# Patient Record
Sex: Female | Born: 1980 | Race: White | Hispanic: No | Smoking: Never smoker
Health system: Southern US, Community
[De-identification: ages and names within clinical notes are randomized; demographics above are authoritative.]

## PROBLEM LIST (undated history)

## (undated) DIAGNOSIS — F419 Anxiety disorder, unspecified: Secondary | ICD-10-CM

## (undated) DIAGNOSIS — G4730 Sleep apnea, unspecified: Secondary | ICD-10-CM

## (undated) DIAGNOSIS — R51 Headache: Secondary | ICD-10-CM

## (undated) DIAGNOSIS — E785 Hyperlipidemia, unspecified: Secondary | ICD-10-CM

## (undated) DIAGNOSIS — I10 Essential (primary) hypertension: Secondary | ICD-10-CM

## (undated) DIAGNOSIS — R519 Headache, unspecified: Secondary | ICD-10-CM

## (undated) DIAGNOSIS — E1159 Type 2 diabetes mellitus with other circulatory complications: Secondary | ICD-10-CM

## (undated) DIAGNOSIS — K219 Gastro-esophageal reflux disease without esophagitis: Secondary | ICD-10-CM

## (undated) DIAGNOSIS — Z794 Long term (current) use of insulin: Secondary | ICD-10-CM

## (undated) DIAGNOSIS — F32A Depression, unspecified: Secondary | ICD-10-CM

## (undated) DIAGNOSIS — N289 Disorder of kidney and ureter, unspecified: Secondary | ICD-10-CM

## (undated) DIAGNOSIS — E119 Type 2 diabetes mellitus without complications: Secondary | ICD-10-CM

## (undated) DIAGNOSIS — I152 Hypertension secondary to endocrine disorders: Secondary | ICD-10-CM

## (undated) DIAGNOSIS — M1990 Unspecified osteoarthritis, unspecified site: Secondary | ICD-10-CM

## (undated) DIAGNOSIS — M419 Scoliosis, unspecified: Secondary | ICD-10-CM

## (undated) HISTORY — PX: OTHER SURGICAL HISTORY: SHX169

## (undated) HISTORY — PX: LUMBAR FUSION: SHX111

## (undated) HISTORY — DX: Depression, unspecified: F32.A

## (undated) HISTORY — PX: HIP SURGERY: SHX245

## (undated) HISTORY — DX: Anxiety disorder, unspecified: F41.9

## (undated) HISTORY — PX: CERVICAL FUSION: SHX112

## (undated) HISTORY — PX: BACK SURGERY: SHX140

---

## 1999-12-12 ENCOUNTER — Encounter: Payer: Self-pay | Admitting: Neurosurgery

## 1999-12-12 ENCOUNTER — Encounter: Admission: RE | Admit: 1999-12-12 | Discharge: 1999-12-12 | Payer: Self-pay | Admitting: Neurosurgery

## 1999-12-19 ENCOUNTER — Encounter: Payer: Self-pay | Admitting: Neurosurgery

## 1999-12-19 ENCOUNTER — Ambulatory Visit (HOSPITAL_COMMUNITY): Admission: RE | Admit: 1999-12-19 | Discharge: 1999-12-19 | Payer: Self-pay | Admitting: Neurosurgery

## 2000-01-29 ENCOUNTER — Encounter: Payer: Self-pay | Admitting: Neurosurgery

## 2000-02-02 ENCOUNTER — Inpatient Hospital Stay (HOSPITAL_COMMUNITY): Admission: RE | Admit: 2000-02-02 | Discharge: 2000-02-06 | Payer: Self-pay | Admitting: Neurosurgery

## 2000-03-14 ENCOUNTER — Encounter: Admission: RE | Admit: 2000-03-14 | Discharge: 2000-03-14 | Payer: Self-pay | Admitting: Infectious Diseases

## 2001-12-13 ENCOUNTER — Emergency Department (HOSPITAL_COMMUNITY): Admission: EM | Admit: 2001-12-13 | Discharge: 2001-12-13 | Payer: Self-pay | Admitting: Emergency Medicine

## 2002-11-17 ENCOUNTER — Emergency Department (HOSPITAL_COMMUNITY): Admission: EM | Admit: 2002-11-17 | Discharge: 2002-11-17 | Payer: Self-pay | Admitting: Emergency Medicine

## 2004-11-28 ENCOUNTER — Emergency Department (HOSPITAL_COMMUNITY): Admission: EM | Admit: 2004-11-28 | Discharge: 2004-11-29 | Payer: Self-pay | Admitting: Emergency Medicine

## 2005-02-24 ENCOUNTER — Emergency Department (HOSPITAL_COMMUNITY): Admission: EM | Admit: 2005-02-24 | Discharge: 2005-02-24 | Payer: Self-pay | Admitting: Family Medicine

## 2005-06-14 ENCOUNTER — Ambulatory Visit: Payer: Self-pay | Admitting: Family Medicine

## 2006-02-25 ENCOUNTER — Emergency Department (HOSPITAL_COMMUNITY): Admission: EM | Admit: 2006-02-25 | Discharge: 2006-02-25 | Payer: Self-pay | Admitting: Emergency Medicine

## 2006-04-28 ENCOUNTER — Ambulatory Visit (HOSPITAL_COMMUNITY): Admission: RE | Admit: 2006-04-28 | Discharge: 2006-04-28 | Payer: Self-pay | Admitting: Obstetrics & Gynecology

## 2006-06-13 ENCOUNTER — Ambulatory Visit (HOSPITAL_COMMUNITY): Admission: RE | Admit: 2006-06-13 | Discharge: 2006-06-13 | Payer: Self-pay | Admitting: Obstetrics & Gynecology

## 2006-07-04 ENCOUNTER — Encounter (INDEPENDENT_AMBULATORY_CARE_PROVIDER_SITE_OTHER): Payer: Self-pay | Admitting: *Deleted

## 2006-07-04 ENCOUNTER — Ambulatory Visit (HOSPITAL_COMMUNITY): Admission: RE | Admit: 2006-07-04 | Discharge: 2006-07-04 | Payer: Self-pay | Admitting: Obstetrics & Gynecology

## 2006-07-12 ENCOUNTER — Inpatient Hospital Stay (HOSPITAL_COMMUNITY): Admission: AD | Admit: 2006-07-12 | Discharge: 2006-07-14 | Payer: Self-pay | Admitting: Obstetrics

## 2006-11-11 ENCOUNTER — Ambulatory Visit: Payer: Self-pay | Admitting: Family Medicine

## 2006-12-23 ENCOUNTER — Ambulatory Visit: Payer: Self-pay | Admitting: Family Medicine

## 2006-12-25 ENCOUNTER — Ambulatory Visit: Payer: Self-pay | Admitting: Family Medicine

## 2007-01-31 ENCOUNTER — Inpatient Hospital Stay (HOSPITAL_COMMUNITY): Admission: AD | Admit: 2007-01-31 | Discharge: 2007-02-01 | Payer: Self-pay | Admitting: Obstetrics & Gynecology

## 2007-04-13 ENCOUNTER — Encounter (INDEPENDENT_AMBULATORY_CARE_PROVIDER_SITE_OTHER): Payer: Self-pay | Admitting: Family Medicine

## 2007-04-17 ENCOUNTER — Encounter: Admission: RE | Admit: 2007-04-17 | Discharge: 2007-04-17 | Payer: Self-pay | Admitting: Neurosurgery

## 2007-06-19 ENCOUNTER — Encounter (INDEPENDENT_AMBULATORY_CARE_PROVIDER_SITE_OTHER): Payer: Self-pay | Admitting: Family Medicine

## 2007-10-19 ENCOUNTER — Emergency Department (HOSPITAL_COMMUNITY): Admission: EM | Admit: 2007-10-19 | Discharge: 2007-10-19 | Payer: Self-pay | Admitting: Emergency Medicine

## 2007-11-05 ENCOUNTER — Emergency Department (HOSPITAL_COMMUNITY): Admission: EM | Admit: 2007-11-05 | Discharge: 2007-11-05 | Payer: Self-pay | Admitting: Family Medicine

## 2007-12-22 ENCOUNTER — Telehealth (INDEPENDENT_AMBULATORY_CARE_PROVIDER_SITE_OTHER): Payer: Self-pay | Admitting: Family Medicine

## 2008-01-25 ENCOUNTER — Encounter
Admission: RE | Admit: 2008-01-25 | Discharge: 2008-01-25 | Payer: Self-pay | Admitting: Physical Medicine and Rehabilitation

## 2008-02-17 ENCOUNTER — Encounter (INDEPENDENT_AMBULATORY_CARE_PROVIDER_SITE_OTHER): Payer: Self-pay | Admitting: Family Medicine

## 2008-03-18 ENCOUNTER — Telehealth (INDEPENDENT_AMBULATORY_CARE_PROVIDER_SITE_OTHER): Payer: Self-pay | Admitting: Family Medicine

## 2008-04-18 ENCOUNTER — Ambulatory Visit: Payer: Self-pay | Admitting: Family Medicine

## 2008-04-18 ENCOUNTER — Telehealth (INDEPENDENT_AMBULATORY_CARE_PROVIDER_SITE_OTHER): Payer: Self-pay | Admitting: *Deleted

## 2008-04-18 DIAGNOSIS — F329 Major depressive disorder, single episode, unspecified: Secondary | ICD-10-CM | POA: Insufficient documentation

## 2008-04-18 DIAGNOSIS — F32A Depression, unspecified: Secondary | ICD-10-CM | POA: Insufficient documentation

## 2008-04-18 DIAGNOSIS — E119 Type 2 diabetes mellitus without complications: Secondary | ICD-10-CM | POA: Insufficient documentation

## 2008-04-18 DIAGNOSIS — E1165 Type 2 diabetes mellitus with hyperglycemia: Secondary | ICD-10-CM

## 2008-04-18 DIAGNOSIS — E1129 Type 2 diabetes mellitus with other diabetic kidney complication: Secondary | ICD-10-CM | POA: Insufficient documentation

## 2008-04-18 DIAGNOSIS — I10 Essential (primary) hypertension: Secondary | ICD-10-CM

## 2008-04-18 DIAGNOSIS — F332 Major depressive disorder, recurrent severe without psychotic features: Secondary | ICD-10-CM | POA: Insufficient documentation

## 2008-04-18 LAB — CONVERTED CEMR LAB
ALT: 53 units/L — ABNORMAL HIGH (ref 0–35)
AST: 33 units/L (ref 0–37)
Albumin: 4.5 g/dL (ref 3.5–5.2)
Alkaline Phosphatase: 62 units/L (ref 39–117)
BUN: 13 mg/dL (ref 6–23)
Basophils Relative: 0 % (ref 0–1)
Blood Glucose, Fingerstick: 271
CO2: 23 meq/L (ref 19–32)
Calcium: 9.3 mg/dL (ref 8.4–10.5)
Eosinophils Absolute: 0.1 10*3/uL (ref 0.0–0.7)
Eosinophils Relative: 2 % (ref 0–5)
HDL: 41 mg/dL (ref >39)
Hemoglobin: 14.1 g/dL (ref 12.0–15.0)
Hgb A1c MFr Bld: 9 %
Lymphs Abs: 2.6 10*3/uL (ref 0.7–4.0)
MCV: 92.2 fL (ref 78.0–100.0)
Monocytes Absolute: 0.4 10*3/uL (ref 0.1–1.0)
Monocytes Relative: 7 % (ref 3–12)
Neutro Abs: 3 10*3/uL (ref 1.7–7.7)
Potassium: 4.5 meq/L (ref 3.5–5.3)

## 2008-05-02 ENCOUNTER — Emergency Department (HOSPITAL_COMMUNITY): Admission: EM | Admit: 2008-05-02 | Discharge: 2008-05-02 | Payer: Self-pay | Admitting: Emergency Medicine

## 2008-05-06 ENCOUNTER — Encounter (INDEPENDENT_AMBULATORY_CARE_PROVIDER_SITE_OTHER): Payer: Self-pay | Admitting: Family Medicine

## 2008-05-10 ENCOUNTER — Encounter (INDEPENDENT_AMBULATORY_CARE_PROVIDER_SITE_OTHER): Payer: Self-pay | Admitting: Family Medicine

## 2008-05-10 ENCOUNTER — Ambulatory Visit (HOSPITAL_COMMUNITY): Admission: RE | Admit: 2008-05-10 | Discharge: 2008-05-10 | Payer: Self-pay | Admitting: Obstetrics & Gynecology

## 2008-05-17 ENCOUNTER — Ambulatory Visit: Payer: Self-pay | Admitting: Oncology

## 2008-05-18 ENCOUNTER — Telehealth (INDEPENDENT_AMBULATORY_CARE_PROVIDER_SITE_OTHER): Payer: Self-pay | Admitting: Family Medicine

## 2008-05-18 ENCOUNTER — Ambulatory Visit: Payer: Self-pay | Admitting: Family Medicine

## 2008-05-18 DIAGNOSIS — M4120 Other idiopathic scoliosis, site unspecified: Secondary | ICD-10-CM | POA: Insufficient documentation

## 2008-05-19 ENCOUNTER — Ambulatory Visit (HOSPITAL_COMMUNITY): Admission: RE | Admit: 2008-05-19 | Discharge: 2008-05-19 | Payer: Self-pay | Admitting: Family Medicine

## 2008-05-20 ENCOUNTER — Telehealth (INDEPENDENT_AMBULATORY_CARE_PROVIDER_SITE_OTHER): Payer: Self-pay | Admitting: Family Medicine

## 2008-05-24 ENCOUNTER — Encounter (INDEPENDENT_AMBULATORY_CARE_PROVIDER_SITE_OTHER): Payer: Self-pay | Admitting: Family Medicine

## 2008-05-31 ENCOUNTER — Encounter (INDEPENDENT_AMBULATORY_CARE_PROVIDER_SITE_OTHER): Payer: Self-pay | Admitting: Family Medicine

## 2008-06-02 ENCOUNTER — Telehealth (INDEPENDENT_AMBULATORY_CARE_PROVIDER_SITE_OTHER): Payer: Self-pay | Admitting: Family Medicine

## 2008-06-17 ENCOUNTER — Telehealth (INDEPENDENT_AMBULATORY_CARE_PROVIDER_SITE_OTHER): Payer: Self-pay | Admitting: *Deleted

## 2008-06-28 ENCOUNTER — Encounter (INDEPENDENT_AMBULATORY_CARE_PROVIDER_SITE_OTHER): Payer: Self-pay | Admitting: Family Medicine

## 2008-07-18 ENCOUNTER — Encounter (INDEPENDENT_AMBULATORY_CARE_PROVIDER_SITE_OTHER): Payer: Self-pay | Admitting: Family Medicine

## 2008-08-04 ENCOUNTER — Encounter (INDEPENDENT_AMBULATORY_CARE_PROVIDER_SITE_OTHER): Payer: Self-pay | Admitting: Family Medicine

## 2008-08-26 ENCOUNTER — Ambulatory Visit: Admission: RE | Admit: 2008-08-26 | Discharge: 2008-08-26 | Payer: Self-pay | Admitting: Neurosurgery

## 2008-09-02 ENCOUNTER — Emergency Department (HOSPITAL_COMMUNITY): Admission: EM | Admit: 2008-09-02 | Discharge: 2008-09-02 | Payer: Self-pay | Admitting: Emergency Medicine

## 2008-09-09 ENCOUNTER — Ambulatory Visit (HOSPITAL_COMMUNITY): Admission: RE | Admit: 2008-09-09 | Discharge: 2008-09-09 | Payer: Self-pay | Admitting: Neurosurgery

## 2008-09-23 ENCOUNTER — Inpatient Hospital Stay (HOSPITAL_COMMUNITY): Admission: RE | Admit: 2008-09-23 | Discharge: 2008-09-30 | Payer: Self-pay | Admitting: Neurosurgery

## 2008-10-06 ENCOUNTER — Encounter (INDEPENDENT_AMBULATORY_CARE_PROVIDER_SITE_OTHER): Payer: Self-pay | Admitting: Family Medicine

## 2008-12-15 ENCOUNTER — Encounter (INDEPENDENT_AMBULATORY_CARE_PROVIDER_SITE_OTHER): Payer: Self-pay | Admitting: Family Medicine

## 2008-12-28 ENCOUNTER — Encounter (INDEPENDENT_AMBULATORY_CARE_PROVIDER_SITE_OTHER): Payer: Self-pay | Admitting: Family Medicine

## 2009-03-10 ENCOUNTER — Telehealth (INDEPENDENT_AMBULATORY_CARE_PROVIDER_SITE_OTHER): Payer: Self-pay | Admitting: Internal Medicine

## 2009-05-01 ENCOUNTER — Ambulatory Visit: Payer: Self-pay | Admitting: Nurse Practitioner

## 2009-05-01 DIAGNOSIS — G4700 Insomnia, unspecified: Secondary | ICD-10-CM

## 2009-05-01 LAB — CONVERTED CEMR LAB
Bilirubin Urine: NEGATIVE
Blood in Urine, dipstick: NEGATIVE
Ketones, urine, test strip: NEGATIVE
Nitrite: NEGATIVE
Urobilinogen, UA: 0.2
WBC Urine, dipstick: NEGATIVE

## 2009-05-02 DIAGNOSIS — R809 Proteinuria, unspecified: Secondary | ICD-10-CM | POA: Insufficient documentation

## 2009-05-02 DIAGNOSIS — E781 Pure hyperglyceridemia: Secondary | ICD-10-CM | POA: Insufficient documentation

## 2009-05-02 LAB — CONVERTED CEMR LAB
Alkaline Phosphatase: 59 units/L (ref 39–117)
BUN: 11 mg/dL (ref 6–23)
CO2: 21 meq/L (ref 19–32)
Calcium: 9.1 mg/dL (ref 8.4–10.5)
Chloride: 104 meq/L (ref 96–112)
Cholesterol: 209 mg/dL — ABNORMAL HIGH (ref 0–200)
Creatinine, Ser: 0.58 mg/dL (ref 0.40–1.20)
Glucose, Bld: 263 mg/dL — ABNORMAL HIGH (ref 70–99)
HCT: 39.1 % (ref 36.0–46.0)
Lymphocytes Relative: 46 % (ref 12–46)
Lymphs Abs: 2.8 10*3/uL (ref 0.7–4.0)
MCHC: 31.5 g/dL (ref 30.0–36.0)
MCV: 93.8 fL (ref 78.0–100.0)
Microalb, Ur: 4.59 mg/dL — ABNORMAL HIGH (ref 0.00–1.89)
Neutrophils Relative %: 43 % (ref 43–77)
RBC: 4.17 M/uL (ref 3.87–5.11)
RDW: 14.8 % (ref 11.5–15.5)
Total Bilirubin: 0.3 mg/dL (ref 0.3–1.2)
Triglycerides: 513 mg/dL — ABNORMAL HIGH (ref <150)

## 2009-05-23 ENCOUNTER — Encounter (INDEPENDENT_AMBULATORY_CARE_PROVIDER_SITE_OTHER): Payer: Self-pay | Admitting: Nurse Practitioner

## 2009-08-21 ENCOUNTER — Encounter (INDEPENDENT_AMBULATORY_CARE_PROVIDER_SITE_OTHER): Payer: Self-pay | Admitting: Nurse Practitioner

## 2009-12-11 ENCOUNTER — Telehealth (INDEPENDENT_AMBULATORY_CARE_PROVIDER_SITE_OTHER): Payer: Self-pay | Admitting: Nurse Practitioner

## 2009-12-11 ENCOUNTER — Encounter (INDEPENDENT_AMBULATORY_CARE_PROVIDER_SITE_OTHER): Payer: Self-pay | Admitting: *Deleted

## 2010-01-25 ENCOUNTER — Encounter (INDEPENDENT_AMBULATORY_CARE_PROVIDER_SITE_OTHER): Payer: Self-pay | Admitting: *Deleted

## 2010-01-25 ENCOUNTER — Telehealth (INDEPENDENT_AMBULATORY_CARE_PROVIDER_SITE_OTHER): Payer: Self-pay | Admitting: Nurse Practitioner

## 2010-03-19 ENCOUNTER — Encounter (INDEPENDENT_AMBULATORY_CARE_PROVIDER_SITE_OTHER): Payer: Self-pay | Admitting: Nurse Practitioner

## 2010-07-04 ENCOUNTER — Encounter (INDEPENDENT_AMBULATORY_CARE_PROVIDER_SITE_OTHER): Payer: Self-pay | Admitting: Nurse Practitioner

## 2010-07-19 ENCOUNTER — Encounter
Admission: RE | Admit: 2010-07-19 | Discharge: 2010-07-19 | Payer: Self-pay | Source: Home / Self Care | Attending: Neurosurgery | Admitting: Neurosurgery

## 2010-08-19 ENCOUNTER — Encounter: Payer: Self-pay | Admitting: Obstetrics & Gynecology

## 2010-08-28 NOTE — Letter (Signed)
Summary: [facility]   [facility]   Imported By: Roland Earl 11/06/2009 14:52:44  _____________________________________________________________________  External Attachment:    Type:   Image     Comment:   External Document

## 2010-08-28 NOTE — Letter (Signed)
Summary: REQUESTING RECORDS FROM DR.LISA JACKSON-MOORE  REQUESTING RECORDS FROM DR.LISA JACKSON-MOORE   Imported By: Adela Lank [place] 06/02/2008 15:07:41  _____________________________________________________________________  External Attachment:    Type:   Image     Comment:   External Document

## 2010-08-28 NOTE — Letter (Signed)
Summary: Generic Letter  [facility]  [address] [place], [place] [address]   Phone: [phone]  Fax: [phone]    12/11/2009  LEMA STIDHAM [place],   [address]  Dear Ms. Vanlue,  We have been unable to reach you by telephone.  Please contact our office, as you need to schedule an appointment for a fasting lab and doctor visit.  As a diabetic, you need to be seen every six months.    Your cholesterol meds have been filled for one month.  Are you currently being treated by an endocrinologist?  This is information that we need for your records.   Sincerely,   Sherian Maroon RN

## 2010-08-28 NOTE — Letter (Signed)
Summary: Generic Letter  [facility]  [address] [place], [place] [address]   Phone: [phone]  Fax: [phone]    01/25/2010  Briana Williams, [place]  [address]  Dear Ms. Trovato,  We have been unable to contact you by telephone.  Please call our office, at your earliest convenience, so that we may speak with you.   Sincerely,   Sherian Maroon RN

## 2010-08-28 NOTE — Progress Notes (Signed)
Summary: Needs lab & MD visit  Phone Note Outgoing Call   Summary of Call: Previous Rankins pt cholesterol meds refilled for an additional month advise pt that she needs an office visit - she is a diabetic (need every 6 months visits) AND she also needs  lipids/cmp fasting labs.  is pt going to the endocrinologist? Initial call taken by: Aurora Mask FNP,  Dec 11, 2009 8:22 AM  Follow-up for Phone Call        Attempted to call pt. -- both numbers not in service.  Will mail letter.   Follow-up by: Sherian Maroon RN,  Dec 11, 2009 2:52 PM

## 2010-08-28 NOTE — Letter (Signed)
Summary: [facility]  [facility]   Imported By: Roland Earl 03/26/2010 14:25:22  _____________________________________________________________________  External Attachment:    Type:   Image     Comment:   External Document

## 2010-08-28 NOTE — Progress Notes (Signed)
Summary: Needs to see provider before further refills  Phone Note Outgoing Call   Call placed by: Sherian Maroon RN,  January 25, 2010 3:19 PM Call placed to: Patient Summary of Call: Refill Rx denied -- pt. needs to see provider. Unable to contact pt. by phone -- phone disconnected.  Letter sent.  Sherian Maroon RN  January 25, 2010 3:20 PM

## 2010-08-30 NOTE — Letter (Signed)
Summary: [facility]  [facility]   Imported By: Roland Earl 07/25/2010 14:51:18  _____________________________________________________________________  External Attachment:    Type:   Image     Comment:   External Document

## 2010-10-04 ENCOUNTER — Encounter: Payer: Self-pay | Admitting: Nurse Practitioner

## 2010-10-04 ENCOUNTER — Encounter (INDEPENDENT_AMBULATORY_CARE_PROVIDER_SITE_OTHER): Payer: Self-pay | Admitting: Nurse Practitioner

## 2010-10-04 DIAGNOSIS — H538 Other visual disturbances: Secondary | ICD-10-CM | POA: Insufficient documentation

## 2010-10-04 LAB — CONVERTED CEMR LAB
Bilirubin Urine: NEGATIVE
Blood Glucose, Fingerstick: 367
Specific Gravity, Urine: 1.015
Urobilinogen, UA: 0.2
pH: 5

## 2010-10-05 ENCOUNTER — Encounter (INDEPENDENT_AMBULATORY_CARE_PROVIDER_SITE_OTHER): Payer: Self-pay | Admitting: Nurse Practitioner

## 2010-10-05 ENCOUNTER — Encounter (INDEPENDENT_AMBULATORY_CARE_PROVIDER_SITE_OTHER): Payer: Self-pay | Admitting: *Deleted

## 2010-10-05 ENCOUNTER — Telehealth (INDEPENDENT_AMBULATORY_CARE_PROVIDER_SITE_OTHER): Payer: Self-pay | Admitting: Nurse Practitioner

## 2010-10-05 LAB — CONVERTED CEMR LAB
ALT: 48 units/L — ABNORMAL HIGH (ref 0–35)
Albumin: 4.7 g/dL (ref 3.5–5.2)
Basophils Absolute: 0 10*3/uL (ref 0.0–0.1)
Basophils Relative: 0 % (ref 0–1)
Chloride: 98 meq/L (ref 96–112)
Creatinine, Ser: 0.61 mg/dL (ref 0.40–1.20)
Eosinophils Relative: 2 % (ref 0–5)
HCT: 42.5 % (ref 36.0–46.0)
HDL: 31 mg/dL — ABNORMAL LOW (ref >39)
MCHC: 32.7 g/dL (ref 30.0–36.0)
Microalb, Ur: 21.17 mg/dL — ABNORMAL HIGH (ref 0.00–1.89)
Monocytes Absolute: 0.5 10*3/uL (ref 0.1–1.0)
Neutrophils Relative %: 56 % (ref 43–77)
RBC: 4.72 M/uL (ref 3.87–5.11)
RDW: 15.1 % (ref 11.5–15.5)
Total Bilirubin: 0.9 mg/dL (ref 0.3–1.2)
Total CHOL/HDL Ratio: 7.2
Triglycerides: 994 mg/dL — ABNORMAL HIGH (ref <150)

## 2010-10-09 NOTE — Progress Notes (Signed)
Summary: Lab results  Phone Note Outgoing Call   Summary of Call: notify pt that as expected her blood sugar is high Hgba1c = 10.3 and should be less than 7.  Advise pt to follow up with her endocrinologist - Dr. Buddy Duty ASAP to get blood sugar under control. The elevated blood sugar is affecting her kidneys. Also her cholesterol/triglycerides are high.  She will need to restart on gemfibrozil (she was on it before but I did not restart during office visit because i wanted to see the labs first).  she should avoid fried, fatty foods. Advise pt to keep appt with this provider in 2 weeks (she should have made appt before she left) Initial call taken by: Aurora Mask FNP,  October 05, 2010 8:48 AM  Follow-up for Phone Call        called [phone] this line is not in service. Briana Williams  October 05, 2010 2:49 PM  Will mail letter.     New/Updated Medications: GEMFIBROZIL 600 MG TABS (GEMFIBROZIL) One tablet by mouth two times a day 30 minutes before meals Prescriptions: GEMFIBROZIL 600 MG TABS (GEMFIBROZIL) One tablet by mouth two times a day 30 minutes before meals  #60 x 5   Entered and Authorized by:   Aurora Mask FNP   Signed by:   Aurora Mask FNP on 10/05/2010   Method used:   Printed then faxed to ...       [facility] [address] [phone]* (retail)       [address]       [place], [place]  [address]       Ph: [phone] or [phone]       Fax: [phone]   [place]:   [phone]  Phone Note Outgoing Call   Summary of Call: notify pt that as expected her blood sugar is high Hgba1c = 10.3 and should be less than 7.  Advise pt to follow up with her endocrinologist - Dr. Buddy Duty ASAP to get blood sugar under control. The elevated blood sugar is affecting her kidneys. Also her cholesterol/triglycerides are high.  She will need to restart on gemfibrozil (she was on it before but I did not restart during office visit because i wanted to see the labs  first).  she should avoid fried, fatty foods. Advise pt to keep appt with this provider in 2 weeks (she should have made appt before she left) Initial call taken by: Aurora Mask FNP,  October 05, 2010 8:48 AM  Follow-up for Phone Call        called [phone] this line is not in service. Briana Williams  October 05, 2010 2:49 PM  Will mail letter.     New/Updated Medications: GEMFIBROZIL 600 MG TABS (GEMFIBROZIL) One tablet by mouth two times a day 30 minutes before meals

## 2010-10-09 NOTE — Letter (Signed)
Summary: REFERRAL//OPHTHALMOLOGY  REFERRAL//OPHTHALMOLOGY   Imported By: Roland Earl 10/05/2010 11:14:32  _____________________________________________________________________  External Attachment:    Type:   Image     Comment:   External Document

## 2010-10-09 NOTE — Letter (Signed)
Summary: *HSN Results Follow up  [facility]  [address] [place], [place] [address]   Phone: [phone]  Fax: [phone]      10/05/2010   AZALEYA STROUSS [facility] [place], [place]  [address]   Dear  Ms. Shenekia Cuthrell,                            ____S.Drinkard,FNP   ____D. Gore,FNP       ____B. McPherson,MD   ____V. Rankins,MD    ____E. Mulberry,MD    ____N. Hassell Done, FNP  ____D. Jobe Igo, MD    ____K. Tomma Lightning, MD    ____Other     This letter is to inform you that your recent test(s):    __X_____Lab Test Results       _______ is within acceptable limits  _______ requires a medication change  _______ requires a follow-up lab visit  _______ requires a follow-up visit with your provider   Comments:  We have been trying to reach you at [phone].  Please contact the office at your earliest convenience.       _________________________________________________________ If you have any questions, please contact our office                     Sincerely,  Isla Pence [facility]

## 2010-10-09 NOTE — Assessment & Plan Note (Signed)
Summary: Diabetes/HTN   Vital Signs:  Patient profile:   30 year old female LMP:     08/2010 Height:      58.50 inches Weight:      262.5 pounds BMI:     54.12 Temp:     98.3 degrees F oral Pulse rate:   98 / minute Pulse rhythm:   regular Resp:     20 per minute BP sitting:   133 / 86  (left arm) Cuff size:   large  Vitals Entered By: Isla Pence (October 04, 2010 9:47 AM)  Nutrition Counseling: Patient's BMI is greater than 25 and therefore counseled on weight management options. CC: follow-up visit, Hypertension Management, Depression Is Patient Diabetic? Yes Pain Assessment Patient in pain? no      CBG Result 367  Does patient need assistance? Functional Status Self care Ambulation Normal LMP (date): 08/2010     Enter LMP: 08/2010   CC:  follow-up visit, Hypertension Management, and Depression.  History of Present Illness:  Pt into the office for f/u - last visit  over 1 year ago. Husband present today also to be seen as a pt  Eye exam - Pt is diabetic and would like an eye exam.  Last eye exam was many years ago.  No current glasses.  c/o of blurry vision "feels like there is a cloud".    Obesity - down 13 pounds since the last visit.  She is trying  to increase her activity in an effort to lose weight  Depression History:      The patient is having a depressed mood most of the day and has a diminished interest in her usual daily activities.  Positive alarm features for depression include insomnia and fatigue (loss of energy).  However, she denies recurrent thoughts of death or suicide.        Psychosocial stress factors include major life changes.  The patient denies that she feels like life is not worth living, denies that she wishes that she were dead, and denies that she has thought about ending her life.        Comments:  Pt has NOT been taking zoloft as previously ordered by this office.  She is taking alproazolam as ordered by Dr. Carloyn Manner.  Depression  Treatment History:  Prior Medication Used:   Start Date: Assessment of Effect:   Comments:  Zoloft (sertraline)     05/01/2009     --       restart meds  Diabetes Management History:      The patient is a 30 years old female who comes in for evaluation of DM Type 2.  She has not been enrolled in the "Diabetic Education Program".  She states lack of understanding of dietary principles and is not following her diet appropriately.  No sensory loss is reported.  Self foot exams are not being performed.  She is not checking home blood sugars.  She says that she is not exercising regularly.        Hypoglycemic symptoms are not occurring.  No hyperglycemic symptoms are reported.        Symptoms which suggest diabetic complications include vision problems.  Other questions/concerns include: ne recent eye exams.  No changes have been made to her treatment plan since last visit.    Hypertension History:      She denies headache, chest pain, and palpitations.  She notes no problems with any antihypertensive medication side effects.  Positive major cardiovascular risk factors include diabetes, hyperlipidemia, and hypertension.  Negative major cardiovascular risk factors include female age less than 3 years old and non-tobacco-user status.        Further assessment for target organ damage reveals no history of ASHD, cardiac end-organ damage (CHF/LVH), stroke/TIA, peripheral vascular disease, renal insufficiency, or hypertensive retinopathy.      Habits & Providers  Alcohol-Tobacco-Diet     Alcohol drinks/day: 0     Tobacco Status: never  Exercise-Depression-Behavior     Does Patient Exercise: no     Have you felt down or hopeless? yes     Have you felt little pleasure in things? yes     Depression Counseling: further diagnostic testing and/or other treatment is indicated     Drug Use: no  Allergies (verified): No Known Drug Allergies  Social History: Does Patient Exercise:  no  Review  of Systems General:  Denies fever. Eyes:  Complains of blurring. CV:  Complains of chest pain or discomfort; left foot swelling that started after her neck surgery chest pain - usually after eating foods such as spicy foods, tachos. GI:  Denies abdominal pain. GU:  Complains of urinary frequency; denies dysuria; odorous urine. MS:  Complains of joint pain and stiffness; neck. Endo:  Complains of excessive urination.  Physical Exam  General:  alert and overweight-appearing.   Head:  normocephalic.   Eyes:  pupils round.   Lungs:  normal breath sounds.   Heart:  normal rate and regular rhythm.   Abdomen:  normal bowel sounds.   Neurologic:  alert & oriented X3.   Skin:  color normal.   Psych:  Oriented X3.    Diabetes Management Exam:       Nails:          Left foot: normal          Right foot: normal   Impression & Recommendations:  Problem # 1:  DIABETES MELLITUS, TYPE II, ON INSULIN (ICD-250.00) Meds adjusted by Dr. Buddy Duty - she has previously been seen by endocrinology and will make her own appt for f/u. she has some meds and is taking as ordered Her updated medication list for this problem includes:    Metformin Hcl 1000 Mg Tabs (Metformin hcl) .Marland Kitchen... 1 by mouth two times a day for diabetes **rx by dr. Buddy Duty**    Novolog 100 Unit/ml Soln (Insulin aspart) .Marland Kitchen... 12 units subcutaneously three times a day before meals **rx by dr. Buddy Duty**    Levemir 100 Unit/ml Soln (Insulin detemir) .Marland Kitchen... 22 units subcutaneously nightly **rx by dr. Buddy Duty**    Lisinopril 10 Mg Tabs (Lisinopril) ..... One tablet by mouth nightly for kidneys/blood pressure  Orders: Capillary Blood Glucose/CBG [id number]) UA Dipstick w/o Micro (manual) (81002) T-Urine Microalbumin w/creat. ratio [phone]) T-Comprehensive Metabolic Panel ([id number]) T- Hemoglobin A1C [id number]) Ophthalmology Referral (Ophthalmology)  Problem # 2:  ESSENTIAL HYPERTENSION, BENIGN (ICD-401.1)  Her updated medication list  for this problem includes:    Lisinopril 10 Mg Tabs (Lisinopril) ..... One tablet by mouth nightly for kidneys/blood pressure  Orders: T-CBC w/Diff ST:[id number])  Problem # 3:  DEPRESSION (ICD-311)  The following medications were removed from the medication list:    Trazodone Hcl 50 Mg Tabs (Trazodone hcl) ..... One tablet by mouth nightly as needed for sleep Her updated medication list for this problem includes:    Sertraline Hcl 50 Mg Tabs (Sertraline hcl) ..... One tablet by mouth nightly for mood    Alprazolam 0.5 Mg Tabs (  Alprazolam) ..... One tablet by mouth every 12 hours as needed **rx by dr. Carloyn Manner**  Problem # 4:  BLURRED VISION (ICD-368.8)  Orders: Ophthalmology Referral (Ophthalmology)  Complete Medication List: 1)  Metformin Hcl 1000 Mg Tabs (Metformin hcl) .Marland Kitchen.. 1 by mouth two times a day for diabetes **rx by dr. Buddy Duty** 2)  Novolog 100 Unit/ml Soln (Insulin aspart) .Marland Kitchen.. 12 units subcutaneously three times a day before meals **rx by dr. Buddy Duty** 3)  Levemir 100 Unit/ml Soln (Insulin detemir) .... 22 units subcutaneously nightly **rx by dr. Buddy Duty** 4)  Sertraline Hcl 50 Mg Tabs (Sertraline hcl) .... One tablet by mouth nightly for mood 5)  Lisinopril 10 Mg Tabs (Lisinopril) .... One tablet by mouth nightly for kidneys/blood pressure 6)  Onetouch Ultra Mini Glucometer Strips  .... Check cbgs three times a day and prn 7)  Gemfibrozil 600 Mg Tabs (Gemfibrozil) .... Hold 8)  Gabapentin 300 Mg Caps (Gabapentin) .... One tablet by mouth three times a day  **rx by dr. Carloyn Manner** 9)  Alprazolam 0.5 Mg Tabs (Alprazolam) .... One tablet by mouth every 12 hours as needed **rx by dr. Carloyn Manner** 10)  Hydrocodone-acetaminophen 10-325 Mg Tabs (Hydrocodone-acetaminophen) .Marland Kitchen.. 1 or 2 tablets every 6 hours as needed  **rx by dr. Carloyn Manner**  Other Orders: T-Lipid Profile [phone])  Diabetes Management Assessment/Plan:      Her blood pressure goal is < 130/80.    Hypertension Assessment/Plan:      The  patient's hypertensive risk group is category C: Target organ damage and/or diabetes.  Today's blood pressure is 133/86.  Her blood pressure goal is < 130/80.  Diabetic Foot Exam Foot Inspection Is there a history of a foot ulcer?              No Is there a foot ulcer now?              No Can the patient see the bottom of their feet?          Yes Are the shoes appropriate in style and fit?          Yes Is there swelling or an abnormal foot shape?          No Are the toenails long?                No Are the toenails thick?                No Are the toenails ingrown?              No Is there heavy callous build-up?              No Is there pain in the calf muscle (Intermittent claudication) when walking?    NoIs there a claw toe deformity?              No Is there elevated skin temperature?            No Is there limited ankle dorsiflexion?            No Is there foot or ankle muscle weakness?            No  Diabetic Foot Care Education Patient educated on appropriate care of diabetic feet.  Pulse Check          Right Foot          Left Foot Dorsalis Pedis:        normal  normal   Patient Instructions: 1)  Diabetes - You need to call and get an appointment with Dr. Buddy Duty AS SOON AS POSSIBLE for your diabetes. 2)  No infection in your urine but it has LOTS of Sugar.  You need to get your diabetes under better control.  Drink plenty of water instead of diet coke. 3)  Blood pressure/Kidneys - start on lisinopril 10mg  by mouth daily to help with kidneys and blood pressure 4)  You will be referred to an eye doctor.  Please be sure to keep the time/date of the appointment 5)  Mood - You have been off your long acting mood medication for some time. You have lots of increased stressors in your life.  Will restart sertralazine 50mg  by mouth nightly.  Will restart at a lower dose and increase 6)  Follow up in 2 weeks for medication review and diabetes and lab review. will need cbg, u/a,  PHQ-9 7)  Will address other issues on your list at next visit - chest pain, palpitations, dental visit. Prescriptions: LISINOPRIL 10 MG  TABS (LISINOPRIL) One tablet by mouth nightly for kidneys/blood pressure  #30 x 1   Entered and Authorized by:   Aurora Mask FNP   Signed by:   Aurora Mask FNP on 10/04/2010   Method used:   Print then Give to Patient   RxID:   [id number] SERTRALINE HCL 50 MG TABS (SERTRALINE HCL) One tablet by mouth nightly for mood  #30 x 1   Entered and Authorized by:   Aurora Mask FNP   Signed by:   Aurora Mask FNP on 10/04/2010   Method used:   Print then Give to Patient   RxID:   [id number]    Orders Added: 1)  Capillary Blood Glucose/CBG [id number] 2)  Est. Patient Level IV [id number] 3)  UA Dipstick w/o Micro (manual) [81002] 4)  T-Urine Microalbumin w/creat. ratio [[id number]] 5)  T-Comprehensive Metabolic Panel [id number] 6)  T-Lipid Profile [80061-22930] 7)  T- Hemoglobin A1C [83036-23375] 8)  T-CBC w/Diff [id number] 9)  Ophthalmology Referral [Ophthalmology]    Laboratory Results   Urine Tests  Date/Time Received: October 04, 2010 10:31 AM   Routine Urinalysis   Color: lt. yellow Glucose: >=1000   (Normal Range: Negative) Bilirubin: negative   (Normal Range: Negative) Ketone: small (15)   (Normal Range: Negative) Spec. Gravity: 1.015   (Normal Range: 1.003-1.035) Blood: negative   (Normal Range: Negative) pH: 5.0   (Normal Range: 5.0-8.0) Protein: negative   (Normal Range: Negative) Urobilinogen: 0.2   (Normal Range: 0-1) Nitrite: negative   (Normal Range: Negative) Leukocyte Esterace: negative   (Normal Range: Negative)     Blood Tests     CBG Random:: 367mg /dL

## 2010-10-09 NOTE — Letter (Signed)
Summary: *HSN Results Follow up  [facility]  [address] [place], [place] [address]   Phone: [phone]  Fax: [phone]      10/05/2010   Briana Williams [facility] [place], [place]  [address]   Dear  Ms. [name],                            ____S.Drinkard,FNP   ____D. Gore,FNP       ____B. McPherson,MD   ____V. Rankins,MD    ____E. Mulberry,MD    ____N. Hassell Done, FNP  ____D. Jobe Igo, MD    ____K. Tomma Lightning, MD    ____Other     This letter is to inform you that your recent test(s):  _______Pap Smear    _______Lab Test     _______X-ray    _______ is within acceptable limits  _______ requires a medication change  _______ requires a follow-up lab visit  _______ requires a follow-up visit with your provider   Comments:       _________________________________________________________ If you have any questions, please contact our office                     Sincerely,  Isla Pence [facility]

## 2010-11-08 LAB — GLUCOSE, CAPILLARY
Glucose-Capillary: 114 mg/dL — ABNORMAL HIGH (ref 70–99)
Glucose-Capillary: 124 mg/dL — ABNORMAL HIGH (ref 70–99)
Glucose-Capillary: 127 mg/dL — ABNORMAL HIGH (ref 70–99)
Glucose-Capillary: 138 mg/dL — ABNORMAL HIGH (ref 70–99)
Glucose-Capillary: 139 mg/dL — ABNORMAL HIGH (ref 70–99)
Glucose-Capillary: 140 mg/dL — ABNORMAL HIGH (ref 70–99)
Glucose-Capillary: 146 mg/dL — ABNORMAL HIGH (ref 70–99)
Glucose-Capillary: 149 mg/dL — ABNORMAL HIGH (ref 70–99)
Glucose-Capillary: 170 mg/dL — ABNORMAL HIGH (ref 70–99)

## 2010-11-12 LAB — PROTIME-INR
INR: 0.9 (ref 0.00–1.49)
Prothrombin Time: 12.7 seconds (ref 11.6–15.2)

## 2010-11-12 LAB — COMPREHENSIVE METABOLIC PANEL
ALT: 36 U/L — ABNORMAL HIGH (ref 0–35)
BUN: 7 mg/dL (ref 6–23)
CO2: 25 mEq/L (ref 19–32)
Calcium: 9.5 mg/dL (ref 8.4–10.5)
Creatinine, Ser: 0.56 mg/dL (ref 0.4–1.2)
GFR calc non Af Amer: >60 mL/min (ref >60)
Glucose, Bld: 89 mg/dL (ref 70–99)
Sodium: 138 mEq/L (ref 135–145)
Total Protein: 7.1 g/dL (ref 6.0–8.3)

## 2010-11-12 LAB — CBC
Hemoglobin: 12.4 g/dL (ref 12.0–15.0)
MCHC: 33.4 g/dL (ref 30.0–36.0)
MCV: 90.3 fL (ref 78.0–100.0)
RBC: 4.13 MIL/uL (ref 3.87–5.11)
RDW: 14.5 % (ref 11.5–15.5)

## 2010-11-12 LAB — DIFFERENTIAL
Eosinophils Absolute: 0.1 10*3/uL (ref 0.0–0.7)
Lymphocytes Relative: 40 % (ref 12–46)
Lymphs Abs: 2.4 10*3/uL (ref 0.7–4.0)
Monocytes Relative: 6 % (ref 3–12)
Neutro Abs: 3 10*3/uL (ref 1.7–7.7)
Neutrophils Relative %: 50 % (ref 43–77)

## 2010-11-12 LAB — URINALYSIS, ROUTINE W REFLEX MICROSCOPIC
Nitrite: NEGATIVE
Protein, ur: NEGATIVE mg/dL
Specific Gravity, Urine: 1.021 (ref 1.005–1.030)
Urobilinogen, UA: 0.2 mg/dL (ref 0.0–1.0)

## 2010-11-12 LAB — URINE MICROSCOPIC-ADD ON

## 2010-11-12 LAB — APTT: aPTT: 27 seconds (ref 24–37)

## 2010-11-13 LAB — GLUCOSE, CAPILLARY
Glucose-Capillary: 137 mg/dL — ABNORMAL HIGH (ref 70–99)
Glucose-Capillary: 154 mg/dL — ABNORMAL HIGH (ref 70–99)
Glucose-Capillary: 166 mg/dL — ABNORMAL HIGH (ref 70–99)
Glucose-Capillary: 211 mg/dL — ABNORMAL HIGH (ref 70–99)

## 2010-11-13 LAB — COMPREHENSIVE METABOLIC PANEL
ALT: 32 U/L (ref 0–35)
AST: 33 U/L (ref 0–37)
CO2: 27 mEq/L (ref 19–32)
Chloride: 103 mEq/L (ref 96–112)
GFR calc Af Amer: >60 mL/min (ref >60)
GFR calc non Af Amer: >60 mL/min (ref >60)
Glucose, Bld: 105 mg/dL — ABNORMAL HIGH (ref 70–99)
Sodium: 138 mEq/L (ref 135–145)
Total Bilirubin: 0.5 mg/dL (ref 0.3–1.2)

## 2010-11-13 LAB — BASIC METABOLIC PANEL
CO2: 25 mEq/L (ref 19–32)
Chloride: 102 mEq/L (ref 96–112)
Creatinine, Ser: 0.52 mg/dL (ref 0.4–1.2)
GFR calc Af Amer: >60 mL/min (ref >60)
Glucose, Bld: 104 mg/dL — ABNORMAL HIGH (ref 70–99)

## 2010-11-13 LAB — CBC
Hemoglobin: 12.5 g/dL (ref 12.0–15.0)
MCHC: 34.7 g/dL (ref 30.0–36.0)
MCV: 89.2 fL (ref 78.0–100.0)
MCV: 89.4 fL (ref 78.0–100.0)
RBC: 4.05 MIL/uL (ref 3.87–5.11)
RBC: 4.13 MIL/uL (ref 3.87–5.11)
WBC: 6.2 10*3/uL (ref 4.0–10.5)

## 2010-11-13 LAB — POCT RAPID STREP A (OFFICE): Streptococcus, Group A Screen (Direct): POSITIVE — AB

## 2010-11-13 LAB — DIFFERENTIAL
Basophils Absolute: 0 10*3/uL (ref 0.0–0.1)
Basophils Relative: 1 % (ref 0–1)
Eosinophils Absolute: 0.1 10*3/uL (ref 0.0–0.7)
Eosinophils Relative: 1 % (ref 0–5)

## 2010-11-13 LAB — URINALYSIS, ROUTINE W REFLEX MICROSCOPIC
Bilirubin Urine: NEGATIVE
Glucose, UA: NEGATIVE mg/dL
Hgb urine dipstick: NEGATIVE
Ketones, ur: NEGATIVE mg/dL
Protein, ur: NEGATIVE mg/dL

## 2010-11-13 LAB — PROTIME-INR: Prothrombin Time: 13.3 seconds (ref 11.6–15.2)

## 2010-12-11 NOTE — Discharge Summary (Signed)
[name], [name]:  [id number]   MEDICAL RECORD NO.:  [id number]          PATIENT TYPE:  INP   LOCATION:  3014                         FACILITY:  [facility]   PHYSICIAN:  Elizabeth Sauer, M.D.      DATE OF BIRTH:  [birth year]   DATE OF ADMISSION:  09/23/2008  DATE OF DISCHARGE:  09/30/2008                               DISCHARGE SUMMARY   ADMISSION DIAGNOSIS:  Cervical spondylosis.   DISCHARGE DIAGNOSIS:  Cervical spondylosis.   PROCEDURE:  C3-4, C4-5, and C5-6 anterior decompression and fusion.   BODY OF TEXT:  A 31 year old right-handed white girl whose history and  physical is recounted in chart.  She has congenital deformity of the  skull base as well as scoliosis that had prior fusion from C6 to S1, has  increasing pain in her neck and has severe spinal stenosis at C3-4, C4-  5, and C5-6 as well as some tightness of the posterior fossa with a  question of a pseudo-basilar invagination.  General exam was as noted in  the chart.  Neurologically, she was intact with Hoffmann bilaterally and  weakness of the grip.  She also has Lhermitte phenomenon.  She was  admitted after ascertaining normal laboratory values and underwent  anterior compression and fusion at C3-4, C4-5, and C5-6.  The attempt to  fuse this from the occiput to C6 was not undergone because body habits  would not allow prepping of the skin.  [name] is morbidly obese and  postoperatively, it has been extensively discussed with her the need to  lose weight, which she is going to work on.  Postoperatively, she has  actually done well.  Her Lhermitte phenomenon has gone away.  Her  strength is full.  Her biggest problem was dysphagia, which persisted  for 4-5 days and is now much better.  She is able eat solid food and  take her meds.  She has been discharged home with Percocet for pain.  Her followup will be in [facility] offices in about a week for suture  removal.     ______________________________  Elizabeth Sauer, M.D.     MWR/MEDQ  D:  09/30/2008  T:  09/30/2008  Job:  [id number]

## 2010-12-11 NOTE — Op Note (Signed)
NAMEKIKUYO, CRUMBY NO.:  [id number]   MEDICAL RECORD NO.:  [id number]          PATIENT TYPE:  INP   LOCATION:  3114                         FACILITY:  [facility]   PHYSICIAN:  Elizabeth Sauer, M.D.      DATE OF BIRTH:  12/24/1980   DATE OF PROCEDURE:  09/23/2008  DATE OF DISCHARGE:                               OPERATIVE REPORT   PREOPERATIVE DIAGNOSIS:  Cervical spondylosis with cord compression.   POSTOPERATIVE DIAGNOSIS:  Cervical spondylosis with cord compression.   OPERATIVE PROCEDURE:  C3-4, C4-5, and C5-6 anterior cervical  decompression and fusion with Reflex hybrid plate and cancelled occiput  to C6 fusion.   ANESTHESIA:  General endotracheal.   PREPARATION:  Prepped and draped with alcohol wipe.   COMPLICATIONS:  None.   NURSE ASSISTANT:  Leda Quail, MD   BODY OF TEXT:  This is a 30 year old girl with severe cervical  spondylosis, scoliosis, and cord compression.  The oringal procedure was  in the anterior decompression and fusion from C3-C6 and a posterior  cervical fusion from the occiput to C6.    She was taken to the operative room, smoothly anesthetized, intubated,  placed supine on the operating table in the halter head traction.  Following shave, prep, and drape in usual sterile fashion, skin incision  was made paralleling sternocleidomastoid muscle, starting at about the  level of carotid tubercle extending up to the mandibular line.  The  platysma was identified, elevated, divided, and undermined.  Sternocleidomastoid was identified.  Medial dissection revealed the  carotid artery retracted laterally.  Left trachea and esophagus  retracted laterally on the right exposing the bones of the anterior  cervical spine.  Marker was placed.  Intraoperative x-ray was obtained  to confirm correctness of level.  Having confirmed correctness of level,  diskectomy was carried out at C3-4, C4-5, and C5-6 after taking down the  longus colli and placed in  the shadow line retractor.  The microscope  was then brought in.  We used microdissection technique to remove the  remaining disks, divided the posterior longitudinal ligament, and  explored the neural foramina bilaterally.  She was given Decadron prior  to any manipulation of the anterior epidural space.  Following complete  decompression of all 3 levels, a 7-mm bone graft was fashioned from  patellar allograft and tapped into place.  Reflex hybrid plate was then  placed with 12-mm screws, 2 in C3, 2 in C4,  2 in C5, and 2 in C6.  Intraoperative x-ray was taken and confirmed good placement of bone  graft, plate, and screws.  Wound was irrigated.  A Jackson-Pratt drain  was placed in the prevertebral space and exited via a separate incision.  Successive layers of 3-0 Vicryl and 3-0 nylon were used to close.  Betadine and Telfa dressing was applied and made occlusive with OpSite.  The patient was placed in Gardner-Wells tongs and on the Stryker bed  turned prone.  The plan was to operate from the occiput to C6.  The  patient's body habitus simply would not allow this and she had a very,  very large  hump on her back that extended and was in contact with her  occiput.  After tapping down the shoulders, there was still limited  access to virtually no access.  Prolene sutures were placed in the skin  over the shoulders and then occiput and pulled on in an attempt to open  this intertriginous fold and was simply not feasible to do it.  Because  of  the increased risk of infection and because we felt we had good  decompression and stabilization anteriorly, the posterior portion of the  operation was therefore not carried out.  The patient was then placed in  Aspen collar and returned to the recovery room.           ______________________________  Elizabeth Sauer, M.D.     MWR/MEDQ  D:  09/23/2008  T:  09/23/2008  Job:  [id number]

## 2010-12-11 NOTE — H&P (Signed)
Briana Williams, MINSTER NO.:  [id number]   MEDICAL RECORD NO.:  [id number]          PATIENT TYPE:  INP   LOCATION:  2899                         FACILITY:  [facility]   PHYSICIAN:  Elizabeth Sauer, M.D.      DATE OF BIRTH:  03-13-1981   DATE OF ADMISSION:  09/23/2008  DATE OF DISCHARGE:                              HISTORY & PHYSICAL   ADMISSION DIAGNOSES:  1. Atlanto-occipital invagination.  2. Cervical spondylosis.   This is a now 30 year old right-handed white girl with an extensive  history of scoliosis.  She had an operation as a youngster and got a  hardware infected about 7 or 8 years ago, had to have it removed, and  has been doing well.  I saw her back in September 2010, with some mid  thoracic and neck pain.  She began experiencing dysesthesias in her  hands as well as pain into her neck.  MRI of the cervical spine and CT  as well was obtained that shows that she has a partial assimilation of  the atlas and basilar invagination.  A herniated disk at C3-C4 with  compression of the cord there and at C4-C5 as well as compression of the  cervical medullary junction, but posteriorly.  She is admitted now for  360 operation, that is C3-C4, C4-C5, C5-C6 anterior decompression and  fusion and then occiput to C6 fusion posteriorly.   Medical history is remarkable for multiple congenital abnormalities and  now diabetic.  She is on insulin and metformin.   Allergies are to CODEINE which causes her to itch.   REVIEW OF SYSTEMS:  Remarkable for back and thoracic pain.   PHYSICAL EXAMINATION:  HEENT:  A very large head with limited range of  motion in her neck.  CHEST:  Clear.  CARDIAC:  Regular rate and rhythm.  SKIN:  Her thoracolumbar incisions are well healed.  NEUROLOGIC:  She is awake, alert, and oriented.  Her cranial nerves are  intact.  MOTOR:  5/5 strength throughout the upper and lower extremities.  On the  right side, knee jerk is 2, ankle jerk is 2.  On  the left, knee jerk is  1 and ankle jerk is 1.  She has a positive Hoffman bilaterally and  weakness of the grip.  She also complains of Lhermitte phenomenon.   MRI was obtained, demonstrated disease at C6-C7, C5-C6, C4-C5, and C3-  C4.  There was lot of degenerative change with disk bulge, small canal,  and significant cord compression.   The plan is for C3-4, C4-5, C5-6 anterior decompression and fusion and  then occiput to C6 fusion posteriorly with the C3-C4 laminectomy or  laminoplasty if it is not feasible to reach C3-C4 from the front.  If  she can be reached from the front, probably distal laminoplasty would be  necessary.  The risks and benefits of this approach have been discussed  with her and she wished to proceed.           ______________________________  Elizabeth Sauer, M.D.     MWR/MEDQ  D:  09/23/2008  T:  09/23/2008  Job:  [id number]

## 2010-12-14 NOTE — Op Note (Signed)
[place]  Patient:    Briana Williams, Briana Williams                 MRN: [id number] Proc. Date: 02/01/00 Adm. 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  PROCEDURE:  Removal of T2 to L4 hardware.  SURGEON:  Elizabeth Sauer, M.D.  ANESTHESIA:  General endotracheal.  PREPARATION:  Prepped with sterile Betadine, and prepped and scrubbed with alcohol wipe.  COMPLICATIONS:  None.  FINDINGS:  Significant findings were pus around the hardware.  INDICATIONS:  This is a 30 year old girl who had scoliosis hardware placed in the remote past.  She has had part of it removed because of infection.  She presents now with progressive paraparesis.  DESCRIPTION OF PROCEDURE:  The patient was taken to the operating room, smoothly anesthetized and intubated, and placed prone on the operating table. Following shave, prep, and drape in the usual sterile fashion, her old skin incision was reopened from T2 to approximately L4.  Working in the subcutaneous space outside the spinal canal, the rods and hook hardware was identified piecemeal.  The rod was cut at the hook sites and the hooks removed from the sublaminar and intercanalicular positions.  The rods were also removed.  I then rehooked below T7.  Pus was found around the hook and along the course of the rod.  It was removed without difficulty.  Several liters of saline and Bacitracin were used to irrigate the entire _______.  Following this, the fascia was reapproximated with 3-0 Vicryl in an interrupted fashion.  The subcutaneous tissue was reapproximated with 0 Vicryl in an interrupted fashion.  The subcuticular tissue was reapproximated with 3-0 Vicryl in an interrupted fashion.  The skin was closed with  3-0 nylon in a running locked fashion.  Betadine Telfa dressing was applied and made occlusive with Op-Site.  The patient was then returned to the recovery room in good condition. DD:  02/01/00 TD:  02/01/00 Job: [id number]

## 2010-12-14 NOTE — H&P (Signed)
Briana Williams, WOOLUMS          ACCOUNT NO.:  [id number]   MEDICAL RECORD NO.:  [id number]          PATIENT TYPE:  AMB   LOCATION:  SDC                           FACILITY:  [facility]   PHYSICIAN:  Agnes Lawrence, M.D.DATE OF BIRTH:  [birth year]   DATE OF ADMISSION:  DATE OF DISCHARGE:                              HISTORY & PHYSICAL   CHIEF COMPLAINT:  The patient is a 30 year old with a cystic right  adnexal mass who presents for laparoscopic excisional procedure.   HISTORY OF PRESENT ILLNESS:  The patient presented in October  complaining of irregular menstrual cycles and secondary infertility.  An  ultrasound at that point demonstrated a 6-cm simple right para-ovarian  cyst.  At that point she was asymptomatic.  However, subsequent to this  she developed pelvic pain.  A repeat ultrasound on November 16  demonstrated persistence and probable slight interval increase in size  of the cystic lesion, now measuring 7.7 cm in its largest diameter.   PAST MEDICAL HISTORY:  Type 1 diabetes.   PAST SURGICAL HISTORY:  She has had multiple spinal surgeries secondary  to scoliosis.   SOCIAL HISTORY:  She is married, living with her spouse.  She is a  Agricultural engineer.  She has no significant smoking history.  She does not give  any significant history of alcohol usage.  She denies illicit drug use.   FAMILY HISTORY:  CVA.   PAST GYN HISTORY:  Menstrual periods are described as irregular with a  duration of more than seven days, the flow is described as heavy.  She  has had moderate dysmenorrhea.   PAST OBSTETRICAL HISTORY:  She has never been pregnant.   REVIEW OF SYSTEMS:  GYN:  Pelvic pain.   PHYSICAL EXAMINATION:  GENERAL:  Caucasian female, overweight body  habitus.  SKIN:  Hirsutism.  Erythema in the intertriginous areas.  LUNGS:  Clear to auscultation bilaterally.  HEART:  Regular rate and rhythm.  ABDOMEN:  Soft, nontender, without masses.  Bowel sounds active.  Female  escutcheon.  Centripetal obesity.  PELVIC:  External female genitalia are normal-appearing.  Uterus is  nontender, of normal size and shape, and consistency, position and  mobility are normal.  Adnexa are not palpable; however, there is minimal  right adnexal tenderness.   ASSESSMENT:  Persistent, now symptomatic right cystic adnexal lesion.   PLAN:  The planned procedure is a laparoscopic excisional procedure.  The risks and benefits of surgery were reviewed with the patient, and  informed consent has been obtained.      Agnes Lawrence, M.D.  Electronically Signed     LAJ/MEDQ  D:  07/03/2006  T:  07/03/2006  Job:  [id number]

## 2010-12-14 NOTE — Discharge Summary (Signed)
[place]. [facility]  Patient:    Briana Williams, Briana Williams                 MRN: [id number] Adm. Date:  [id number] Disc. Date: [id number] Attending:  Melton Krebs CC:         Elizabeth Sauer, M.D.                           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  DISCHARGE DIAGNOSES: 1. 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 2. Infected hardware.  COMPLICATIONS:  None.  HISTORY OF PRESENT ILLNESS:  This is a 30 year old, right-handed, white girl whose history and physical is recounted in the chart.  She had scoliosis operation numerous years ago and then had an infection.  Part of the hardware was removed.  She presents now with increasing thoracic myelopathy.  CT scanning demonstrated penetration of hooks into her canal and she is admitted for removal of hardware.  PAST MEDICAL HISTORY:  As noted above with occipital encephalocele.  MEDICATIONS:  None.  PHYSICAL EXAMINATION:  GENERAL:  Remarkable for scoliosis and other deformities, none of which were debilitating.  NEUROLOGIC:  Thoracic myelopathy.  HOSPITAL COURSE:  She was admitted after obtaining the normal laboratory values and underwent removal of the hardware from T2 to L4.  During the course of the operation, pus was found and Enterococcus was cultured from that pus. Postoperatively, she has done well.  She had some soreness and used PCA for a couple of days but has basically been up and about.  She is afebrile.  Her incision is well-healing.  She has been on oral Percocet for a day or so and is now up and around.  She was seen by infectious disease who recommended oral amoxicillin three times a day and we will follow her as an outpatient.  DISCHARGE MEDICATIONS: 1. Percocet. 2. Amoxicillin.  FOLLOWUP:  Follow up in a week for suture removal. DD:  02/05/00 TD:   02/06/00 Job: [id number]

## 2010-12-14 NOTE — H&P (Signed)
[place]  Patient:    Briana Williams, Briana Williams                 MRN: [id number] Adm. 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  HISTORY OF PRESENT ILLNESS:  This is a 30 year old right handed white female who has scoliosis and had a fusion from her upper thoracic to sacral spine approximately eight years ago.  She had some infected rods part of which were removed several years ago and had been doing well.  She has fallen getting into a bathtub.  She has had numbness and weakness in her leg.  CT scanning revealed hooks of her thoracic spine that had migrated into the canal. She is now admitted for removal of the hardware and decompression of her cord.  PAST MEDICAL HISTORY:  Remarkable for occipital encephalocele and the spine fusion as noted above.  ALLERGIES:  She is allergic to Vicodin.  MEDICATIONS: She is on no medications.  FAMILY HISTORY:  Benign.  SOCIAL HISTORY: She does not smoke or drink and is recently married.  REVIEW OF SYSTEMS:  Noncontributory.  PHYSICAL EXAMINATION:  HEENT:  Healed encephalocele scar.  NECK: Good range of motion.  CHEST:  Clear.  CARDIAC:  Regular rate and rhythm.  NEUROLOGIC:  She is awake, alert and oriented. Pupils equal, round and reactive to light.  Her extraocular movements are intact.  Facial movement and sensation are intact.  Tongue is in the midline.  Speech is normal.  Motor exam demonstrates full strength. She has some difficulty with hip flexion but it is not weakness. It is mechanical probably related to her obesity and her scoliosis.  She is hyperreflexic in the left knee jerk 3 and ankle jerks are 3 bilaterally. There is no clonus and she has down going toes.  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  The fact that she has improved since her fall is very heartening; however, I think the fact that she injured her cord at all strongly suggest that we should go ahead and get this hardware out. The risks and benefits have been discussed with she, her parents and her new husband and wishes to proceed. DD:  02/01/00 TD:  02/01/00 Job: [id number]

## 2010-12-14 NOTE — Op Note (Signed)
Briana Williams, AZUMA          ACCOUNT NO.:  [id number]   MEDICAL RECORD NO.:  [id number]          PATIENT TYPE:  INP   LOCATION:  [id number]                          FACILITY:  [facility]   PHYSICIAN:  Agnes Lawrence, M.D.DATE OF BIRTH:  07-Sep-1980   DATE OF PROCEDURE:  07/04/2006  DATE OF DISCHARGE:  07/04/2006                               OPERATIVE REPORT   PREOPERATIVE DIAGNOSIS:  Cystic right-sided adnexal mass.   POSTOPERATIVE DIAGNOSIS:  Right-sided paraovarian cyst.   PROCEDURE:  Laparoscopic right ovarian cystectomy complicated by  obesity.  The patient's BMI is 59.1.   SURGEON:  Agnes Lawrence, M.D. and Clenton Pare. Jodi Mourning, M.D.   ANESTHESIA:  General endotracheal.   COMPLICATIONS:  None.   ESTIMATED BLOOD LOSS:  Minimal.   FINDINGS:  Upon survey of the anatomy, there was a cystic structure  located in the broad ligament on the right side with fallopian tube  splayed over the top of the structure.  Upon further dissection, the  cyst had an attachment to the most cephalad aspect of the ovary.   DESCRIPTION OF PROCEDURE:  The patient was taken to the operating room  with an IV running.  The patient was given general anesthesia, placed in  the dorsal lithotomy position, and prepped and draped in the usual  sterile fashion.  A bivalve speculum was placed in the patient's vagina.  The anterior lip of the cervix was grasped with a single-tooth  tenaculum.  A Hulka manipulator was then advanced into the uterus and  secured to the anterior lip of the cervix as a means to manipulate the  uterus.  The single-tooth tenaculum was then removed.   A supraumbilical skin incision was then made with the scalpel.  This was  carried down sharply to the fascia.  The fascia was tented up and  entered sharply.  The parietoperitoneum was tented up and entered  sharply as well.  A Hasson trocar and sleeve was then advanced into the  abdomen.  The abdomen was then insufflated with  CO2 gas.  Bilateral  lower quadrant 5-mm trocars were then introduced into the abdomen under  direct visualization as well as a 10-mm suprapubic trocar.  A survey of  the pelvis revealed the above findings.  The exposure was limited by the  patient's body habitus and limitations with the Trendelenburg  positioning.  The broad ligament capsule of the mass was then incised  with the Endoshears, and the mass was then enucleated and dissected free  of the broad ligament.   At this point the attachment to the ovaries was identified and  cauterized with the Kleppinger's, this was divided with the shears.  The  mass was then placed into an EndoCatch; and to facilitate removal from  the abdomen, the cyst was drained in the Endo bag; and there was no  spillage of cyst wall contents.  The EndoCatch and was then removed from  the abdomen.  The fascial incisions for the suprapubic and periumbilical  ports were closed using running sutures of #0 Vicryl.  The skin was  reapproximated with 3-0 Vicryl and Dermabond.  Please  note that prior to  closure, the ovary and broad ligament were felt to be hemostatic.  The  Hulka manipulator was removed from the vagina with minimal bleeding  noted from the cervix.  At the close of the procedure the instrument and  pack counts were said to be correct x2.  The patient was taken to the  PACU awake and in stable condition.      Agnes Lawrence, M.D.  Electronically Signed     LAJ/MEDQ  D:  07/04/2006  T:  07/04/2006  Job:  [id number]

## 2011-01-12 ENCOUNTER — Observation Stay (HOSPITAL_COMMUNITY): Payer: Medicaid Other

## 2011-01-12 ENCOUNTER — Observation Stay (HOSPITAL_COMMUNITY)
Admission: EM | Admit: 2011-01-12 | Discharge: 2011-01-12 | Disposition: A | Discharge status: Home / Self Care | Payer: Medicaid Other | Attending: Emergency Medicine | Admitting: Emergency Medicine

## 2011-01-12 DIAGNOSIS — N12 Tubulo-interstitial nephritis, not specified as acute or chronic: Principal | ICD-10-CM | POA: Insufficient documentation

## 2011-01-12 LAB — POCT I-STAT, CHEM 8
Creatinine, Ser: 0.6 mg/dL (ref 0.50–1.10)
HCT: 43 % (ref 36.0–46.0)
Hemoglobin: 14.6 g/dL (ref 12.0–15.0)
Sodium: 134 mEq/L — ABNORMAL LOW (ref 135–145)
TCO2: 19 mmol/L (ref 0–100)

## 2011-01-12 LAB — URINALYSIS, ROUTINE W REFLEX MICROSCOPIC
Glucose, UA: >1000 mg/dL — AB
Specific Gravity, Urine: 1.039 — ABNORMAL HIGH (ref 1.005–1.030)
Urobilinogen, UA: 0.2 mg/dL (ref 0.0–1.0)
pH: 6 (ref 5.0–8.0)

## 2011-01-12 LAB — URINE MICROSCOPIC-ADD ON

## 2011-01-12 LAB — DIFFERENTIAL
Basophils Absolute: 0 10*3/uL (ref 0.0–0.1)
Lymphocytes Relative: 32 % (ref 12–46)
Lymphs Abs: 3.2 10*3/uL (ref 0.7–4.0)
Neutro Abs: 5.8 10*3/uL (ref 1.7–7.7)
Neutrophils Relative %: 59 % (ref 43–77)

## 2011-01-12 LAB — COMPREHENSIVE METABOLIC PANEL
BUN: 10 mg/dL (ref 6–23)
Calcium: 9.5 mg/dL (ref 8.4–10.5)
GFR calc Af Amer: >60 mL/min (ref >60)
Glucose, Bld: 343 mg/dL — ABNORMAL HIGH (ref 70–99)
Sodium: 132 mEq/L — ABNORMAL LOW (ref 135–145)
Total Protein: 8.1 g/dL (ref 6.0–8.3)

## 2011-01-12 LAB — LIPASE, BLOOD: Lipase: 33 U/L (ref 11–59)

## 2011-01-12 LAB — CBC
HCT: 39.6 % (ref 36.0–46.0)
Hemoglobin: 14.3 g/dL (ref 12.0–15.0)
MCV: 85.3 fL (ref 78.0–100.0)
RBC: 4.64 MIL/uL (ref 3.87–5.11)
RDW: 14.4 % (ref 11.5–15.5)
WBC: 9.8 10*3/uL (ref 4.0–10.5)

## 2011-01-12 LAB — POCT PREGNANCY, URINE: Preg Test, Ur: NEGATIVE

## 2011-01-12 MED ORDER — IOHEXOL 300 MG/ML  SOLN
100.0000 mL | Freq: Once | INTRAMUSCULAR | Status: AC | PRN
Start: 2011-01-12 — End: 2011-01-12
  Administered 2011-01-12: 100 mL via INTRAVENOUS

## 2011-01-15 LAB — URINE CULTURE: Culture  Setup Time: 201206170258

## 2011-04-22 LAB — POCT RAPID STREP A: Streptococcus, Group A Screen (Direct): NEGATIVE

## 2011-04-29 LAB — BASIC METABOLIC PANEL
BUN: 9
CO2: 23
Chloride: 108
Glucose, Bld: 229 — ABNORMAL HIGH
Potassium: 4.1
Sodium: 139

## 2011-04-29 LAB — URINALYSIS, ROUTINE W REFLEX MICROSCOPIC
Hgb urine dipstick: NEGATIVE
Leukocytes, UA: NEGATIVE
Nitrite: NEGATIVE
Specific Gravity, Urine: 1.033 — ABNORMAL HIGH
Urobilinogen, UA: 0.2

## 2011-04-29 LAB — CBC
HCT: 36.4
Hemoglobin: 12.4
MCV: 88.9
Platelets: 136 — ABNORMAL LOW
RDW: 13.6

## 2011-04-29 LAB — URINE MICROSCOPIC-ADD ON

## 2011-04-29 LAB — DIFFERENTIAL
Basophils Absolute: 0
Eosinophils Absolute: 0.1
Eosinophils Relative: 2
Monocytes Absolute: 0.3

## 2011-04-29 LAB — D-DIMER, QUANTITATIVE: D-Dimer, Quant: 0.3

## 2011-05-14 LAB — URINE CULTURE

## 2011-05-14 LAB — CBC
HCT: 38.5
Hemoglobin: 12.9
MCHC: 33.4
MCV: 87.6
RBC: 4.4
WBC: 7.6

## 2011-05-14 LAB — URINE MICROSCOPIC-ADD ON

## 2011-05-14 LAB — URINALYSIS, ROUTINE W REFLEX MICROSCOPIC
Ketones, ur: 15 — AB
Nitrite: NEGATIVE
pH: 6

## 2011-11-06 ENCOUNTER — Other Ambulatory Visit: Payer: Self-pay | Admitting: Neurosurgery

## 2011-11-06 ENCOUNTER — Ambulatory Visit
Admission: RE | Admit: 2011-11-06 | Discharge: 2011-11-06 | Disposition: A | Discharge status: Home / Self Care | Payer: Medicaid Other | Source: Ambulatory Visit | Attending: Neurosurgery | Admitting: Neurosurgery

## 2011-11-06 DIAGNOSIS — M419 Scoliosis, unspecified: Secondary | ICD-10-CM

## 2011-11-13 ENCOUNTER — Other Ambulatory Visit: Payer: Self-pay | Admitting: Neurological Surgery

## 2011-11-13 DIAGNOSIS — R531 Weakness: Secondary | ICD-10-CM

## 2011-11-18 ENCOUNTER — Inpatient Hospital Stay: Admission: RE | Admit: 2011-11-18 | Payer: Medicaid Other | Source: Ambulatory Visit

## 2011-11-18 ENCOUNTER — Other Ambulatory Visit: Payer: Medicaid Other

## 2012-01-03 ENCOUNTER — Emergency Department (HOSPITAL_COMMUNITY): Payer: No Typology Code available for payment source

## 2012-01-03 ENCOUNTER — Emergency Department (HOSPITAL_COMMUNITY)
Admission: EM | Admit: 2012-01-03 | Discharge: 2012-01-03 | Disposition: A | Discharge status: Home / Self Care | Payer: No Typology Code available for payment source | Attending: Emergency Medicine | Admitting: Emergency Medicine

## 2012-01-03 ENCOUNTER — Encounter (HOSPITAL_COMMUNITY): Payer: Self-pay

## 2012-01-03 DIAGNOSIS — E119 Type 2 diabetes mellitus without complications: Secondary | ICD-10-CM | POA: Insufficient documentation

## 2012-01-03 DIAGNOSIS — M4120 Other idiopathic scoliosis, site unspecified: Secondary | ICD-10-CM | POA: Insufficient documentation

## 2012-01-03 DIAGNOSIS — S42209A Unspecified fracture of upper end of unspecified humerus, initial encounter for closed fracture: Secondary | ICD-10-CM

## 2012-01-03 DIAGNOSIS — Y998 Other external cause status: Secondary | ICD-10-CM | POA: Insufficient documentation

## 2012-01-03 DIAGNOSIS — Y92410 Unspecified street and highway as the place of occurrence of the external cause: Secondary | ICD-10-CM | POA: Insufficient documentation

## 2012-01-03 DIAGNOSIS — E785 Hyperlipidemia, unspecified: Secondary | ICD-10-CM | POA: Insufficient documentation

## 2012-01-03 DIAGNOSIS — Z794 Long term (current) use of insulin: Secondary | ICD-10-CM | POA: Insufficient documentation

## 2012-01-03 DIAGNOSIS — Z79899 Other long term (current) drug therapy: Secondary | ICD-10-CM | POA: Insufficient documentation

## 2012-01-03 DIAGNOSIS — N289 Disorder of kidney and ureter, unspecified: Secondary | ICD-10-CM | POA: Insufficient documentation

## 2012-01-03 DIAGNOSIS — S42213A Unspecified displaced fracture of surgical neck of unspecified humerus, initial encounter for closed fracture: Secondary | ICD-10-CM | POA: Insufficient documentation

## 2012-01-03 HISTORY — DX: Hyperlipidemia, unspecified: E78.5

## 2012-01-03 HISTORY — DX: Disorder of kidney and ureter, unspecified: N28.9

## 2012-01-03 HISTORY — DX: Scoliosis, unspecified: M41.9

## 2012-01-03 MED ORDER — HYDROMORPHONE HCL PF 1 MG/ML IJ SOLN: 1.0000 mg | INTRAMUSCULAR | Status: DC | PRN

## 2012-01-03 MED ORDER — HYDROMORPHONE HCL PF 2 MG/ML IJ SOLN
2.0000 mg | Freq: Once | INTRAMUSCULAR | Status: AC
  Administered 2012-01-03: 2 mg via INTRAMUSCULAR
  Filled 2012-01-03: qty 1

## 2012-01-03 MED ORDER — ONDANSETRON HCL 4 MG/2ML IJ SOLN: 4.0000 mg | Freq: Once | INTRAMUSCULAR | Status: DC

## 2012-01-03 MED ORDER — ONDANSETRON 4 MG PO TBDP
ORAL_TABLET | ORAL | Status: AC
  Administered 2012-01-03: 4 mg
  Filled 2012-01-03: qty 2

## 2012-01-03 MED ORDER — ONDANSETRON 8 MG PO TBDP: 8.0000 mg | ORAL_TABLET | Freq: Three times a day (TID) | ORAL | Status: AC | PRN

## 2012-01-03 MED ORDER — OXYCODONE-ACETAMINOPHEN 5-325 MG PO TABS: 1.0000 | ORAL_TABLET | Freq: Four times a day (QID) | ORAL | Status: AC | PRN
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
## 2012-01-03 NOTE — Progress Notes (Signed)
Orthopedic Tech Progress Note Patient Details:  Briana Williams March 21, 1981 [id number]  Ortho Devices Type of Ortho Device: Coapt;Arm foam sling Ortho Device/Splint Location: (R) UE Ortho Device/Splint Interventions: Application   Braulio Bosch 01/03/2012, 7:21 PM
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
## 2012-01-03 NOTE — Discharge Instructions (Signed)
Humerus Fracture, Treated with Immobilization The humerus is the large bone in your upper arm. You have a broken (fractured) humerus. These fractures are easily diagnosed with X-rays. TREATMENT  Simple fractures which will heal without disability are treated with simple immobilization. Immobilization means you will wear a cast, splint, or sling. You have a fracture which will do well with immobilization. The fracture will heal well simply by being held in a good position until it is stable enough to begin range of motion exercises. Do not take part in activities which would further injure your arm.  HOME CARE INSTRUCTIONS   Put ice on the injured area.   Put ice in a plastic bag.   Place a towel between your skin and the bag.   Leave the ice on for 15 to 20 minutes, 3 to 4 times a day.   If you have a cast:   Do not scratch the skin under the cast using sharp or pointed objects.   Check the skin around the cast every day. You may put lotion on any red or sore areas.   Keep your cast dry and clean.   If you have a splint:   Wear the splint as directed.   Keep your splint dry and clean.   You may loosen the elastic around the splint if your fingers become numb, tingle, or turn cold or blue.   If you have a sling:   Wear the sling as directed.   Do not put pressure on any part of your cast or splint until it is fully hardened.   Your cast or splint can be protected during bathing with a plastic bag. Do not lower the cast or splint into water.   Only take over-the-counter or prescription medicines for pain, discomfort, or fever as directed by your caregiver.   Do range of motion exercises as instructed by your caregiver.   Follow up as directed by your caregiver. This is very important in order to avoid permanent injury or disability and chronic pain.  SEEK IMMEDIATE MEDICAL CARE IF:   Your skin or nails in the injured arm turn blue or gray.   Your arm feels cold or numb.     You develop severe pain in the injured arm.   You are having problems with the medicines you were given.  MAKE SURE YOU:   Understand these instructions.   Will watch your condition.   Will get help right away if you are not doing well or get worse.  Document Released: 10/21/2000 Document Revised: 07/04/2011 Document Reviewed: 08/29/2010 [facility] Patient Information 2012 [place].

## 2012-08-21 ENCOUNTER — Emergency Department (HOSPITAL_COMMUNITY)
Admission: EM | Admit: 2012-08-21 | Discharge: 2012-08-21 | Disposition: A | Discharge status: Home / Self Care | Payer: Medicaid Other | Source: Home / Self Care | Attending: Family Medicine | Admitting: Family Medicine

## 2012-08-21 ENCOUNTER — Encounter (HOSPITAL_COMMUNITY): Payer: Self-pay

## 2012-08-21 DIAGNOSIS — M4120 Other idiopathic scoliosis, site unspecified: Secondary | ICD-10-CM

## 2012-08-21 DIAGNOSIS — R809 Proteinuria, unspecified: Secondary | ICD-10-CM

## 2012-08-21 DIAGNOSIS — E781 Pure hyperglyceridemia: Secondary | ICD-10-CM

## 2012-08-21 HISTORY — DX: Hypertension secondary to endocrine disorders: I15.2

## 2012-08-21 HISTORY — DX: Long term (current) use of insulin: Z79.4

## 2012-08-21 HISTORY — DX: Type 2 diabetes mellitus with other circulatory complications: E11.59

## 2012-08-21 HISTORY — DX: Type 2 diabetes mellitus without complications: E11.9

## 2012-08-21 HISTORY — DX: Essential (primary) hypertension: I10

## 2012-08-21 MED ORDER — ALPRAZOLAM 0.5 MG PO TABS: 0.5000 mg | ORAL_TABLET | Freq: Two times a day (BID) | ORAL | Status: DC | PRN

## 2012-08-21 MED ORDER — INSULIN ASPART 100 UNIT/ML ~~LOC~~ SOLN: 14.0000 [IU] | Freq: Three times a day (TID) | SUBCUTANEOUS | Status: DC

## 2012-08-21 MED ORDER — SERTRALINE HCL 100 MG PO TABS: 100.0000 mg | ORAL_TABLET | Freq: Every day | ORAL | Status: DC

## 2012-08-21 MED ORDER — LISINOPRIL 30 MG PO TABS: 30.0000 mg | ORAL_TABLET | Freq: Every day | ORAL | Status: DC

## 2012-08-21 MED ORDER — METFORMIN HCL 500 MG PO TABS: 500.0000 mg | ORAL_TABLET | Freq: Every day | ORAL | Status: DC

## 2012-08-21 MED ORDER — INSULIN GLARGINE 100 UNIT/ML ~~LOC~~ SOLN: 12.0000 [IU] | Freq: Every day | SUBCUTANEOUS | Status: DC

## 2012-08-21 NOTE — ED Notes (Signed)
Has a history of DM- needs medication refill

## 2012-08-21 NOTE — ED Provider Notes (Signed)
History    CSN: [id number]  Arrival date & time 08/21/12  1237   First MD Initiated Contact with Patient 08/21/12 1415    Chief Complaint  Patient presents with  . Diabetes   The history is provided by the patient. No language interpreter was used.   Pt preparing to have scoliosis surgery on Sep 02, 2012.    Pt has been treated at [facility] for DM and has been treated for hyperlipidemia.   Pt has been cleared to proceed with surgery.  Pt says she had bloodwork done recently in preparation for her surgery.  The patient did not bring any of her medical records from [facility].  The patient reports that she is monitoring her blood sugars regularly and she's not having any blood sugar readings over 200.  She denies having hypoglycemia.  She has been seeing an endocrinologist and has a sliding scale insulin formula that she's using.  She takes Lantus daily and she takes NovoLog injections before meals with the amount of 10-14 units based on a sliding scale that she's been using.  She did not bring her sliding scale with her today.  Past Medical History  Diagnosis Date  . Scoliosis   . Diabetes mellitus   . Renal disorder   . Hyperlipidemia     Past Surgical History  Procedure Date  . Back surgery   . Hip surgery     staff infection  . Frontal closed sutures to forehead     No family history on file.  History  Substance Use Topics  . Smoking status: Never Smoker   . Smokeless tobacco: Not on file  . Alcohol Use: No    OB History    Grav Para Term Preterm Abortions TAB SAB Ect Mult Living                 Review of Systems  Constitutional: Positive for fatigue.  HENT: Positive for congestion.   Musculoskeletal: Positive for back pain, joint swelling, arthralgias and gait problem.       Scoliosis   Psychiatric/Behavioral: Positive for dysphoric mood.  All other systems reviewed and are negative.    Allergies  Ultrasound cream  Home Medications   Current Outpatient  Rx  Name  Route  Sig  Dispense  Refill  . ALPRAZOLAM 0.5 MG PO TABS   Oral   Take 0.5 mg by mouth 2 (two) times daily as needed. For anxiety.         Marland Kitchen HYDROCODONE-ACETAMINOPHEN 10-325 MG PO TABS   Oral   Take 1 tablet by mouth every 6 (six) hours as needed. For pain.         . INSULIN ASPART 100 UNIT/ML [place] SOLN   Subcutaneous   Inject into the skin 3 (three) times daily before meals. Per sliding scale         . INSULIN GLARGINE 100 UNIT/ML [place] SOLN   Subcutaneous   Inject 12-26 Units into the skin at bedtime.         Marland Kitchen LISINOPRIL 30 MG PO TABS   Oral   Take 30 mg by mouth daily.         Marland Kitchen METFORMIN HCL 500 MG PO TABS   Oral   Take 500 mg by mouth daily with breakfast.          BP 137/78  Pulse 89  Temp 98.7 F (37.1 C) (Oral)  Resp 16  SpO2 98%  Physical Exam  Nursing note and  vitals reviewed. Constitutional: She is oriented to person, place, and time. She appears well-developed and well-nourished. No distress.  HENT:  Head: Normocephalic and atraumatic.  Eyes: Conjunctivae normal and EOM are normal. Pupils are equal, round, and reactive to light.  Neck: Normal range of motion. Neck supple. No JVD present. No thyromegaly present.  Cardiovascular: Normal rate, regular rhythm and normal heart sounds.   Pulmonary/Chest: Effort normal and breath sounds normal.  Abdominal: Soft. Bowel sounds are normal.  Musculoskeletal: Normal range of motion.       Arms: Neurological: She is alert and oriented to person, place, and time. No cranial nerve deficit.  Skin: Skin is warm and dry. No rash noted. No erythema.  Psychiatric: She has a normal mood and affect. Her behavior is normal. Judgment and thought content normal.    ED Course  Procedures (including critical care time) And Labs Reviewed - No data to display No results found.  No diagnosis found.  MDM  IMPRESSION  Scoliosis - severe  Hypertension  DM, type 2   Depression and  anxiety  RECOMMENDATIONS / PLAN The patient's medications were refilled for her today.  She says that her Vicodin is refilled by her surgeon.  She has an anxiety disorder and she has been taking Xanax for quite some time according to the patient.  I strongly requested for them to provide [name] with her medical records.  She says she would have them sent to our office.  The patient gave me some information to try and request records of her labs that were recently drawn.  I asked the patient to continue monitoring her blood sugar closely.  I like for her to have her labs rechecked in 3 or 4 months.  FOLLOW UP 3 months  The patient was given clear instructions to go to ER or return to medical center if symptoms don't improve, worsen or new problems develop.  The patient verbalized understanding.  The patient was told to call to get lab results if they haven't heard anything in the next week.            Murlean Iba, MD 08/21/12 1501

## 2012-08-31 MED ORDER — LISINOPRIL 30 MG PO TABS: 30.0000 mg | ORAL_TABLET | Freq: Every day | ORAL | Status: DC

## 2012-08-31 MED ORDER — SERTRALINE HCL 100 MG PO TABS: 100.0000 mg | ORAL_TABLET | Freq: Every day | ORAL | Status: DC

## 2012-08-31 MED ORDER — GEMFIBROZIL 600 MG PO TABS: 600.0000 mg | ORAL_TABLET | Freq: Two times a day (BID) | ORAL | Status: DC

## 2013-02-05 ENCOUNTER — Other Ambulatory Visit: Payer: Self-pay | Admitting: Neurological Surgery

## 2013-02-05 DIAGNOSIS — Z4789 Encounter for other orthopedic aftercare: Secondary | ICD-10-CM

## 2013-02-05 DIAGNOSIS — M419 Scoliosis, unspecified: Secondary | ICD-10-CM

## 2013-02-13 ENCOUNTER — Other Ambulatory Visit: Payer: Medicaid Other

## 2013-02-21 ENCOUNTER — Ambulatory Visit
Admission: RE | Admit: 2013-02-21 | Discharge: 2013-02-21 | Disposition: A | Discharge status: Home / Self Care | Payer: Medicaid Other | Source: Ambulatory Visit | Attending: Neurological Surgery | Admitting: Neurological Surgery

## 2013-02-21 DIAGNOSIS — M419 Scoliosis, unspecified: Secondary | ICD-10-CM

## 2013-02-21 DIAGNOSIS — Z4789 Encounter for other orthopedic aftercare: Secondary | ICD-10-CM

## 2014-08-04 ENCOUNTER — Emergency Department (HOSPITAL_COMMUNITY)
Admission: EM | Admit: 2014-08-04 | Discharge: 2014-08-04 | Disposition: A | Discharge status: Home / Self Care | Payer: Medicaid Other | Source: Home / Self Care | Attending: Family Medicine | Admitting: Family Medicine

## 2014-08-04 ENCOUNTER — Encounter (HOSPITAL_COMMUNITY): Payer: Self-pay | Admitting: Emergency Medicine

## 2014-08-04 DIAGNOSIS — H6092 Unspecified otitis externa, left ear: Secondary | ICD-10-CM

## 2014-08-04 MED ORDER — NEOMYCIN-POLYMYXIN-HC 3.5-10000-1 OT SUSP
OTIC | Status: AC
  Filled 2014-08-04: qty 10

## 2014-08-04 MED ORDER — NEOMYCIN-POLYMYXIN-HC 1 % OT SOLN
4.0000 [drp] | Freq: Four times a day (QID) | OTIC | Status: DC
  Administered 2014-08-04: 4 [drp] via OTIC

## 2014-08-04 MED ORDER — HYDROCODONE-ACETAMINOPHEN 5-325 MG PO TABS: 1.0000 | ORAL_TABLET | Freq: Four times a day (QID) | ORAL | Status: DC | PRN

## 2014-08-04 MED ORDER — CEPHALEXIN 500 MG PO CAPS: 500.0000 mg | ORAL_CAPSULE | Freq: Three times a day (TID) | ORAL | Status: DC

## 2014-08-04 NOTE — ED Provider Notes (Signed)
CSN: [id number]     Arrival date & time 08/04/14  1744 History   First MD Initiated Contact with Patient 08/04/14 1804     Chief Complaint  Patient presents with  . Otalgia   (Consider location/radiation/quality/duration/timing/severity/associated sxs/prior Treatment) Patient is a 34 y.o. female presenting with ear pain. The history is provided by the patient.  Otalgia Location:  Left Quality:  Throbbing and sore Severity:  Moderate Onset quality:  Gradual Duration:  3 days Progression:  Worsening Chronicity:  New Context comment:  Onset after cleaning with q tips. Associated symptoms: no ear discharge     Past Medical History  Diagnosis Date  . Scoliosis   . Type 2 diabetes mellitus treated with insulin   . Renal disorder   . Hyperlipidemia   . Hypertension associated with diabetes    Past Surgical History  Procedure Laterality Date  . Back surgery    . Hip surgery      staph infection  . Frontal closed sutures to forehead    . Lumbar fusion    . Harrington rod placement    . Cervical fusion     Family History  Problem Relation Age of Onset  . Cancer Maternal Grandmother     mandibular cancer   . Diabetes Mother   . Diabetes Maternal Aunt   . Heart failure Mother   . CAD Mother     triple CABG  . Stroke Father    History  Substance Use Topics  . Smoking status: Never Smoker   . Smokeless tobacco: Not on file  . Alcohol Use: No   OB History    No data available     Review of Systems  Constitutional: Negative.   HENT: Positive for ear pain. Negative for ear discharge.     Allergies  Ultrasound cream  Home Medications   Prior to Admission medications   Medication Sig Start Date End Date Taking? Authorizing Provider  gemfibrozil (LOPID) 600 MG tablet Take 1 tablet (600 mg total) by mouth 2 (two) times daily. 08/31/12  Yes Clanford Marisa Hua, MD  insulin aspart (NOVOLOG) 100 UNIT/ML injection Inject 14 Units into the skin 3 (three) times daily before  meals. Per sliding scale or as directed by physician 08/21/12  Yes Clanford Marisa Hua, MD  insulin glargine (LANTUS) 100 UNIT/ML injection Inject 12-26 Units into the skin at bedtime. 08/21/12  Yes Clanford Marisa Hua, MD  metFORMIN (GLUCOPHAGE) 500 MG tablet Take 1 tablet (500 mg total) by mouth daily with breakfast. 08/21/12  Yes Clanford Marisa Hua, MD  ALPRAZolam Duanne Moron) 0.5 MG tablet Take 1 tablet (0.5 mg total) by mouth 2 (two) times daily as needed. For anxiety. 08/21/12   Clanford Marisa Hua, MD  cephALEXin (KEFLEX) 500 MG capsule Take 1 capsule (500 mg total) by mouth 3 (three) times daily. Take all of medicine and drink lots of fluids 08/04/14   Billy Fischer, MD  HYDROcodone-acetaminophen (NORCO/VICODIN) 5-325 MG per tablet Take 1 tablet by mouth every 6 (six) hours as needed for moderate pain or severe pain. 08/04/14   Billy Fischer, MD  lisinopril (PRINIVIL,ZESTRIL) 30 MG tablet Take 1 tablet (30 mg total) by mouth daily. 08/31/12   Clanford Marisa Hua, MD  sertraline (ZOLOFT) 100 MG tablet Take 1 tablet (100 mg total) by mouth daily. 08/31/12   Clanford L Johnson, MD   BP 136/92 mmHg  Pulse 94  Temp(Src) 99 F (37.2 C) (Oral)  Resp 18  SpO2 99% Physical  Exam  Constitutional: She appears well-developed and well-nourished.  HENT:  Head: Normocephalic.  Right Ear: Hearing, tympanic membrane, external ear and ear canal normal.  Left Ear: There is swelling and tenderness. No foreign bodies. Tympanic membrane is not injected.  Mouth/Throat: Oropharynx is clear and moist.  Nursing note and vitals reviewed.   ED Course  Procedures (including critical care time) Labs Review Labs Reviewed - No data to display  Imaging Review No results found.   MDM   1. Otitis externa of left ear    Ear wick placed.    Billy Fischer, MD 08/04/14 [phone]

## 2014-08-04 NOTE — Discharge Instructions (Signed)
Return on sat or sun for wick removal. Use drops as discussed.

## 2014-08-04 NOTE — ED Notes (Signed)
C/o severe left ear pain x 3 days.  Denies fever, n/v/d.  No relief with ot ear drops.

## 2015-06-06 ENCOUNTER — Encounter (HOSPITAL_COMMUNITY): Payer: Self-pay | Admitting: Emergency Medicine

## 2015-06-06 ENCOUNTER — Emergency Department (INDEPENDENT_AMBULATORY_CARE_PROVIDER_SITE_OTHER)
Admission: EM | Admit: 2015-06-06 | Discharge: 2015-06-06 | Disposition: A | Discharge status: Home / Self Care | Payer: Medicaid Other | Source: Home / Self Care | Attending: Family Medicine | Admitting: Family Medicine

## 2015-06-06 DIAGNOSIS — M5432 Sciatica, left side: Secondary | ICD-10-CM

## 2015-06-06 MED ORDER — PREDNISONE 10 MG PO TABS
ORAL_TABLET | ORAL | Status: DC
Start: 2015-06-06 — End: 2015-09-21

## 2015-06-06 MED ORDER — KETOROLAC TROMETHAMINE 60 MG/2ML IM SOLN
60.0000 mg | Freq: Once | INTRAMUSCULAR | Status: AC
  Administered 2015-06-06: 60 mg via INTRAMUSCULAR

## 2015-06-06 MED ORDER — KETOROLAC TROMETHAMINE 60 MG/2ML IM SOLN
INTRAMUSCULAR | Status: AC
  Filled 2015-06-06: qty 2

## 2015-06-06 MED ORDER — TIZANIDINE HCL 4 MG PO TABS: 4.0000 mg | ORAL_TABLET | Freq: Four times a day (QID) | ORAL | Status: DC | PRN

## 2015-06-06 NOTE — ED Provider Notes (Signed)
CSN: [id number]     Arrival date & time 06/06/15  1500 History   First MD Initiated Contact with Patient 06/06/15 1603     Chief Complaint  Patient presents with  . Leg Pain   (Consider location/radiation/quality/duration/timing/severity/associated sxs/prior Treatment) HPI Comments: C/o left back pain radiating down her left leg to knee area and has become worse over last week.  Her pain level is moderate and becomes severe with ambulation.  She has hx of scoliosis.  Patient is a 34 y.o. female presenting with leg pain.  Leg Pain Associated symptoms: back pain     Past Medical History  Diagnosis Date  . Scoliosis   . Type 2 diabetes mellitus treated with insulin ([place])   . Renal disorder   . Hyperlipidemia   . Hypertension associated with diabetes [facility])    Past Surgical History  Procedure Laterality Date  . Back surgery    . Hip surgery      staph infection  . Frontal closed sutures to forehead    . Lumbar fusion    . Harrington rod placement    . Cervical fusion     Family History  Problem Relation Age of Onset  . Cancer Maternal Grandmother     mandibular cancer   . Diabetes Mother   . Diabetes Maternal Aunt   . Heart failure Mother   . CAD Mother     triple CABG  . Stroke Father    Social History  Substance Use Topics  . Smoking status: Never Smoker   . Smokeless tobacco: None  . Alcohol Use: No   OB History    No data available     Review of Systems  Constitutional: Negative.   HENT: Negative.   Eyes: Negative.   Respiratory: Negative.   Cardiovascular: Negative.   Genitourinary: Negative.   Musculoskeletal: Positive for back pain.  Skin: Negative.   Allergic/Immunologic: Negative.   Neurological: Negative.   Hematological: Negative.   Psychiatric/Behavioral: Negative.     Allergies  Ultrasound cream  Home Medications   Prior to Admission medications   Medication Sig Start Date End Date Taking? Authorizing Provider  insulin aspart  (NOVOLOG) 100 UNIT/ML injection Inject 14 Units into the skin 3 (three) times daily before meals. Per sliding scale or as directed by physician 08/21/12  Yes Clanford Marisa Hua, MD  insulin glargine (LANTUS) 100 UNIT/ML injection Inject 12-26 Units into the skin at bedtime. 08/21/12  Yes Clanford Marisa Hua, MD  lisinopril (PRINIVIL,ZESTRIL) 30 MG tablet Take 1 tablet (30 mg total) by mouth daily. 08/31/12  Yes Clanford Marisa Hua, MD  metFORMIN (GLUCOPHAGE) 500 MG tablet Take 1 tablet (500 mg total) by mouth daily with breakfast. 08/21/12  Yes Clanford Marisa Hua, MD  ALPRAZolam Duanne Moron) 0.5 MG tablet Take 1 tablet (0.5 mg total) by mouth 2 (two) times daily as needed. For anxiety. 08/21/12   Clanford Marisa Hua, MD  cephALEXin (KEFLEX) 500 MG capsule Take 1 capsule (500 mg total) by mouth 3 (three) times daily. Take all of medicine and drink lots of fluids 08/04/14   Billy Fischer, MD  gemfibrozil (LOPID) 600 MG tablet Take 1 tablet (600 mg total) by mouth 2 (two) times daily. 08/31/12   Clanford Marisa Hua, MD  HYDROcodone-acetaminophen (NORCO/VICODIN) 5-325 MG per tablet Take 1 tablet by mouth every 6 (six) hours as needed for moderate pain or severe pain. 08/04/14   Billy Fischer, MD  sertraline (ZOLOFT) 100 MG tablet Take 1 tablet (  100 mg total) by mouth daily. 08/31/12   Clanford Marisa Hua, MD   Meds Ordered and Administered this Visit  Medications - No data to display  BP 148/75 mmHg  Pulse 78  Temp(Src) 97.8 F (36.6 C) (Oral)  SpO2 100% No data found.   Physical Exam  Constitutional: She is oriented to person, place, and time. She appears well-developed and well-nourished.  HENT:  Head: Normocephalic and atraumatic.  Right Ear: External ear normal.  Left Ear: External ear normal.  Eyes: Conjunctivae and EOM are normal. Pupils are equal, round, and reactive to light.  Neck: Normal range of motion. Neck supple.  Cardiovascular: Normal rate, regular rhythm and normal heart sounds.   Pulmonary/Chest:  Effort normal and breath sounds normal.  Abdominal: Soft. Bowel sounds are normal.  Musculoskeletal: She exhibits edema and tenderness.  TTP left lumbar paraspinous muscles.  Negative Homan's left LE.  Tenderness left sciatic notch. Bilateral LE edema and pretibial edema.  Neurological: She is alert and oriented to person, place, and time.    ED Course  Procedures (including critical care time)  Labs Review Labs Reviewed - No data to display  Imaging Review No results found.   Visual Acuity Review  Right Eye Distance:   Left Eye Distance:   Bilateral Distance:    Right Eye Near:   Left Eye Near:    Bilateral Near:         MDM  Sciatica Left side - Prednisone 10 mg 4 x 2 days then 2po qd x 2d then 1 po qd x 2d then stop Tizanidine 4mg  one po q 6 hours prn #30 Toradol 60mg  IM  Follow up with PCP PRN  [place], FNP 06/06/15 1640

## 2015-06-06 NOTE — ED Notes (Signed)
C/o left leg pain and swelling onset 1 week; also reports left arm swelling Pain increases w/activity Denies inj/trauma A&O x4... No acute distress.

## 2015-06-11 ENCOUNTER — Encounter (HOSPITAL_COMMUNITY): Payer: Self-pay

## 2015-06-11 ENCOUNTER — Emergency Department (HOSPITAL_COMMUNITY)
Admission: EM | Admit: 2015-06-11 | Discharge: 2015-06-11 | Disposition: A | Discharge status: Home / Self Care | Payer: Medicaid Other | Source: Home / Self Care | Attending: Family Medicine | Admitting: Family Medicine

## 2015-06-11 DIAGNOSIS — G8929 Other chronic pain: Secondary | ICD-10-CM | POA: Diagnosis not present

## 2015-06-11 DIAGNOSIS — M5416 Radiculopathy, lumbar region: Secondary | ICD-10-CM

## 2015-06-11 NOTE — Discharge Instructions (Signed)
Chronic Pain Chronic pain can be defined as pain that is off and on and lasts for 3-6 months or longer. Many things cause chronic pain, which can make it difficult to make a diagnosis. There are many treatment options available for chronic pain. However, finding a treatment that works well for you may require trying various approaches until the right one is found. Many people benefit from a combination of two or more types of treatment to control their pain. SYMPTOMS  Chronic pain can occur anywhere in the body and can range from mild to very severe. Some types of chronic pain include:  Headache.  Low back pain.  Cancer pain.  Arthritis pain.  Neurogenic pain. This is pain resulting from damage to nerves. People with chronic pain may also have other symptoms such as:  Depression.  Anger.  Insomnia.  Anxiety. DIAGNOSIS  Your health care provider will help diagnose your condition over time. In many cases, the initial focus will be on excluding possible conditions that could be causing the pain. Depending on your symptoms, your health care provider may order tests to diagnose your condition. Some of these tests may include:   Blood tests.   CT scan.   MRI.   X-rays.   Ultrasounds.   Nerve conduction studies.  You may need to see a specialist.  TREATMENT  Finding treatment that works well may take time. You may be referred to a pain specialist. He or she may prescribe medicine or therapies, such as:   Mindful meditation or yoga.  Shots (injections) of numbing or pain-relieving medicines into the spine or area of pain.  Local electrical stimulation.  Acupuncture.   Massage therapy.   Aroma, color, light, or sound therapy.   Biofeedback.   Working with a physical therapist to keep from getting stiff.   Regular, gentle exercise.   Cognitive or behavioral therapy.   Group support.  Sometimes, surgery may be recommended.  HOME CARE INSTRUCTIONS    Take all medicines as directed by your health care provider.   Lessen stress in your life by relaxing and doing things such as listening to calming music.   Exercise or be active as directed by your health care provider.   Eat a healthy diet and include things such as vegetables, fruits, fish, and lean meats in your diet.   Keep all follow-up appointments with your health care provider.   Attend a support group with others suffering from chronic pain. SEEK MEDICAL CARE IF:   Your pain gets worse.   You develop a new pain that was not there before.   You cannot tolerate medicines given to you by your health care provider.   You have new symptoms since your last visit with your health care provider.  SEEK IMMEDIATE MEDICAL CARE IF:   You feel weak.   You have decreased sensation or numbness.   You lose control of bowel or bladder function.   Your pain suddenly gets much worse.   You develop shaking.  You develop chills.  You develop confusion.  You develop chest pain.  You develop shortness of breath.  MAKE SURE YOU:  Understand these instructions.  Will watch your condition.  Will get help right away if you are not doing well or get worse.   This information is not intended to replace advice given to you by your health care provider. Make sure you discuss any questions you have with your health care provider.   Cannot prescribe additional  narcotics when you are receiving them. Suggest you call your regular physician in the am for addition pain medication.    Document Released: 04/06/2002 Document Revised: 03/17/2013 Document Reviewed: 01/08/2013 Elsevier Interactive Patient Education [employer].

## 2015-06-11 NOTE — ED Provider Notes (Signed)
CSN: [id number]     Arrival date & time 06/11/15  1509 History   First MD Initiated Contact with Patient 06/11/15 1602     Chief Complaint  Patient presents with  . Claudication   (Consider location/radiation/quality/duration/timing/severity/associated sxs/prior Treatment) HPI Comments: Patient presents today with ongoing pain in the left leg. She carries a history of chronic LBP and radiculopathy. She had a MRI on 10/28 and is awaiting the results. Her pain is worse. She was seen here 1 week ago and given a Toradol injection and steroids which were not helpful. She denies having anything for pain in some time. She denies weakness and no loss of bowel or bladder control is noted.   The history is provided by the patient.    Past Medical History  Diagnosis Date  . Scoliosis   . Type 2 diabetes mellitus treated with insulin ([place])   . Renal disorder   . Hyperlipidemia   . Hypertension associated with diabetes [facility])    Past Surgical History  Procedure Laterality Date  . Back surgery    . Hip surgery      staph infection  . Frontal closed sutures to forehead    . Lumbar fusion    . Harrington rod placement    . Cervical fusion     Family History  Problem Relation Age of Onset  . Cancer Maternal Grandmother     mandibular cancer   . Diabetes Mother   . Diabetes Maternal Aunt   . Heart failure Mother   . CAD Mother     triple CABG  . Stroke Father    Social History  Substance Use Topics  . Smoking status: Never Smoker   . Smokeless tobacco: None  . Alcohol Use: No   OB History    No data available     Review of Systems  All other systems reviewed and are negative.   Allergies  Ultrasound cream  Home Medications   Prior to Admission medications   Medication Sig Start Date End Date Taking? Authorizing Provider  ALPRAZolam Duanne Moron) 0.5 MG tablet Take 1 tablet (0.5 mg total) by mouth 2 (two) times daily as needed. For anxiety. 08/21/12   Clanford Marisa Hua, MD   cephALEXin (KEFLEX) 500 MG capsule Take 1 capsule (500 mg total) by mouth 3 (three) times daily. Take all of medicine and drink lots of fluids 08/04/14   Billy Fischer, MD  gemfibrozil (LOPID) 600 MG tablet Take 1 tablet (600 mg total) by mouth 2 (two) times daily. 08/31/12   Clanford Marisa Hua, MD  HYDROcodone-acetaminophen (NORCO/VICODIN) 5-325 MG per tablet Take 1 tablet by mouth every 6 (six) hours as needed for moderate pain or severe pain. 08/04/14   Billy Fischer, MD  insulin aspart (NOVOLOG) 100 UNIT/ML injection Inject 14 Units into the skin 3 (three) times daily before meals. Per sliding scale or as directed by physician 08/21/12   Murlean Iba, MD  insulin glargine (LANTUS) 100 UNIT/ML injection Inject 12-26 Units into the skin at bedtime. 08/21/12   Clanford Marisa Hua, MD  lisinopril (PRINIVIL,ZESTRIL) 30 MG tablet Take 1 tablet (30 mg total) by mouth daily. 08/31/12   Clanford Marisa Hua, MD  metFORMIN (GLUCOPHAGE) 500 MG tablet Take 1 tablet (500 mg total) by mouth daily with breakfast. 08/21/12   Clanford Marisa Hua, MD  predniSONE (DELTASONE) 10 MG tablet Take 4 po qd x 2 days then 2 po qd x 2 days then one po qd x  2 days then stop 06/06/15   Lysbeth Penner, FNP  sertraline (ZOLOFT) 100 MG tablet Take 1 tablet (100 mg total) by mouth daily. 08/31/12   Clanford Marisa Hua, MD  tiZANidine (ZANAFLEX) 4 MG tablet Take 1 tablet (4 mg total) by mouth every 6 (six) hours as needed for muscle spasms. 06/06/15   Lysbeth Penner, FNP   Meds Ordered and Administered this Visit  Medications - No data to display  BP 156/83 mmHg  Pulse 84  Temp(Src) 98.1 F (36.7 C) (Oral)  SpO2 98% No data found.   Physical Exam  Constitutional: She is oriented to person, place, and time. She appears well-developed and well-nourished.  Tearful  Pulmonary/Chest: Effort normal.  Musculoskeletal: She exhibits tenderness. She exhibits no edema.  Decreased and painful ROM of the lumbar spine without good effort.  Non-tender  Neurological: She is alert and oriented to person, place, and time. She displays normal reflexes. She exhibits normal muscle tone. Coordination normal.  Skin: Skin is warm and dry.  Psychiatric: Her behavior is normal.  Nursing note and vitals reviewed.   ED Course  Procedures (including critical care time)  Labs Review Labs Reviewed - No data to display  Imaging Review No results found.   Visual Acuity Review  Right Eye Distance:   Left Eye Distance:   Bilateral Distance:    Right Eye Near:   Left Eye Near:    Bilateral Near:         MDM   1. Chronic pain   2. Lumbar radiculopathy, chronic    1. MRI of the lumbar spine on 10/28 shows advanced DDD without nerve impingement. Exam shows no neuro deficit. [place] [facility] shows Morphine (30 day supply) and Oxycodone (120 tablets) filled on 10/25. At this time no additional pain medication will be prescribed from [name] today. Offered Toradol but patient declined. She failed recent steroid. Suggest she f/u with her usual pain clinic/ spine specialist.     Bjorn Pippin, PA-C 06/11/15 1653

## 2015-06-11 NOTE — ED Notes (Signed)
Patient complains of having left sided pain from her hip all the way down her leg Patient states she was here during the week for the same thing but the pain is  Worse

## 2015-08-13 ENCOUNTER — Encounter (HOSPITAL_COMMUNITY): Payer: Self-pay

## 2015-08-13 ENCOUNTER — Emergency Department (INDEPENDENT_AMBULATORY_CARE_PROVIDER_SITE_OTHER)
Admission: EM | Admit: 2015-08-13 | Discharge: 2015-08-13 | Disposition: A | Discharge status: Home / Self Care | Payer: Medicaid Other | Source: Home / Self Care | Attending: Family Medicine | Admitting: Family Medicine

## 2015-08-13 DIAGNOSIS — L03311 Cellulitis of abdominal wall: Secondary | ICD-10-CM | POA: Diagnosis not present

## 2015-08-13 MED ORDER — CEPHALEXIN 500 MG PO CAPS: 500.0000 mg | ORAL_CAPSULE | Freq: Four times a day (QID) | ORAL | Status: DC

## 2015-08-13 MED ORDER — OXYCODONE-ACETAMINOPHEN 5-325 MG PO TABS: 2.0000 | ORAL_TABLET | ORAL | Status: DC | PRN

## 2015-08-13 NOTE — ED Notes (Signed)
Patient complains of having a bug bite to her lower abd That she noticed about a week ago Area is red hard and hot to the touch with a scab in the center of the area Some discharge at times

## 2015-08-13 NOTE — ED Provider Notes (Signed)
CSN: [id number]     Arrival date & time 08/13/15  1707 History   First MD Initiated Contact with Patient 08/13/15 1820     Chief Complaint  Patient presents with  . Insect Bite   (Consider location/radiation/quality/duration/timing/severity/associated sxs/prior Treatment) The history is provided by the patient. No language interpreter was used.  Ulcer: Here for ulcer on her belly which started off with itching and as a small bump. This started 1 wk ago but worsened in the last two days. Initially it was a tiny bump which grew bigger gradually in the last 2-3 days and now it is hard and painful. Pain is about 9/10 in severity. She denies fever at home. She denies previous episode. Uncertain if she had a bite on the area. She takes dilaudid 8mg  as needed for pain.  Past Medical History  Diagnosis Date  . Scoliosis   . Type 2 diabetes mellitus treated with insulin ([place])   . Renal disorder   . Hyperlipidemia   . Hypertension associated with diabetes [facility])    Past Surgical History  Procedure Laterality Date  . Back surgery    . Hip surgery      staph infection  . Frontal closed sutures to forehead    . Lumbar fusion    . Harrington rod placement    . Cervical fusion     Family History  Problem Relation Age of Onset  . Cancer Maternal Grandmother     mandibular cancer   . Diabetes Mother   . Diabetes Maternal Aunt   . Heart failure Mother   . CAD Mother     triple CABG  . Stroke Father    Social History  Substance Use Topics  . Smoking status: Never Smoker   . Smokeless tobacco: None  . Alcohol Use: No   OB History    No data available     Review of Systems  Respiratory: Negative.   Cardiovascular: Negative.   Gastrointestinal: Negative.   Skin: Positive for wound.  All other systems reviewed and are negative.   Allergies  Ultrasound cream  Home Medications   Prior to Admission medications   Medication Sig Start Date End Date Taking? Authorizing Provider    ALPRAZolam Duanne Moron) 0.5 MG tablet Take 1 tablet (0.5 mg total) by mouth 2 (two) times daily as needed. For anxiety. 08/21/12   Clanford Marisa Hua, MD  cephALEXin (KEFLEX) 500 MG capsule Take 1 capsule (500 mg total) by mouth 3 (three) times daily. Take all of medicine and drink lots of fluids 08/04/14   Billy Fischer, MD  gemfibrozil (LOPID) 600 MG tablet Take 1 tablet (600 mg total) by mouth 2 (two) times daily. 08/31/12   Clanford Marisa Hua, MD  HYDROcodone-acetaminophen (NORCO/VICODIN) 5-325 MG per tablet Take 1 tablet by mouth every 6 (six) hours as needed for moderate pain or severe pain. 08/04/14   Billy Fischer, MD  insulin aspart (NOVOLOG) 100 UNIT/ML injection Inject 14 Units into the skin 3 (three) times daily before meals. Per sliding scale or as directed by physician 08/21/12   Murlean Iba, MD  insulin glargine (LANTUS) 100 UNIT/ML injection Inject 12-26 Units into the skin at bedtime. 08/21/12   Clanford Marisa Hua, MD  lisinopril (PRINIVIL,ZESTRIL) 30 MG tablet Take 1 tablet (30 mg total) by mouth daily. 08/31/12   Clanford Marisa Hua, MD  metFORMIN (GLUCOPHAGE) 500 MG tablet Take 1 tablet (500 mg total) by mouth daily with breakfast. 08/21/12   Clanford  Marisa Hua, MD  predniSONE (DELTASONE) 10 MG tablet Take 4 po qd x 2 days then 2 po qd x 2 days then one po qd x 2 days then stop 06/06/15   Lysbeth Penner, FNP  sertraline (ZOLOFT) 100 MG tablet Take 1 tablet (100 mg total) by mouth daily. 08/31/12   Clanford Marisa Hua, MD  tiZANidine (ZANAFLEX) 4 MG tablet Take 1 tablet (4 mg total) by mouth every 6 (six) hours as needed for muscle spasms. 06/06/15   Lysbeth Penner, FNP   Meds Ordered and Administered this Visit  Medications - No data to display  BP 138/83 mmHg  Pulse 108  Temp(Src) 100 F (37.8 C) (Oral)  SpO2 96% No data found.   Physical Exam  Constitutional: She appears well-developed. No distress.  Cardiovascular: Normal rate, regular rhythm and normal heart sounds.   No  murmur heard. Pulmonary/Chest: Effort normal and breath sounds normal. No respiratory distress. She has no wheezes.  Abdominal: Soft. Bowel sounds are normal. She exhibits no distension and no mass. There is no tenderness.  Skin:     Nursing note and vitals reviewed.   ED Course  Procedures (including critical care time)  Labs Review Labs Reviewed - No data to display  Imaging Review No results found.   Visual Acuity Review  Right Eye Distance:   Left Eye Distance:   Bilateral Distance:    Right Eye Near:   Left Eye Near:    Bilateral Near:         MDM  No diagnosis found. Cellulitis of abdominal wall  Uncertain if she had insect bite or not, however it does look like she has skin infection. I tried to express pus or fluid but there was not drainage. Percocet prescribed prn pain. Keflex prescribed for skin infection. Wound cleaned and dressed with gauze. F/U in 1 wk for reassessment or sooner if worsening.    Kinnie Feil, MD 08/13/15 [phone]

## 2015-08-13 NOTE — Discharge Instructions (Signed)
F/U in 1 wk for wound care and reassessment Cellulitis Cellulitis is an infection of the skin and the tissue under the skin. The infected area is usually red and tender. This happens most often in the arms and lower legs. HOME CARE   Take your antibiotic medicine as told. Finish the medicine even if you start to feel better.  Keep the infected arm or leg raised (elevated).  Put a warm cloth on the area up to 4 times per day.  Only take medicines as told by your doctor.  Keep all doctor visits as told. GET HELP IF:  You see red streaks on the skin coming from the infected area.  Your red area gets bigger or turns a dark color.  Your bone or joint under the infected area is painful after the skin heals.  Your infection comes back in the same area or different area.  You have a puffy (swollen) bump in the infected area.  You have new symptoms.  You have a fever. GET HELP RIGHT AWAY IF:   You feel very sleepy.  You throw up (vomit) or have watery poop (diarrhea).  You feel sick and have muscle aches and pains.   This information is not intended to replace advice given to you by your health care provider. Make sure you discuss any questions you have with your health care provider.   Document Released: 01/01/2008 Document Revised: 04/05/2015 Document Reviewed: 09/30/2011 Elsevier Interactive Patient Education [employer].

## 2015-09-21 ENCOUNTER — Encounter: Payer: Self-pay | Admitting: Family Medicine

## 2015-09-21 ENCOUNTER — Ambulatory Visit: Payer: Medicaid Other | Attending: Family Medicine | Admitting: Family Medicine

## 2015-09-21 VITALS — BP 141/87 | HR 99 | Temp 99.3°F | Resp 16 | Ht <= 58 in | Wt 234.0 lb

## 2015-09-21 DIAGNOSIS — Z8249 Family history of ischemic heart disease and other diseases of the circulatory system: Secondary | ICD-10-CM | POA: Diagnosis not present

## 2015-09-21 DIAGNOSIS — G4700 Insomnia, unspecified: Secondary | ICD-10-CM | POA: Insufficient documentation

## 2015-09-21 DIAGNOSIS — Z794 Long term (current) use of insulin: Secondary | ICD-10-CM | POA: Diagnosis not present

## 2015-09-21 DIAGNOSIS — Z79899 Other long term (current) drug therapy: Secondary | ICD-10-CM | POA: Diagnosis not present

## 2015-09-21 DIAGNOSIS — M4120 Other idiopathic scoliosis, site unspecified: Secondary | ICD-10-CM | POA: Diagnosis not present

## 2015-09-21 DIAGNOSIS — J0110 Acute frontal sinusitis, unspecified: Secondary | ICD-10-CM | POA: Insufficient documentation

## 2015-09-21 DIAGNOSIS — M419 Scoliosis, unspecified: Secondary | ICD-10-CM | POA: Diagnosis not present

## 2015-09-21 DIAGNOSIS — E785 Hyperlipidemia, unspecified: Secondary | ICD-10-CM | POA: Diagnosis not present

## 2015-09-21 DIAGNOSIS — Z9889 Other specified postprocedural states: Secondary | ICD-10-CM | POA: Insufficient documentation

## 2015-09-21 DIAGNOSIS — I10 Essential (primary) hypertension: Secondary | ICD-10-CM | POA: Diagnosis not present

## 2015-09-21 DIAGNOSIS — Z7984 Long term (current) use of oral hypoglycemic drugs: Secondary | ICD-10-CM | POA: Insufficient documentation

## 2015-09-21 DIAGNOSIS — E1165 Type 2 diabetes mellitus with hyperglycemia: Secondary | ICD-10-CM | POA: Diagnosis not present

## 2015-09-21 DIAGNOSIS — G894 Chronic pain syndrome: Secondary | ICD-10-CM | POA: Diagnosis present

## 2015-09-21 DIAGNOSIS — Z114 Encounter for screening for human immunodeficiency virus [HIV]: Secondary | ICD-10-CM | POA: Diagnosis not present

## 2015-09-21 DIAGNOSIS — E1129 Type 2 diabetes mellitus with other diabetic kidney complication: Secondary | ICD-10-CM

## 2015-09-21 DIAGNOSIS — IMO0002 Reserved for concepts with insufficient information to code with codable children: Secondary | ICD-10-CM

## 2015-09-21 LAB — GLUCOSE, POCT (MANUAL RESULT ENTRY): POC GLUCOSE: 271 mg/dL — AB (ref 70–99)

## 2015-09-21 LAB — POCT GLYCOSYLATED HEMOGLOBIN (HGB A1C): Hemoglobin A1C: 10.8

## 2015-09-21 MED ORDER — ACCU-CHEK AVIVA PLUS W/DEVICE KIT
1.0000 | PACK | Freq: Three times a day (TID) | Status: AC
Start: 2015-09-21 — End: ?

## 2015-09-21 MED ORDER — METFORMIN HCL ER 500 MG PO TB24: 500.0000 mg | ORAL_TABLET | Freq: Two times a day (BID) | ORAL | Status: DC

## 2015-09-21 MED ORDER — ACETAMINOPHEN-CODEINE #3 300-30 MG PO TABS
1.0000 | ORAL_TABLET | Freq: Three times a day (TID) | ORAL | Status: DC | PRN
Start: 2015-09-21 — End: 2015-10-18

## 2015-09-21 MED ORDER — GLUCOSE BLOOD VI STRP: 1.0000 | ORAL_STRIP | Freq: Three times a day (TID) | Status: AC

## 2015-09-21 MED ORDER — ACCU-CHEK SOFTCLIX LANCETS MISC: 1.0000 | Freq: Three times a day (TID) | Status: AC

## 2015-09-21 MED ORDER — LISINOPRIL 30 MG PO TABS: 30.0000 mg | ORAL_TABLET | Freq: Every day | ORAL | Status: DC

## 2015-09-21 MED ORDER — ACCU-CHEK SOFTCLIX LANCET DEV MISC: 1.0000 | Freq: Three times a day (TID) | Status: AC

## 2015-09-21 MED ORDER — ALPRAZOLAM 0.5 MG PO TABS: 0.5000 mg | ORAL_TABLET | Freq: Every evening | ORAL | Status: DC | PRN

## 2015-09-21 MED ORDER — ALBUTEROL SULFATE HFA 108 (90 BASE) MCG/ACT IN AERS: 1.0000 | INHALATION_SPRAY | Freq: Four times a day (QID) | RESPIRATORY_TRACT | Status: DC | PRN

## 2015-09-21 MED ORDER — PREGABALIN 100 MG PO CAPS: 100.0000 mg | ORAL_CAPSULE | Freq: Two times a day (BID) | ORAL | Status: DC

## 2015-09-21 MED ORDER — TIZANIDINE HCL 4 MG PO TABS
4.0000 mg | ORAL_TABLET | Freq: Three times a day (TID) | ORAL | Status: DC | PRN
Start: 2015-09-21 — End: 2016-08-22

## 2015-09-21 MED ORDER — INSULIN ASPART 100 UNIT/ML ~~LOC~~ SOLN: SUBCUTANEOUS | Status: DC

## 2015-09-21 MED ORDER — AMOXICILLIN 500 MG PO CAPS: 500.0000 mg | ORAL_CAPSULE | Freq: Three times a day (TID) | ORAL | Status: DC

## 2015-09-21 MED ORDER — INSULIN GLARGINE 100 UNIT/ML ~~LOC~~ SOLN: 20.0000 [IU] | Freq: Every day | SUBCUTANEOUS | Status: DC

## 2015-09-21 NOTE — Assessment & Plan Note (Signed)
uncontrolled diabetes due to med non compliance Refilled meds Changed lantus for sliding scale to set dose

## 2015-09-21 NOTE — Assessment & Plan Note (Signed)
Insomnia in setting of chronic pain Xanax q HS

## 2015-09-21 NOTE — Assessment & Plan Note (Signed)
amox for acute sinusitis

## 2015-09-21 NOTE — Patient Instructions (Addendum)
Briana Williams was seen today for diabetes.  Diagnoses and all orders for this visit:  Diabetes mellitus type 2 in obese (HCC) -     HgB A1c -     Glucose (CBG) -     Microalbumin/Creatinine Ratio, Urine -     insulin aspart (NOVOLOG) 100 UNIT/ML injection; 12-20 per sliding scale -     insulin glargine (LANTUS) 100 UNIT/ML injection; Inject 0.2 mLs (20 Units total) into the skin at bedtime. -     lisinopril (PRINIVIL,ZESTRIL) 30 MG tablet; Take 1 tablet (30 mg total) by mouth daily. -     COMPLETE METABOLIC PANEL WITH GFR -     Lipid Panel -     Blood Glucose Monitoring Suppl (ACCU-CHEK AVIVA PLUS) w/Device KIT; 1 Device by Does not apply route 3 (three) times daily after meals. ICD 10 E11.9 -     Lancet Devices (ACCU-CHEK SOFTCLIX) lancets; 1 each by Other route 3 (three) times daily. ICD 10 E11.9 -     ACCU-CHEK SOFTCLIX LANCETS lancets; 1 each by Other route 3 (three) times daily. ICD 10 E11.9 -     glucose blood (ACCU-CHEK AVIVA PLUS) test strip; 1 each by Other route 3 (three) times daily. ICD 10 E11.9 -     Ambulatory referral to Ophthalmology -     metFORMIN (GLUCOPHAGE-XR) 500 MG 24 hr tablet; Take 1 tablet (500 mg total) by mouth 2 (two) times daily.  INSOMNIA -     ALPRAZolam (XANAX) 0.5 MG tablet; Take 1-2 tablets (0.5-1 mg total) by mouth at bedtime as needed for sleep. For anxiety.  SCOLIOSIS -     albuterol (PROVENTIL HFA;VENTOLIN HFA) 108 (90 Base) MCG/ACT inhaler; Inhale 1 puff into the lungs every 6 (six) hours as needed for wheezing or shortness of breath. -     pregabalin (LYRICA) 100 MG capsule; Take 1 capsule (100 mg total) by mouth 2 (two) times daily.  Chronic pain syndrome -     pregabalin (LYRICA) 100 MG capsule; Take 1 capsule (100 mg total) by mouth 2 (two) times daily. -     tiZANidine (ZANAFLEX) 4 MG tablet; Take 1 tablet (4 mg total) by mouth every 8 (eight) hours as needed for muscle spasms. -     acetaminophen-codeine (TYLENOL #3) 300-30 MG tablet; Take 1  tablet by mouth every 8 (eight) hours as needed for moderate pain.  Screening for HIV (human immunodeficiency virus) -     HIV antibody (with reflex)  Acute frontal sinusitis, recurrence not specified -     amoxicillin (AMOXIL) 500 MG capsule; Take 1 capsule (500 mg total) by mouth 3 (three) times daily.    Diabetes blood sugar goals  Fasting (in AM before breakfast, 8 hrs of no eating or drinking (except water or unsweetened coffee or tea): 90-110 2 hrs after meals: < 160,   No low sugars: nothing < 70   F/u in 4-6 weeks for pap smear   Dr. Adrian Blackwater

## 2015-09-21 NOTE — Progress Notes (Signed)
LOGO@  Subjective:  Patient ID: Briana Williams, female    DOB: 06-26-1981  Age: 35 y.o. MRN: [id number]  CC: Diabetes   HPI NEVENA ROZENBERG presents for   1. CHRONIC DIABETES  Disease Monitoring  Blood Sugar Ranges: 300s lately   Polyuria: no   Visual problems: no   Medication Compliance: no, out of meds for past 3-4 weeks   Medication Side Effects  Hypoglycemia: no   Preventitive Health Care  Eye Exam: due   Foot Exam: done today     2. Chronic pain: neck, L shoulder, lower back. Has scoliosis and has had over 40 surgeries. In between pain clinics. Was discharged from pain management in [place], [place] due to her pill count being off. Has been referred by her orthopaedic to a pain management center in [place]. Previously on dilaudid, zanaflex and lyrica. Pain is chronic and daily. Pain interferes with sleep.   3. Congestion: x one week. No improvement. With dry cough and sinus pressure. Subjective fever 2 days ago. Son is sick at home x one day.   Social History  Substance Use Topics  . Smoking status: Never Smoker   . Smokeless tobacco: Not on file  . Alcohol Use: No    Past Medical History  Diagnosis Date  . Scoliosis   . Type 2 diabetes mellitus treated with insulin ([place])   . Renal disorder   . Hyperlipidemia   . Hypertension associated with diabetes [facility])     Past Surgical History  Procedure Laterality Date  . Back surgery    . Hip surgery      staph infection  . Frontal closed sutures to forehead    . Lumbar fusion    . Harrington rod placement    . Cervical fusion      Family History  Problem Relation Age of Onset  . Cancer Maternal Grandmother     mandibular cancer   . Diabetes Mother   . Diabetes Maternal Aunt   . Heart failure Mother   . CAD Mother     triple CABG  . Stroke Father     Social History  Substance Use Topics  . Smoking status: Never Smoker   . Smokeless tobacco: Not on file  . Alcohol Use: No     ROS Review of Systems  Constitutional: Negative for fever and chills.  HENT: Positive for congestion and sinus pressure.   Eyes: Negative for visual disturbance.  Respiratory: Positive for cough. Negative for shortness of breath.   Cardiovascular: Negative for chest pain.  Gastrointestinal: Negative for abdominal pain and blood in stool.  Musculoskeletal: Positive for back pain, arthralgias and gait problem.  Skin: Negative for rash.  Allergic/Immunologic: Negative for immunocompromised state.  Neurological: Positive for headaches.  Hematological: Negative for adenopathy. Does not bruise/bleed easily.  Psychiatric/Behavioral: Negative for suicidal ideas and dysphoric mood.    Objective:   Today's Vitals: BP 141/87 mmHg  Pulse 99  Temp(Src) 99.3 F (37.4 C) (Oral)  Resp 16  Ht _0  (1.372 m)  Wt 234 lb (106.142 kg)  BMI 56.39 kg/m2  SpO2 97%  Physical Exam  Constitutional: She is oriented to person, place, and time. She appears well-developed and well-nourished. No distress.  HENT:  Head: Normocephalic and atraumatic.  Nose: Mucosal edema present. Right sinus exhibits maxillary sinus tenderness and frontal sinus tenderness. Left sinus exhibits no maxillary sinus tenderness and no frontal sinus tenderness.  Mouth/Throat:    Cardiovascular: Normal rate, regular rhythm, normal  heart sounds and intact distal pulses.   Pulmonary/Chest: Effort normal and breath sounds normal.  Musculoskeletal: She exhibits no edema.  Neurological: She is alert and oriented to person, place, and time.  Skin: Skin is warm and dry. No rash noted.  Psychiatric: She has a normal mood and affect.   Lab Results  Component Value Date   HGBA1C 10.8 09/21/2015   CBG  271   Assessment & Plan:   Problem List Items Addressed This Visit    SCOLIOSIS (Chronic)   Relevant Medications   albuterol (PROVENTIL HFA;VENTOLIN HFA) 108 (90 Base) MCG/ACT inhaler   pregabalin (LYRICA) 100 MG capsule    INSOMNIA (Chronic)   Relevant Medications   ALPRAZolam (XANAX) 0.5 MG tablet   Diabetes mellitus type 2 in obese (HCC) - Primary (Chronic)   Relevant Medications   insulin aspart (NOVOLOG) 100 UNIT/ML injection   insulin glargine (LANTUS) 100 UNIT/ML injection   lisinopril (PRINIVIL,ZESTRIL) 30 MG tablet   Blood Glucose Monitoring Suppl (ACCU-CHEK AVIVA PLUS) w/Device KIT   Lancet Devices (ACCU-CHEK SOFTCLIX) lancets   ACCU-CHEK SOFTCLIX LANCETS lancets   glucose blood (ACCU-CHEK AVIVA PLUS) test strip   metFORMIN (GLUCOPHAGE-XR) 500 MG 24 hr tablet   Other Relevant Orders   HgB A1c (Completed)   Glucose (CBG) (Completed)   Microalbumin/Creatinine Ratio, Urine   COMPLETE METABOLIC PANEL WITH GFR   Lipid Panel   Ambulatory referral to Ophthalmology   Chronic pain syndrome (Chronic)   Relevant Medications   pregabalin (LYRICA) 100 MG capsule   tiZANidine (ZANAFLEX) 4 MG tablet   acetaminophen-codeine (TYLENOL #3) 300-30 MG tablet    Other Visit Diagnoses    Screening for HIV (human immunodeficiency virus)        Relevant Orders    HIV antibody (with reflex)    Acute frontal sinusitis, recurrence not specified        Relevant Medications    amoxicillin (AMOXIL) 500 MG capsule       Outpatient Encounter Prescriptions as of 09/21/2015  Medication Sig  . albuterol (PROVENTIL HFA;VENTOLIN HFA) 108 (90 Base) MCG/ACT inhaler Inhale into the lungs every 6 (six) hours as needed for wheezing or shortness of breath.  Marland Kitchen gemfibrozil (LOPID) 600 MG tablet Take 1 tablet (600 mg total) by mouth 2 (two) times daily.  . insulin aspart (NOVOLOG) 100 UNIT/ML injection Inject 14 Units into the skin 3 (three) times daily before meals. Per sliding scale or as directed by physician  . insulin glargine (LANTUS) 100 UNIT/ML injection Inject 12-26 Units into the skin at bedtime.  . pregabalin (LYRICA) 100 MG capsule Take 100 mg by mouth 2 (two) times daily.  Marland Kitchen tiZANidine (ZANAFLEX) 4 MG tablet Take 1  tablet (4 mg total) by mouth every 6 (six) hours as needed for muscle spasms.  . ALPRAZolam (XANAX) 0.5 MG tablet Take 1 tablet (0.5 mg total) by mouth 2 (two) times daily as needed. For anxiety. (Patient not taking: Reported on 09/21/2015)  . cephALEXin (KEFLEX) 500 MG capsule Take 1 capsule (500 mg total) by mouth 4 (four) times daily. Take all of medicine and drink lots of fluids (Patient not taking: Reported on 09/21/2015)  . HYDROcodone-acetaminophen (NORCO/VICODIN) 5-325 MG per tablet Take 1 tablet by mouth every 6 (six) hours as needed for moderate pain or severe pain. (Patient not taking: Reported on 09/21/2015)  . lisinopril (PRINIVIL,ZESTRIL) 30 MG tablet Take 1 tablet (30 mg total) by mouth daily. (Patient not taking: Reported on 09/21/2015)  . metFORMIN (GLUCOPHAGE) 500  MG tablet Take 1 tablet (500 mg total) by mouth daily with breakfast. (Patient not taking: Reported on 09/21/2015)  . oxyCODONE-acetaminophen (PERCOCET/ROXICET) 5-325 MG tablet Take 2 tablets by mouth every 4 (four) hours as needed for severe pain. (Patient not taking: Reported on 09/21/2015)  . predniSONE (DELTASONE) 10 MG tablet Take 4 po qd x 2 days then 2 po qd x 2 days then one po qd x 2 days then stop (Patient not taking: Reported on 09/21/2015)  . sertraline (ZOLOFT) 100 MG tablet Take 1 tablet (100 mg total) by mouth daily. (Patient not taking: Reported on 09/21/2015)   No facility-administered encounter medications on file as of 09/21/2015.    Follow-up: No Follow-up on file.    [name] MD

## 2015-09-21 NOTE — Progress Notes (Signed)
F/U DM

## 2015-09-21 NOTE — Assessment & Plan Note (Signed)
Chronic pain  Tylenol #3 per clinic policy Continue zanaflex and lyrica

## 2015-09-22 ENCOUNTER — Encounter: Payer: Self-pay | Admitting: Family Medicine

## 2015-09-22 ENCOUNTER — Encounter: Payer: Self-pay | Admitting: Clinical

## 2015-09-22 LAB — LIPID PANEL
CHOL/HDL RATIO: 5.3 ratio — AB (ref <=5.0)
CHOLESTEROL: 242 mg/dL — AB (ref 125–200)
HDL: 46 mg/dL (ref >=46)
LDL CALC: 140 mg/dL — AB (ref <130)
Triglycerides: 282 mg/dL — ABNORMAL HIGH (ref <150)
VLDL: 56 mg/dL — AB (ref <30)

## 2015-09-22 LAB — COMPLETE METABOLIC PANEL WITH GFR
ALBUMIN: 3.8 g/dL (ref 3.6–5.1)
ALK PHOS: 72 U/L (ref 33–115)
ALT: 24 U/L (ref 6–29)
AST: 30 U/L (ref 10–30)
BUN: 8 mg/dL (ref 7–25)
CALCIUM: 8.9 mg/dL (ref 8.6–10.2)
CO2: 18 mmol/L — ABNORMAL LOW (ref 20–31)
Chloride: 103 mmol/L (ref 98–110)
Creat: 0.59 mg/dL (ref 0.50–1.10)
GFR, Est African American: >89 mL/min (ref >=60)
Glucose, Bld: 271 mg/dL — ABNORMAL HIGH (ref 65–99)
POTASSIUM: 4 mmol/L (ref 3.5–5.3)
Sodium: 133 mmol/L — ABNORMAL LOW (ref 135–146)
Total Bilirubin: 0.4 mg/dL (ref 0.2–1.2)
Total Protein: 7.6 g/dL (ref 6.1–8.1)

## 2015-09-22 LAB — MICROALBUMIN / CREATININE URINE RATIO
Creatinine, Urine: 92 mg/dL (ref 20–320)
MICROALB/CREAT RATIO: 320 ug/mg{creat} — AB (ref <30)
Microalb, Ur: 29.4 mg/dL

## 2015-09-22 LAB — HIV ANTIBODY (ROUTINE TESTING W REFLEX): HIV: NONREACTIVE

## 2015-09-22 NOTE — Progress Notes (Signed)
Depression screen [facility] 2/9 09/21/2015  Decreased Interest 2  Down, Depressed, Hopeless 1  PHQ - 2 Score 3  Altered sleeping 3  Tired, decreased energy 1  Change in appetite 1  Feeling bad or failure about yourself  1  Trouble concentrating 1  Moving slowly or fidgety/restless 0  Suicidal thoughts 0  PHQ-9 Score 10    GAD 7 : Generalized Anxiety Score 09/21/2015  Nervous, Anxious, on Edge 0  Control/stop worrying 0  Worry too much - different things 0  Trouble relaxing 3  Restless 3  Easily annoyed or irritable 0  Afraid - awful might happen 0  Total GAD 7 Score 6

## 2015-10-05 ENCOUNTER — Encounter: Payer: Self-pay | Admitting: Family Medicine

## 2015-10-05 DIAGNOSIS — G894 Chronic pain syndrome: Secondary | ICD-10-CM

## 2015-10-11 ENCOUNTER — Other Ambulatory Visit: Payer: Self-pay | Admitting: Family Medicine

## 2015-10-18 ENCOUNTER — Other Ambulatory Visit: Payer: Self-pay | Admitting: Family Medicine

## 2015-10-18 DIAGNOSIS — G894 Chronic pain syndrome: Secondary | ICD-10-CM

## 2015-10-18 DIAGNOSIS — G4700 Insomnia, unspecified: Secondary | ICD-10-CM

## 2015-10-19 ENCOUNTER — Other Ambulatory Visit: Payer: Self-pay | Admitting: Family Medicine

## 2015-10-19 DIAGNOSIS — G894 Chronic pain syndrome: Secondary | ICD-10-CM

## 2015-10-19 DIAGNOSIS — G4700 Insomnia, unspecified: Secondary | ICD-10-CM

## 2015-10-19 MED ORDER — ACETAMINOPHEN-CODEINE #3 300-30 MG PO TABS: 1.0000 | ORAL_TABLET | Freq: Three times a day (TID) | ORAL | Status: DC | PRN

## 2015-10-19 MED ORDER — ZOLPIDEM TARTRATE 10 MG PO TABS: 10.0000 mg | ORAL_TABLET | Freq: Every evening | ORAL | Status: DC | PRN

## 2015-10-19 NOTE — Telephone Encounter (Signed)
Briana Williams, please follow up with patient regarding pain management  Dr. Christella Noa, [facility]. Another email that was not routed to my inbox. The email was routed to [name]. I just happen to notice the e-mail while doing a med refill.

## 2015-10-19 NOTE — Telephone Encounter (Signed)
Tylenol #3 refilled and ready for pick up ambien new, for insomnia. Also ready for pick up please call patient.

## 2015-10-19 NOTE — Telephone Encounter (Signed)
Pt notified rx at front office ready to be pick up

## 2015-10-27 NOTE — Addendum Note (Signed)
Addended by: [name] on: 10/27/2015 10:46 AM   Modules accepted: Orders

## 2015-11-13 ENCOUNTER — Other Ambulatory Visit: Payer: Self-pay | Admitting: Family Medicine

## 2015-11-13 ENCOUNTER — Telehealth: Payer: Self-pay | Admitting: Family Medicine

## 2015-11-13 DIAGNOSIS — G4700 Insomnia, unspecified: Secondary | ICD-10-CM

## 2015-11-13 DIAGNOSIS — G894 Chronic pain syndrome: Secondary | ICD-10-CM

## 2015-11-13 NOTE — Telephone Encounter (Signed)
Patient called requesting medication refill on ALPRAZolam (XANAX) 0.5 MG tablet, please f/up

## 2015-11-15 ENCOUNTER — Other Ambulatory Visit: Payer: Self-pay | Admitting: Family Medicine

## 2015-11-15 DIAGNOSIS — G894 Chronic pain syndrome: Secondary | ICD-10-CM

## 2015-11-15 DIAGNOSIS — G4700 Insomnia, unspecified: Secondary | ICD-10-CM

## 2015-11-15 NOTE — Telephone Encounter (Signed)
Tylenol #3 ready for pick up

## 2015-11-15 NOTE — Telephone Encounter (Signed)
Refilled

## 2015-11-15 NOTE — Telephone Encounter (Signed)
ambien refilled for insomnia

## 2015-11-15 NOTE — Telephone Encounter (Signed)
Xanax refilled.  

## 2015-11-16 NOTE — Telephone Encounter (Signed)
Pt notified Rx at front office ready to be pick up (Xanax, Ambien, Tylenol #3)

## 2015-11-22 ENCOUNTER — Telehealth: Payer: Self-pay | Admitting: Family Medicine

## 2015-11-22 NOTE — Telephone Encounter (Signed)
Patient called stating that she was referred to [facility]. Patient stated that she was frustrated because she has been unable to get an appointment at the office. Patient called and stated that the would like to be referred to [facility]. Patient stated that the office accepts Medicaid. Please follow up.  Fax [phone]

## 2015-11-23 NOTE — Telephone Encounter (Signed)
Sent referral to [facility] for Pain Management Ph. # [phone] Address [address] [place], [place] [address].

## 2015-12-12 ENCOUNTER — Other Ambulatory Visit: Payer: Self-pay | Admitting: Family Medicine

## 2015-12-12 DIAGNOSIS — J0110 Acute frontal sinusitis, unspecified: Secondary | ICD-10-CM

## 2015-12-12 DIAGNOSIS — G894 Chronic pain syndrome: Secondary | ICD-10-CM

## 2015-12-12 DIAGNOSIS — G4700 Insomnia, unspecified: Secondary | ICD-10-CM

## 2015-12-12 NOTE — Telephone Encounter (Signed)
Request for refills for tylenol #3, ambien and xanax  is 3 days early. Please inform patient that refills will be available for pick up on 12/15/15 And she will need to review and sign the controlled substance contract.

## 2015-12-14 MED ORDER — ZOLPIDEM TARTRATE 10 MG PO TABS: 10.0000 mg | ORAL_TABLET | Freq: Every evening | ORAL | Status: DC | PRN

## 2015-12-14 MED ORDER — ACETAMINOPHEN-CODEINE #3 300-30 MG PO TABS: ORAL_TABLET | ORAL | Status: DC

## 2015-12-14 MED ORDER — ALPRAZOLAM 0.5 MG PO TABS: ORAL_TABLET | ORAL | Status: DC

## 2015-12-14 NOTE — Telephone Encounter (Signed)
Please call patient 1. Medications ready for pick up 2. When she comes to pick up she needs to sign controlled substance contract 3. When she comes to pick up she needs to schedule f/u visit with me for her diabetes

## 2015-12-14 NOTE — Addendum Note (Signed)
Addended by: [name] on: 12/14/2015 09:07 AM   Modules accepted: Orders

## 2016-01-05 ENCOUNTER — Other Ambulatory Visit: Payer: Self-pay | Admitting: Family Medicine

## 2016-01-05 DIAGNOSIS — G4700 Insomnia, unspecified: Secondary | ICD-10-CM

## 2016-01-08 NOTE — Telephone Encounter (Signed)
Patient called requesting medication refill on ALPRAZolam (XANAX) 0.5 MG tablet and zolpidem (AMBIEN) 10 MG tablet. Please f/up

## 2016-01-11 NOTE — Telephone Encounter (Signed)
ambien and xanax ready for pick up Patient needs to sign the contract

## 2016-01-12 NOTE — Telephone Encounter (Signed)
Pt notified Rx and Control substance agreement at front office

## 2016-01-31 NOTE — Telephone Encounter (Signed)
Review for medication pickup sign out indicated patient pickup up prescriptions for Tylenol#3, Xanax, ambien placed upfront on 12/14/15. Priscille Heidelberg, RN, BSN

## 2016-04-02 ENCOUNTER — Other Ambulatory Visit: Payer: Self-pay | Admitting: Family Medicine

## 2016-04-02 DIAGNOSIS — G4700 Insomnia, unspecified: Secondary | ICD-10-CM

## 2016-04-02 NOTE — Telephone Encounter (Signed)
Pt was called 9/5

## 2016-04-02 NOTE — Telephone Encounter (Signed)
Xanax refill ready for pick up

## 2016-04-29 ENCOUNTER — Other Ambulatory Visit: Payer: Self-pay | Admitting: Family Medicine

## 2016-04-29 DIAGNOSIS — G4700 Insomnia, unspecified: Secondary | ICD-10-CM

## 2016-05-01 ENCOUNTER — Telehealth: Payer: Self-pay

## 2016-05-01 NOTE — Telephone Encounter (Signed)
Pt was called and informed of script being ready for pick up.

## 2016-05-30 ENCOUNTER — Ambulatory Visit: Payer: Medicaid Other | Attending: Family Medicine | Admitting: Family Medicine

## 2016-05-30 ENCOUNTER — Encounter: Payer: Self-pay | Admitting: Family Medicine

## 2016-05-30 VITALS — BP 142/90 | HR 87 | Temp 98.5°F | Ht <= 58 in | Wt 248.6 lb

## 2016-05-30 DIAGNOSIS — G4700 Insomnia, unspecified: Secondary | ICD-10-CM | POA: Insufficient documentation

## 2016-05-30 DIAGNOSIS — M419 Scoliosis, unspecified: Secondary | ICD-10-CM | POA: Diagnosis not present

## 2016-05-30 DIAGNOSIS — Z794 Long term (current) use of insulin: Secondary | ICD-10-CM | POA: Diagnosis not present

## 2016-05-30 DIAGNOSIS — E1165 Type 2 diabetes mellitus with hyperglycemia: Secondary | ICD-10-CM | POA: Diagnosis not present

## 2016-05-30 DIAGNOSIS — F329 Major depressive disorder, single episode, unspecified: Secondary | ICD-10-CM | POA: Diagnosis not present

## 2016-05-30 DIAGNOSIS — M4120 Other idiopathic scoliosis, site unspecified: Secondary | ICD-10-CM

## 2016-05-30 DIAGNOSIS — G894 Chronic pain syndrome: Secondary | ICD-10-CM | POA: Insufficient documentation

## 2016-05-30 DIAGNOSIS — N289 Disorder of kidney and ureter, unspecified: Secondary | ICD-10-CM | POA: Insufficient documentation

## 2016-05-30 DIAGNOSIS — E785 Hyperlipidemia, unspecified: Secondary | ICD-10-CM | POA: Insufficient documentation

## 2016-05-30 DIAGNOSIS — Z8249 Family history of ischemic heart disease and other diseases of the circulatory system: Secondary | ICD-10-CM | POA: Diagnosis not present

## 2016-05-30 DIAGNOSIS — I10 Essential (primary) hypertension: Secondary | ICD-10-CM | POA: Diagnosis not present

## 2016-05-30 DIAGNOSIS — F5101 Primary insomnia: Secondary | ICD-10-CM

## 2016-05-30 DIAGNOSIS — E119 Type 2 diabetes mellitus without complications: Secondary | ICD-10-CM | POA: Diagnosis present

## 2016-05-30 DIAGNOSIS — IMO0002 Reserved for concepts with insufficient information to code with codable children: Secondary | ICD-10-CM

## 2016-05-30 DIAGNOSIS — E1129 Type 2 diabetes mellitus with other diabetic kidney complication: Secondary | ICD-10-CM

## 2016-05-30 DIAGNOSIS — N926 Irregular menstruation, unspecified: Secondary | ICD-10-CM | POA: Insufficient documentation

## 2016-05-30 LAB — GLUCOSE, POCT (MANUAL RESULT ENTRY): POC Glucose: 321 mg/dl — AB (ref 70–99)

## 2016-05-30 LAB — POCT GLYCOSYLATED HEMOGLOBIN (HGB A1C): Hemoglobin A1C: 10.1

## 2016-05-30 MED ORDER — ALPRAZOLAM 0.5 MG PO TABS: 0.5000 mg | ORAL_TABLET | Freq: Two times a day (BID) | ORAL | 2 refills | Status: DC | PRN

## 2016-05-30 MED ORDER — PREGABALIN 100 MG PO CAPS: 100.0000 mg | ORAL_CAPSULE | Freq: Two times a day (BID) | ORAL | 5 refills | Status: DC

## 2016-05-30 MED ORDER — ZOLPIDEM TARTRATE 10 MG PO TABS: 10.0000 mg | ORAL_TABLET | Freq: Every evening | ORAL | 2 refills | Status: DC | PRN

## 2016-05-30 NOTE — Assessment & Plan Note (Signed)
Slight improvement  Remains uncontrolled Advised diet compliance

## 2016-05-30 NOTE — Assessment & Plan Note (Signed)
Chronic pain  Uncontrolled lyrica re-ordered It sounds like she will need prior authorization

## 2016-05-30 NOTE — Patient Instructions (Addendum)
Briana Williams was seen today for hypertension and diabetes.  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  Chronic pain syndrome -     pregabalin (LYRICA) 100 MG capsule; Take 1 capsule (100 mg total) by mouth 2 (two) times daily.  Insomnia, unspecified type -     ALPRAZolam (XANAX) 0.5 MG tablet; Take 1 tablet (0.5 mg total) by mouth 2 (two) times daily as needed for anxiety.  Primary insomnia -     zolpidem (AMBIEN) 10 MG tablet; Take 1 tablet (10 mg total) by mouth at bedtime as needed. for sleep   Drop off lyrica for prior authorization  F/u in 2 weeks for pap smear   Dr. Adrian Blackwater

## 2016-05-30 NOTE — Progress Notes (Signed)
LOGO@  Subjective:  Patient ID: Briana Williams, female    DOB: 27-Jul-1981  Age: 35 y.o. MRN: [id number]  CC: Hypertension and Diabetes   HPI Briana Williams has scoliosis, HTN, insomnia, DM2 uncontrolled, depression she  presents for   1. CHRONIC DIABETES  Disease Monitoring  Blood Sugar Ranges: 300s lately   Polyuria: no   Visual problems: no   Medication Compliance: yes    Medication Side Effects  Hypoglycemia: no   Preventitive Health Care  Eye Exam: due   Foot Exam: done today   Just ate [employer]   2. Chronic pain: neck, L shoulder, lower back. Has scoliosis and has had over 40 surgeries. In between pain clinics. Was discharged from pain management in [place], [place] due to her pill count being off. Has been referred by her orthopaedic to a pain management center in [place]. Previously on dilaudid, zanaflex and lyrica. Pain is chronic and daily. Pain interferes with sleep. Is current out of lyrica.   3. Menstrual problems: skipped period for two months. Period is light. Period is painful.  Has hx of cyst on ovaries. Hirsutism. Has not had pap or pelvic in many years.   Social History  Substance Use Topics  . Smoking status: Never Smoker  . Smokeless tobacco: Not on file  . Alcohol use No    Past Medical History:  Diagnosis Date  . Hyperlipidemia   . Hypertension associated with diabetes ([place])   . Renal disorder   . Scoliosis   . Type 2 diabetes mellitus treated with insulin [facility])     Past Surgical History:  Procedure Laterality Date  . BACK SURGERY    . CERVICAL FUSION    . frontal closed sutures to forehead    . Harrington Rod placement    . HIP SURGERY     staph infection  . LUMBAR FUSION      Family History  Problem Relation Age of Onset  . Cancer Maternal Grandmother     mandibular cancer   . Diabetes Mother   . Diabetes Maternal Aunt   . Heart failure Mother   . CAD Mother     triple CABG  . Stroke Father     Social History   Substance Use Topics  . Smoking status: Never Smoker  . Smokeless tobacco: Not on file  . Alcohol use No    ROS Review of Systems  Constitutional: Positive for fever. Negative for chills.  HENT: Negative for congestion and sinus pressure.   Eyes: Negative for visual disturbance.  Respiratory: Negative for cough and shortness of breath.   Cardiovascular: Negative for chest pain.  Gastrointestinal: Negative for abdominal pain and blood in stool.  Genitourinary: Positive for menstrual problem.  Musculoskeletal: Positive for arthralgias, back pain and gait problem.  Skin: Negative for rash.  Allergic/Immunologic: Negative for immunocompromised state.  Neurological: Positive for headaches.  Hematological: Negative for adenopathy. Does not bruise/bleed easily.  Psychiatric/Behavioral: Negative for dysphoric mood and suicidal ideas.    Objective:   Today's Vitals: BP (!) 142/90 (BP Location: Right Arm, Patient Position: Sitting, Cuff Size: Large)   Pulse 87   Temp 98.5 F (36.9 C) (Oral)   Ht 4' 6"  (1.372 m)   Wt 248 lb 9.6 oz (112.8 kg)   SpO2 99%   BMI 59.94 kg/m  BP Readings from Last 3 Encounters:  05/30/16 (!) 142/90  09/21/15 (!) 141/87  08/13/15 138/83   Physical Exam  Constitutional: She is oriented to person,  place, and time. She appears well-developed and well-nourished. No distress.  HENT:  Head: Normocephalic and atraumatic.  Nose: Mucosal edema present. Right sinus exhibits maxillary sinus tenderness and frontal sinus tenderness. Left sinus exhibits no maxillary sinus tenderness and no frontal sinus tenderness.  Cardiovascular: Normal rate, regular rhythm, normal heart sounds and intact distal pulses.   Pulmonary/Chest: Effort normal and breath sounds normal.  Musculoskeletal: She exhibits no edema.  Neurological: She is alert and oriented to person, place, and time.  Skin: Skin is warm and dry. No rash noted.  Psychiatric: She has a normal mood and affect.    Lab Results  Component Value Date   HGBA1C 10.8 09/21/2015   Lab Results  Component Value Date   HGBA1C 10.1 05/30/2016    CBG 321  Assessment & Plan:   Problem List Items Addressed This Visit      High   SCOLIOSIS (Chronic)   Relevant Medications   pregabalin (LYRICA) 100 MG capsule   INSOMNIA (Chronic)   Relevant Medications   ALPRAZolam (XANAX) 0.5 MG tablet   zolpidem (AMBIEN) 10 MG tablet   Essential hypertension, benign (Chronic)   Diabetes type 2, uncontrolled (HCC) - Primary (Chronic)   Relevant Orders   POCT glucose (manual entry) (Completed)   POCT glycosylated hemoglobin (Hb A1C) (Completed)   Chronic pain syndrome (Chronic)   Relevant Medications   pregabalin (LYRICA) 100 MG capsule     Unprioritized   Irregular menstrual cycle   Relevant Orders   FSH/LH   US Pelvis Complete   US Transvaginal Non-OB    Other Visit Diagnoses   None.     Outpatient Encounter Prescriptions as of 05/30/2016  Medication Sig  . ACCU-CHEK SOFTCLIX LANCETS lancets 1 each by Other route 3 (three) times daily. ICD 10 E11.9  . albuterol (PROVENTIL HFA;VENTOLIN HFA) 108 (90 Base) MCG/ACT inhaler Inhale 1 puff into the lungs every 6 (six) hours as needed for wheezing or shortness of breath.  . ALPRAZolam (XANAX) 0.5 MG tablet TAKE 1-2 TABLETS BY MOUTH AT BEDTIME AS NEEDED FOR ANXIETY  . Blood Glucose Monitoring Suppl (ACCU-CHEK AVIVA PLUS) w/Device KIT 1 Device by Does not apply route 3 (three) times daily after meals. ICD 10 E11.9  . glucose blood (ACCU-CHEK AVIVA PLUS) test strip 1 each by Other route 3 (three) times daily. ICD 10 E11.9  . insulin aspart (NOVOLOG) 100 UNIT/ML injection 12-20 per sliding scale  . insulin glargine (LANTUS) 100 UNIT/ML injection Inject 0.2 mLs (20 Units total) into the skin at bedtime.  Elmore Guise Devices (ACCU-CHEK SOFTCLIX) lancets 1 each by Other route 3 (three) times daily. ICD 10 E11.9  . lisinopril (PRINIVIL,ZESTRIL) 30 MG tablet TAKE 1  TABLET BY MOUTH DAILY  . metFORMIN (GLUCOPHAGE-XR) 500 MG 24 hr tablet Take 1 tablet (500 mg total) by mouth 2 (two) times daily.  . pregabalin (LYRICA) 100 MG capsule Take 1 capsule (100 mg total) by mouth 2 (two) times daily.  Marland Kitchen tiZANidine (ZANAFLEX) 4 MG tablet Take 1 tablet (4 mg total) by mouth every 8 (eight) hours as needed for muscle spasms.  Marland Kitchen zolpidem (AMBIEN) 10 MG tablet TAKE 1 TABLET BY MOUTH AT BEDTIME AS NEEDED FOR SLEEP  . acetaminophen-codeine (TYLENOL #3) 300-30 MG tablet TAKE 1 TABLET BY MOUTH EVERY 8 HOURS AS NEEDED FOR MODERATE PAIN (Patient not taking: Reported on 05/30/2016)  . amoxicillin (AMOXIL) 500 MG capsule Take 1 capsule (500 mg total) by mouth 3 (three) times daily. (Patient not taking: Reported  on 05/30/2016)   No facility-administered encounter medications on file as of 05/30/2016.     Follow-up: No Follow-up on file.    [name] MD

## 2016-05-30 NOTE — Assessment & Plan Note (Addendum)
Irregular menses With hirsutism, report of cyst on ovaries in past Likely PCOS  Checking hormone levels Pelvic and TVUS ordered Pap at f/u

## 2016-05-30 NOTE — Assessment & Plan Note (Signed)
HTN today If elevated at recheck Increase lisinopril to 40 mg daily

## 2016-05-30 NOTE — Progress Notes (Signed)
Pt is here today for a follow up on diabetes and HTN.  Pt needs refill on all medications.

## 2016-05-31 ENCOUNTER — Other Ambulatory Visit: Payer: Self-pay | Admitting: Family Medicine

## 2016-05-31 DIAGNOSIS — E1165 Type 2 diabetes mellitus with hyperglycemia: Principal | ICD-10-CM

## 2016-05-31 DIAGNOSIS — E1129 Type 2 diabetes mellitus with other diabetic kidney complication: Secondary | ICD-10-CM

## 2016-05-31 DIAGNOSIS — Z794 Long term (current) use of insulin: Principal | ICD-10-CM

## 2016-05-31 DIAGNOSIS — IMO0002 Reserved for concepts with insufficient information to code with codable children: Secondary | ICD-10-CM

## 2016-05-31 LAB — FSH/LH
FSH: 6.4 m[IU]/mL
LH: 4.7 m[IU]/mL

## 2016-06-05 ENCOUNTER — Telehealth: Payer: Self-pay | Admitting: Family Medicine

## 2016-06-05 NOTE — Telephone Encounter (Signed)
[name] from pre service center called and stated that there are two orders for an ultrasound, however. Only one precert has been made.   She said she has the one for the pelvic but not transvaginal.

## 2016-06-05 NOTE — Telephone Encounter (Signed)
[name] called to inform us that their is a pre certification needed for Ms. Briana Williams U/S The pelvis has been pre certified but the transvaginal US has not.

## 2016-06-05 NOTE — Telephone Encounter (Signed)
Call was placed to [facility] a VM was left for her to return phone call.

## 2016-06-06 ENCOUNTER — Ambulatory Visit (HOSPITAL_COMMUNITY): Payer: Medicaid Other

## 2016-06-06 NOTE — Telephone Encounter (Signed)
Please call back  To [name] and ask what needs to be done for pre certification.  Patient has medicaid so perhaps [employer] needs to be called, usually they send a fax if approval is needed.

## 2016-06-10 ENCOUNTER — Ambulatory Visit (HOSPITAL_COMMUNITY): Payer: Medicaid Other

## 2016-06-12 ENCOUNTER — Ambulatory Visit (HOSPITAL_COMMUNITY)
Admission: RE | Admit: 2016-06-12 | Discharge: 2016-06-12 | Disposition: A | Discharge status: Home / Self Care | Payer: Medicaid Other | Source: Ambulatory Visit | Attending: Family Medicine | Admitting: Family Medicine

## 2016-06-12 ENCOUNTER — Other Ambulatory Visit: Payer: Self-pay | Admitting: Family Medicine

## 2016-06-12 DIAGNOSIS — N926 Irregular menstruation, unspecified: Secondary | ICD-10-CM | POA: Insufficient documentation

## 2016-06-24 ENCOUNTER — Other Ambulatory Visit: Payer: Self-pay | Admitting: Internal Medicine

## 2016-06-24 DIAGNOSIS — G4700 Insomnia, unspecified: Secondary | ICD-10-CM

## 2016-06-29 ENCOUNTER — Other Ambulatory Visit: Payer: Self-pay | Admitting: Family Medicine

## 2016-07-02 ENCOUNTER — Other Ambulatory Visit: Payer: Self-pay | Admitting: Internal Medicine

## 2016-07-02 DIAGNOSIS — G4700 Insomnia, unspecified: Secondary | ICD-10-CM

## 2016-07-03 NOTE — Telephone Encounter (Signed)
Xanax refilled with 2 refills on 05/30/16 Next refill due 08/30/2016

## 2016-07-15 ENCOUNTER — Ambulatory Visit: Payer: Medicaid Other | Attending: Family Medicine | Admitting: Family Medicine

## 2016-07-15 ENCOUNTER — Other Ambulatory Visit (HOSPITAL_COMMUNITY)
Admission: RE | Admit: 2016-07-15 | Discharge: 2016-07-15 | Disposition: A | Discharge status: Home / Self Care | Payer: Medicaid Other | Source: Ambulatory Visit | Attending: Family Medicine | Admitting: Family Medicine

## 2016-07-15 VITALS — BP 118/79 | HR 88 | Temp 98.2°F | Ht <= 58 in | Wt 241.4 lb

## 2016-07-15 DIAGNOSIS — N926 Irregular menstruation, unspecified: Secondary | ICD-10-CM | POA: Diagnosis present

## 2016-07-15 DIAGNOSIS — G8929 Other chronic pain: Secondary | ICD-10-CM | POA: Diagnosis not present

## 2016-07-15 DIAGNOSIS — E1165 Type 2 diabetes mellitus with hyperglycemia: Secondary | ICD-10-CM | POA: Insufficient documentation

## 2016-07-15 DIAGNOSIS — Z113 Encounter for screening for infections with a predominantly sexual mode of transmission: Secondary | ICD-10-CM | POA: Insufficient documentation

## 2016-07-15 DIAGNOSIS — Z794 Long term (current) use of insulin: Secondary | ICD-10-CM | POA: Insufficient documentation

## 2016-07-15 DIAGNOSIS — I10 Essential (primary) hypertension: Secondary | ICD-10-CM | POA: Diagnosis not present

## 2016-07-15 DIAGNOSIS — Z124 Encounter for screening for malignant neoplasm of cervix: Secondary | ICD-10-CM | POA: Diagnosis not present

## 2016-07-15 DIAGNOSIS — E1129 Type 2 diabetes mellitus with other diabetic kidney complication: Secondary | ICD-10-CM | POA: Diagnosis not present

## 2016-07-15 DIAGNOSIS — Z01419 Encounter for gynecological examination (general) (routine) without abnormal findings: Secondary | ICD-10-CM | POA: Diagnosis present

## 2016-07-15 DIAGNOSIS — IMO0002 Reserved for concepts with insufficient information to code with codable children: Secondary | ICD-10-CM

## 2016-07-15 DIAGNOSIS — E669 Obesity, unspecified: Secondary | ICD-10-CM | POA: Diagnosis not present

## 2016-07-15 DIAGNOSIS — Z1151 Encounter for screening for human papillomavirus (HPV): Secondary | ICD-10-CM | POA: Insufficient documentation

## 2016-07-15 LAB — GLUCOSE, POCT (MANUAL RESULT ENTRY): POC Glucose: 281 mg/dl — AB (ref 70–99)

## 2016-07-15 NOTE — Progress Notes (Signed)
Subjective:  Patient ID: Briana Williams, female    DOB: 1980/09/01  Age: 35 y.o. MRN: [id number]  CC: Gynecologic Exam   HPI Briana Williams has scoliosis, chronic pain, diabetes, HTN she  presents for    1. Menstrual problems: skipped period for two months. Period is light. Period is painful.  Has hx of cyst on ovaries. Hirsutism. Had recent pelvic and transvaginal ultrasound. Her uterus is normal. Ovaries were not visualized. Having screening pap smear today. She has a CT abdomen and pelvis in 01/12/2011 that revealed:  "Stable appearance of prominent ovaries.  Given the patient'sobese habitus, polycystic ovary syndrome cannot be excluded. Recommend clinical correlation and biochemical testing ifappropriate"   Social History  Substance Use Topics  . Smoking status: Never Smoker  . Smokeless tobacco: Not on file  . Alcohol use No   Outpatient Medications Prior to Visit  Medication Sig Dispense Refill  . ACCU-CHEK SOFTCLIX LANCETS lancets 1 each by Other route 3 (three) times daily. ICD 10 E11.9 100 each 12  . albuterol (PROVENTIL HFA;VENTOLIN HFA) 108 (90 Base) MCG/ACT inhaler Inhale 1 puff into the lungs every 6 (six) hours as needed for wheezing or shortness of breath. 8 g 3  . ALPRAZolam (XANAX) 0.5 MG tablet Take 1 tablet (0.5 mg total) by mouth 2 (two) times daily as needed for anxiety. 60 tablet 2  . Blood Glucose Monitoring Suppl (ACCU-CHEK AVIVA PLUS) w/Device KIT 1 Device by Does not apply route 3 (three) times daily after meals. ICD 10 E11.9 1 kit 0  . glucose blood (ACCU-CHEK AVIVA PLUS) test strip 1 each by Other route 3 (three) times daily. ICD 10 E11.9 100 each 12  . insulin aspart (NOVOLOG) 100 UNIT/ML injection 12-20 per sliding scale 1 vial 5  . insulin glargine (LANTUS) 100 UNIT/ML injection Inject 0.2 mLs (20 Units total) into the skin at bedtime. 10 mL 3  . Lancet Devices (ACCU-CHEK SOFTCLIX) lancets 1 each by Other route 3 (three) times daily. ICD 10  E11.9 1 each 0  . lisinopril (PRINIVIL,ZESTRIL) 30 MG tablet TAKE 1 TABLET BY MOUTH DAILY 30 tablet 0  . metFORMIN (GLUCOPHAGE-XR) 500 MG 24 hr tablet Take 1 tablet (500 mg total) by mouth 2 (two) times daily. 60 tablet 2  . oxyCODONE (OXYCONTIN) 10 mg 12 hr tablet Take 10 mg by mouth 3 (three) times daily.    Marland Kitchen tiZANidine (ZANAFLEX) 4 MG tablet Take 1 tablet (4 mg total) by mouth every 8 (eight) hours as needed for muscle spasms. 60 tablet 3  . zolpidem (AMBIEN) 10 MG tablet Take 1 tablet (10 mg total) by mouth at bedtime as needed. for sleep 30 tablet 2  . pregabalin (LYRICA) 100 MG capsule Take 1 capsule (100 mg total) by mouth 2 (two) times daily. (Patient not taking: Reported on 07/15/2016) 60 capsule 5   No facility-administered medications prior to visit.     ROS Review of Systems  Constitutional: Negative for chills and fever.  Eyes: Negative for visual disturbance.  Respiratory: Negative for shortness of breath.   Cardiovascular: Negative for chest pain.  Gastrointestinal: Negative for abdominal pain and blood in stool.  Genitourinary: Positive for menstrual problem.  Musculoskeletal: Negative for arthralgias and back pain.  Skin: Negative for rash.  Allergic/Immunologic: Negative for immunocompromised state.  Hematological: Negative for adenopathy. Does not bruise/bleed easily.  Psychiatric/Behavioral: Negative for dysphoric mood and suicidal ideas.    Objective:  BP 118/79 (BP Location: Left Arm, Patient Position: Sitting, Cuff  Size: Large)   Pulse 88   Temp 98.2 F (36.8 C) (Oral)   Ht _0  (1.372 m)   Wt 241 lb 6.4 oz (109.5 kg)   SpO2 97%   BMI 58.20 kg/m   BP/Weight 07/15/2016 05/30/2016 4/72/0721  Systolic BP 828 833 744  Diastolic BP 79 90 87  Wt. (Lbs) 241.4 248.6 234  BMI 58.2 59.94 56.39    Physical Exam  Constitutional: She is oriented to person, place, and time. She appears well-developed and well-nourished. No distress.  HENT:  Head: Normocephalic  and atraumatic.  Cardiovascular: Normal rate, regular rhythm, normal heart sounds and intact distal pulses.   Pulmonary/Chest: Effort normal and breath sounds normal.  Genitourinary: Vagina normal.    Pelvic exam was performed with patient prone. There is no rash, tenderness or lesion on the right labia. There is no rash, tenderness or lesion on the left labia. Uterus is tender. Cervix exhibits no motion tenderness, no discharge and no friability. Right adnexum displays tenderness. Right adnexum displays no mass and no fullness. Left adnexum displays tenderness. Left adnexum displays no mass and no fullness.  Musculoskeletal: She exhibits no edema.  Lymphadenopathy:       Right: No inguinal adenopathy present.       Left: No inguinal adenopathy present.  Neurological: She is alert and oriented to person, place, and time.  Skin: Skin is warm and dry. No rash noted.  Psychiatric: She has a normal mood and affect.   Lab Results  Component Value Date   HGBA1C 10.1 05/30/2016   CBG 281 Assessment & Plan:   Briana Williams was seen today for gynecologic exam.  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   Follow-up: Return in about 6 weeks (around 08/26/2016) for diabetes .   [name] MD

## 2016-07-15 NOTE — Patient Instructions (Addendum)
Briana Williams was seen today for gynecologic exam.  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 Adrian Blackwater

## 2016-07-16 ENCOUNTER — Encounter: Payer: Self-pay | Admitting: Family Medicine

## 2016-07-16 NOTE — Addendum Note (Signed)
Addended by: [name] on: 07/16/2016 05:28 PM   Modules accepted: Orders

## 2016-07-16 NOTE — Assessment & Plan Note (Addendum)
Irregular menses, diabetes, hirsutism and obesity consistent with PCOS  Pap done today Pelvic pain on exam No discharge or lesions   Plan: Gyn referral  Continue to work on diabetes with goal of improving glycemic control

## 2016-07-18 LAB — CYTOLOGY - PAP
Diagnosis: NEGATIVE
HPV (WINDOPATH): NOT DETECTED

## 2016-07-19 LAB — CERVICOVAGINAL ANCILLARY ONLY: WET PREP (BD AFFIRM): POSITIVE — AB

## 2016-07-22 ENCOUNTER — Other Ambulatory Visit: Payer: Self-pay | Admitting: Family Medicine

## 2016-07-22 DIAGNOSIS — F5101 Primary insomnia: Secondary | ICD-10-CM

## 2016-07-25 ENCOUNTER — Encounter: Payer: Self-pay | Admitting: Family Medicine

## 2016-07-25 DIAGNOSIS — B3731 Acute candidiasis of vulva and vagina: Secondary | ICD-10-CM

## 2016-07-25 DIAGNOSIS — B373 Candidiasis of vulva and vagina: Secondary | ICD-10-CM

## 2016-07-25 MED ORDER — FLUCONAZOLE 150 MG PO TABS: 150.0000 mg | ORAL_TABLET | ORAL | 0 refills | Status: AC

## 2016-07-25 NOTE — Telephone Encounter (Signed)
Patient concern

## 2016-08-07 ENCOUNTER — Encounter (HOSPITAL_COMMUNITY): Payer: Self-pay | Admitting: Emergency Medicine

## 2016-08-07 ENCOUNTER — Emergency Department (HOSPITAL_COMMUNITY)
Admission: EM | Admit: 2016-08-07 | Discharge: 2016-08-07 | Disposition: A | Discharge status: Home / Self Care | Payer: Medicaid Other | Attending: Dermatology | Admitting: Dermatology

## 2016-08-07 DIAGNOSIS — R51 Headache: Secondary | ICD-10-CM | POA: Diagnosis present

## 2016-08-07 DIAGNOSIS — Z5321 Procedure and treatment not carried out due to patient leaving prior to being seen by health care provider: Secondary | ICD-10-CM | POA: Insufficient documentation

## 2016-08-07 NOTE — ED Notes (Signed)
Pt states "I can't wait this long. I'm going home."

## 2016-08-07 NOTE — ED Triage Notes (Signed)
Pt. Stated, I fell trying to feed the neighbors' dog . I've not sure if I was knocked unconcussion or not. I remember tripping over something, and the next thing my son saying, "are you alright".  I've had multiple neck surgeries.  Pt. Had a laceration to bridge of nose and abrasion to her forehead.

## 2016-08-16 ENCOUNTER — Encounter: Payer: Self-pay | Admitting: Obstetrics and Gynecology

## 2016-08-22 ENCOUNTER — Ambulatory Visit: Payer: Medicaid Other | Attending: Family Medicine | Admitting: Family Medicine

## 2016-08-22 ENCOUNTER — Encounter: Payer: Self-pay | Admitting: Family Medicine

## 2016-08-22 VITALS — BP 137/84 | HR 79 | Temp 98.4°F | Ht <= 58 in | Wt 240.8 lb

## 2016-08-22 DIAGNOSIS — L299 Pruritus, unspecified: Secondary | ICD-10-CM | POA: Insufficient documentation

## 2016-08-22 DIAGNOSIS — E1165 Type 2 diabetes mellitus with hyperglycemia: Secondary | ICD-10-CM

## 2016-08-22 DIAGNOSIS — E785 Hyperlipidemia, unspecified: Secondary | ICD-10-CM | POA: Insufficient documentation

## 2016-08-22 DIAGNOSIS — Z981 Arthrodesis status: Secondary | ICD-10-CM | POA: Insufficient documentation

## 2016-08-22 DIAGNOSIS — F5101 Primary insomnia: Secondary | ICD-10-CM

## 2016-08-22 DIAGNOSIS — Z8249 Family history of ischemic heart disease and other diseases of the circulatory system: Secondary | ICD-10-CM | POA: Insufficient documentation

## 2016-08-22 DIAGNOSIS — I10 Essential (primary) hypertension: Secondary | ICD-10-CM | POA: Insufficient documentation

## 2016-08-22 DIAGNOSIS — Z833 Family history of diabetes mellitus: Secondary | ICD-10-CM | POA: Diagnosis not present

## 2016-08-22 DIAGNOSIS — M419 Scoliosis, unspecified: Secondary | ICD-10-CM | POA: Diagnosis not present

## 2016-08-22 DIAGNOSIS — G894 Chronic pain syndrome: Secondary | ICD-10-CM

## 2016-08-22 DIAGNOSIS — E119 Type 2 diabetes mellitus without complications: Secondary | ICD-10-CM | POA: Diagnosis present

## 2016-08-22 DIAGNOSIS — G4700 Insomnia, unspecified: Secondary | ICD-10-CM | POA: Insufficient documentation

## 2016-08-22 DIAGNOSIS — Z794 Long term (current) use of insulin: Secondary | ICD-10-CM | POA: Diagnosis not present

## 2016-08-22 DIAGNOSIS — E1129 Type 2 diabetes mellitus with other diabetic kidney complication: Secondary | ICD-10-CM | POA: Diagnosis not present

## 2016-08-22 DIAGNOSIS — IMO0002 Reserved for concepts with insufficient information to code with codable children: Secondary | ICD-10-CM

## 2016-08-22 LAB — GLUCOSE, POCT (MANUAL RESULT ENTRY): POC Glucose: 177 mg/dl — AB (ref 70–99)

## 2016-08-22 LAB — POCT GLYCOSYLATED HEMOGLOBIN (HGB A1C): Hemoglobin A1C: 9.8

## 2016-08-22 MED ORDER — INSULIN GLARGINE 100 UNIT/ML ~~LOC~~ SOLN: 30.0000 [IU] | Freq: Every day | SUBCUTANEOUS | 3 refills | Status: DC

## 2016-08-22 MED ORDER — TIZANIDINE HCL 4 MG PO TABS: 4.0000 mg | ORAL_TABLET | Freq: Three times a day (TID) | ORAL | 3 refills | Status: DC | PRN

## 2016-08-22 MED ORDER — ALPRAZOLAM 0.5 MG PO TABS: 0.5000 mg | ORAL_TABLET | Freq: Two times a day (BID) | ORAL | 2 refills | Status: DC | PRN

## 2016-08-22 MED ORDER — AMITRIPTYLINE HCL 50 MG PO TABS: 50.0000 mg | ORAL_TABLET | Freq: Every day | ORAL | 2 refills | Status: DC

## 2016-08-22 NOTE — Progress Notes (Signed)
LOGO@  Subjective:  Patient ID: Briana Williams, female    DOB: [birth year]  Age: 36 y.o. MRN: [id number]  CC: Diabetes   HPI Briana Williams has scoliosis, HTN, insomnia, DM2 uncontrolled, depression she presents for   1. CHRONIC DIABETES  Disease Monitoring  Blood Sugar Ranges: 230 highest   Polyuria: no   Visual problems: no   Medication Compliance: yes, taking 20 U of lantus, 27 U of novolog with meals based on sliding scale (total 81 U in 24 hrs)  Medication Side Effects  Hypoglycemia: no   Preventitive Health Care  Eye Exam: due   Foot Exam: done today     2. Insomnia: taking ambien. Not taking lyrica as it was not approved by mediciad. Has pain when she tries to sleep.   3. Frequent stools: going on for many years. She cannot tolerate milk, yogurt, cheese. No blood in stool. No fever, chills or weight loss. No stomach pains. She takes metformin.   4. Rash: pruritic papules on body. itching on back and legs mostly. No close contacts with rash.  Going on for a few months. No fever, chills or joint pains.   Social History  Substance Use Topics  . Smoking status: Never Smoker  . Smokeless tobacco: Never Used  . Alcohol use No    Past Medical History:  Diagnosis Date  . Hyperlipidemia   . Hypertension associated with diabetes ([place])   . Renal disorder   . Scoliosis   . Type 2 diabetes mellitus treated with insulin [facility])     Past Surgical History:  Procedure Laterality Date  . BACK SURGERY    . CERVICAL FUSION    . frontal closed sutures to forehead    . Harrington Rod placement    . HIP SURGERY     staph infection  . LUMBAR FUSION      Family History  Problem Relation Age of Onset  . Cancer Maternal Grandmother     mandibular cancer   . Diabetes Mother   . Heart failure Mother   . CAD Mother     triple CABG  . Diabetes Maternal Aunt   . Stroke Father     Social History  Substance Use Topics  . Smoking status: Never Smoker  . Smokeless  tobacco: Never Used  . Alcohol use No    ROS Review of Systems  Constitutional: Negative for chills and fever.  HENT: Negative for congestion and sinus pressure.   Eyes: Negative for visual disturbance.  Respiratory: Negative for cough and shortness of breath.   Cardiovascular: Negative for chest pain.  Gastrointestinal: Negative for abdominal pain and blood in stool.  Genitourinary: Positive for menstrual problem.  Musculoskeletal: Positive for arthralgias, back pain and gait problem.  Skin: Positive for rash.  Allergic/Immunologic: Negative for immunocompromised state.  Neurological: Positive for headaches.  Hematological: Negative for adenopathy. Does not bruise/bleed easily.  Psychiatric/Behavioral: Positive for sleep disturbance. Negative for dysphoric mood and suicidal ideas.    Objective:   Today's Vitals: BP 137/84 (BP Location: Left Arm, Patient Position: Sitting, Cuff Size: Large)   Pulse 79   Temp 98.4 F (36.9 C) (Oral)   Ht '4\' 6"'$  (1.372 m)   Wt 240 lb 12.8 oz (109.2 kg)   LMP 07/27/2016   SpO2 99%   BMI 58.06 kg/m  BP Readings from Last 3 Encounters:  08/22/16 137/84  08/07/16 160/92  07/15/16 118/79   Physical Exam  Constitutional: She is oriented to person, place,  and time. She appears well-developed and well-nourished. No distress.  Obese   HENT:  Head: Normocephalic and atraumatic.  Cardiovascular: Normal rate, regular rhythm, normal heart sounds and intact distal pulses.   Pulmonary/Chest: Effort normal and breath sounds normal.  Musculoskeletal: She exhibits no edema.  Neurological: She is alert and oriented to person, place, and time.  Skin: Skin is warm and dry. No rash noted.     Psychiatric: She has a normal mood and affect.   Lab Results  Component Value Date   HGBA1C 10.1 05/30/2016   Lab Results  Component Value Date   HGBA1C 9.8 08/22/2016    CBG 177  Depression screen [facility] 2/9 08/22/2016 07/15/2016 05/30/2016  Decreased Interest  _0 Down, Depressed, Hopeless _1 PHQ - 2 Score _2 Altered sleeping _3 Tired, decreased energy _4 Change in appetite _5 Feeling bad or failure about yourself  _6 Trouble concentrating _7 Moving slowly or fidgety/restless 1 0 0  Suicidal thoughts 1 0 0  PHQ-9 Score _8 GAD 7 : Generalized Anxiety Score 08/22/2016 07/15/2016 05/30/2016 09/21/2015  Nervous, Anxious, on Edge _9 0  Control/stop worrying _10 0  Worry too much - different things _11 0  Trouble relaxing _12 Restless _13 Easily annoyed or irritable _14 0  Afraid - awful might happen 0 0 1 0  Total GAD 7 Score _15 Assessment & Plan:   Problem List Items Addressed This Visit      High   INSOMNIA (Chronic)   Relevant Medications   ALPRAZolam (XANAX) 0.5 MG tablet   Diabetes type 2, uncontrolled (HCC) - Primary (Chronic)   Relevant Medications   insulin glargine (LANTUS) 100 UNIT/ML injection   Other Relevant Orders   POCT glucose (manual entry) (Completed)   POCT glycosylated hemoglobin (Hb A1C) (Completed)   Chronic pain syndrome (Chronic)   Relevant Medications   amitriptyline (ELAVIL) 50 MG tablet   tiZANidine (ZANAFLEX) 4 MG tablet    Other Visit Diagnoses    Pruritus       Relevant Orders   Ambulatory referral to Dermatology      Outpatient Encounter Prescriptions as of 08/22/2016  Medication Sig  . ACCU-CHEK SOFTCLIX LANCETS lancets 1 each by Other route 3 (three) times daily. ICD 10 E11.9  . albuterol (PROVENTIL HFA;VENTOLIN HFA) 108 (90 Base) MCG/ACT inhaler Inhale 1 puff into the lungs every 6 (six) hours as needed for wheezing or shortness of breath.  . ALPRAZolam (XANAX) 0.5 MG tablet Take 1 tablet (0.5 mg total) by mouth 2 (two) times daily as needed for anxiety.  . Blood Glucose Monitoring Suppl (ACCU-CHEK AVIVA PLUS) w/Device KIT 1 Device by Does not apply route 3 (three) times daily after meals. ICD 10 E11.9  . glucose blood  (ACCU-CHEK AVIVA PLUS) test strip 1 each by Other route 3 (three) times daily. ICD 10 E11.9  . insulin aspart (NOVOLOG) 100 UNIT/ML injection 12-20 per sliding scale  . insulin glargine (LANTUS) 100 UNIT/ML injection Inject 0.2 mLs (20 Units total) into the skin at bedtime.  Elmore Guise Devices (ACCU-CHEK SOFTCLIX) lancets 1 each by Other route 3 (three) times daily. ICD 10 E11.9  . lisinopril (PRINIVIL,ZESTRIL) 30 MG tablet TAKE 1 TABLET BY MOUTH  DAILY  . metFORMIN (GLUCOPHAGE-XR) 500 MG 24 hr tablet Take 1 tablet (500 mg total) by mouth 2 (two) times daily.  Marland Kitchen oxyCODONE (OXYCONTIN) 10 mg 12 hr tablet Take 10 mg by mouth 3 (three) times daily.  Marland Kitchen tiZANidine (ZANAFLEX) 4 MG tablet Take 1 tablet (4 mg total) by mouth every 8 (eight) hours as needed for muscle spasms.  Marland Kitchen zolpidem (AMBIEN) 10 MG tablet Take 1 tablet (10 mg total) by mouth at bedtime as needed. for sleep  . pregabalin (LYRICA) 100 MG capsule Take 1 capsule (100 mg total) by mouth 2 (two) times daily. (Patient not taking: Reported on 07/15/2016)   No facility-administered encounter medications on file as of 08/22/2016.     Follow-up: Return in about 2 months (around 10/20/2016) for diabetes .    [name] MD

## 2016-08-22 NOTE — Patient Instructions (Addendum)
Estelene was seen today for diabetes.  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  Chronic pain syndrome -     amitriptyline (ELAVIL) 50 MG tablet; Take 1 tablet (50 mg total) by mouth at bedtime. -     tiZANidine (ZANAFLEX) 4 MG tablet; Take 1 tablet (4 mg total) by mouth every 8 (eight) hours as needed for muscle spasms.  Pruritus -     Ambulatory referral to Dermatology  Insomnia, unspecified type -     ALPRAZolam (XANAX) 0.5 MG tablet; Take 1 tablet (0.5 mg total) by mouth 2 (two) times daily as needed for anxiety.    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 Exclude foods that increase flatulence (eg, beans, onions, celery, carrots, raisins, bananas, apricots, prunes, Brussels sprouts, wheat germ, pretzels, and bagels), alcohol, and caffeine.  Have symptoms improved?   If yes, continue to excluded above  If no,  2. Exclude lactose   Have symptoms improved?  If yes, continue to exclude 1 and 2.  If no,  3. Exclude FODMAP Characteristics and sources of common FODMAPs  F Fermentable    O Oligosaccharides Fructans, galacto-oligosaccharides Wheat, barley, rye, onion, leek, white part of spring onion, garlic, shallots, artichokes, beetroot, fennel, peas, chicory, pistachio, cashews, legumes, lentils, and chickpeas  D Disaccharides Lactose Milk, custard, ice cream, and yogurt  M Monosaccharides "Free fructose" (fructose in excess of glucose) Apples, pears, mangoes, cherries, watermelon, asparagus, sugar snap peas, honey, high-fructose corn syrup  A And  P Polyols Sorbitol,  mannitol, maltitol, and xylitol Apples, pears, apricots, cherries, nectarines, peaches, plums, watermelon, mushrooms, cauliflower, artificially sweetened chewing gum and confectionery   Be sure to drink plenty of fluids to keep up with water loses from diarrhea  F/u in 2 months for HTN and diabetes   Dr. Adrian Blackwater

## 2016-08-23 DIAGNOSIS — L299 Pruritus, unspecified: Secondary | ICD-10-CM | POA: Insufficient documentation

## 2016-08-23 NOTE — Assessment & Plan Note (Addendum)
Improving Increase lantus to 30 U  Having watery stools after meals, trial off metformin as metformin induced GI upset is most likely

## 2016-08-23 NOTE — Assessment & Plan Note (Signed)
Papules and xerotic skin on exam  Advised moisturizer Derm referral

## 2016-08-23 NOTE — Assessment & Plan Note (Signed)
Persistent on [place] Add elavil

## 2016-08-29 ENCOUNTER — Encounter: Payer: Self-pay | Admitting: Family Medicine

## 2016-08-29 ENCOUNTER — Other Ambulatory Visit: Payer: Self-pay | Admitting: Family Medicine

## 2016-08-29 DIAGNOSIS — I10 Essential (primary) hypertension: Secondary | ICD-10-CM

## 2016-08-29 DIAGNOSIS — J029 Acute pharyngitis, unspecified: Secondary | ICD-10-CM

## 2016-08-29 MED ORDER — AMOXICILLIN 875 MG PO TABS: 875.0000 mg | ORAL_TABLET | Freq: Two times a day (BID) | ORAL | 0 refills | Status: DC

## 2016-08-29 NOTE — Telephone Encounter (Signed)
Medication request.

## 2016-08-29 NOTE — Telephone Encounter (Signed)
Patient concern

## 2016-09-02 MED ORDER — LISINOPRIL 30 MG PO TABS: 30.0000 mg | ORAL_TABLET | Freq: Every day | ORAL | 5 refills | Status: DC

## 2016-09-03 ENCOUNTER — Other Ambulatory Visit: Payer: Self-pay | Admitting: Family Medicine

## 2016-09-03 DIAGNOSIS — G4700 Insomnia, unspecified: Secondary | ICD-10-CM

## 2016-09-10 ENCOUNTER — Other Ambulatory Visit: Payer: Self-pay | Admitting: Family Medicine

## 2016-09-10 DIAGNOSIS — G894 Chronic pain syndrome: Secondary | ICD-10-CM

## 2016-09-16 ENCOUNTER — Other Ambulatory Visit: Payer: Self-pay | Admitting: Family Medicine

## 2016-09-16 DIAGNOSIS — G4700 Insomnia, unspecified: Secondary | ICD-10-CM

## 2016-09-17 ENCOUNTER — Other Ambulatory Visit: Payer: Self-pay | Admitting: Family Medicine

## 2016-09-17 DIAGNOSIS — G894 Chronic pain syndrome: Secondary | ICD-10-CM

## 2016-09-22 ENCOUNTER — Encounter: Payer: Self-pay | Admitting: Family Medicine

## 2016-09-23 NOTE — Telephone Encounter (Signed)
Patient concern

## 2016-09-24 ENCOUNTER — Telehealth: Payer: Self-pay | Admitting: Family Medicine

## 2016-09-24 ENCOUNTER — Encounter: Payer: Self-pay | Admitting: Obstetrics and Gynecology

## 2016-09-24 ENCOUNTER — Encounter: Payer: Medicaid Other | Admitting: Obstetrics and Gynecology

## 2016-09-24 DIAGNOSIS — F5101 Primary insomnia: Secondary | ICD-10-CM

## 2016-09-24 DIAGNOSIS — G894 Chronic pain syndrome: Secondary | ICD-10-CM

## 2016-09-24 MED ORDER — GABAPENTIN 100 MG PO CAPS: 300.0000 mg | ORAL_CAPSULE | Freq: Three times a day (TID) | ORAL | 3 refills | Status: DC

## 2016-09-24 MED ORDER — ZOLPIDEM TARTRATE 10 MG PO TABS: 10.0000 mg | ORAL_TABLET | Freq: Every day | ORAL | 2 refills | Status: DC

## 2016-09-24 NOTE — Progress Notes (Signed)
Patient did not keep GYN appointment for 09/24/2016.  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

## 2016-09-24 NOTE — Telephone Encounter (Signed)
Called patient  verified name and DOB Called in response to [employer]  She reports trouble sleeping and increased anxiety x 3 weeks. She she has also chronic L sided back related to scoliosis.   This coincides with her mother who has dementia requiring hospitalization  She is taking elavil 5 mg nightly No Ambien, did not refill No, lyrica needs prior authorization    Plan: Gabapentin 100 mg TID Continue elavil 50 mg nightly Add back Ambien 10 mg nightly  Patient agrees with plan and voices understanding  Called in Ambien refill to patient's [facility], 30 with 2 refills

## 2016-09-24 NOTE — Addendum Note (Signed)
Addended by: [name] on: 09/24/2016 05:15 PM   Modules accepted: Orders

## 2016-09-24 NOTE — Telephone Encounter (Signed)
Patient called.

## 2016-09-24 NOTE — Telephone Encounter (Signed)
Called patient  Verified name and DOB She reports over the past 3 weeks she has had increased anxiety feeling nervous. She is not sleeping at all.

## 2016-10-08 ENCOUNTER — Other Ambulatory Visit (HOSPITAL_BASED_OUTPATIENT_CLINIC_OR_DEPARTMENT_OTHER): Payer: Self-pay

## 2016-10-08 DIAGNOSIS — G4730 Sleep apnea, unspecified: Secondary | ICD-10-CM

## 2016-10-08 DIAGNOSIS — R0683 Snoring: Secondary | ICD-10-CM

## 2016-10-08 DIAGNOSIS — R454 Irritability and anger: Secondary | ICD-10-CM

## 2016-10-08 DIAGNOSIS — G4710 Hypersomnia, unspecified: Secondary | ICD-10-CM

## 2016-10-11 ENCOUNTER — Other Ambulatory Visit: Payer: Self-pay | Admitting: Family Medicine

## 2016-10-11 DIAGNOSIS — G894 Chronic pain syndrome: Secondary | ICD-10-CM

## 2016-10-29 ENCOUNTER — Other Ambulatory Visit: Payer: Self-pay | Admitting: Family Medicine

## 2016-10-29 DIAGNOSIS — G894 Chronic pain syndrome: Secondary | ICD-10-CM

## 2016-11-04 ENCOUNTER — Encounter: Payer: Self-pay | Admitting: Family Medicine

## 2016-11-05 NOTE — Telephone Encounter (Signed)
Patient concern

## 2016-11-21 ENCOUNTER — Telehealth: Payer: Self-pay | Admitting: Family Medicine

## 2016-11-21 DIAGNOSIS — M4120 Other idiopathic scoliosis, site unspecified: Secondary | ICD-10-CM

## 2016-11-21 NOTE — Telephone Encounter (Signed)
Please call patient, no letter yet received from [facility]. She is asked to contact them to fax another request   I have responded to her e-mail, I apologize for the delay I have also placed a referral to ortho for a second opinion.

## 2016-11-21 NOTE — Telephone Encounter (Signed)
Pt came is asking about a letter that should have been filled out and sent back to [facility] for her electric wheelchair. Also inquiring as to why she did not get a response to the email that she sent to Dr Adrian Blackwater on [place]. Please f/u.

## 2016-11-22 ENCOUNTER — Encounter (HOSPITAL_BASED_OUTPATIENT_CLINIC_OR_DEPARTMENT_OTHER): Payer: Medicaid Other

## 2016-11-25 NOTE — Telephone Encounter (Signed)
Pt was called and informed to call company and send over another request.

## 2016-12-01 ENCOUNTER — Other Ambulatory Visit: Payer: Self-pay | Admitting: Family Medicine

## 2016-12-01 DIAGNOSIS — G894 Chronic pain syndrome: Secondary | ICD-10-CM

## 2016-12-02 NOTE — Telephone Encounter (Signed)
[place] round form completed To be faxed and original to be scanned into chart

## 2016-12-08 ENCOUNTER — Other Ambulatory Visit: Payer: Self-pay | Admitting: Family Medicine

## 2016-12-08 DIAGNOSIS — G4700 Insomnia, unspecified: Secondary | ICD-10-CM

## 2016-12-10 ENCOUNTER — Encounter (INDEPENDENT_AMBULATORY_CARE_PROVIDER_SITE_OTHER): Payer: Self-pay | Admitting: Specialist

## 2016-12-10 ENCOUNTER — Ambulatory Visit (INDEPENDENT_AMBULATORY_CARE_PROVIDER_SITE_OTHER): Payer: Medicaid Other | Admitting: Specialist

## 2016-12-10 ENCOUNTER — Ambulatory Visit (INDEPENDENT_AMBULATORY_CARE_PROVIDER_SITE_OTHER): Payer: Self-pay

## 2016-12-10 VITALS — BP 162/89 | HR 96 | Ht <= 58 in | Wt 230.0 lb

## 2016-12-10 DIAGNOSIS — M4105 Infantile idiopathic scoliosis, thoracolumbar region: Secondary | ICD-10-CM

## 2016-12-10 DIAGNOSIS — M549 Dorsalgia, unspecified: Secondary | ICD-10-CM | POA: Diagnosis not present

## 2016-12-10 DIAGNOSIS — M4103 Infantile idiopathic scoliosis, cervicothoracic region: Secondary | ICD-10-CM | POA: Diagnosis not present

## 2016-12-10 DIAGNOSIS — M5416 Radiculopathy, lumbar region: Secondary | ICD-10-CM | POA: Diagnosis not present

## 2016-12-10 DIAGNOSIS — M160 Bilateral primary osteoarthritis of hip: Secondary | ICD-10-CM | POA: Diagnosis not present

## 2016-12-10 DIAGNOSIS — R202 Paresthesia of skin: Secondary | ICD-10-CM

## 2016-12-10 DIAGNOSIS — M25512 Pain in left shoulder: Secondary | ICD-10-CM | POA: Diagnosis not present

## 2016-12-10 DIAGNOSIS — R200 Anesthesia of skin: Secondary | ICD-10-CM | POA: Diagnosis not present

## 2016-12-10 DIAGNOSIS — M545 Low back pain: Secondary | ICD-10-CM | POA: Diagnosis not present

## 2016-12-10 MED ORDER — DICLOFENAC SODIUM 75 MG PO TBEC
75.0000 mg | DELAYED_RELEASE_TABLET | Freq: Two times a day (BID) | ORAL | 3 refills | Status: DC
Start: 2016-12-10 — End: 2017-01-30

## 2016-12-10 NOTE — Patient Instructions (Addendum)
Avoid bending, stooping and avoid lifting weights greater than 10 lbs. Avoid prolong standing and walking. Avoid frequent bending and stooping  No lifting greater than 10 lbs. May use ice or moist heat for pain. Weight loss is of benefit. Handicap license is approved.  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 Well padded shoes help. Ice the hips 2-3 times a day 15-20 mins at a time. We will make an appointment for you to see Dr. Ninfa Linden about your hip arthritis. MRI under general anesthesia of your thoracic spine and left shoulder.  Gabapentin should be increased to as much as Dr. Adrian Blackwater feels comfortable with. EMG/NVC of the left arm is to assess for a left C8 T1 rad vs ulnar neuropathy at the left elbow.

## 2016-12-10 NOTE — Addendum Note (Signed)
Addended by: Basil Dess on: 12/10/2016 04:44 PM   Modules accepted: Orders

## 2016-12-10 NOTE — Progress Notes (Addendum)
Office Visit Note   Patient: Briana Williams           Date of Birth: [birth year]           MRN: [id number] Visit Date: 12/10/2016              Requested by: [name], MD [address] [place], [place] [address] PCP: [name], MD   Assessment & Plan: Visit Diagnoses:  1. Low back pain, unspecified back pain laterality, unspecified chronicity, with sciatica presence unspecified   2. Mid back pain   3. Infantile idiopathic scoliosis of thoracolumbar region   4. Infantile idiopathic scoliosis, cervicothoracic region   5. Left shoulder pain, unspecified chronicity   6. Numbness and tingling of left arm and leg   7. Bilateral primary osteoarthritis of hip   8. Chronic left lumbar radiculopathy     Plan:Avoid bending, stooping and avoid lifting weights greater than 10 lbs. Avoid prolong standing and walking. Avoid frequent bending and stooping  No lifting greater than 10 lbs. May use ice or moist heat for pain. Weight loss is of benefit. Handicap license is approved.  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 Well padded shoes help. Ice the hips 2-3 times a day 15-20 mins at a time. We will make an appointment for you to see Dr. Ninfa Linden about your hip arthritis. MRI under general anesthesia of your thoracic spine and left shoulder.  Gabapentin should be increased to as much as Dr. Adrian Blackwater feels comfortable with. EMG/NVC of the left arm is to assess for a left C8 T1 rad vs ulnar neuropathy at the left elbow.     Follow-Up Instructions: Return in about 3 weeks (around 12/31/2016) for Please make an appointment for hip replacement evaluation with Dr. Ninfa Linden, bilateral hip osteoartt.   Orders:  Orders Placed This Encounter  Procedures  . XR Lumbar Spine 2-3 Views  . XR Thoracic Spine 2 View  . MR THORACIC SPINE LIMITED WO CONTRAST  . MR Shoulder Left w/o contrast  . CT  LUMBAR SPINE WO CONTRAST  . Ambulatory referral to Physical Medicine Rehab   Meds ordered this encounter  Medications  . diclofenac (VOLTAREN) 75 MG EC tablet    Sig: Take 1 tablet (75 mg total) by mouth 2 (two) times daily.    Dispense:  60 tablet    Refill:  3      Procedures: No procedures performed   Clinical Data: No additional findings.   Subjective: Chief Complaint  Patient presents with  . Middle Back - Pain  . Lower Back - Pain    36 year old female with a long history of cervicothoracolumbar scoliosis first notices as a 12 1/2 year old infant, it was followed [facility] in [place], [place] until a  Thoracolumbar fusion in [facility] in [place] to the sacrum from the base of the neck at 9 years, no brace. This went on to heal and she was followed until she was 36 years old. Dr. Carloyn Manner removed the rods part by part by Dr. Carloyn Manner from age 48 for infection of the rods that occurred after a spinal tap that was done to rule out spinal meningitis With temperature and head aches and fevers and requiring packing in ice before she had spinal tap. Dr. Lars Mage was the ID medicine doctor.  Now she also has had  Surgery of the neck for cervical stenosis by  Dr. Atilano Ina in 2014, decompression with fusion with plate and screws. Dr. Carloyn Manner did ACDF with a plate anteriorly in 2409. She had a decompression of the cranial sutures as a child at 50 months of age with Dr. Durward Fortes and Dr. Coletta Memos.  The decompression and opening of the cranial synedesmosis healed well. Prior to the neck surgery she was experiencing severe numbness in the left arm and neck pain. This surgery was at the base of the neck.  She has normal bowel and bladder. She does experience episodes of decrease sensation from the waist downwards, the last episode was above one year, with numbness, she is able to walk holding onto items she is good. Bowel and bladder incontinence with these episode, she had 2  episodes in a 6 month span. She was told it is her scoliosis that caused it. She had left neck and left posterior shoulder pain about the upper left scapula. She also experiencing numbness and burning sensation along the lateral aspect of the left forearm and into the ulnar 3 fingers. The numbness is worsening.. She feel weakness every now and then into the left arm. She feels clumbsy in the left arm with dropping of item. The numbness is constant more recently, previously it came and went. Turning the head to the left side increases the pain with a popping sensation an audible popping, feels grating or grinding. The grinding is painful. The right arm is weak but not like the left, she can deal with the right compared with the left sided arm pain. She has pain in the lower lumbar spine not the lower dorsal spine, she does have to touch the left parascapula area medially. Complains of midline lumbar pain at the inward curve of the lumbar spine, L2-4. Complains of pain with bending and stooping, feels off balance with the left leg.Complains of left lower hip in the buttock area and the back of the left thigh but also the inner left thigh to the left foot plantar mainly bottom inside of the foot and calf. The left leg wants to give away, she has difficulty with stairs going up one at a time leading on the right side. The right leg has given away and she has "face planted", feeding the dogs and also one time falling between the toilet and the tub, breaking the toilet. Pain in the left upper scapula is constant sharp, stabbing came on I' not sure it's been so long. Neck popping,not shoulders. Carpal tunnel worse in the left greater than right. She had EMG/[facility]CV at [facility] one year ago, no return told not much he could do.  Has seen the orthopedic MD in [facility] and  He injected the left shoulder, the shoulder was swollen inside, had injection without help, Dr. Fransisco Beau. She has undergone MRI of her lumbar spine at  [facility]. She is still in a pain management program with [facility], [address]  [place], [place].She sleeps 2.5-3 hours, she is unable to lie on her back And has to turn constantly. She passes her water rarely at nighttime. She is not able to get comfortable to save her soul. Sleeps more on the left side and then has to switch Over. Unable to walk a distance from office room to the car parking lot, has to take a break, left leg drags, she has to lean of or sit. Can not stand for more than 5 minutes with out leaning or stooping or sitting. Grocery shops  with an electric cart or a wheel chair. She has a hoverround she uses to get from car to shop and it is carried on the back of the [place]. She has had to use the hoverround for about 3 years, within time the waist down has been giving her trouble. Dr. Selinda Orion, pain management in [place] Provide the prescription. Can walk with assistance, made it from the front of the office to the room walking with some assistance.Left buttock pain is sharp like hot poker into  The left posterior and medial thigh and leg. The right leg does not hurt, it has its days but not like the left.       Review of Systems  HENT: Negative.   Eyes: Negative.   Respiratory: Negative.   Cardiovascular: Negative.   Gastrointestinal: Negative.   Endocrine: Negative.   Genitourinary: Negative.   Musculoskeletal: Positive for back pain, joint swelling and neck pain. Negative for neck stiffness.  Skin: Negative.   Allergic/Immunologic: Negative.   Neurological: Positive for weakness and numbness.  Hematological: Negative.   Psychiatric/Behavioral: Negative.      Objective: Vital Signs: BP (!) 162/89 (BP Location: Left Arm, Patient Position: Sitting)   Pulse 96   Ht 4' 8.5" (1.435 m)   Wt 230 lb (104.3 kg)   BMI 50.66 kg/m   Physical Exam  Constitutional: She is oriented to person, place, and time. She appears well-developed and  well-nourished.  HENT:  Head: Normocephalic and atraumatic.  Eyes: EOM are normal. Pupils are equal, round, and reactive to light.  Neck: Normal range of motion. Neck supple.  Pulmonary/Chest: Effort normal and breath sounds normal.  Abdominal: Soft. Bowel sounds are normal.  Neurological: She is alert and oriented to person, place, and time.  Skin: Skin is warm and dry.  Psychiatric: She has a normal mood and affect. Her behavior is normal. Judgment and thought content normal.    Right Hip Exam   Range of Motion  Flexion:  110 abnormal  Internal Rotation: 10  External Rotation:  30 abnormal  Abduction:  20 abnormal  Adduction:  10 abnormal   Muscle Strength  Abduction: 5/5  Adduction: 5/5  Flexion: 5/5   Tests  FABER: positive  Other  Erythema: absent Scars: absent Sensation: normal Pulse: present   Left Hip Exam   Range of Motion  Flexion:  100 abnormal  Internal Rotation: 0  External Rotation:  20 abnormal  Abduction: 15  Adduction:  10 abnormal   Muscle Strength  Abduction: 5/5  Adduction: 5/5  Flexion: 5/5   Tests  FABER: positive Ober: positive  Other  Erythema: absent Scars: absent Sensation: normal Pulse: present  Comments:  Left posterior iliac crest graft site harvest.   Back Exam   Tenderness  The patient is experiencing tenderness in the lumbar.  Range of Motion  Extension:  0 abnormal  Flexion: 70  Lateral Bend Right: abnormal  Lateral Bend Left: abnormal  Rotation Right: abnormal  Rotation Left: abnormal   Muscle Strength  Right Quadriceps:  5/5  Left Quadriceps:  5/5  Right Hamstrings:  5/5  Left Hamstrings:  5/5   Reflexes  Patellar: Hyporeflexic Achilles: Hyporeflexic Babinski's sign: normal   Other  Toe Walk: normal Heel Walk: normal Sensation: normal Gait: short leg  Erythema: no back redness Scars: present  Comments:  Standing with stooped forward position, hips are flexed and the knees a reflexed.  Unable to stand upright with neutral extension she is kyphotic the entire thoracolumbar  spine.  Left knee 4/5 extension, left knee reflex 1+ the right 2+, ankles 0 and symmetric. Left upper thoracic hump and right lumbar hump. She is decompensated to the left side.      Specialty Comments:  No specialty comments available.  Imaging: No results found.   PMFS History: Patient Active Problem List   Diagnosis Date Noted  . Pruritus 08/23/2016  . Irregular menstrual cycle 05/30/2016  . Chronic pain syndrome 09/21/2015  . HYPERTRIGLYCERIDEMIA 05/02/2009  . 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 Insomnia 05/01/2009  . SCOLIOSIS 05/18/2008  . Diabetes type 2, uncontrolled ([place]) 04/18/2008  . DEPRESSION 04/18/2008  . Essential hypertension, benign 04/18/2008   Past Medical History:  Diagnosis Date  . Hyperlipidemia   . Hypertension associated with diabetes ([place])   . Renal disorder   . Scoliosis   . Type 2 diabetes mellitus treated with insulin ([facility])     Family History  Problem Relation Age of Onset  . Cancer Maternal Grandmother        mandibular cancer   . Diabetes Mother   . Heart failure Mother   . CAD Mother        triple CABG  . Diabetes Maternal Aunt   . Stroke Father     Past Surgical History:  Procedure Laterality Date  . BACK SURGERY    . CERVICAL FUSION    . frontal closed sutures to forehead    . Harrington Rod placement    . HIP SURGERY     staph infection  . LUMBAR FUSION     Social History   Occupational History  .  Unemployed   Social History Main Topics  . Smoking status: Never Smoker  . Smokeless tobacco: Never Used  . Alcohol use No  . Drug use: No  . Sexual activity: Yes    Birth control/ protection: Other-see comments     Comment: unable to have children d/t scoliosis

## 2016-12-10 NOTE — Telephone Encounter (Signed)
  Reviewed [facility] Controlled Substance Reporting System. No unauthorized fills of controlled medications She is prescribed oxycodone by pain management. Ambien and xanax by me.  She has a controlled substance contract on file.   Please inform patient, Xanax Rx ready for pick up

## 2016-12-11 ENCOUNTER — Encounter: Payer: Self-pay | Admitting: Family Medicine

## 2016-12-11 LAB — C-REACTIVE PROTEIN: CRP: 5.3 mg/L (ref <8.0)

## 2016-12-11 LAB — ANA: Anti Nuclear Antibody(ANA): NEGATIVE

## 2016-12-11 LAB — SEDIMENTATION RATE: Sed Rate: 21 mm/hr — ABNORMAL HIGH (ref 0–20)

## 2016-12-11 LAB — RHEUMATOID FACTOR

## 2016-12-13 ENCOUNTER — Telehealth (INDEPENDENT_AMBULATORY_CARE_PROVIDER_SITE_OTHER): Payer: Self-pay | Admitting: Specialist

## 2016-12-13 NOTE — Telephone Encounter (Signed)
Approval done and has been sent to her pharmacy

## 2016-12-13 NOTE — Telephone Encounter (Signed)
PT STATED HER MEDS SHE GOT FROM DR NITKA NEEDS PRE AUTH SENT TO PHARMACY PLEASE.  [phone]

## 2016-12-20 ENCOUNTER — Ambulatory Visit (INDEPENDENT_AMBULATORY_CARE_PROVIDER_SITE_OTHER): Payer: Medicaid Other | Admitting: Physical Medicine and Rehabilitation

## 2016-12-20 ENCOUNTER — Encounter (INDEPENDENT_AMBULATORY_CARE_PROVIDER_SITE_OTHER): Payer: Self-pay | Admitting: Physical Medicine and Rehabilitation

## 2016-12-20 DIAGNOSIS — R202 Paresthesia of skin: Secondary | ICD-10-CM | POA: Diagnosis not present

## 2016-12-20 NOTE — Progress Notes (Deleted)
Left arm pain and numbness into fingers. Constant. Numbness getting worse recently. Loss of strength in arm and hand. States she drops things alot. Sharp stabbing pains in left shoulder.Right hand dominant. No lotion.

## 2016-12-20 NOTE — Procedures (Signed)
EMG & NCV Findings: Evaluation of the left median motor nerve showed prolonged distal onset latency (5.9 ms), reduced amplitude (4.6 mV), and decreased conduction velocity (Elbow-Wrist, 42 m/s).  The left median (across palm) sensory nerve showed prolonged distal peak latency (5.5 ms) and reduced amplitude (8.3 V).  All remaining nerves (as indicated in the following tables) were within normal limits.    Needle evaluation of the left first dorsal interosseous muscle  and left abductor pollicis brevis muscle showed increased spontaneous activity and diminished recruitment.  The left extensor digitorum communis muscle showed diminished recruitment.  All remaining muscles (as indicated in the following table) showed no evidence of electrical instability.    Impression: The above electrodiagnostic study is ABNORMAL and reveals evidence of 1.  Moderate chronic C8 radiculopathy on the left.  Patient has prior ACDF. MRI of the cervical spine and I believe is on order.  2.  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 She has had 3 electrodiagnostic studies all showing moderate plus median neuropathy at the wrist. This goes back as far as 2009. Clinically her current complaints of the paresthesias into the third fourth and fifth digit did not fit with a median neuropathy.  There is no significant electrodiagnostic evidence of any other brachial plexopathy or significant findings suggestive of peripheral polyneuropathy.  Recommendations: 1.  Follow-up with referring physician. 2.  Continue current management of symptoms. 3.  MRI of the cervical spine may shed some light into her pain complaints. Clinically her symptoms do not fit with carpal tunnel syndrome at the moment.      Nerve Conduction Studies Anti Sensory Summary Table   Stim Site NR Peak (ms) Norm Peak (ms) P-T Amp (V) Norm P-T Amp Site1 Site2 Delta-P (ms)  Dist (cm) Vel (m/s) Norm Vel (m/s)  Left Median Acr Palm Anti Sensory (2nd Digit)  31.5C  Wrist    *5.5 <3.6 *8.3 >10         Motor Summary Table   Stim Site NR Onset (ms) Norm Onset (ms) O-P Amp (mV) Norm O-P Amp Site1 Site2 Delta-0 (ms) Dist (cm) Vel (m/s) Norm Vel (m/s)  Left Median Motor (Abd Poll Brev)  31.5C  Wrist    *5.9 <4.2 *4.6 >5 Elbow Wrist 5.1 21.5 *42 >50  Elbow    11.0  6.2         Left Ulnar Motor (Abd Dig Min)  31.6C  Wrist    3.6 <4.2 5.8 >3 B Elbow Wrist 4.1 22.5 55 >53  B Elbow    7.7  4.0  A Elbow B Elbow 1.8 10.2 57 >53  A Elbow    9.5  3.8          EMG   Side Muscle Nerve Root Ins Act Fibs Psw Amp Dur Poly Recrt Int Fraser Din Comment  Left 1stDorInt Ulnar C8-T1 Nml *3+ *3+ Nml Nml 0 *Reduced Nml   Left Abd Poll Brev Median C8-T1 Inc *2+ *2+ Nml Nml 0 Nml Nml   Left ExtDigCom   Nml Nml Nml Nml Nml 0 *Reduced Nml   Left Triceps Radial C6-7-8 Nml Nml Nml Nml Nml 0 Nml Nml   Left Deltoid Axillary C5-6 Nml Nml Nml Nml Nml 0 Nml Nml     Nerve Conduction Studies Anti Sensory Left/Right Comparison   Stim Site L Lat (ms) R Lat (ms) L-R Lat (ms) L Amp (V) R Amp (V) L-R Amp (%)  Site1 Site2 L Vel (m/s) R Vel (m/s) L-R Vel (m/s)  Median Acr Palm Anti Sensory (2nd Digit)  31.5C  Wrist *5.5   *8.3          Motor Left/Right Comparison   Stim Site L Lat (ms) R Lat (ms) L-R Lat (ms) L Amp (mV) R Amp (mV) L-R Amp (%) Site1 Site2 L Vel (m/s) R Vel (m/s) L-R Vel (m/s)  Median Motor (Abd Poll Brev)  31.5C  Wrist *5.9   *4.6   Elbow Wrist *42    Elbow 11.0   6.2         Ulnar Motor (Abd Dig Min)  31.6C  Wrist 3.6   5.8   B Elbow Wrist 55    B Elbow 7.7   4.0   A Elbow B Elbow 57    A Elbow 9.5   3.8

## 2016-12-20 NOTE — Progress Notes (Signed)
Briana Williams - 36 y.o. female MRN [id number]  Date of birth: 03/25/1981  Office Visit Note: Visit Date: 12/20/2016 PCP: [name], MD Referred by: [name], MD  Subjective: Chief Complaint  Patient presents with  . Left Shoulder - Pain  . Left Arm - Pain, Numbness, Weakness   HPI: Briana Williams is a 36 year old right-hand dominant female with significant history of scoliosis and lumbar surgery and many year history of nerve and spine pain. I asked he saw her in 2009 as a self-referral we completed electrodiagnostic studies of the upper extremities. 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 At that time she did see Dr. Almira Bar in our office but nothing was completed in terms of any surgery of the wrists.  I think she also saw Dr. Lorin Mercy at that time for evaluation of her cervical spine. After that she was lost to follow-up in our office. She evidently had an ACDF performed by Dr. Carloyn Manner in 2010. She now has seen Dr. Louanne Skye for evaluation in our office. He requests repeat electrodiagnostic studies of the left upper extremity. She is also scheduled for MRI of the cervical spine. She evidently also is going to need potential bilateral hip replacements. She is seeing Dr. Ninfa Linden for follow-up for that. In terms of electrodiagnostic study she did have a repeat study last year at [facility]. 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 There are only able to complete 3 needle evaluations. Interestingly the left FDI muscle did show denervation potentials on the needle EMG. They did not have findings on the APB. She said the needle EMG test was excruciating and she had to stop. She is very anxious today about really not having that done if possible. We did talk over this and long length and she did want to go ahead  with the procedure today. I do think this and she had a recent study that we can probably get by with just a sensory and motor study of the median nerve as well as a motor study of the ulnar nerve and then hopefully full needle EMG. She also does not remember the electrodiagnostic study we performed in 2009 or actually seeing me in 2009.  Her current clinical symptoms are left arm pain with numbness and tingling constantly down into the digits and she fairly emphatically demonstrates digits 3,4 and 5. She reports numbness is getting worse recently. She also reports loss of strength in the arm and hand. She thinks that she is dropping more objects lately. She gets some sharp stabbing pains in the left shoulder.    ROS Otherwise per HPI.  Assessment & Plan: Visit Diagnoses:  1. Paresthesia of skin     Plan: No additional findings.  Impression: The above electrodiagnostic study is ABNORMAL and reveals evidence of 1.  Moderate chronic C8 radiculopathy on the left.  Patient has prior ACDF. MRI of the cervical spine and I believe is on order.  2.  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 She has had 3 electrodiagnostic studies all showing moderate plus median neuropathy at the wrist. This goes back as far as 2009. Clinically her current complaints of the paresthesias into the third fourth and fifth digit did not fit with a median neuropathy.  There  is no significant electrodiagnostic evidence of any other brachial plexopathy or significant findings suggestive of peripheral polyneuropathy.  Recommendations: 1.  Follow-up with referring physician. 2.  Continue current management of symptoms. 3.  MRI of the cervical spine may shed some light into her pain complaints. Clinically her symptoms do not fit with carpal tunnel syndrome at the moment.   Meds & Orders: No orders of the defined types were placed in this  encounter.   Orders Placed This Encounter  Procedures  . NCV with EMG (electromyography)    Follow-up: No Follow-up on file.   Procedures: No procedures performed  EMG & NCV Findings: Evaluation of the left median motor nerve showed prolonged distal onset latency (5.9 ms), reduced amplitude (4.6 mV), and decreased conduction velocity (Elbow-Wrist, 42 m/s).  The left median (across palm) sensory nerve showed prolonged distal peak latency (5.5 ms) and reduced amplitude (8.3 V).  All remaining nerves (as indicated in the following tables) were within normal limits.    Needle evaluation of the left first dorsal interosseous muscle  and left abductor pollicis brevis muscle showed increased spontaneous activity and diminished recruitment.  The left extensor digitorum communis muscle showed diminished recruitment.  All remaining muscles (as indicated in the following table) showed no evidence of electrical instability.    Impression: The above electrodiagnostic study is ABNORMAL and reveals evidence of 1.  Moderate chronic C8 radiculopathy on the left.  Patient has prior ACDF. MRI of the cervical spine and I believe is on order.  2.  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 She has had 3 electrodiagnostic studies all showing moderate plus median neuropathy at the wrist. This goes back as far as 2009. Clinically her current complaints of the paresthesias into the third fourth and fifth digit did not fit with a median neuropathy.  There is no significant electrodiagnostic evidence of any other brachial plexopathy or significant findings suggestive of peripheral polyneuropathy.  Recommendations: 1.  Follow-up with referring physician. 2.  Continue current management of symptoms. 3.  MRI of the cervical spine may shed some light into her pain complaints. Clinically her symptoms do not fit with carpal tunnel  syndrome at the moment.      Nerve Conduction Studies Anti Sensory Summary Table   Stim Site NR Peak (ms) Norm Peak (ms) P-T Amp (V) Norm P-T Amp Site1 Site2 Delta-P (ms) Dist (cm) Vel (m/s) Norm Vel (m/s)  Left Median Acr Palm Anti Sensory (2nd Digit)  31.5C  Wrist    *5.5 <3.6 *8.3 >10         Motor Summary Table   Stim Site NR Onset (ms) Norm Onset (ms) O-P Amp (mV) Norm O-P Amp Site1 Site2 Delta-0 (ms) Dist (cm) Vel (m/s) Norm Vel (m/s)  Left Median Motor (Abd Poll Brev)  31.5C  Wrist    *5.9 <4.2 *4.6 >5 Elbow Wrist 5.1 21.5 *42 >50  Elbow    11.0  6.2         Left Ulnar Motor (Abd Dig Min)  31.6C  Wrist    3.6 <4.2 5.8 >3 B Elbow Wrist 4.1 22.5 55 >53  B Elbow    7.7  4.0  A Elbow B Elbow 1.8 10.2 57 >53  A Elbow    9.5  3.8          EMG   Side Muscle Nerve Root Ins Act Fibs Psw Amp Dur Poly Recrt  Int Fraser Din Comment  Left 1stDorInt Ulnar C8-T1 Nml *3+ *3+ Nml Nml 0 *Reduced Nml   Left Abd Poll Brev Median C8-T1 Inc *2+ *2+ Nml Nml 0 Nml Nml   Left ExtDigCom   Nml Nml Nml Nml Nml 0 *Reduced Nml   Left Triceps Radial C6-7-8 Nml Nml Nml Nml Nml 0 Nml Nml   Left Deltoid Axillary C5-6 Nml Nml Nml Nml Nml 0 Nml Nml     Nerve Conduction Studies Anti Sensory Left/Right Comparison   Stim Site L Lat (ms) R Lat (ms) L-R Lat (ms) L Amp (V) R Amp (V) L-R Amp (%) Site1 Site2 L Vel (m/s) R Vel (m/s) L-R Vel (m/s)  Median Acr Palm Anti Sensory (2nd Digit)  31.5C  Wrist *5.5   *8.3          Motor Left/Right Comparison   Stim Site L Lat (ms) R Lat (ms) L-R Lat (ms) L Amp (mV) R Amp (mV) L-R Amp (%) Site1 Site2 L Vel (m/s) R Vel (m/s) L-R Vel (m/s)  Median Motor (Abd Poll Brev)  31.5C  Wrist *5.9   *4.6   Elbow Wrist *42    Elbow 11.0   6.2         Ulnar Motor (Abd Dig Min)  31.6C  Wrist 3.6   5.8   B Elbow Wrist 55    B Elbow 7.7   4.0   A Elbow B Elbow 57    A Elbow 9.5   3.8               Clinical History: Electrodiagnostic study 09/07/2015 at [facility] Abnormal but incomplete study.    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     There is electrodiagnostic evidence of patchy sensory peripheral neuropathy  as can be seen in diabetes.   Clinical correlation is advised.  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  She reports that she has never smoked. She has never used smokeless tobacco.   Recent Labs  05/30/16 1602 08/22/16 1515  HGBA1C 10.1 9.8    Objective:  VS:  HT:    WT:   BMI:     BP:   HR: bpm  TEMP: ( )  RESP:  Physical Exam  Musculoskeletal:  Inspection reveals multiple tattoos on the left forearm but no atrophy of the bilateral APB or FDI or hand intrinsics. There is no swelling, color changes, allodynia or dystrophic changes. There is 5 out of 5 strength in the bilateral wrist extension, finger abduction and long finger flexion. There is a negative Hoffmann's test bilaterally.    Ortho Exam Imaging: No results found.  Past Medical/Family/Surgical/Social History: Medications & Allergies reviewed per EMR Patient Active Problem List   Diagnosis Date Noted  . Pruritus 08/23/2016  . Irregular menstrual cycle 05/30/2016  . Chronic pain syndrome 09/21/2015  . HYPERTRIGLYCERIDEMIA 05/02/2009  . 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 Insomnia 05/01/2009  . SCOLIOSIS 05/18/2008  . Diabetes type 2, uncontrolled ([place]) 04/18/2008  . DEPRESSION 04/18/2008  . Essential hypertension, benign 04/18/2008   Past Medical History:  Diagnosis Date  . Hyperlipidemia   .  Hypertension associated  with diabetes ([place])   . Renal disorder   . Scoliosis   . Type 2 diabetes mellitus treated with insulin (HCC)    Family History  Problem Relation Age of Onset  . Cancer Maternal Grandmother        mandibular cancer   . Diabetes Mother   . Heart failure Mother   . CAD Mother        triple CABG  . Diabetes Maternal Aunt   . Stroke Father    Past Surgical History:  Procedure Laterality Date  . BACK SURGERY    . CERVICAL FUSION    . frontal closed sutures to forehead    . Harrington Rod placement    . HIP SURGERY     staph infection  . LUMBAR FUSION     Social History   Occupational History  .  Unemployed   Social History Main Topics  . Smoking status: Never Smoker  . Smokeless tobacco: Never Used  . Alcohol use No  . Drug use: No  . Sexual activity: Yes    Birth control/ protection: Other-see comments     Comment: unable to have children d/t scoliosis

## 2016-12-24 ENCOUNTER — Ambulatory Visit (INDEPENDENT_AMBULATORY_CARE_PROVIDER_SITE_OTHER): Payer: Medicaid Other | Admitting: Orthopaedic Surgery

## 2016-12-25 ENCOUNTER — Other Ambulatory Visit (INDEPENDENT_AMBULATORY_CARE_PROVIDER_SITE_OTHER): Payer: Self-pay

## 2016-12-25 ENCOUNTER — Ambulatory Visit (INDEPENDENT_AMBULATORY_CARE_PROVIDER_SITE_OTHER): Payer: Medicaid Other

## 2016-12-25 ENCOUNTER — Ambulatory Visit (INDEPENDENT_AMBULATORY_CARE_PROVIDER_SITE_OTHER): Payer: Medicaid Other | Admitting: Orthopaedic Surgery

## 2016-12-25 DIAGNOSIS — M25552 Pain in left hip: Secondary | ICD-10-CM

## 2016-12-25 DIAGNOSIS — M25551 Pain in right hip: Secondary | ICD-10-CM | POA: Diagnosis not present

## 2016-12-25 NOTE — Progress Notes (Signed)
The patient is new to me but she is a patient established of our group. My partner Dr. Louanne Skye needed me to see her to evaluate her for hip replacement surgery. She is only 36 years old but has significant scoliosis and has had multiple surgeries over the years. Her left hip hurts worse the right hip hurts globally around her entire hip. She actually had intra-articular hip injection by Dr. Ernestina Patches and she said the injection didn't help a single but not even for a day or a few minutes. She walks significantly leaned over and again there is an obvious tilt her pelvis as well. She says her left leg is definitely shorter than her right side. This is definitely related to her scoliosis.  On examination any attempts of internal right rotation of her left hip causes severe and debilitating pain. This is true on the right side as well but not as bad a left side. She said her pain is deathly detrimentally affected her activity is daily living her quality of life and her mobility.  I feel at this point is essentially obtain an MRI of her left hip to really evaluate the cartilage given the minimal findings I'm seeing on plain film as well as assessing the severity of her pain. An MRI would help me better assess the anatomy of the pelvis to determine if hip replacement surgery is the right thing for and to determine how best position the implants given her scoliosis, her obesity and the tilt of her pelvis. All questions were encouraged and answered. I'll see her back once the MRI is obtained.

## 2016-12-27 ENCOUNTER — Other Ambulatory Visit: Payer: Self-pay | Admitting: Family Medicine

## 2016-12-27 DIAGNOSIS — F5101 Primary insomnia: Secondary | ICD-10-CM

## 2016-12-30 ENCOUNTER — Other Ambulatory Visit: Payer: Self-pay | Admitting: Family Medicine

## 2016-12-30 DIAGNOSIS — G894 Chronic pain syndrome: Secondary | ICD-10-CM

## 2016-12-31 ENCOUNTER — Ambulatory Visit (HOSPITAL_COMMUNITY): Admission: RE | Admit: 2016-12-31 | Payer: Medicaid Other | Source: Ambulatory Visit

## 2016-12-31 ENCOUNTER — Telehealth: Payer: Self-pay | Admitting: Family Medicine

## 2016-12-31 ENCOUNTER — Telehealth (INDEPENDENT_AMBULATORY_CARE_PROVIDER_SITE_OTHER): Payer: Self-pay | Admitting: *Deleted

## 2016-12-31 NOTE — Telephone Encounter (Signed)
Pt. Called requesting to speak with her PCP regarding her [employer] paperwork. Pt. States that medicaid needs a copy of all the paperwork. Please f/u with pt.

## 2016-12-31 NOTE — Telephone Encounter (Signed)
Pt called stating she had to cancel her appts today for MRI/CT d/t she has to be sedated to have these done and when she received call reminding her of her appts someone from the imaging place told her there was nothing noted in the orders for sedation pt says she told Dr. Louanne Skye about this. Dr. Louanne Skye is going to need to put in new orders for sedation.  Please advise!

## 2016-12-31 NOTE — Telephone Encounter (Signed)
[employer] paperwork was scanned into chart on  12/10/16 Scanned copy printed Please call patient and inquire about where it needs to go

## 2016-12-31 NOTE — Telephone Encounter (Signed)
Will route to PCP 

## 2017-01-01 NOTE — Telephone Encounter (Signed)
Pt was called and states paperwork needs to be faxed to medicare. Pt states she will bring the fax number on her OV 01/02/17.

## 2017-01-02 ENCOUNTER — Encounter: Payer: Self-pay | Admitting: Family Medicine

## 2017-01-02 ENCOUNTER — Ambulatory Visit: Payer: Medicaid Other | Attending: Family Medicine | Admitting: Family Medicine

## 2017-01-02 ENCOUNTER — Ambulatory Visit (INDEPENDENT_AMBULATORY_CARE_PROVIDER_SITE_OTHER): Payer: Medicaid Other | Admitting: Specialist

## 2017-01-02 ENCOUNTER — Encounter (INDEPENDENT_AMBULATORY_CARE_PROVIDER_SITE_OTHER): Payer: Self-pay

## 2017-01-02 VITALS — BP 159/96 | HR 88 | Temp 98.4°F | Wt 247.0 lb

## 2017-01-02 DIAGNOSIS — F5101 Primary insomnia: Secondary | ICD-10-CM | POA: Diagnosis not present

## 2017-01-02 DIAGNOSIS — I10 Essential (primary) hypertension: Secondary | ICD-10-CM | POA: Diagnosis not present

## 2017-01-02 DIAGNOSIS — Z794 Long term (current) use of insulin: Secondary | ICD-10-CM | POA: Diagnosis not present

## 2017-01-02 DIAGNOSIS — Z79899 Other long term (current) drug therapy: Secondary | ICD-10-CM | POA: Diagnosis not present

## 2017-01-02 DIAGNOSIS — E1129 Type 2 diabetes mellitus with other diabetic kidney complication: Secondary | ICD-10-CM

## 2017-01-02 DIAGNOSIS — G4700 Insomnia, unspecified: Secondary | ICD-10-CM | POA: Insufficient documentation

## 2017-01-02 DIAGNOSIS — IMO0002 Reserved for concepts with insufficient information to code with codable children: Secondary | ICD-10-CM

## 2017-01-02 DIAGNOSIS — Z23 Encounter for immunization: Secondary | ICD-10-CM | POA: Diagnosis not present

## 2017-01-02 DIAGNOSIS — R51 Headache: Secondary | ICD-10-CM | POA: Insufficient documentation

## 2017-01-02 DIAGNOSIS — F329 Major depressive disorder, single episode, unspecified: Secondary | ICD-10-CM | POA: Insufficient documentation

## 2017-01-02 DIAGNOSIS — M419 Scoliosis, unspecified: Secondary | ICD-10-CM | POA: Diagnosis not present

## 2017-01-02 DIAGNOSIS — E1165 Type 2 diabetes mellitus with hyperglycemia: Secondary | ICD-10-CM | POA: Insufficient documentation

## 2017-01-02 DIAGNOSIS — G894 Chronic pain syndrome: Secondary | ICD-10-CM | POA: Diagnosis not present

## 2017-01-02 DIAGNOSIS — R519 Headache, unspecified: Secondary | ICD-10-CM

## 2017-01-02 LAB — GLUCOSE, POCT (MANUAL RESULT ENTRY): POC GLUCOSE: 221 mg/dL — AB (ref 70–99)

## 2017-01-02 LAB — POCT GLYCOSYLATED HEMOGLOBIN (HGB A1C): HEMOGLOBIN A1C: 9.7

## 2017-01-02 MED ORDER — AMITRIPTYLINE HCL 100 MG PO TABS: 100.0000 mg | ORAL_TABLET | Freq: Every day | ORAL | 5 refills | Status: DC

## 2017-01-02 MED ORDER — SUMATRIPTAN SUCCINATE 25 MG PO TABS: 25.0000 mg | ORAL_TABLET | ORAL | 0 refills | Status: DC | PRN

## 2017-01-02 MED ORDER — LISINOPRIL 40 MG PO TABS: 40.0000 mg | ORAL_TABLET | Freq: Every day | ORAL | 2 refills | Status: DC

## 2017-01-02 MED ORDER — INSULIN PEN NEEDLE 31G X 8 MM MISC: 1.0000 "application " | Freq: Every day | 5 refills | Status: AC

## 2017-01-02 MED ORDER — EXENATIDE 5 MCG/0.02ML ~~LOC~~ SOPN: 5.0000 ug | PEN_INJECTOR | Freq: Two times a day (BID) | SUBCUTANEOUS | 2 refills | Status: DC

## 2017-01-02 MED ORDER — TEMAZEPAM 15 MG PO CAPS: 15.0000 mg | ORAL_CAPSULE | Freq: Every day | ORAL | 1 refills | Status: DC

## 2017-01-02 MED ORDER — ONDANSETRON 4 MG PO TBDP
4.0000 mg | ORAL_TABLET | Freq: Once | ORAL | Status: AC
  Administered 2017-01-02: 4 mg via ORAL

## 2017-01-02 NOTE — Progress Notes (Signed)
LOGO@  Subjective:  Patient ID: Briana Williams, female    DOB: 1981-01-12  Age: 36 y.o. MRN: [id number]  CC: Hypertension and Diabetes   HPI [name] has scoliosis, HTN, insomnia, DM2 uncontrolled, depression she presents for   1. CHRONIC DIABETES  Disease Monitoring  Blood Sugar Ranges:   Fasting: 130  Postprandial: 230 still highest  Polyuria: no   Visual problems: no   Medication Compliance: yes, taking 20 U of lantus, 27 U of novolog with meals based on sliding scale (total 81 U in 24 hrs). Metformin 500 mg XR BID Medication Side Effects  Hypoglycemia: no   Preventitive Health Care  Eye Exam: due   Foot Exam: done today    She sleeps 2-3 hrs per night. She takes [place]  Physical activity is limited due to scoliosis.  Tight and throbbing. spots   Cut down on bread carbs. No sugar sweetened drinks.   2.  Recurrent headache: started about 2 months. Throbbing in temples. With bright spots in vision. No vision loss. No head trauma. No associated nausea or emesis. Pains are severe when the some. Relieved with resting in a dark room. She does have short history of headaches at a teen/early 20s.    Social History  Substance Use Topics  . Smoking status: Never Smoker  . Smokeless tobacco: Never Used  . Alcohol use No   Current Outpatient Prescriptions on File Prior to Visit  Medication Sig Dispense Refill  . ACCU-CHEK SOFTCLIX LANCETS lancets 1 each by Other route 3 (three) times daily. ICD 10 E11.9 100 each 12  . albuterol (PROVENTIL HFA;VENTOLIN HFA) 108 (90 Base) MCG/ACT inhaler Inhale 1 puff into the lungs every 6 (six) hours as needed for wheezing or shortness of breath. 8 g 3  . Blood Glucose Monitoring Suppl (ACCU-CHEK AVIVA PLUS) w/Device KIT 1 Device by Does not apply route 3 (three) times daily after meals. ICD 10 E11.9 1 kit 0  . diclofenac (VOLTAREN) 75 MG EC tablet Take 1 tablet (75 mg total) by mouth 2 (two) times daily. 60 tablet 3  .  gabapentin (NEURONTIN) 100 MG capsule Take 3 capsules (300 mg total) by mouth 3 (three) times daily. 90 capsule 3  . glucose blood (ACCU-CHEK AVIVA PLUS) test strip 1 each by Other route 3 (three) times daily. ICD 10 E11.9 100 each 12  . insulin aspart (NOVOLOG) 100 UNIT/ML injection 12-20 per sliding scale 1 vial 5  . insulin glargine (LANTUS) 100 UNIT/ML injection Inject 0.3 mLs (30 Units total) into the skin at bedtime. 10 mL 3  . Lancet Devices (ACCU-CHEK SOFTCLIX) lancets 1 each by Other route 3 (three) times daily. ICD 10 E11.9 1 each 0  . metFORMIN (GLUCOPHAGE-XR) 500 MG 24 hr tablet Take 1 tablet (500 mg total) by mouth 2 (two) times daily. 60 tablet 2  . oxyCODONE (OXYCONTIN) 10 mg 12 hr tablet Take 10 mg by mouth 3 (three) times daily.    Marland Kitchen tiZANidine (ZANAFLEX) 4 MG tablet TAKE 1 TABLET(4 MG) BY MOUTH EVERY 8 HOURS AS NEEDED FOR MUSCLE SPASMS 90 tablet 2   No current facility-administered medications on file prior to visit.     ROS Review of Systems  Constitutional: Negative for chills and fever.  HENT: Negative for congestion and sinus pressure.   Eyes: Negative for visual disturbance.  Respiratory: Negative for cough and shortness of breath.   Cardiovascular: Negative for chest pain.  Gastrointestinal: Negative for abdominal pain and blood in stool.  Genitourinary: Positive for menstrual problem.  Musculoskeletal: Positive for arthralgias, back pain and gait problem.  Skin: Positive for rash.  Allergic/Immunologic: Negative for immunocompromised state.  Neurological: Positive for headaches.  Hematological: Negative for adenopathy. Does not bruise/bleed easily.  Psychiatric/Behavioral: Positive for sleep disturbance. Negative for dysphoric mood and suicidal ideas.    Objective:   Today's Vitals: BP (!) 159/96   Pulse 88   Temp 98.4 F (36.9 C) (Oral)   Wt 247 lb (112 kg)   SpO2 98%   BMI 54.40 kg/m   Wt Readings from Last 3 Encounters:  01/02/17 247 lb (112 kg)    12/10/16 230 lb (104.3 kg)  08/22/16 240 lb 12.8 oz (109.2 kg)    BP Readings from Last 3 Encounters:  01/02/17 (!) 159/96  12/10/16 (!) 162/89  08/22/16 137/84   Physical Exam  Constitutional: She is oriented to person, place, and time. She appears well-developed and well-nourished. No distress.  Obese   HENT:  Head: Normocephalic and atraumatic.  Cardiovascular: Normal rate, regular rhythm, normal heart sounds and intact distal pulses.   Pulmonary/Chest: Effort normal and breath sounds normal.  Musculoskeletal: She exhibits no edema.  Neurological: She is alert and oriented to person, place, and time.  Skin: Skin is warm and dry. No rash noted.     Psychiatric: She has a normal mood and affect.   Lab Results  Component Value Date   HGBA1C 9.8 08/22/2016   Lab Results  Component Value Date   HGBA1C 9.7 01/02/2017    CBG 221  Depression screen [facility] 2/9 01/02/2017 08/22/2016 07/15/2016  Decreased Interest _0 Down, Depressed, Hopeless _1 PHQ - 2 Score _2 Altered sleeping _3 Tired, decreased energy _4 Change in appetite _5 Feeling bad or failure about yourself  _6 Trouble concentrating _7 Moving slowly or fidgety/restless 1 1 0  Suicidal thoughts 1 1 0  PHQ-9 Score _8 GAD 7 : Generalized Anxiety Score 01/02/2017 08/22/2016 07/15/2016 05/30/2016  Nervous, Anxious, on Edge _9 Control/stop worrying _10 Worry too much - different things _11 Trouble relaxing _12 Restless _13 Easily annoyed or irritable _14 Afraid - awful might happen 2 0 0 1  Total GAD 7 Score _15 Assessment & Plan:   Problem List Items Addressed This Visit      High   Insomnia (Chronic)   Relevant Medications   temazepam (RESTORIL) 15 MG capsule   amitriptyline (ELAVIL) 100 MG tablet   Essential hypertension, benign (Chronic)   Relevant Medications   lisinopril (PRINIVIL,ZESTRIL) 40 MG tablet   Diabetes type 2,  uncontrolled (HCC) - Primary (Chronic)   Relevant Medications   lisinopril (PRINIVIL,ZESTRIL) 40 MG tablet   exenatide (BYETTA 5 MCG PEN) 5 MCG/0.02ML SOPN injection   Insulin Pen Needle (B-D ULTRAFINE III SHORT PEN) 31G X 8 MM MISC   Other Relevant Orders   POCT glycosylated hemoglobin (Hb A1C) (Completed)   Glucose (CBG) (Completed)   Chronic pain syndrome (Chronic)   Relevant Medications   amitriptyline (ELAVIL) 100 MG tablet    Other Visit Diagnoses    Recurrent headache       Relevant Medications   ondansetron (ZOFRAN-ODT) disintegrating tablet  4 mg (Completed)   amitriptyline (ELAVIL) 100 MG tablet   SUMAtriptan (IMITREX) 25 MG tablet      Outpatient Encounter Prescriptions as of 01/02/2017  Medication Sig  . ACCU-CHEK SOFTCLIX LANCETS lancets 1 each by Other route 3 (three) times daily. ICD 10 E11.9  . albuterol (PROVENTIL HFA;VENTOLIN HFA) 108 (90 Base) MCG/ACT inhaler Inhale 1 puff into the lungs every 6 (six) hours as needed for wheezing or shortness of breath.  . ALPRAZolam (XANAX) 0.5 MG tablet TAKE 1 TABLET BY MOUTH TWICE DAILY AS NEEDED FOR ANXIETY  . amitriptyline (ELAVIL) 50 MG tablet TAKE 1 TABLET(50 MG) BY MOUTH AT BEDTIME  . amoxicillin (AMOXIL) 875 MG tablet Take 1 tablet (875 mg total) by mouth 2 (two) times daily.  . Blood Glucose Monitoring Suppl (ACCU-CHEK AVIVA PLUS) w/Device KIT 1 Device by Does not apply route 3 (three) times daily after meals. ICD 10 E11.9  . diclofenac (VOLTAREN) 75 MG EC tablet Take 1 tablet (75 mg total) by mouth 2 (two) times daily.  Marland Kitchen gabapentin (NEURONTIN) 100 MG capsule Take 3 capsules (300 mg total) by mouth 3 (three) times daily.  Marland Kitchen glucose blood (ACCU-CHEK AVIVA PLUS) test strip 1 each by Other route 3 (three) times daily. ICD 10 E11.9  . insulin aspart (NOVOLOG) 100 UNIT/ML injection 12-20 per sliding scale  . insulin glargine (LANTUS) 100 UNIT/ML injection Inject 0.3 mLs (30 Units total) into the skin at bedtime.  Elmore Guise  Devices (ACCU-CHEK SOFTCLIX) lancets 1 each by Other route 3 (three) times daily. ICD 10 E11.9  . lisinopril (PRINIVIL,ZESTRIL) 30 MG tablet Take 1 tablet (30 mg total) by mouth daily.  . metFORMIN (GLUCOPHAGE-XR) 500 MG 24 hr tablet Take 1 tablet (500 mg total) by mouth 2 (two) times daily.  Marland Kitchen oxyCODONE (OXYCONTIN) 10 mg 12 hr tablet Take 10 mg by mouth 3 (three) times daily.  Marland Kitchen tiZANidine (ZANAFLEX) 4 MG tablet TAKE 1 TABLET(4 MG) BY MOUTH EVERY 8 HOURS AS NEEDED FOR MUSCLE SPASMS  . zolpidem (AMBIEN) 10 MG tablet Take 1 tablet (10 mg total) by mouth at bedtime as needed. for sleep  . [DISCONTINUED] zolpidem (AMBIEN) 10 MG tablet Take 1 tablet (10 mg total) by mouth at bedtime. for sleep   No facility-administered encounter medications on file as of 01/02/2017.     Follow-up: Return in about 6 weeks (around 02/13/2017) for headache, insomnia and diabetes.    [name] MD

## 2017-01-02 NOTE — Patient Instructions (Addendum)
Briana Williams was seen today for hypertension and diabetes.  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 -     Insulin Pen Needle (B-D ULTRAFINE III SHORT PEN) 31G X 8 MM MISC; 1 application by Does not apply route daily.  Essential hypertension, benign -     lisinopril (PRINIVIL,ZESTRIL) 40 MG tablet; Take 1 tablet (40 mg total) by mouth daily.  Recurrent headache -     ondansetron (ZOFRAN-ODT) disintegrating tablet 4 mg; Take 1 tablet (4 mg total) by mouth once. -     SUMAtriptan (IMITREX) 25 MG tablet; Take 1 tablet (25 mg total) by mouth every 2 (two) hours as needed for migraine. May repeat in 2 hours if headache persists or recurs.  Chronic pain syndrome -     amitriptyline (ELAVIL) 100 MG tablet; Take 1 tablet (100 mg total) by mouth at bedtime.  Primary insomnia -     temazepam (RESTORIL) 15 MG capsule; Take 1 capsule (15 mg total) by mouth at bedtime. -     amitriptyline (ELAVIL) 100 MG tablet; Take 1 tablet (100 mg total) by mouth at bedtime.    Stop ambien and xanax  Replace with restoril this is a long acting benzo in same class as xanax  Increase elavil to 100 mg nightly  Increase lisinopril to 40 mg   imitrex up to 2 doses per week for headaches  F/u in 6 weeks for diabetes, headache and insomnia   Dr. Adrian Blackwater

## 2017-01-03 ENCOUNTER — Encounter: Payer: Self-pay | Admitting: Pharmacist

## 2017-01-03 ENCOUNTER — Telehealth: Payer: Self-pay

## 2017-01-03 NOTE — Progress Notes (Signed)
Prior authorization completed for [place]  Pending approval.  [phone] W

## 2017-01-03 NOTE — Telephone Encounter (Signed)
Call placed to [facility] # [phone] to inquire about the status of the medicaid prior auth. Spoke to Luther who stated that they have received all necessary documentation from this office and it has been submitted to [facility] and they are just waiting for a determination.  He also noted that the patient called their office on 12/31/16 and inquired about the status of her order and they informed her that they are just waiting for a determination from medicaid.  Ryan noted that nothing else is needed from [facility] and [facility] does not need to submit any documentation to medicaid.

## 2017-01-06 ENCOUNTER — Encounter: Payer: Self-pay | Admitting: Family Medicine

## 2017-01-06 ENCOUNTER — Telehealth: Payer: Self-pay | Admitting: Family Medicine

## 2017-01-06 DIAGNOSIS — R519 Headache, unspecified: Secondary | ICD-10-CM | POA: Insufficient documentation

## 2017-01-06 DIAGNOSIS — R51 Headache: Secondary | ICD-10-CM

## 2017-01-06 NOTE — Telephone Encounter (Signed)
[facility] Discontinued ambien refills as she has been transitioned to Restoril.  Called patient's [facility]  Canceled refills of xanax, she has been transited to restoril

## 2017-01-06 NOTE — Assessment & Plan Note (Signed)
Uncontrolled diabetes in obese female with scoliosis affecting hips and back Plan: Continue current lantus, novolog and metformin Add victoza to help with blood sugar  control and weight management

## 2017-01-06 NOTE — Assessment & Plan Note (Signed)
Recurrent temple headaches b/l Consistent with tension She also has sleep disturbance and depression  Plan: Add elavil  zofran and imitrex prn headache episodes

## 2017-01-06 NOTE — Assessment & Plan Note (Signed)
A; uncontrolled with Ambien P: D/c ambien and xanax Replace with restoril

## 2017-01-07 NOTE — Telephone Encounter (Signed)
Patient concern

## 2017-01-08 ENCOUNTER — Encounter: Payer: Self-pay | Admitting: Family Medicine

## 2017-01-09 ENCOUNTER — Ambulatory Visit (INDEPENDENT_AMBULATORY_CARE_PROVIDER_SITE_OTHER): Payer: Self-pay | Admitting: Specialist

## 2017-01-13 ENCOUNTER — Ambulatory Visit (INDEPENDENT_AMBULATORY_CARE_PROVIDER_SITE_OTHER): Payer: Self-pay | Admitting: Specialist

## 2017-01-13 ENCOUNTER — Encounter: Payer: Self-pay | Admitting: Family Medicine

## 2017-01-14 ENCOUNTER — Ambulatory Visit
Admission: RE | Admit: 2017-01-14 | Discharge: 2017-01-14 | Disposition: A | Discharge status: Home / Self Care | Payer: Medicaid Other | Source: Ambulatory Visit | Attending: Orthopaedic Surgery | Admitting: Orthopaedic Surgery

## 2017-01-14 DIAGNOSIS — M25552 Pain in left hip: Secondary | ICD-10-CM

## 2017-01-15 ENCOUNTER — Ambulatory Visit (INDEPENDENT_AMBULATORY_CARE_PROVIDER_SITE_OTHER): Payer: Medicaid Other | Admitting: Orthopaedic Surgery

## 2017-01-15 ENCOUNTER — Other Ambulatory Visit (INDEPENDENT_AMBULATORY_CARE_PROVIDER_SITE_OTHER): Payer: Self-pay | Admitting: Specialist

## 2017-01-15 ENCOUNTER — Encounter (INDEPENDENT_AMBULATORY_CARE_PROVIDER_SITE_OTHER): Payer: Self-pay | Admitting: Orthopaedic Surgery

## 2017-01-15 ENCOUNTER — Telehealth (INDEPENDENT_AMBULATORY_CARE_PROVIDER_SITE_OTHER): Payer: Self-pay | Admitting: *Deleted

## 2017-01-15 VITALS — Ht <= 58 in | Wt 247.0 lb

## 2017-01-15 DIAGNOSIS — G8929 Other chronic pain: Secondary | ICD-10-CM

## 2017-01-15 DIAGNOSIS — M160 Bilateral primary osteoarthritis of hip: Secondary | ICD-10-CM | POA: Insufficient documentation

## 2017-01-15 DIAGNOSIS — M25552 Pain in left hip: Secondary | ICD-10-CM | POA: Diagnosis not present

## 2017-01-15 DIAGNOSIS — R29898 Other symptoms and signs involving the musculoskeletal system: Secondary | ICD-10-CM

## 2017-01-15 DIAGNOSIS — M1612 Unilateral primary osteoarthritis, left hip: Secondary | ICD-10-CM

## 2017-01-15 DIAGNOSIS — M25512 Pain in left shoulder: Secondary | ICD-10-CM

## 2017-01-15 DIAGNOSIS — M4712 Other spondylosis with myelopathy, cervical region: Secondary | ICD-10-CM

## 2017-01-15 DIAGNOSIS — M4015 Other secondary kyphosis, thoracolumbar region: Secondary | ICD-10-CM

## 2017-01-15 NOTE — Telephone Encounter (Signed)
Pt stated she called on 6/5 about sedation for MRI. She has not heard anything back.

## 2017-01-15 NOTE — Telephone Encounter (Signed)
Pt. Came to facility stating that her PCP had prescribed her insulin las week. Pt. Went to [facility] and was told that they needed prior authorization. Please f/u

## 2017-01-15 NOTE — Telephone Encounter (Signed)
MRIs of the thoracic and left shoulder to be done at [facility] under GA ordered. jen

## 2017-01-15 NOTE — Telephone Encounter (Signed)
Please put in new MRI orders for MRI with sedation ---needs to be done at [facility]

## 2017-01-15 NOTE — Telephone Encounter (Signed)
Orders for MRI of her thoracic spine and left shoulder to be done at [facility] under a general anesthetic ordered. jen

## 2017-01-15 NOTE — Progress Notes (Signed)
Office Visit Note   Patient: Briana Williams           Date of Birth: [birth year]           MRN: [id number] Visit Date: 01/15/2017              Requested by: [name], MD [address] [place], [place] [address] PCP: [name], MD   Assessment & Plan: Visit Diagnoses:  1. Pain of left hip joint   2. Unilateral primary osteoarthritis, left hip     Plan: At this point given the severity of her pain and the MRI findings showing the extent of her left hip arthritis she would like to proceed with a total hip arthroplasty. I had a long and thorough discussion about the risk and benefits of the surgery with her and I showed her hip model and spineboard her intraoperative and postoperative course would be. Her hemoglobin A1c is 8.1 I was able to get a good assessment of what she would be like to do with from a surgical standpoint in terms of her spine anatomy and the way she walks and how we may need to be sent significantly meticulous on how the implants go into doesn't affect the lerch of her gait. All questions were encouraged and answered and she does wish to proceed with the surgery in the near future.  Follow-Up Instructions: Return for 2 weeks post-op.   Orders:  No orders of the defined types were placed in this encounter.  No orders of the defined types were placed in this encounter.     Procedures: No procedures performed   Clinical Data: No additional findings.   Subjective: Chief Complaint  Patient presents with  . Left Hip - Follow-up    MRI review  The patient is here today follow-up of MRI of her left hip. She has severe debilitating left hip pain has had multiple back surgeries as well. She walks with significant limp and has had intra-articular injections and long conservative treatment lasting greater than a year. Her pain is daily and is detrimentally affected activities daily living, her quality of life, and her mobility. Her x-ray showed mild  to moderate arthritic changes in her hip but elevated really get an MRI to confirm the disease in her hip and she's here for review this today.  HPI  Review of Systems She is alert and 3 with no acute distress. She denies any headache, chest pain, short of breath, fever, chills, nausea, vomiting.  Objective: Vital Signs: Ht 4\' 8"  (1.422 m)   Wt 247 lb (112 kg)   BMI 55.38 kg/m   Physical Exam She is a moderately morbidly obese individual Ortho Exam Examination of her left hip shows severe pain with internal/external rotation. I had her lay supine on the examining table in her leg lengths are equal. I was able to mobilize her abdomen to get a good idea of where soft tissue plane would be for surgical intervention. Specialty Comments:  No specialty comments available.  Imaging: Mr Hip Left W/o Contrast  Result Date: 01/15/2017 CLINICAL DATA:  Left hip pain for 36 years. Multiple back surgeries. No hip surgery. Increased pain for 6-12 months. EXAM: MR OF THE LEFT HIP WITHOUT CONTRAST TECHNIQUE: Multiplanar, multisequence MR imaging was performed. No intravenous contrast was administered. COMPARISON:  None. FINDINGS: Bones: No hip fracture, dislocation or avascular necrosis. No periosteal reaction or bone destruction. No aggressive osseous lesion. Sacrum and sacroiliac joints are not fully  included in the field of view. Partially visualize is degenerative disc disease with disc height loss and facet arthropathy of the lower lumbar spine. Levoscoliosis of the lumbar spine. Articular cartilage and labrum Articular cartilage: High-grade partial-thickness cartilage loss of the left femoral head and acetabulum with marginal osteophytes. Mild partial-thickness cartilage loss of the left hip. Labrum: Grossly intact, but evaluation is limited by lack of intraarticular fluid. Joint or bursal effusion Joint effusion:  No hip joint effusion.  No SI joint effusion. Bursae:  No bursa formation. Muscles and  tendons Flexors: Normal. Extensors: Normal. Abductors: Normal. Adductors: Normal. Gluteals: Generalized mild gluteal muscle atrophy. Hamstrings: Normal. Other findings Miscellaneous: No pelvic free fluid. No fluid collection or hematoma. No inguinal lymphadenopathy. No inguinal hernia. 3.2 x 2.5 cm T2 hyperintense left adnexal mass with mild T1 hyperintensity with chain on both T1 and T2 weighted images likely reflecting a left ovarian hemorrhagic cyst or endometrioma. IMPRESSION: 1. Moderate osteoarthritis of the left hip. 2. Mild osteoarthritis of the right hip. 3. Left ovarian mass which may reflect a hemorrhagic cyst versus endometrioma. Follow-up pelvic ultrasound in 6-8 weeks is recommended. 4. Partially visualize is lower lumbar spine spondylosis. Electronically Signed   By: Kathreen Devoid   On: 01/15/2017 08:55     PMFS History: Patient Active Problem List   Diagnosis Date Noted  . Pain of left hip joint 01/15/2017  . Unilateral primary osteoarthritis, left hip 01/15/2017  . Recurrent headache 01/06/2017  . Bilateral hip pain 12/25/2016  . Pruritus 08/23/2016  . Irregular menstrual cycle 05/30/2016  . Chronic pain syndrome 09/21/2015  . HYPERTRIGLYCERIDEMIA 05/02/2009  . 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 Insomnia 05/01/2009  . SCOLIOSIS 05/18/2008  . Diabetes type 2, uncontrolled ([place]) 04/18/2008  . DEPRESSION 04/18/2008  . Essential hypertension, benign 04/18/2008   Past Medical History:  Diagnosis Date  . Hyperlipidemia   . Hypertension associated with diabetes ([place])   . Renal disorder   . Scoliosis   . Type 2 diabetes mellitus treated with insulin (HCC)     Family History  Problem Relation Age of Onset  . Cancer Maternal Grandmother        mandibular cancer   . Diabetes Mother   . Heart failure Mother   . CAD Mother        triple CABG  . Diabetes Maternal Aunt   . Stroke Father     Past Surgical History:  Procedure Laterality Date  . BACK SURGERY    . CERVICAL FUSION     . frontal closed sutures to forehead    . Harrington Rod placement    . HIP SURGERY     staph infection  . LUMBAR FUSION     Social History   Occupational History  .  Unemployed   Social History Main Topics  . Smoking status: Never Smoker  . Smokeless tobacco: Never Used  . Alcohol use No  . Drug use: No  . Sexual activity: Yes    Birth control/ protection: Other-see comments     Comment: unable to have children d/t scoliosis

## 2017-01-15 NOTE — Telephone Encounter (Signed)
Patient referring to [place]. Prior auth submitted to [employer] for [facility] Medicaid  Approval pending - confirmation [phone]

## 2017-01-20 NOTE — Telephone Encounter (Signed)
Patient response for Dr. Adrian Blackwater

## 2017-01-21 DIAGNOSIS — M4120 Other idiopathic scoliosis, site unspecified: Secondary | ICD-10-CM | POA: Diagnosis not present

## 2017-01-24 ENCOUNTER — Other Ambulatory Visit: Payer: Self-pay | Admitting: Family Medicine

## 2017-01-24 DIAGNOSIS — G894 Chronic pain syndrome: Secondary | ICD-10-CM

## 2017-01-30 ENCOUNTER — Other Ambulatory Visit (INDEPENDENT_AMBULATORY_CARE_PROVIDER_SITE_OTHER): Payer: Self-pay | Admitting: Specialist

## 2017-01-30 NOTE — Telephone Encounter (Signed)
Diclofenac refill request 

## 2017-02-03 ENCOUNTER — Encounter: Payer: Self-pay | Admitting: Family Medicine

## 2017-02-03 NOTE — Telephone Encounter (Signed)
Patient concern

## 2017-02-04 MED ORDER — PANTOPRAZOLE SODIUM 40 MG PO TBEC: 40.0000 mg | DELAYED_RELEASE_TABLET | Freq: Every day | ORAL | 0 refills | Status: DC

## 2017-02-06 ENCOUNTER — Other Ambulatory Visit (INDEPENDENT_AMBULATORY_CARE_PROVIDER_SITE_OTHER): Payer: Self-pay | Admitting: Orthopaedic Surgery

## 2017-02-09 ENCOUNTER — Other Ambulatory Visit: Payer: Self-pay | Admitting: Family Medicine

## 2017-02-09 DIAGNOSIS — G894 Chronic pain syndrome: Secondary | ICD-10-CM

## 2017-02-10 ENCOUNTER — Encounter (HOSPITAL_COMMUNITY): Payer: Self-pay | Admitting: *Deleted

## 2017-02-10 ENCOUNTER — Other Ambulatory Visit: Payer: Self-pay

## 2017-02-10 ENCOUNTER — Encounter (HOSPITAL_COMMUNITY)
Admission: RE | Admit: 2017-02-10 | Discharge: 2017-02-10 | Disposition: A | Discharge status: Home / Self Care | Payer: Medicaid Other | Source: Ambulatory Visit | Attending: Orthopaedic Surgery | Admitting: Orthopaedic Surgery

## 2017-02-10 DIAGNOSIS — E119 Type 2 diabetes mellitus without complications: Secondary | ICD-10-CM | POA: Diagnosis not present

## 2017-02-10 DIAGNOSIS — M1990 Unspecified osteoarthritis, unspecified site: Secondary | ICD-10-CM | POA: Insufficient documentation

## 2017-02-10 DIAGNOSIS — Z01812 Encounter for preprocedural laboratory examination: Secondary | ICD-10-CM | POA: Insufficient documentation

## 2017-02-10 DIAGNOSIS — E785 Hyperlipidemia, unspecified: Secondary | ICD-10-CM | POA: Diagnosis not present

## 2017-02-10 DIAGNOSIS — R9431 Abnormal electrocardiogram [ECG] [EKG]: Secondary | ICD-10-CM | POA: Insufficient documentation

## 2017-02-10 DIAGNOSIS — Z981 Arthrodesis status: Secondary | ICD-10-CM | POA: Diagnosis not present

## 2017-02-10 DIAGNOSIS — I10 Essential (primary) hypertension: Secondary | ICD-10-CM | POA: Diagnosis not present

## 2017-02-10 DIAGNOSIS — K219 Gastro-esophageal reflux disease without esophagitis: Secondary | ICD-10-CM | POA: Insufficient documentation

## 2017-02-10 DIAGNOSIS — Z01818 Encounter for other preprocedural examination: Secondary | ICD-10-CM | POA: Insufficient documentation

## 2017-02-10 DIAGNOSIS — Z794 Long term (current) use of insulin: Secondary | ICD-10-CM | POA: Diagnosis not present

## 2017-02-10 DIAGNOSIS — Z79899 Other long term (current) drug therapy: Secondary | ICD-10-CM | POA: Diagnosis not present

## 2017-02-10 HISTORY — DX: Gastro-esophageal reflux disease without esophagitis: K21.9

## 2017-02-10 HISTORY — DX: Headache, unspecified: R51.9

## 2017-02-10 HISTORY — DX: Sleep apnea, unspecified: G47.30

## 2017-02-10 HISTORY — DX: Headache: R51

## 2017-02-10 HISTORY — DX: Unspecified osteoarthritis, unspecified site: M19.90

## 2017-02-10 LAB — GLUCOSE, CAPILLARY: GLUCOSE-CAPILLARY: 262 mg/dL — AB (ref 65–99)

## 2017-02-10 LAB — BASIC METABOLIC PANEL
Anion gap: 9 (ref 5–15)
BUN: 14 mg/dL (ref 6–20)
CHLORIDE: 103 mmol/L (ref 101–111)
CO2: 25 mmol/L (ref 22–32)
CREATININE: 0.69 mg/dL (ref 0.44–1.00)
Calcium: 9.7 mg/dL (ref 8.9–10.3)
GFR calc Af Amer: >60 mL/min (ref >60)
GFR calc non Af Amer: >60 mL/min (ref >60)
Glucose, Bld: 235 mg/dL — ABNORMAL HIGH (ref 65–99)
Potassium: 4.2 mmol/L (ref 3.5–5.1)
Sodium: 137 mmol/L (ref 135–145)

## 2017-02-10 LAB — CBC
HEMATOCRIT: 37.1 % (ref 36.0–46.0)
HEMOGLOBIN: 12.2 g/dL (ref 12.0–15.0)
MCH: 28.5 pg (ref 26.0–34.0)
MCHC: 32.9 g/dL (ref 30.0–36.0)
MCV: 86.7 fL (ref 78.0–100.0)
Platelets: 215 10*3/uL (ref 150–400)
RBC: 4.28 MIL/uL (ref 3.87–5.11)
RDW: 13.7 % (ref 11.5–15.5)
WBC: 11.2 10*3/uL — ABNORMAL HIGH (ref 4.0–10.5)

## 2017-02-10 LAB — SURGICAL PCR SCREEN
MRSA, PCR: NEGATIVE
Staphylococcus aureus: NEGATIVE

## 2017-02-10 NOTE — Pre-Procedure Instructions (Addendum)
Briana Williams  02/10/2017      [id number] [place], [place] [address] [place] [address] Phone: [phone] Fax: [phone]  [facility] [place], [place] [address] Phone: [phone] Fax: [phone]  [place], [place] - [address] [facility]. [address] [place] [address] Phone: [phone] Fax: [phone]    Your procedure is scheduled on Tuesday, July 24th   Report to [facility] Admitting at 10:00 am   Call this number if you have problems the morning of surgery:  [phone], for other questions call [phone] Mon-Fri from 8a-4p   Remember:   Do not eat food or drink liquids after midnight Monday.              4-5 days prior to surgery, STOP TAKING any Vitamins, Herbal Supplements, Anti-inflammatories.   Take these medicines the morning of surgery with A SIP OF WATER : Gabapentin, Oxycodone, Pantoprazole.  Please use your inhaler.   Do not wear jewelry, make-up or nail polish.  Do not wear lotions, powders,  perfumes, or deoderant.  Do not shave 48 hours prior to surgery.     Do not bring valuables to the hospital.  [facility] is not responsible for any belongings or valuables.   Contacts, dentures or bridgework may not be worn into surgery.  Leave your suitcase in the car.  After surgery it may be brought to your room. For patients admitted to the hospital, discharge time will be determined by your treatment team.   Please read over the following fact sheets that you were given. Pain Booklet, MRSA Information and Surgical Site Infection Prevention       How to Manage Your Diabetes Before and After Surgery  Why is it important to control my blood sugar before and after  surgery? . Improving blood sugar levels before and after surgery helps healing and can limit problems. . A way of improving blood sugar control is eating a healthy diet by: o  Eating less sugar and carbohydrates o  Increasing activity/exercise o  Talking with your doctor about reaching your blood sugar goals . High blood sugars (greater than 180 mg/dL) can raise your risk of infections and slow your recovery, so you will need to focus on controlling your diabetes during the weeks before surgery. . Make sure that the doctor who takes care of your diabetes knows about your planned surgery including the date and location.  How do I manage my blood sugar before surgery? . Check your blood sugar at least 4 times a day, starting 2 days before surgery, to make sure that the level is not too high or low. o Check your blood sugar the morning of your surgery when you wake up and every 2 hours until you get to the Short Stay unit. o  . If your blood sugar is less than 70 mg/dL, you will need to treat for low blood sugar: o Do not take insulin. o Treat a low blood sugar (less than 70 mg/dL) with  cup of clear juice (cranberry or apple), 4 glucose tablets, OR glucose gel. o  o Recheck blood sugar in 15 minutes after treatment (to make sure it is greater than 70 mg/dL). If your blood sugar is not  greater than 70 mg/dL on recheck, call [phone] for further instructions. . Report your blood sugar to the short stay nurse when you get to Short Stay.  . If you are admitted to the hospital after surgery: o Your blood sugar will be checked by the staff and you will probably be given insulin after surgery (instead of oral diabetes medicines) to make sure you have good blood sugar levels. o The goal for blood sugar control after surgery is 80-180 mg/dL.  WHAT DO I DO ABOUT MY DIABETES MEDICATION?   Marland Kitchen Do not take oral diabetes medicines (pills) the morning of surgery.    . The day of surgery, do not take  other diabetes injectables, including Byetta (exenatide), Bydureon (exenatide ER), Victoza (liraglutide), or Trulicity (dulaglutide).  . If your CBG is greater than 220 mg/dL, you may take  of your sliding scale (correction) dose of insulin.  Other Instructions:          Patient Signature:  Date:   Nurse Signature:  Date:   Reviewed and Endorsed by [facility] Patient Education Committee, August 2015

## 2017-02-10 NOTE — Progress Notes (Addendum)
Denies any cardiac issues.  Denies murmur or stroke. PCP is Dr. Adrian Blackwater at [place] 01/2017 She checks her blood sugar BID-  Usually runs in the 160 range.  Uses Byetta & metformin.  A1C was 9.7 12/2016.  Accu check done at PAT was 262 No PAT orders.

## 2017-02-11 ENCOUNTER — Encounter (HOSPITAL_COMMUNITY): Payer: Self-pay | Admitting: Vascular Surgery

## 2017-02-11 NOTE — Progress Notes (Signed)
Anesthesia Chart Review: Patient is a 36 year old female scheduled for left anterior THA on 02/18/17 by Dr. Jean Rosenthal. (She also has upcoming MRIs of the T-spine and left shoulder under anesthesia scheduled for 02/25/17 at [facility].) Anesthesia type is posted for [facility].  History includes never smoker, DM2, HLD, HTN, GERD, migraines, scoliosis s/p Harrington Rod placement (removal T2-L4 hardware 02/01/00), arthritis, right ovarian cystectomy '07, C3-6 ACDF 09/23/08, C3-7 bilateral laminectomy and C2-3 fusion 09/02/12 [facility]), "frontal closed sutures to forehead" (craniosynostosis ?; s/p surgery at 6 months). Renal disorder is listed, but her kidneys were normal in appearance on 01/12/11 CT scan and labs from 02/20/17 showed no evidence of renal insuffiency. BMI is consistent with morbid obesity. She has a pending sleep study to evaluate for OSA (ordered 10/08/16, but postponed due to "Strep throat"). She reports 43 surgeries so far.  PCP is Dr. [name] with [facility]. Last visit 01/02/17. Victoza was added for uncontrolled DM with A1c 9.7, but it appears that it was actually Byetta that was started. Lanuts, Novolog, and metformin continued, patient said to her understanding Byetta took the place of her Novolog SSI and Lantus, so she has not been taking. Since on Byetta she has lost 21 lbs and says fasting CBGs ~ 120 and postprandial CBGs ~ 120-160's. She is scheduled to see Dr. Adrian Blackwater on 02/17/17.  Meds include albuterol, amitriptyline, Byetta 5 mcg BID, gabapentin, NovoLog SSI (not taking), Lantus 20 Units Q Q AM (not taking), lisinopril, metformin, Oxycontin, Protonix, Imitrex, temazepam, Zanaflex.  BP (!) 177/94   Pulse 87   Temp 37 C   Resp 20   Ht 4' 8.5" (1.435 m)   Wt 226 lb 9.6 oz (102.8 kg)   LMP 02/10/2017 (Exact Date) Comment: has scoliosis  SpO2 100%   BMI 49.91 kg/m  It does not appear that staff recheck BP at PAT. BP was 159/96 at her 01/02/17  visit with Dr. Adrian Blackwater.  EKG 02/10/17: NSR, inferior infarct (age undetermined). EKG appears stable when compared to 08/26/08 and 7/-6/01 tracings in [facility]. She denied chest pain, SOB.   C-spine AP lateral/flex/ext 08/11/15 [facility]; Care Everywhere): IMPRESSION: - Levoconvex scoliosis of the cervicothoracic spine status post anterior fixation from C3-C6 and posterior fixation from C2-C3, with osseous fusion from C3-C6.  CTA neck 09/02/12 [facility]; Care Everywhere): Impression:  Extensive postsurgical changes in the cervical spine, as above. Screw position of the posterior fixation hardware, as above.  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  PFTs 09/28/14 [facility]; Care Everywhere): FVC 2.7 (93.2 %), FEV1 2.37 (96.8%), DLCOunc 21.90 (102.3%).   Preoperative labs noted. Cr 0.69, WBC 11.2, H/H 12.2/37.1, PLT 215. Non-fasting glucose 235. A1c on 01/02/17 was 9.7 (previously 9.8 on 08/22/16).   I have sent a staff message to Dr. Adrian Blackwater to please clarify with patient if she should resume her Novolog SSI and/or Lantus. I also left a message with Sherrie at Dr. Trevor Mace regarding elevated A1c and patient's reported home CBG readings. Patient notified that if she arrives with fasting CBG > 200 then her surgery could be delayed or cancelled, but would defer to Dr. Ninfa Linden if he wanted surgery postponed to allow more time to get A1c lower.    George Hugh [facility] Short Stay Center/Anesthesiology Phone [phone] 02/11/2017 4:16 PM

## 2017-02-12 ENCOUNTER — Other Ambulatory Visit (INDEPENDENT_AMBULATORY_CARE_PROVIDER_SITE_OTHER): Payer: Self-pay | Admitting: Physician Assistant

## 2017-02-14 ENCOUNTER — Ambulatory Visit (INDEPENDENT_AMBULATORY_CARE_PROVIDER_SITE_OTHER): Payer: Medicaid Other | Admitting: Specialist

## 2017-02-17 ENCOUNTER — Ambulatory Visit: Payer: Medicaid Other | Attending: Family Medicine | Admitting: Family Medicine

## 2017-02-17 ENCOUNTER — Other Ambulatory Visit: Payer: Self-pay | Admitting: Pharmacist

## 2017-02-17 ENCOUNTER — Encounter: Payer: Self-pay | Admitting: Family Medicine

## 2017-02-17 VITALS — BP 136/90 | HR 88 | Temp 97.7°F | Ht <= 58 in | Wt 230.4 lb

## 2017-02-17 DIAGNOSIS — E1129 Type 2 diabetes mellitus with other diabetic kidney complication: Secondary | ICD-10-CM

## 2017-02-17 DIAGNOSIS — F329 Major depressive disorder, single episode, unspecified: Secondary | ICD-10-CM | POA: Insufficient documentation

## 2017-02-17 DIAGNOSIS — G894 Chronic pain syndrome: Secondary | ICD-10-CM | POA: Insufficient documentation

## 2017-02-17 DIAGNOSIS — M419 Scoliosis, unspecified: Secondary | ICD-10-CM | POA: Insufficient documentation

## 2017-02-17 DIAGNOSIS — I10 Essential (primary) hypertension: Secondary | ICD-10-CM | POA: Insufficient documentation

## 2017-02-17 DIAGNOSIS — Z79899 Other long term (current) drug therapy: Secondary | ICD-10-CM | POA: Diagnosis not present

## 2017-02-17 DIAGNOSIS — IMO0002 Reserved for concepts with insufficient information to code with codable children: Secondary | ICD-10-CM

## 2017-02-17 DIAGNOSIS — R51 Headache: Secondary | ICD-10-CM | POA: Insufficient documentation

## 2017-02-17 DIAGNOSIS — Z794 Long term (current) use of insulin: Secondary | ICD-10-CM | POA: Diagnosis not present

## 2017-02-17 DIAGNOSIS — G4700 Insomnia, unspecified: Secondary | ICD-10-CM | POA: Diagnosis not present

## 2017-02-17 DIAGNOSIS — F5101 Primary insomnia: Secondary | ICD-10-CM | POA: Diagnosis not present

## 2017-02-17 DIAGNOSIS — R519 Headache, unspecified: Secondary | ICD-10-CM

## 2017-02-17 DIAGNOSIS — E1165 Type 2 diabetes mellitus with hyperglycemia: Secondary | ICD-10-CM | POA: Insufficient documentation

## 2017-02-17 LAB — GLUCOSE, POCT (MANUAL RESULT ENTRY): POC GLUCOSE: 110 mg/dL — AB (ref 70–99)

## 2017-02-17 LAB — POCT GLYCOSYLATED HEMOGLOBIN (HGB A1C): HEMOGLOBIN A1C: 9.2

## 2017-02-17 MED ORDER — TIZANIDINE HCL 4 MG PO TABS: 4.0000 mg | ORAL_TABLET | Freq: Three times a day (TID) | ORAL | 2 refills | Status: DC | PRN

## 2017-02-17 MED ORDER — INSULIN ASPART 100 UNIT/ML ~~LOC~~ SOLN: SUBCUTANEOUS | 5 refills | Status: DC

## 2017-02-17 MED ORDER — TEMAZEPAM 30 MG PO CAPS: 30.0000 mg | ORAL_CAPSULE | Freq: Every day | ORAL | 2 refills | Status: DC

## 2017-02-17 MED ORDER — SUMATRIPTAN SUCCINATE 25 MG PO TABS: 25.0000 mg | ORAL_TABLET | ORAL | 0 refills | Status: DC | PRN

## 2017-02-17 MED ORDER — INSULIN GLARGINE 100 UNIT/ML ~~LOC~~ SOLN: 30.0000 [IU] | Freq: Every day | SUBCUTANEOUS | 5 refills | Status: DC

## 2017-02-17 NOTE — Patient Instructions (Addendum)
[place] was seen today for headache.  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 -     insulin aspart (NOVOLOG) 100 UNIT/ML injection; 12-20 per sliding scale  Recurrent headache -     Ambulatory referral to Ophthalmology -     SUMAtriptan (IMITREX) 25 MG tablet; Take 1 tablet (25 mg total) by mouth every 2 (two) hours as needed for migraine. May repeat in 2 hours if headache persists or recurs.  Primary insomnia -     temazepam (RESTORIL) 30 MG capsule; Take 1 capsule (30 mg total) by mouth at bedtime.  Chronic pain syndrome -     tiZANidine (ZANAFLEX) 4 MG tablet; Take 1 tablet (4 mg total) by mouth every 8 (eight) hours as needed for muscle spasms. TAKE 1 TABLET(4 MG) BY MOUTH TWO TIMES DAILY   Diabetes blood sugar goals  Fasting (in AM before breakfast, 8 hrs of no eating or drinking (except water or unsweetened coffee or tea): 90-110 2 hrs after meals: < 160,   No low sugars: nothing < 70   F/u in 8 weeks for insomnia and diabetes  Dr. Adrian Blackwater

## 2017-02-17 NOTE — Progress Notes (Signed)
LOGO@  Subjective:  Patient ID: Briana Williams, female    DOB: 01/20/1981  Age: 36 y.o. MRN: [id number]  CC: Headache   HPI DOLOREZ JEFFREY has scoliosis, HTN, insomnia, DM2 uncontrolled, depression she presents for   1. CHRONIC DIABETES  Disease Monitoring  Blood Sugar Ranges:   Fasting: 110  Postprandial: 85- 180  Polyuria: no   Visual problems: no   Medication Compliance: yes, taking 30 U of lantus, 12  U of novolog with meals. Also taking byetta mad metformin,  Medication Side Effects  Hypoglycemia: no   Preventitive Health Care  Eye Exam: due   Foot Exam: done today    2.  Recurrent headache: started about 2 months. Throbbing in temples. With bright spots in vision. No vision loss. No head trauma. No associated nausea or emesis. Pains are severe when they come. Relieved with resting in a dark room. She does have short history of headaches at a teen/early 20s. Headaches have improved since last visit.   3. Insomnia: she take Restoril nightly. Her sleep has improved. She initially slept well for the first two weeks. She reports her sleep has declined over the last 3 days.   Social History  Substance Use Topics  . Smoking status: Never Smoker  . Smokeless tobacco: Never Used  . Alcohol use No   Current Outpatient Prescriptions on File Prior to Visit  Medication Sig Dispense Refill  . ACCU-CHEK SOFTCLIX LANCETS lancets 1 each by Other route 3 (three) times daily. ICD 10 E11.9 100 each 12  . albuterol (PROVENTIL HFA;VENTOLIN HFA) 108 (90 Base) MCG/ACT inhaler Inhale 1 puff into the lungs every 6 (six) hours as needed for wheezing or shortness of breath. 8 g 3  . amitriptyline (ELAVIL) 100 MG tablet Take 1 tablet (100 mg total) by mouth at bedtime. 30 tablet 5  . Blood Glucose Monitoring Suppl (ACCU-CHEK AVIVA PLUS) w/Device KIT 1 Device by Does not apply route 3 (three) times daily after meals. ICD 10 E11.9 1 kit 0  . diclofenac (VOLTAREN) 75 MG EC tablet TAKE 1  TABLET(75 MG) BY MOUTH TWICE DAILY (Patient not taking: Reported on 02/07/2017) 180 tablet 3  . exenatide (BYETTA 5 MCG PEN) 5 MCG/0.02ML SOPN injection Inject 0.02 mLs (5 mcg total) into the skin 2 (two) times daily with a meal. 1.2 mL 2  . gabapentin (NEURONTIN) 100 MG capsule TAKE 3 CAPSULES(300 MG) BY MOUTH THREE TIMES DAILY 270 capsule 0  . glucose blood (ACCU-CHEK AVIVA PLUS) test strip 1 each by Other route 3 (three) times daily. ICD 10 E11.9 100 each 12  . ibuprofen (ADVIL,MOTRIN) 200 MG tablet Take 600 mg by mouth every 8 (eight) hours as needed for mild pain.    Marland Kitchen insulin aspart (NOVOLOG) 100 UNIT/ML injection 12-20 per sliding scale (Patient not taking: Reported on 02/07/2017) 1 vial 5  . insulin glargine (LANTUS) 100 UNIT/ML injection Inject 0.3 mLs (30 Units total) into the skin at bedtime. (Patient not taking: Reported on 02/07/2017) 10 mL 3  . Insulin Pen Needle (B-D ULTRAFINE III SHORT PEN) 31G X 8 MM MISC 1 application by Does not apply route daily. 60 each 5  . Lancet Devices (ACCU-CHEK SOFTCLIX) lancets 1 each by Other route 3 (three) times daily. ICD 10 E11.9 1 each 0  . lisinopril (PRINIVIL,ZESTRIL) 40 MG tablet Take 1 tablet (40 mg total) by mouth daily. 30 tablet 2  . metFORMIN (GLUCOPHAGE-XR) 500 MG 24 hr tablet Take 1 tablet (500 mg total)  by mouth 2 (two) times daily. 60 tablet 2  . oxyCODONE (OXYCONTIN) 10 mg 12 hr tablet Take 10 mg by mouth 4 (four) times daily.     . pantoprazole (PROTONIX) 40 MG tablet Take 1 tablet (40 mg total) by mouth daily. 30 tablet 0  . SUMAtriptan (IMITREX) 25 MG tablet Take 1 tablet (25 mg total) by mouth every 2 (two) hours as needed for migraine. May repeat in 2 hours if headache persists or recurs. 10 tablet 0  . temazepam (RESTORIL) 15 MG capsule Take 1 capsule (15 mg total) by mouth at bedtime. 30 capsule 1  . tiZANidine (ZANAFLEX) 4 MG tablet TAKE 1 TABLET(4 MG) BY MOUTH EVERY 8 HOURS AS NEEDED FOR MUSCLE SPASMS (Patient taking differently:  TAKE 1 TABLET(4 MG) BY MOUTH TWO TIMES DAILY) 90 tablet 2   No current facility-administered medications on file prior to visit.     ROS Review of Systems  Constitutional: Negative for chills and fever.  HENT: Negative for congestion and sinus pressure.   Eyes: Negative for visual disturbance.  Respiratory: Negative for cough and shortness of breath.   Cardiovascular: Negative for chest pain.  Gastrointestinal: Negative for abdominal pain and blood in stool.  Genitourinary: Positive for menstrual problem.  Musculoskeletal: Positive for arthralgias, back pain and gait problem.  Skin: Positive for rash.  Allergic/Immunologic: Negative for immunocompromised state.  Neurological: Positive for headaches.  Hematological: Negative for adenopathy. Does not bruise/bleed easily.  Psychiatric/Behavioral: Positive for sleep disturbance. Negative for dysphoric mood and suicidal ideas.    Objective:   Today's Vitals: BP 136/90   Pulse 88   Temp 97.7 F (36.5 C) (Oral)   Ht 4' 8"  (1.422 m)   Wt 230 lb 6.4 oz (104.5 kg)   LMP 02/10/2017 (Exact Date) Comment: has scoliosis  SpO2 100%   BMI 51.65 kg/m   Wt Readings from Last 3 Encounters:  02/17/17 230 lb 6.4 oz (104.5 kg)  02/10/17 226 lb 9.6 oz (102.8 kg)  01/15/17 247 lb (112 kg)    BP Readings from Last 3 Encounters:  02/17/17 136/90  02/10/17 (!) 177/94  01/02/17 (!) 159/96   Physical Exam  Constitutional: She is oriented to person, place, and time. She appears well-developed and well-nourished. No distress.  Obese   HENT:  Head: Normocephalic and atraumatic.  Cardiovascular: Normal rate, regular rhythm, normal heart sounds and intact distal pulses.   Pulmonary/Chest: Effort normal and breath sounds normal.  Musculoskeletal: She exhibits no edema.  Neurological: She is alert and oriented to person, place, and time.  Skin: Skin is warm and dry. No rash noted.  Psychiatric: She has a normal mood and affect.   Lab Results   Component Value Date   HGBA1C 9.7 01/02/2017    CBG  110 Depression screen [facility] 2/9 01/02/2017 08/22/2016 07/15/2016  Decreased Interest 1 1 1   Down, Depressed, Hopeless 2 2 2   PHQ - 2 Score 3 3 3   Altered sleeping 3 3 3   Tired, decreased energy 2 1 2   Change in appetite 1 1 1   Feeling bad or failure about yourself  2 1 1   Trouble concentrating 2 1 1   Moving slowly or fidgety/restless 1 1 0  Suicidal thoughts 1 1 0  PHQ-9 Score 15 12 11    GAD 7 : Generalized Anxiety Score 01/02/2017 08/22/2016 07/15/2016 05/30/2016  Nervous, Anxious, on Edge 2 1 1 1   Control/stop worrying 3 1 1 2   Worry too much - different things 3 2  2 2  Trouble relaxing 3 3 2 3   Restless 2 2 1 2   Easily annoyed or irritable 1 1 1 1   Afraid - awful might happen 2 0 0 1  Total GAD 7 Score 16 10 8 12      Assessment & Plan:   Problem List Items Addressed This Visit      High   Insomnia (Chronic)   Relevant Medications   temazepam (RESTORIL) 30 MG capsule   Diabetes type 2, uncontrolled (HCC) - Primary (Chronic)   Relevant Medications   insulin glargine (LANTUS) 100 UNIT/ML injection   insulin aspart (NOVOLOG) 100 UNIT/ML injection   Other Relevant Orders   POCT glucose (manual entry) (Completed)   POCT glycosylated hemoglobin (Hb A1C) (Completed)   Ambulatory referral to Ophthalmology   Chronic pain syndrome (Chronic)     Unprioritized   Recurrent headache   Relevant Medications   SUMAtriptan (IMITREX) 25 MG tablet   Other Relevant Orders   Ambulatory referral to Ophthalmology      Outpatient Encounter Prescriptions as of 02/17/2017  Medication Sig  . ACCU-CHEK SOFTCLIX LANCETS lancets 1 each by Other route 3 (three) times daily. ICD 10 E11.9  . albuterol (PROVENTIL HFA;VENTOLIN HFA) 108 (90 Base) MCG/ACT inhaler Inhale 1 puff into the lungs every 6 (six) hours as needed for wheezing or shortness of breath.  Marland Kitchen amitriptyline (ELAVIL) 100 MG tablet Take 1 tablet (100 mg total) by mouth at bedtime.   . Blood Glucose Monitoring Suppl (ACCU-CHEK AVIVA PLUS) w/Device KIT 1 Device by Does not apply route 3 (three) times daily after meals. ICD 10 E11.9  . diclofenac (VOLTAREN) 75 MG EC tablet TAKE 1 TABLET(75 MG) BY MOUTH TWICE DAILY (Patient not taking: Reported on 02/07/2017)  . exenatide (BYETTA 5 MCG PEN) 5 MCG/0.02ML SOPN injection Inject 0.02 mLs (5 mcg total) into the skin 2 (two) times daily with a meal.  . gabapentin (NEURONTIN) 100 MG capsule TAKE 3 CAPSULES(300 MG) BY MOUTH THREE TIMES DAILY  . glucose blood (ACCU-CHEK AVIVA PLUS) test strip 1 each by Other route 3 (three) times daily. ICD 10 E11.9  . ibuprofen (ADVIL,MOTRIN) 200 MG tablet Take 600 mg by mouth every 8 (eight) hours as needed for mild pain.  Marland Kitchen insulin aspart (NOVOLOG) 100 UNIT/ML injection 12-20 per sliding scale (Patient not taking: Reported on 02/07/2017)  . insulin glargine (LANTUS) 100 UNIT/ML injection Inject 0.3 mLs (30 Units total) into the skin at bedtime. (Patient not taking: Reported on 02/07/2017)  . Insulin Pen Needle (B-D ULTRAFINE III SHORT PEN) 31G X 8 MM MISC 1 application by Does not apply route daily.  Elmore Guise Devices (ACCU-CHEK SOFTCLIX) lancets 1 each by Other route 3 (three) times daily. ICD 10 E11.9  . lisinopril (PRINIVIL,ZESTRIL) 40 MG tablet Take 1 tablet (40 mg total) by mouth daily.  . metFORMIN (GLUCOPHAGE-XR) 500 MG 24 hr tablet Take 1 tablet (500 mg total) by mouth 2 (two) times daily.  Marland Kitchen oxyCODONE (OXYCONTIN) 10 mg 12 hr tablet Take 10 mg by mouth 4 (four) times daily.   . pantoprazole (PROTONIX) 40 MG tablet Take 1 tablet (40 mg total) by mouth daily.  . SUMAtriptan (IMITREX) 25 MG tablet Take 1 tablet (25 mg total) by mouth every 2 (two) hours as needed for migraine. May repeat in 2 hours if headache persists or recurs.  . temazepam (RESTORIL) 15 MG capsule Take 1 capsule (15 mg total) by mouth at bedtime.  Marland Kitchen tiZANidine (ZANAFLEX) 4 MG tablet TAKE  1 TABLET(4 MG) BY MOUTH EVERY 8 HOURS AS  NEEDED FOR MUSCLE SPASMS (Patient taking differently: TAKE 1 TABLET(4 MG) BY MOUTH TWO TIMES DAILY)  . [DISCONTINUED] SUMAtriptan (IMITREX) 25 MG tablet Take 1 tablet (25 mg total) by mouth every 2 (two) hours as needed for migraine. May repeat in 2 hours if headache persists or recurs.   No facility-administered encounter medications on file as of 02/17/2017.     Follow-up: Return in about 8 weeks (around 04/14/2017) for insomnia and diabetes.    [name] MD

## 2017-02-18 ENCOUNTER — Encounter (HOSPITAL_COMMUNITY): Admission: RE | Payer: Self-pay | Source: Ambulatory Visit

## 2017-02-18 ENCOUNTER — Inpatient Hospital Stay (HOSPITAL_COMMUNITY): Admission: RE | Admit: 2017-02-18 | Payer: Medicaid Other | Source: Ambulatory Visit | Admitting: Orthopaedic Surgery

## 2017-02-18 SURGERY — ARTHROPLASTY, HIP, TOTAL, ANTERIOR APPROACH
Anesthesia: Choice | Laterality: Left

## 2017-02-23 NOTE — Assessment & Plan Note (Signed)
A: improved diabetes  in obese female with scoliosis affecting hips and back Plan: Better glycemic control with byetta 5 mcg BID  Continue current lantus, novolog and metformin

## 2017-02-23 NOTE — Assessment & Plan Note (Signed)
A; improved with Restoril. Ambien and xanax tried but failed.  P: Increase Restoril to 30 mg nightly.

## 2017-02-24 ENCOUNTER — Encounter (HOSPITAL_COMMUNITY): Payer: Self-pay | Admitting: *Deleted

## 2017-02-24 NOTE — Progress Notes (Signed)
Patient verbalized understanding of instructions  

## 2017-02-24 NOTE — Progress Notes (Signed)
Anesthesia follow-up: See my note from 02/11/17. Patient's left THA is on hold until DM better controlled. She now is scheduled for MRI of T-L-spine and left shoulder under anesthesia on 02/25/17. (H&P by Dr. Louanne Skye with updated evaluation/exam by Dr. Adrian Blackwater on 02/17/17.) According to 02/17/17 PCP note, it appears patient is back on Lantus and Novolog, along with Byetta and metformin. Patient will get a fasting CBG on arrival.   [facility]/Anesthesiology Phone [phone] 02/24/2017 4:24 PM

## 2017-02-25 ENCOUNTER — Ambulatory Visit (HOSPITAL_COMMUNITY): Payer: Medicaid Other | Admitting: Certified Registered Nurse Anesthetist

## 2017-02-25 ENCOUNTER — Ambulatory Visit (HOSPITAL_COMMUNITY)
Admission: RE | Admit: 2017-02-25 | Discharge: 2017-02-25 | Disposition: A | Discharge status: Home / Self Care | Payer: Medicaid Other | Source: Ambulatory Visit | Attending: Specialist | Admitting: Specialist

## 2017-02-25 ENCOUNTER — Encounter (HOSPITAL_COMMUNITY): Payer: Self-pay | Admitting: Urology

## 2017-02-25 ENCOUNTER — Encounter (HOSPITAL_COMMUNITY): Admission: RE | Disposition: A | Discharge status: Home / Self Care | Payer: Self-pay | Source: Ambulatory Visit

## 2017-02-25 DIAGNOSIS — K219 Gastro-esophageal reflux disease without esophagitis: Secondary | ICD-10-CM | POA: Diagnosis not present

## 2017-02-25 DIAGNOSIS — I10 Essential (primary) hypertension: Secondary | ICD-10-CM | POA: Insufficient documentation

## 2017-02-25 DIAGNOSIS — Z794 Long term (current) use of insulin: Secondary | ICD-10-CM | POA: Insufficient documentation

## 2017-02-25 DIAGNOSIS — M4015 Other secondary kyphosis, thoracolumbar region: Secondary | ICD-10-CM

## 2017-02-25 DIAGNOSIS — G4733 Obstructive sleep apnea (adult) (pediatric): Secondary | ICD-10-CM | POA: Diagnosis not present

## 2017-02-25 DIAGNOSIS — M75112 Incomplete rotator cuff tear or rupture of left shoulder, not specified as traumatic: Secondary | ICD-10-CM | POA: Insufficient documentation

## 2017-02-25 DIAGNOSIS — M25512 Pain in left shoulder: Secondary | ICD-10-CM

## 2017-02-25 DIAGNOSIS — Z79899 Other long term (current) drug therapy: Secondary | ICD-10-CM | POA: Diagnosis not present

## 2017-02-25 DIAGNOSIS — Z6841 Body Mass Index (BMI) 40.0 and over, adult: Secondary | ICD-10-CM | POA: Insufficient documentation

## 2017-02-25 DIAGNOSIS — E119 Type 2 diabetes mellitus without complications: Secondary | ICD-10-CM | POA: Insufficient documentation

## 2017-02-25 DIAGNOSIS — M4712 Other spondylosis with myelopathy, cervical region: Secondary | ICD-10-CM

## 2017-02-25 HISTORY — PX: RADIOLOGY WITH ANESTHESIA: SHX6223

## 2017-02-25 LAB — GLUCOSE, CAPILLARY
Glucose-Capillary: 158 mg/dL — ABNORMAL HIGH (ref 65–99)
Glucose-Capillary: 175 mg/dL — ABNORMAL HIGH (ref 65–99)

## 2017-02-25 LAB — I-STAT BETA HCG BLOOD, ED (NOT ORDERABLE)

## 2017-02-25 SURGERY — RADIOLOGY WITH ANESTHESIA
Anesthesia: General

## 2017-02-25 MED ORDER — MIDAZOLAM HCL 2 MG/2ML IJ SOLN
2.0000 mg | Freq: Once | INTRAMUSCULAR | Status: AC
  Administered 2017-02-25: 2 mg via INTRAVENOUS
  Filled 2017-02-25: qty 2

## 2017-02-25 MED ORDER — LACTATED RINGERS IV SOLN
INTRAVENOUS | Status: DC
  Administered 2017-02-25: 08:00:00 via INTRAVENOUS

## 2017-02-25 NOTE — Anesthesia Procedure Notes (Signed)
Procedure Name: LMA Insertion Date/Time: 02/25/2017 11:17 AM Performed by: Merdis Delay Pre-anesthesia Checklist: Patient identified, Emergency Drugs available, Suction available, Patient being monitored and Timeout performed Patient Re-evaluated:Patient Re-evaluated prior to induction Oxygen Delivery Method: Circle system utilized Preoxygenation: Pre-oxygenation with 100% oxygen Induction Type: IV induction Ventilation: Mask ventilation without difficulty LMA: LMA inserted LMA Size: 4.0 Number of attempts: 1 Placement Confirmation: positive ETCO2 and breath sounds checked- equal and bilateral Tube secured with: Tape Dental Injury: Teeth and Oropharynx as per pre-operative assessment

## 2017-02-25 NOTE — Anesthesia Preprocedure Evaluation (Signed)
Anesthesia Evaluation  Patient identified by MRN, date of birth, ID band Patient awake    Reviewed: Allergy & Precautions, H&P , NPO status , Patient's Chart, lab work & pertinent test results  Airway Mallampati: II   Neck ROM: full    Dental   Pulmonary sleep apnea ,    breath sounds clear to auscultation       Cardiovascular hypertension,  Rhythm:regular Rate:Normal     Neuro/Psych  Headaches, PSYCHIATRIC DISORDERS Depression    GI/Hepatic GERD  ,  Endo/Other  diabetes, Type 2Morbid obesity  Renal/GU      Musculoskeletal  (+) Arthritis ,   Abdominal   Peds  Hematology   Anesthesia Other Findings   Reproductive/Obstetrics                             Anesthesia Physical Anesthesia Plan  ASA: II  Anesthesia Plan: General   Post-op Pain Management:    Induction: Intravenous  PONV Risk Score and Plan:   Airway Management Planned: Oral ETT  Additional Equipment:   Intra-op Plan:   Post-operative Plan: Extubation in OR  Informed Consent: I have reviewed the patients History and Physical, chart, labs and discussed the procedure including the risks, benefits and alternatives for the proposed anesthesia with the patient or authorized representative who has indicated his/her understanding and acceptance.     Plan Discussed with: CRNA, Anesthesiologist and Surgeon  Anesthesia Plan Comments:         Anesthesia Quick Evaluation

## 2017-02-25 NOTE — Transfer of Care (Signed)
Immediate Anesthesia Transfer of Care Note  Patient: Briana Williams  Procedure(s) Performed: Procedure(s): RADIOLOGY WITH ANESTHESIA/MRI THORACIC SPINE AND LEFT SHOULDER WITHOUT CONTRAST. (N/A)  Patient Location: PACU  Anesthesia Type:General  Level of Consciousness: drowsy  Airway & Oxygen Therapy: Patient Spontanous Breathing  Post-op Assessment: Report given to RN and Post -op Vital signs reviewed and stable  Post vital signs: Reviewed and stable  Last Vitals:  Vitals:   02/25/17 0750 02/25/17 1110  BP: (!) 170/78 (!) 147/85  Pulse: 89 94  Resp: 14 18  Temp:  36.5 C    Last Pain:  Vitals:   02/25/17 1110  TempSrc:   PainSc: (P) 4          Complications: No apparent anesthesia complications

## 2017-02-26 ENCOUNTER — Other Ambulatory Visit (INDEPENDENT_AMBULATORY_CARE_PROVIDER_SITE_OTHER): Payer: Self-pay | Admitting: Specialist

## 2017-02-26 ENCOUNTER — Encounter (HOSPITAL_COMMUNITY): Payer: Self-pay | Admitting: Radiology

## 2017-02-26 NOTE — Anesthesia Postprocedure Evaluation (Signed)
Anesthesia Post Note  Patient: Briana Williams  Procedure(s) Performed: Procedure(s) (LRB): RADIOLOGY WITH ANESTHESIA/MRI THORACIC SPINE AND LEFT SHOULDER WITHOUT CONTRAST. (N/A)     Patient location during evaluation: PACU Anesthesia Type: General Level of consciousness: awake and alert and patient cooperative Pain management: pain level controlled Vital Signs Assessment: post-procedure vital signs reviewed and stable Respiratory status: spontaneous breathing and respiratory function stable Cardiovascular status: stable Anesthetic complications: no    Last Vitals:  Vitals:   02/25/17 1110 02/25/17 1123  BP: (!) 147/85 (!) 143/84  Pulse: 94 93  Resp: 18 16  Temp: 36.5 C 36.4 C    Last Pain:  Vitals:   02/25/17 1110  TempSrc:   PainSc: [place]

## 2017-02-27 MED FILL — Propofol IV Emul 500 MG/50ML (10 MG/ML): INTRAVENOUS | Qty: 50 | Status: AC

## 2017-02-27 MED FILL — Succinylcholine Chloride Inj 20 MG/ML: INTRAMUSCULAR | Qty: 5 | Status: AC

## 2017-02-27 MED FILL — Phenylephrine-NaCl Pref Syr 0.4 MG/10ML-0.9% (40 MCG/ML): INTRAVENOUS | Qty: 30 | Status: AC

## 2017-02-27 MED FILL — Lactated Ringer's Solution: INTRAVENOUS | Qty: 1000 | Status: AC

## 2017-02-27 MED FILL — Sodium Chloride IV Soln 0.9%: INTRAVENOUS | Qty: 100 | Status: AC

## 2017-02-27 MED FILL — Ondansetron HCl Inj 4 MG/2ML (2 MG/ML): INTRAMUSCULAR | Qty: 2 | Status: AC

## 2017-02-27 MED FILL — Lidocaine HCl Local Soln Prefilled Syringe 100 MG/5ML (2%): INTRAMUSCULAR | Qty: 5 | Status: AC

## 2017-03-04 ENCOUNTER — Inpatient Hospital Stay (INDEPENDENT_AMBULATORY_CARE_PROVIDER_SITE_OTHER): Payer: Medicaid Other | Admitting: Physician Assistant

## 2017-03-05 ENCOUNTER — Other Ambulatory Visit: Payer: Self-pay | Admitting: Family Medicine

## 2017-03-05 DIAGNOSIS — G894 Chronic pain syndrome: Secondary | ICD-10-CM

## 2017-03-17 ENCOUNTER — Other Ambulatory Visit: Payer: Self-pay | Admitting: Family Medicine

## 2017-03-17 DIAGNOSIS — R519 Headache, unspecified: Secondary | ICD-10-CM

## 2017-03-17 DIAGNOSIS — R51 Headache: Secondary | ICD-10-CM

## 2017-03-17 DIAGNOSIS — G894 Chronic pain syndrome: Secondary | ICD-10-CM

## 2017-03-18 ENCOUNTER — Other Ambulatory Visit: Payer: Self-pay | Admitting: Family Medicine

## 2017-03-18 DIAGNOSIS — G894 Chronic pain syndrome: Secondary | ICD-10-CM

## 2017-03-19 ENCOUNTER — Other Ambulatory Visit: Payer: Self-pay | Admitting: Internal Medicine

## 2017-03-19 DIAGNOSIS — R51 Headache: Principal | ICD-10-CM

## 2017-03-19 DIAGNOSIS — R519 Headache, unspecified: Secondary | ICD-10-CM

## 2017-03-19 MED ORDER — SUMATRIPTAN SUCCINATE 25 MG PO TABS: ORAL_TABLET | ORAL | 1 refills | Status: DC

## 2017-03-19 NOTE — Telephone Encounter (Signed)
Refill request for a patient who saw [facility] prior and is reassigned as your patient. Appointment scheduled for 04/24/17

## 2017-03-28 ENCOUNTER — Other Ambulatory Visit: Payer: Self-pay | Admitting: Family Medicine

## 2017-03-28 DIAGNOSIS — I10 Essential (primary) hypertension: Secondary | ICD-10-CM

## 2017-04-15 ENCOUNTER — Other Ambulatory Visit (INDEPENDENT_AMBULATORY_CARE_PROVIDER_SITE_OTHER): Payer: Self-pay | Admitting: Specialist

## 2017-04-16 NOTE — Telephone Encounter (Signed)
[place] Refill request

## 2017-04-24 ENCOUNTER — Encounter: Payer: Self-pay | Admitting: Internal Medicine

## 2017-04-24 ENCOUNTER — Other Ambulatory Visit: Payer: Self-pay | Admitting: Family Medicine

## 2017-04-24 ENCOUNTER — Ambulatory Visit: Payer: Medicaid Other | Attending: Internal Medicine | Admitting: Internal Medicine

## 2017-04-24 VITALS — BP 173/116 | HR 112 | Temp 99.2°F | Resp 16 | Wt 230.8 lb

## 2017-04-24 DIAGNOSIS — Z9889 Other specified postprocedural states: Secondary | ICD-10-CM | POA: Diagnosis not present

## 2017-04-24 DIAGNOSIS — Z808 Family history of malignant neoplasm of other organs or systems: Secondary | ICD-10-CM | POA: Insufficient documentation

## 2017-04-24 DIAGNOSIS — M160 Bilateral primary osteoarthritis of hip: Secondary | ICD-10-CM | POA: Diagnosis not present

## 2017-04-24 DIAGNOSIS — Z833 Family history of diabetes mellitus: Secondary | ICD-10-CM | POA: Diagnosis not present

## 2017-04-24 DIAGNOSIS — Z2821 Immunization not carried out because of patient refusal: Secondary | ICD-10-CM | POA: Diagnosis not present

## 2017-04-24 DIAGNOSIS — E1165 Type 2 diabetes mellitus with hyperglycemia: Secondary | ICD-10-CM | POA: Insufficient documentation

## 2017-04-24 DIAGNOSIS — G894 Chronic pain syndrome: Secondary | ICD-10-CM | POA: Insufficient documentation

## 2017-04-24 DIAGNOSIS — E1129 Type 2 diabetes mellitus with other diabetic kidney complication: Secondary | ICD-10-CM | POA: Diagnosis not present

## 2017-04-24 DIAGNOSIS — N83202 Unspecified ovarian cyst, left side: Secondary | ICD-10-CM | POA: Diagnosis not present

## 2017-04-24 DIAGNOSIS — Z79899 Other long term (current) drug therapy: Secondary | ICD-10-CM | POA: Diagnosis not present

## 2017-04-24 DIAGNOSIS — N838 Other noninflammatory disorders of ovary, fallopian tube and broad ligament: Secondary | ICD-10-CM

## 2017-04-24 DIAGNOSIS — Z794 Long term (current) use of insulin: Principal | ICD-10-CM

## 2017-04-24 DIAGNOSIS — M62830 Muscle spasm of back: Secondary | ICD-10-CM | POA: Diagnosis not present

## 2017-04-24 DIAGNOSIS — I10 Essential (primary) hypertension: Secondary | ICD-10-CM | POA: Diagnosis not present

## 2017-04-24 DIAGNOSIS — Z823 Family history of stroke: Secondary | ICD-10-CM | POA: Diagnosis not present

## 2017-04-24 DIAGNOSIS — F5101 Primary insomnia: Secondary | ICD-10-CM | POA: Insufficient documentation

## 2017-04-24 DIAGNOSIS — IMO0001 Reserved for inherently not codable concepts without codable children: Secondary | ICD-10-CM

## 2017-04-24 DIAGNOSIS — F419 Anxiety disorder, unspecified: Secondary | ICD-10-CM | POA: Insufficient documentation

## 2017-04-24 DIAGNOSIS — N839 Noninflammatory disorder of ovary, fallopian tube and broad ligament, unspecified: Secondary | ICD-10-CM

## 2017-04-24 DIAGNOSIS — IMO0002 Reserved for concepts with insufficient information to code with codable children: Secondary | ICD-10-CM

## 2017-04-24 LAB — POCT GLYCOSYLATED HEMOGLOBIN (HGB A1C): Hemoglobin A1C: 8.6

## 2017-04-24 LAB — GLUCOSE, POCT (MANUAL RESULT ENTRY): POC Glucose: 128 mg/dl — AB (ref 70–99)

## 2017-04-24 MED ORDER — TIZANIDINE HCL 4 MG PO TABS: 4.0000 mg | ORAL_TABLET | Freq: Two times a day (BID) | ORAL | 2 refills | Status: DC | PRN

## 2017-04-24 MED ORDER — SITAGLIPTIN PHOSPHATE 50 MG PO TABS: 50.0000 mg | ORAL_TABLET | Freq: Every day | ORAL | 3 refills | Status: DC

## 2017-04-24 MED ORDER — ESCITALOPRAM OXALATE 10 MG PO TABS: 10.0000 mg | ORAL_TABLET | Freq: Every day | ORAL | 2 refills | Status: DC

## 2017-04-24 MED ORDER — ZOLPIDEM TARTRATE ER 6.25 MG PO TBCR: 6.2500 mg | EXTENDED_RELEASE_TABLET | Freq: Every evening | ORAL | 2 refills | Status: DC | PRN

## 2017-04-24 MED ORDER — AMLODIPINE BESYLATE 5 MG PO TABS: 5.0000 mg | ORAL_TABLET | Freq: Every day | ORAL | 3 refills | Status: DC

## 2017-04-24 NOTE — Progress Notes (Signed)
Patient ID: Briana Williams, female    DOB: January 17, 1981  MRN: [id number]  CC: re-establish; Diabetes; Hypertension; and Insomnia   Subjective: Briana Williams is a 36 y.o. female who presents for chronic ds management. Last saw Dr. Adrian Blackwater 01/2017 Her concerns today include:  Hx of scoliosis (multiple back surgeries), HTN, insomnia, DM2, depression, migraines. OA hips seen on MRI  1. DM: -BS better since being on Byetta. Had a few lows since Byetta. She can tell when BS low. Did have some vomiting for 1st 2 wks -BS before BF:low to mid 100s "which is good for me." Use to be in 300s. After dinner 140-160 On Byetta, Lantus 30 units, Novolog usually 12-15 units BID with breakfast and  dinner. Taken off Metformin due to diarrhea and "blistering my backside." -eating twice a day. "I'm not as hungry" since Byetta  2. HTN: Compliant with Lisinopril. Thinks elev today because she is in pain.  -no device to check BP  3. Chronic back pain: followed by [facility]. Taken off Oxycodone because "it  no longer worked." On Chief Strategy Officer but she feels it is no better so she stopped using it. Does not recall dose and not wearing one today. Has f/u appt in 2 wks - Taking Zanaflex 3 x a day for spasms in lower back, legs and shoulder areas.  -back surgeon is Dr. Kathrynn Humble with [facility], States she will need more surgeries on her neck and back  4. OA hips. Needs THR RT first. Ortho, Dr. Ninfa Linden at [facility], wants A1C down to 7 first  5. Ovarian mass seen on MRI of hip this summer. She forgot to mention to Dr. Adrian Blackwater. Had pelvic US last yr and they were unable to see the ovaries  6. Insomnia: On Restoril 30 mg which has not helped as much as she would like. Did better on Ambien Gets in bed around 9:30 p.m every night Turns off all lights and sounds -toss and turns. Usually fall asleep by 1:30-2 then have to be up by 3 a.m.  -"Nerves bad."  Husband's sister, her two young children  and boyfriend moved in with them 3 wks ago. Pt has 2 teenagers of her own. Situation has been stressful. "I cry at the drop of a hat." Wants something to hel calm her nerves   Patient Active Problem List   Diagnosis Date Noted  . Pain of left hip joint 01/15/2017  . Unilateral primary osteoarthritis, left hip 01/15/2017  . Recurrent headache 01/06/2017  . Bilateral hip pain 12/25/2016  . Pruritus 08/23/2016  . Irregular menstrual cycle 05/30/2016  . Chronic pain syndrome 09/21/2015  . HYPERTRIGLYCERIDEMIA 05/02/2009  . 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 Insomnia 05/01/2009  . SCOLIOSIS 05/18/2008  . Diabetes type 2, uncontrolled ([place]) 04/18/2008  . DEPRESSION 04/18/2008  . Essential hypertension, benign 04/18/2008     Current Outpatient Prescriptions on File Prior to Visit  Medication Sig Dispense Refill  . ACCU-CHEK SOFTCLIX LANCETS lancets 1 each by Other route 3 (three) times daily. ICD 10 E11.9 100 each 12  . albuterol (PROVENTIL HFA;VENTOLIN HFA) 108 (90 Base) MCG/ACT inhaler Inhale 1 puff into the lungs every 6 (six) hours as needed for wheezing or shortness of breath. 8 g 3  . Blood Glucose Monitoring Suppl (ACCU-CHEK AVIVA PLUS) w/Device KIT 1 Device by Does not apply route 3 (three) times daily after meals. ICD 10 E11.9 1 kit 0  . diclofenac (VOLTAREN) 75 MG EC tablet TAKE 1 TABLET(75  MG) BY MOUTH TWICE DAILY (Patient not taking: Reported on 02/07/2017) 180 tablet 3  . diclofenac (VOLTAREN) 75 MG EC tablet TAKE 1 TABLET(75 MG) BY MOUTH TWICE DAILY 60 tablet 0  . gabapentin (NEURONTIN) 100 MG capsule TAKE 3 CAPSULES(300 MG) BY MOUTH THREE TIMES DAILY 270 capsule 2  . glucose blood (ACCU-CHEK AVIVA PLUS) test strip 1 each by Other route 3 (three) times daily. ICD 10 E11.9 100 each 12  . ibuprofen (ADVIL,MOTRIN) 200 MG tablet Take 600 mg by mouth every 8 (eight) hours as needed for mild pain.    Marland Kitchen insulin aspart (NOVOLOG) 100 UNIT/ML injection 12-20 per sliding scale 1 vial 5  .  insulin glargine (LANTUS) 100 UNIT/ML injection Inject 0.3 mLs (30 Units total) into the skin at bedtime. 10 mL 5  . Insulin Pen Needle (B-D ULTRAFINE III SHORT PEN) 31G X 8 MM MISC 1 application by Does not apply route daily. 60 each 5  . Lancet Devices (ACCU-CHEK SOFTCLIX) lancets 1 each by Other route 3 (three) times daily. ICD 10 E11.9 1 each 0  . lisinopril (PRINIVIL,ZESTRIL) 40 MG tablet TAKE 1 TABLET(40 MG) BY MOUTH DAILY 90 tablet 0  . pantoprazole (PROTONIX) 40 MG tablet Take 1 tablet (40 mg total) by mouth daily. 30 tablet 0  . SUMAtriptan (IMITREX) 25 MG tablet Take 1-2 tabs at start of headache. May repeat in 2 hrs if no relief. Max 4 tabs/24 hr. 10 tablet 1   No current facility-administered medications on file prior to visit.     Allergies  Allergen Reactions  . Metformin And Related Diarrhea  . Ultrasound Cream Rash    unknown  . Ultrasound Gel Rash    Patient gets a red rash with use of ultrasound gel    Social History   Social History  . Marital status: Married    Spouse name: N/A  . Number of children: 3  . Years of education: N/A   Occupational History  .  Unemployed   Social History Main Topics  . Smoking status: Never Smoker  . Smokeless tobacco: Never Used  . Alcohol use No  . Drug use: No  . Sexual activity: Yes    Birth control/ protection: Other-see comments     Comment: unable to have children d/t scoliosis   Other Topics Concern  . Not on file   Social History Narrative  . No narrative on file    Family History  Problem Relation Age of Onset  . Cancer Maternal Grandmother        mandibular cancer   . Diabetes Mother   . Heart failure Mother   . CAD Mother        triple CABG  . Diabetes Maternal Aunt   . Stroke Father     Past Surgical History:  Procedure Laterality Date  . BACK SURGERY    . CERVICAL FUSION    . frontal closed sutures to forehead    . Harrington Rod placement    . HIP SURGERY     staph infection  . LUMBAR  FUSION    . RADIOLOGY WITH ANESTHESIA N/A 02/25/2017   Procedure: RADIOLOGY WITH ANESTHESIA/MRI THORACIC SPINE AND LEFT SHOULDER WITHOUT CONTRAST.;  Surgeon: Radiologist, Medication, MD;  Location: [place];  Service: Radiology;  Laterality: N/A;    ROS: Review of Systems Negative except as stated above PHYSICAL EXAM: BP (!) 173/116   Pulse (!) 112   Temp 99.2 F (37.3 C) (Oral)   Resp 16  Wt 230 lb 12.8 oz (104.7 kg)   SpO2 98%   BMI 51.74 kg/m   Wt Readings from Last 3 Encounters:  04/24/17 230 lb 12.8 oz (104.7 kg)  02/25/17 230 lb (104.3 kg)  02/17/17 230 lb 6.4 oz (104.5 kg)    Physical Exam General appearance - alert, well appearing, middle age caucasian female and in no distress Mental status - alert, oriented to person, place, and time, normal mood, behavior, speech, dress, motor activity, and thought processes Neck - no cervical LN Chest - clear to auscultation, no wheezes, rales or rhonchi, symmetric air entry Heart - normal rate, regular rhythm, normal S1, S2, no murmurs, rubs, clicks or gallops Musculoskeletal - deformity of back Extremities - no Le edema  Results for orders placed or performed in visit on 04/24/17  POCT glucose (manual entry)  Result Value Ref Range   POC Glucose 128 (A) 70 - 99 mg/dl  POCT glycosylated hemoglobin (Hb A1C)  Result Value Ref Range   Hemoglobin A1C 8.6     ASSESSMENT AND PLAN: 1. Uncontrolled type 2 diabetes mellitus without complication, with long-term current use of insulin (HCC) -improved. Encourage pt to purchase and keep glucose tabs on her to use for hypoglycemic episodes -Add Januvia - POCT glucose (manual entry) - POCT glycosylated hemoglobin (Hb A1C) - sitaGLIPtin (JANUVIA) 50 MG tablet; Take 1 tablet (50 mg total) by mouth daily.  Dispense: 30 tablet; Refill: 3  2. Essential hypertension, benign Not at goal. Add Norvasc - amLODipine (NORVASC) 5 MG tablet; Take 1 tablet (5 mg total) by mouth daily.  Dispense: 90  tablet; Refill: 3  3. Muscle spasm of back -will cut back on # Zanaflex prescribed a mth form 90 to 60. Use PRn - tiZANidine (ZANAFLEX) 4 MG tablet; Take 1 tablet (4 mg total) by mouth 2 (two) times daily as needed for muscle spasms.  Dispense: 60 tablet; Refill: 2  4. Osteoarthritis of both hips, unspecified osteoarthritis type -followed by ortho, wgh loss encouraged  5. Primary insomnia -good sleep hygiene discussed which sunds as though she is already doing. We agreed to stop the bezo Restoril (pt told to cut pill in 1/2 and take 1/2 for 2 wks then quit).  -stop Amitriptyline -Start Ambien. Discussed risk of dependence, tolerance and accidental over dose especially when used in combo with other meds like narcotics -CMA to go over Controlled Substance prescribing agreement with her today - zolpidem (AMBIEN CR) 6.25 MG CR tablet; Take 1 tablet (6.25 mg total) by mouth at bedtime as needed for sleep. Stop Restoril  Dispense: 30 tablet; Refill: 2  6. Ovarian mass, left - US Pelvis Complete; Future - US Transvaginal Non-OB; Future - Ambulatory referral to Gynecology  7. Anxiety -pt will to try Lexapro - escitalopram (LEXAPRO) 10 MG tablet; Take 1 tablet (10 mg total) by mouth daily. 1/2 tab daily x 2 wks then 1 tab daily  Dispense: 30 tablet; Refill: 2  9. Influenza vaccination declined    Patient was given the opportunity to ask questions.  Patient verbalized understanding of the plan and was able to repeat key elements of the plan.   Orders Placed This Encounter  Procedures  . US Pelvis Complete  . US Transvaginal Non-OB  . Ambulatory referral to Gynecology  . POCT glucose (manual entry)  . POCT glycosylated hemoglobin (Hb A1C)     Requested Prescriptions   Signed Prescriptions Disp Refills  . tiZANidine (ZANAFLEX) 4 MG tablet 60 tablet 2  Sig: Take 1 tablet (4 mg total) by mouth 2 (two) times daily as needed for muscle spasms.  . sitaGLIPtin (JANUVIA) 50 MG tablet 30  tablet 3    Sig: Take 1 tablet (50 mg total) by mouth daily.  Marland Kitchen zolpidem (AMBIEN CR) 6.25 MG CR tablet 30 tablet 2    Sig: Take 1 tablet (6.25 mg total) by mouth at bedtime as needed for sleep. Stop Restoril  . escitalopram (LEXAPRO) 10 MG tablet 30 tablet 2    Sig: Take 1 tablet (10 mg total) by mouth daily. 1/2 tab daily x 2 wks then 1 tab daily  . amLODipine (NORVASC) 5 MG tablet 90 tablet 3    Sig: Take 1 tablet (5 mg total) by mouth daily.    Return in about 3 months (around 07/24/2017).  Karle Plumber, MD, FACP

## 2017-04-24 NOTE — Patient Instructions (Signed)
Stop Amitriptyline and Restoril. Start Ambien as needed for insomnia.  This medication can be habit forming and can suppress breathing when used in combination with narcotic pain medications and other medications that cause drowsiness.  Use only when needed.   Start Januvia. Continue to monitor blood sugars.  Zanaflex has been decreased to twice a day as needed.  Insomnia Insomnia is a sleep disorder that makes it difficult to fall asleep or to stay asleep. Insomnia can cause tiredness (fatigue), low energy, difficulty concentrating, mood swings, and poor performance at work or school. There are three different ways to classify insomnia:  Difficulty falling asleep.  Difficulty staying asleep.  Waking up too early in the morning.  Any type of insomnia can be long-term (chronic) or short-term (acute). Both are common. Short-term insomnia usually lasts for three months or less. Chronic insomnia occurs at least three times a week for longer than three months. What are the causes? Insomnia may be caused by another condition, situation, or substance, such as:  Anxiety.  Certain medicines.  Gastroesophageal reflux disease (GERD) or other gastrointestinal conditions.  Asthma or other breathing conditions.  Restless legs syndrome, sleep apnea, or other sleep disorders.  Chronic pain.  Menopause. This may include hot flashes.  Stroke.  Abuse of alcohol, tobacco, or illegal drugs.  Depression.  Caffeine.  Neurological disorders, such as Alzheimer disease.  An overactive thyroid (hyperthyroidism).  The cause of insomnia may not be known. What increases the risk? Risk factors for insomnia include:  Gender. Women are more commonly affected than men.  Age. Insomnia is more common as you get older.  Stress. This may involve your professional or personal life.  Income. Insomnia is more common in people with lower income.  Lack of exercise.  Irregular work schedule or night  shifts.  Traveling between different time zones.  What are the signs or symptoms? If you have insomnia, trouble falling asleep or trouble staying asleep is the main symptom. This may lead to other symptoms, such as:  Feeling fatigued.  Feeling nervous about going to sleep.  Not feeling rested in the morning.  Having trouble concentrating.  Feeling irritable, anxious, or depressed.  How is this treated? Treatment for insomnia depends on the cause. If your insomnia is caused by an underlying condition, treatment will focus on addressing the condition. Treatment may also include:  Medicines to help you sleep.  Counseling or therapy.  Lifestyle adjustments.  Follow these instructions at home:  Take medicines only as directed by your health care provider.  Keep regular sleeping and waking hours. Avoid naps.  Keep a sleep diary to help you and your health care provider figure out what could be causing your insomnia. Include: ? When you sleep. ? When you wake up during the night. ? How well you sleep. ? How rested you feel the next day. ? Any side effects of medicines you are taking. ? What you eat and drink.  Make your bedroom a comfortable place where it is easy to fall asleep: ? Put up shades or special blackout curtains to block light from outside. ? Use a white noise machine to block noise. ? Keep the temperature cool.  Exercise regularly as directed by your health care provider. Avoid exercising right before bedtime.  Use relaxation techniques to manage stress. Ask your health care provider to suggest some techniques that may work well for you. These may include: ? Breathing exercises. ? Routines to release muscle tension. ? Visualizing peaceful scenes.  Cut back on alcohol, caffeinated beverages, and cigarettes, especially close to bedtime. These can disrupt your sleep.  Do not overeat or eat spicy foods right before bedtime. This can lead to digestive discomfort  that can make it hard for you to sleep.  Limit screen use before bedtime. This includes: ? Watching TV. ? Using your smartphone, tablet, and computer.  Stick to a routine. This can help you fall asleep faster. Try to do a quiet activity, brush your teeth, and go to bed at the same time each night.  Get out of bed if you are still awake after 15 minutes of trying to sleep. Keep the lights down, but try reading or doing a quiet activity. When you feel sleepy, go back to bed.  Make sure that you drive carefully. Avoid driving if you feel very sleepy.  Keep all follow-up appointments as directed by your health care provider. This is important. Contact a health care provider if:  You are tired throughout the day or have trouble in your daily routine due to sleepiness.  You continue to have sleep problems or your sleep problems get worse. Get help right away if:  You have serious thoughts about hurting yourself or someone else. This information is not intended to replace advice given to you by your health care provider. Make sure you discuss any questions you have with your health care provider. Document Released: 07/12/2000 Document Revised: 12/15/2015 Document Reviewed: 04/15/2014 Elsevier Interactive Patient Education  [employer].

## 2017-04-28 ENCOUNTER — Encounter: Payer: Self-pay | Admitting: Pharmacist

## 2017-04-28 NOTE — Progress Notes (Signed)
Prior authorization for Januvia submitted to [facility] Medicaid. Pending approval. Confirmation # [id number]

## 2017-04-29 ENCOUNTER — Ambulatory Visit (HOSPITAL_COMMUNITY): Payer: Medicaid Other

## 2017-05-02 ENCOUNTER — Encounter: Payer: Self-pay | Admitting: Internal Medicine

## 2017-05-07 ENCOUNTER — Ambulatory Visit (HOSPITAL_COMMUNITY)
Admission: RE | Admit: 2017-05-07 | Discharge: 2017-05-07 | Disposition: A | Discharge status: Home / Self Care | Payer: Medicaid Other | Source: Ambulatory Visit | Attending: Internal Medicine | Admitting: Internal Medicine

## 2017-05-07 DIAGNOSIS — N839 Noninflammatory disorder of ovary, fallopian tube and broad ligament, unspecified: Secondary | ICD-10-CM | POA: Diagnosis not present

## 2017-05-07 DIAGNOSIS — N838 Other noninflammatory disorders of ovary, fallopian tube and broad ligament: Secondary | ICD-10-CM

## 2017-05-12 ENCOUNTER — Encounter: Payer: Self-pay | Admitting: Internal Medicine

## 2017-05-13 ENCOUNTER — Other Ambulatory Visit: Payer: Self-pay | Admitting: Family Medicine

## 2017-05-13 ENCOUNTER — Other Ambulatory Visit: Payer: Self-pay | Admitting: Internal Medicine

## 2017-05-13 ENCOUNTER — Other Ambulatory Visit (INDEPENDENT_AMBULATORY_CARE_PROVIDER_SITE_OTHER): Payer: Self-pay | Admitting: Specialist

## 2017-05-13 MED ORDER — FLUCONAZOLE 150 MG PO TABS: 150.0000 mg | ORAL_TABLET | Freq: Once | ORAL | 0 refills | Status: AC

## 2017-05-13 NOTE — Telephone Encounter (Signed)
Covering provider concern

## 2017-05-13 NOTE — Telephone Encounter (Signed)
Diclofenac Refill request

## 2017-05-22 ENCOUNTER — Other Ambulatory Visit: Payer: Self-pay | Admitting: Internal Medicine

## 2017-05-22 DIAGNOSIS — F419 Anxiety disorder, unspecified: Secondary | ICD-10-CM

## 2017-06-10 ENCOUNTER — Encounter: Payer: Self-pay | Admitting: Internal Medicine

## 2017-06-11 NOTE — Telephone Encounter (Signed)
Patient mychart concern.

## 2017-06-13 ENCOUNTER — Telehealth (INDEPENDENT_AMBULATORY_CARE_PROVIDER_SITE_OTHER): Payer: Self-pay | Admitting: Specialist

## 2017-06-13 NOTE — Telephone Encounter (Signed)
Diclofenac refill request 

## 2017-06-16 ENCOUNTER — Ambulatory Visit (INDEPENDENT_AMBULATORY_CARE_PROVIDER_SITE_OTHER): Payer: Medicaid Other | Admitting: Obstetrics and Gynecology

## 2017-06-16 ENCOUNTER — Other Ambulatory Visit (HOSPITAL_COMMUNITY)
Admission: RE | Admit: 2017-06-16 | Discharge: 2017-06-16 | Disposition: A | Discharge status: Home / Self Care | Payer: Medicaid Other | Source: Ambulatory Visit | Attending: Obstetrics and Gynecology | Admitting: Obstetrics and Gynecology

## 2017-06-16 ENCOUNTER — Encounter: Payer: Self-pay | Admitting: Obstetrics and Gynecology

## 2017-06-16 VITALS — BP 110/54 | HR 70 | Ht <= 58 in | Wt 221.0 lb

## 2017-06-16 DIAGNOSIS — N839 Noninflammatory disorder of ovary, fallopian tube and broad ligament, unspecified: Secondary | ICD-10-CM

## 2017-06-16 DIAGNOSIS — R102 Pelvic and perineal pain: Secondary | ICD-10-CM

## 2017-06-16 DIAGNOSIS — Z3202 Encounter for pregnancy test, result negative: Secondary | ICD-10-CM | POA: Diagnosis not present

## 2017-06-16 DIAGNOSIS — N939 Abnormal uterine and vaginal bleeding, unspecified: Secondary | ICD-10-CM | POA: Diagnosis not present

## 2017-06-16 DIAGNOSIS — N838 Other noninflammatory disorders of ovary, fallopian tube and broad ligament: Secondary | ICD-10-CM

## 2017-06-16 LAB — POCT PREGNANCY, URINE: Preg Test, Ur: NEGATIVE

## 2017-06-16 NOTE — Progress Notes (Signed)
GYNECOLOGY OFFICE VISIT NOTE  History:  36 y.o. G0P0000 here today for f/u for ovarian mass and abnormal uterine bleeding. She denies any abnormal vaginal discharge or concern for STI.   H/o right sided ovarian "mass" removal. Saw Dr. Armida Sans ~ 10 years ago, who felt clavicular lumps, patient felt she was rude and didn't return. She reports clavicular lumps were checked and we normal. Has had pap smear within the year - Dr. Loma Messing [facility]). Reports all paps have been normal.  Reports monthly periods for the last year that are sometimes lasting 2 weeks, reports she goes through a 30 pack of pads per period. More painful than in the past. She is strongly desirous of surgical management and wants [name] to "take everything out." Has never been pregnant (reports she cannot due to scoliosis) and does not have any desire to be.  Past Medical History:  Diagnosis Date  . Arthritis   . GERD (gastroesophageal reflux disease)   . Headache    migraine 2 weeks ago  . Hyperlipidemia   . Hypertension associated with diabetes ([place])   . Renal disorder    has both kidneys, compacted and 'squashed together"  . Scoliosis   . Sleep apnea    in the midst of being tested for it.   waiting on scheduling   . Type 2 diabetes mellitus treated with insulin ([place])    dx 2000    Past Surgical History:  Procedure Laterality Date  . BACK SURGERY    . CERVICAL FUSION    . frontal closed sutures to forehead    . Harrington Rod placement    . HIP SURGERY     staph infection  . LUMBAR FUSION    . RADIOLOGY WITH ANESTHESIA/MRI THORACIC SPINE AND LEFT SHOULDER WITHOUT CONTRAST. N/A 02/25/2017   Performed by Radiologist, Medication, MD at [facility] OR     Current Outpatient Medications:  .  ACCU-CHEK SOFTCLIX LANCETS lancets, 1 each by Other route 3 (three) times daily. ICD 10 E11.9, Disp: 100 each, Rfl: 12 .  albuterol (PROVENTIL HFA;VENTOLIN HFA) 108 (90 Base) MCG/ACT inhaler, Inhale 1 puff  into the lungs every 6 (six) hours as needed for wheezing or shortness of breath., Disp: 8 g, Rfl: 3 .  amLODipine (NORVASC) 5 MG tablet, Take 1 tablet (5 mg total) by mouth daily., Disp: 90 tablet, Rfl: 3 .  Blood Glucose Monitoring Suppl (ACCU-CHEK AVIVA PLUS) w/Device KIT, 1 Device by Does not apply route 3 (three) times daily after meals. ICD 10 E11.9, Disp: 1 kit, Rfl: 0 .  BYETTA 5 MCG PEN 5 MCG/0.02ML SOPN injection, INJECT 5 MCG UNDER THE SKIN TWICE DAILY WITH A MEAL, Disp: 1.2 mL, Rfl: 2 .  diclofenac (VOLTAREN) 75 MG EC tablet, TAKE 1 TABLET(75 MG) BY MOUTH TWICE DAILY, Disp: 180 tablet, Rfl: 3 .  escitalopram (LEXAPRO) 10 MG tablet, Take 1 tablet (10 mg total) by mouth daily., Disp: 90 tablet, Rfl: 0 .  gabapentin (NEURONTIN) 100 MG capsule, TAKE 3 CAPSULES(300 MG) BY MOUTH THREE TIMES DAILY, Disp: 270 capsule, Rfl: 2 .  glucose blood (ACCU-CHEK AVIVA PLUS) test strip, 1 each by Other route 3 (three) times daily. ICD 10 E11.9, Disp: 100 each, Rfl: 12 .  ibuprofen (ADVIL,MOTRIN) 200 MG tablet, Take 600 mg by mouth every 8 (eight) hours as needed for mild pain., Disp: , Rfl:  .  insulin aspart (NOVOLOG) 100 UNIT/ML injection, 12-20 per sliding scale, Disp: 1 vial, Rfl: 5 .  insulin glargine (LANTUS) 100 UNIT/ML injection, Inject 0.3 mLs (30 Units total) into the skin at bedtime., Disp: 10 mL, Rfl: 5 .  Insulin Pen Needle (B-D ULTRAFINE III SHORT PEN) 31G X 8 MM MISC, 1 application by Does not apply route daily., Disp: 60 each, Rfl: 5 .  Lancet Devices (ACCU-CHEK SOFTCLIX) lancets, 1 each by Other route 3 (three) times daily. ICD 10 E11.9, Disp: 1 each, Rfl: 0 .  lisinopril (PRINIVIL,ZESTRIL) 40 MG tablet, TAKE 1 TABLET(40 MG) BY MOUTH DAILY, Disp: 90 tablet, Rfl: 0 .  pantoprazole (PROTONIX) 40 MG tablet, Take 1 tablet (40 mg total) by mouth daily., Disp: 30 tablet, Rfl: 0 .  sitaGLIPtin (JANUVIA) 50 MG tablet, Take 1 tablet (50 mg total) by mouth daily., Disp: 30 tablet, Rfl: 3 .   SUMAtriptan (IMITREX) 25 MG tablet, Take 1-2 tabs at start of headache. May repeat in 2 hrs if no relief. Max 4 tabs/24 hr., Disp: 10 tablet, Rfl: 1 .  tiZANidine (ZANAFLEX) 4 MG tablet, Take 1 tablet (4 mg total) by mouth 2 (two) times daily as needed for muscle spasms., Disp: 60 tablet, Rfl: 2 .  zolpidem (AMBIEN CR) 6.25 MG CR tablet, Take 1 tablet (6.25 mg total) by mouth at bedtime as needed for sleep. Stop Restoril, Disp: 30 tablet, Rfl: 2 .  diclofenac (VOLTAREN) 75 MG EC tablet, TAKE 1 TABLET(75 MG) BY MOUTH TWICE DAILY, Disp: 60 tablet, Rfl: 0  The following portions of the patient's history were reviewed and updated as appropriate: allergies, current medications, past family history, past medical history, past social history, past surgical history and problem list.   Health Maintenance:  Last pap: 06/2016 negative Last mammogram: n/a  Review of Systems:  Pertinent items noted in HPI and remainder of comprehensive ROS otherwise negative.   Objective:  Physical Exam BP (!) 110/54   Pulse 70   Ht 4' 8.5" (1.435 m)   Wt 221 lb (100.2 kg)   LMP 05/28/2017   BMI 48.67 kg/m  CONSTITUTIONAL: Well-developed, well-nourished female in no acute distress.  HENT:  Normocephalic, atraumatic. External right and left ear normal. Oropharynx is clear and moist EYES: Conjunctivae and EOM are normal. Pupils are equal, round, and reactive to light. No scleral icterus.  SKIN: Skin is warm and dry. No rash noted. Not diaphoretic. No erythema. No pallor. NEUROLOGIC: Alert and oriented to person, place, and time. In wheelchair as she cannot walk for long periods of time PSYCHIATRIC: Normal mood and affect. Normal behavior. Normal judgment and thought content. RESPIRATORY:  no problems with respiration noted ABDOMEN: Soft, no distention noted.   PELVIC: normal appearing external genitalia with irritated appearing skin at groin skin folds, skin dry with no cracks but with appearance of chronic  irritation, normal appearing vagina with nulliparous cervix MUSCULOSKELETAL: limited range of motion No edema noted.  Labs and Imaging No results found.  Assessment & Plan:  1. Pelvic pain Possibly 2/2 ovarian mass  2. Abnormal uterine bleeding - recommended EMB, patient agreeable, please see separate procedure note - Surgical pathology Reviewed etiology of abnormal uterine bleeding with patient. Reviewed options for management including expectant management, medical management and surgical management. Reviewed that medical management is preferred. Patient with h/o HTN and not a candidate for CHCs. Reviewed management in form of progesterone IUD and recommendation for medical management. Patient strongly desirous of surgical management without trial of medical management.  Will obtain EMB results and call patient to discuss results/further options  3. Ovarian mass -  MR Pelvis Ltd; Future MRI pelvis to further characterize mass, reviewed common etiology for ovarian masses and that this one appears benign however with pain, should further characterize it. Reviewed that for benign appearing masses which are smaller in size and not otherwise causing issues, do not recommend removal  Patient declined STI testing.  Routine preventative health maintenance measures emphasized. Please refer to After Visit Summary for other counseling recommendations.    Feliz Beam, M.D. Attending [place], [facility] for [employer], [place]

## 2017-06-16 NOTE — Progress Notes (Signed)
ENDOMETRIAL BIOPSY      Briana Williams is a 36 y.o. G0P0000 here for endometrial biopsy.  The indications for endometrial biopsy were reviewed.  Risks of the biopsy including cramping, bleeding, infection, uterine perforation, inadequate specimen and need for additional procedures were discussed. The patient states she understands and agrees to undergo procedure today. Consent was signed. Time out was performed.   Indications: abnormal uterine bleeding Urine HCG: negative  A bivalve speculum was placed into the vagina and the cervix was easily visualized and was prepped with Betadine x2. A single-toothed tenaculum was placed on the anterior lip of the cervix to stabilize it. The 3 mm pipelle was introduced into the endometrial cavity without difficulty to a depth of 7 cm, and a moderate amount of tissue was obtained and sent to pathology. This was repeated for a total of 3 passes. The instruments were removed from the patient's vagina. Minimal bleeding from the cervix at the tenaculum was noted.   The patient tolerated the procedure well. Routine post-procedure instructions were given to the patient.     Feliz Beam, M.D. Attending [place], [facility] for [employer], [place]

## 2017-06-20 ENCOUNTER — Other Ambulatory Visit: Payer: Self-pay | Admitting: Internal Medicine

## 2017-06-20 DIAGNOSIS — I10 Essential (primary) hypertension: Secondary | ICD-10-CM

## 2017-06-24 ENCOUNTER — Telehealth: Payer: Self-pay | Admitting: Obstetrics and Gynecology

## 2017-06-24 NOTE — Telephone Encounter (Signed)
Called to inform patient of negative EMB results for AUB. She reports several days of significant pain and some spotting that has since improved. She started period recently. Otherwise, no complaints. Recommended Levonorgestrel IUD for AUB but patient declines. Will follow up on scheduling of pelvic MRI and follow up thereafter.   Feliz Beam, M.D. Attending [place], [facility] for [employer], [place]

## 2017-06-25 ENCOUNTER — Other Ambulatory Visit: Payer: Self-pay | Admitting: Internal Medicine

## 2017-06-25 DIAGNOSIS — G894 Chronic pain syndrome: Secondary | ICD-10-CM

## 2017-06-30 NOTE — Telephone Encounter (Signed)
Patient calling to check status on RX refill. She said [facility] faxed over information but she has not heard anything. Has this been called in? CB # [phone]

## 2017-06-30 NOTE — Telephone Encounter (Signed)
Please advise-message already sent

## 2017-07-01 ENCOUNTER — Telehealth: Payer: Self-pay | Admitting: General Practice

## 2017-07-01 ENCOUNTER — Other Ambulatory Visit (INDEPENDENT_AMBULATORY_CARE_PROVIDER_SITE_OTHER): Payer: Self-pay | Admitting: Specialist

## 2017-07-01 DIAGNOSIS — N949 Unspecified condition associated with female genital organs and menstrual cycle: Secondary | ICD-10-CM

## 2017-07-01 MED ORDER — DICLOFENAC SODIUM 75 MG PO TBEC: DELAYED_RELEASE_TABLET | ORAL | 0 refills | Status: DC

## 2017-07-01 NOTE — Telephone Encounter (Signed)
Rx sent to [facility], appears to default to [facility]. jen

## 2017-07-01 NOTE — Telephone Encounter (Signed)
-----   Message from Sloan Leiter, MD sent at 06/25/2017  4:10 PM EST ----- Please call and set up pelvic MRI for this lady.

## 2017-07-01 NOTE — Telephone Encounter (Signed)
Scheduled MRI at [facility] for 12/10 @ 4pm. Called patient, no answer- left message stating we are trying to reach you regarding an appt we have set up for you, please check your mychart account for more information

## 2017-07-07 ENCOUNTER — Ambulatory Visit (HOSPITAL_COMMUNITY): Admission: RE | Admit: 2017-07-07 | Payer: Medicaid Other | Source: Ambulatory Visit

## 2017-07-08 ENCOUNTER — Other Ambulatory Visit: Payer: Self-pay | Admitting: Internal Medicine

## 2017-07-08 DIAGNOSIS — M62830 Muscle spasm of back: Secondary | ICD-10-CM

## 2017-07-11 ENCOUNTER — Other Ambulatory Visit: Payer: Self-pay | Admitting: Internal Medicine

## 2017-07-11 DIAGNOSIS — M62830 Muscle spasm of back: Secondary | ICD-10-CM

## 2017-07-11 NOTE — Telephone Encounter (Signed)
Patient called saying pharmacy needs pre authorization for her medication so she has been unable to get it. [facility] is the one that is refilling so they need to be called. Patient did not have their number. Thanks!

## 2017-07-12 ENCOUNTER — Ambulatory Visit (HOSPITAL_COMMUNITY)
Admission: RE | Admit: 2017-07-12 | Discharge: 2017-07-12 | Disposition: A | Discharge status: Home / Self Care | Payer: Medicaid Other | Source: Ambulatory Visit | Attending: Obstetrics and Gynecology | Admitting: Obstetrics and Gynecology

## 2017-07-12 DIAGNOSIS — N949 Unspecified condition associated with female genital organs and menstrual cycle: Secondary | ICD-10-CM

## 2017-07-14 ENCOUNTER — Telehealth: Payer: Self-pay | Admitting: Internal Medicine

## 2017-07-14 ENCOUNTER — Other Ambulatory Visit: Payer: Self-pay | Admitting: Internal Medicine

## 2017-07-14 DIAGNOSIS — F419 Anxiety disorder, unspecified: Secondary | ICD-10-CM

## 2017-07-14 DIAGNOSIS — M62830 Muscle spasm of back: Secondary | ICD-10-CM

## 2017-07-14 DIAGNOSIS — I10 Essential (primary) hypertension: Secondary | ICD-10-CM

## 2017-07-14 MED ORDER — TIZANIDINE HCL 4 MG PO TABS: 4.0000 mg | ORAL_TABLET | Freq: Two times a day (BID) | ORAL | 2 refills | Status: DC | PRN

## 2017-07-14 MED ORDER — ESCITALOPRAM OXALATE 10 MG PO TABS: 10.0000 mg | ORAL_TABLET | Freq: Every day | ORAL | 1 refills | Status: DC

## 2017-07-14 MED ORDER — LISINOPRIL 40 MG PO TABS: 40.0000 mg | ORAL_TABLET | Freq: Every day | ORAL | 1 refills | Status: DC

## 2017-07-14 NOTE — Telephone Encounter (Signed)
I completed PA on [place] [employer]. PA# [id number], Valid 07/14/2017-07/14/2018

## 2017-07-14 NOTE — Telephone Encounter (Signed)
RF on Zanaflex sent to [facility].

## 2017-07-14 NOTE — Telephone Encounter (Signed)
Pt came in to request a refill on  -tiZANidine (ZANAFLEX) 4 MG tablet  Please follow up

## 2017-07-14 NOTE — Addendum Note (Signed)
Addended by: Karle Plumber B on: 07/14/2017 08:29 PM   Modules accepted: Orders

## 2017-07-15 ENCOUNTER — Other Ambulatory Visit: Payer: Self-pay | Admitting: Internal Medicine

## 2017-07-15 DIAGNOSIS — F5101 Primary insomnia: Secondary | ICD-10-CM

## 2017-07-16 ENCOUNTER — Encounter: Payer: Self-pay | Admitting: Internal Medicine

## 2017-07-23 ENCOUNTER — Encounter: Payer: Self-pay | Admitting: Obstetrics and Gynecology

## 2017-08-01 ENCOUNTER — Ambulatory Visit: Payer: Medicaid Other | Attending: Internal Medicine | Admitting: Internal Medicine

## 2017-08-01 ENCOUNTER — Encounter: Payer: Self-pay | Admitting: Internal Medicine

## 2017-08-01 VITALS — BP 137/82 | HR 86 | Temp 98.0°F | Resp 16 | Wt 243.4 lb

## 2017-08-01 DIAGNOSIS — E119 Type 2 diabetes mellitus without complications: Secondary | ICD-10-CM | POA: Diagnosis present

## 2017-08-01 DIAGNOSIS — F329 Major depressive disorder, single episode, unspecified: Secondary | ICD-10-CM | POA: Insufficient documentation

## 2017-08-01 DIAGNOSIS — Z794 Long term (current) use of insulin: Secondary | ICD-10-CM | POA: Diagnosis not present

## 2017-08-01 DIAGNOSIS — E781 Pure hyperglyceridemia: Secondary | ICD-10-CM | POA: Diagnosis not present

## 2017-08-01 DIAGNOSIS — M419 Scoliosis, unspecified: Secondary | ICD-10-CM | POA: Diagnosis not present

## 2017-08-01 DIAGNOSIS — Z79899 Other long term (current) drug therapy: Secondary | ICD-10-CM | POA: Insufficient documentation

## 2017-08-01 DIAGNOSIS — M160 Bilateral primary osteoarthritis of hip: Secondary | ICD-10-CM | POA: Insufficient documentation

## 2017-08-01 DIAGNOSIS — L680 Hirsutism: Secondary | ICD-10-CM | POA: Diagnosis not present

## 2017-08-01 DIAGNOSIS — N839 Noninflammatory disorder of ovary, fallopian tube and broad ligament, unspecified: Secondary | ICD-10-CM | POA: Insufficient documentation

## 2017-08-01 DIAGNOSIS — E1165 Type 2 diabetes mellitus with hyperglycemia: Secondary | ICD-10-CM

## 2017-08-01 DIAGNOSIS — G43909 Migraine, unspecified, not intractable, without status migrainosus: Secondary | ICD-10-CM | POA: Insufficient documentation

## 2017-08-01 DIAGNOSIS — G4700 Insomnia, unspecified: Secondary | ICD-10-CM | POA: Diagnosis not present

## 2017-08-01 DIAGNOSIS — M62838 Other muscle spasm: Secondary | ICD-10-CM | POA: Insufficient documentation

## 2017-08-01 DIAGNOSIS — Z888 Allergy status to other drugs, medicaments and biological substances status: Secondary | ICD-10-CM | POA: Diagnosis not present

## 2017-08-01 DIAGNOSIS — I10 Essential (primary) hypertension: Secondary | ICD-10-CM | POA: Insufficient documentation

## 2017-08-01 DIAGNOSIS — Z833 Family history of diabetes mellitus: Secondary | ICD-10-CM | POA: Diagnosis not present

## 2017-08-01 DIAGNOSIS — G894 Chronic pain syndrome: Secondary | ICD-10-CM | POA: Diagnosis not present

## 2017-08-01 DIAGNOSIS — Z823 Family history of stroke: Secondary | ICD-10-CM | POA: Diagnosis not present

## 2017-08-01 DIAGNOSIS — F419 Anxiety disorder, unspecified: Secondary | ICD-10-CM | POA: Diagnosis not present

## 2017-08-01 DIAGNOSIS — L679 Hair color and hair shaft abnormality, unspecified: Secondary | ICD-10-CM | POA: Diagnosis not present

## 2017-08-01 DIAGNOSIS — IMO0001 Reserved for inherently not codable concepts without codable children: Secondary | ICD-10-CM

## 2017-08-01 DIAGNOSIS — Z981 Arthrodesis status: Secondary | ICD-10-CM | POA: Diagnosis not present

## 2017-08-01 DIAGNOSIS — Z8249 Family history of ischemic heart disease and other diseases of the circulatory system: Secondary | ICD-10-CM | POA: Diagnosis not present

## 2017-08-01 DIAGNOSIS — N838 Other noninflammatory disorders of ovary, fallopian tube and broad ligament: Secondary | ICD-10-CM

## 2017-08-01 LAB — GLUCOSE, POCT (MANUAL RESULT ENTRY)
POC GLUCOSE: 292 mg/dL — AB (ref 70–99)
POC Glucose: 315 mg/dl — AB (ref 70–99)

## 2017-08-01 LAB — POCT GLYCOSYLATED HEMOGLOBIN (HGB A1C): Hemoglobin A1C: 10.6

## 2017-08-01 MED ORDER — AMITRIPTYLINE HCL 25 MG PO TABS: 25.0000 mg | ORAL_TABLET | Freq: Every day | ORAL | 2 refills | Status: DC

## 2017-08-01 MED ORDER — TIZANIDINE HCL 4 MG PO TABS: 4.0000 mg | ORAL_TABLET | Freq: Three times a day (TID) | ORAL | 2 refills | Status: DC | PRN

## 2017-08-01 MED ORDER — INSULIN GLARGINE 100 UNIT/ML ~~LOC~~ SOLN: 34.0000 [IU] | Freq: Every day | SUBCUTANEOUS | 11 refills | Status: DC

## 2017-08-01 MED ORDER — HYDROCHLOROTHIAZIDE 12.5 MG PO TABS: 12.5000 mg | ORAL_TABLET | Freq: Every day | ORAL | 6 refills | Status: DC

## 2017-08-01 NOTE — Progress Notes (Signed)
Patient ID: Briana Williams, female    DOB: 05-28-1981  MRN: [id number]  CC: Diabetes   Subjective: Briana Williams is a 37 y.o. female who presents for chronic disease management. Her concerns today include:  Hx of scoliosis (multiple back surgeries), HTN, insomnia, DM2, depression, migraines. OA hips seen on MRI  1.  DM:  Over did it with snacking over the holidays Not checking BS as much; but has been in low 200s Compliant with Lantus, Byetta, Januvia and NovoLog  2. HTN:  Norvasc added last visit. Reports some swelling in ankles and feet with this Reports compliance with Norvasc, HCTZ and lisinopril 3. Anxiety: ok with Lexapro  4. GYN: Referred to GYN on last visit for further evaluation of ovarian mass.  She was also seen for abnormal uterine bleeding.  Had neg EMB.  IUD was recommended for the irregular heavy bleeding but patient declined.  Would like to have hysterectomy but GYN does not think that is indicated.  She wants to know what other options might there be She was referred for pelvic MRI.  5.  Pain: taken off pain patch 2-3 mths ago.  She has a follow-up appointment with headache pain clinic later this month  6. Insomnia: On last visit we had discontinued amitriptyline and Restoril.  She was placed on Ambien instead.  However patient continued to fill both Ambien and Restoril.  States that she was confused and thought that we had only discontinued the amitriptyline.  I had declined to refill Ambien because of this.  She reports that she has not slept well since being out of Ambien and Restoril  Patient Active Problem List   Diagnosis Date Noted  . Muscle spasm of back 04/24/2017  . Osteoarthritis of both hips 01/15/2017  . Recurrent headache 01/06/2017  . Irregular menstrual cycle 05/30/2016  . Chronic pain syndrome 09/21/2015  . HYPERTRIGLYCERIDEMIA 05/02/2009  . 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 Insomnia 05/01/2009  . SCOLIOSIS 05/18/2008  . Diabetes  type 2, uncontrolled ([place]) 04/18/2008  . DEPRESSION 04/18/2008  . Essential hypertension, benign 04/18/2008     Current Outpatient Medications on File Prior to Visit  Medication Sig Dispense Refill  . ACCU-CHEK SOFTCLIX LANCETS lancets 1 each by Other route 3 (three) times daily. ICD 10 E11.9 100 each 12  . albuterol (PROVENTIL HFA;VENTOLIN HFA) 108 (90 Base) MCG/ACT inhaler Inhale 1 puff into the lungs every 6 (six) hours as needed for wheezing or shortness of breath. 8 g 3  . Blood Glucose Monitoring Suppl (ACCU-CHEK AVIVA PLUS) w/Device KIT 1 Device by Does not apply route 3 (three) times daily after meals. ICD 10 E11.9 1 kit 0  . BYETTA 5 MCG PEN 5 MCG/0.02ML SOPN injection INJECT 5 MCG UNDER THE SKIN TWICE DAILY WITH A MEAL 1.2 mL 2  . diclofenac (VOLTAREN) 75 MG EC tablet TAKE 1 TABLET(75 MG) BY MOUTH TWICE DAILY 60 tablet 0  . escitalopram (LEXAPRO) 10 MG tablet Take 1 tablet (10 mg total) by mouth daily. 90 tablet 1  . gabapentin (NEURONTIN) 100 MG capsule TAKE 3 CAPSULES BY MOUTH THREE TIMES DAILY 270 capsule 6  . glucose blood (ACCU-CHEK AVIVA PLUS) test strip 1 each by Other route 3 (three) times daily. ICD 10 E11.9 100 each 12  . ibuprofen (ADVIL,MOTRIN) 200 MG tablet Take 600 mg by mouth every 8 (eight) hours as needed for mild pain.    Marland Kitchen insulin aspart (NOVOLOG) 100 UNIT/ML injection 12-20 per sliding scale 1 vial 5  .  Insulin Pen Needle (B-D ULTRAFINE III SHORT PEN) 31G X 8 MM MISC 1 application by Does not apply route daily. 60 each 5  . Lancet Devices (ACCU-CHEK SOFTCLIX) lancets 1 each by Other route 3 (three) times daily. ICD 10 E11.9 1 each 0  . lisinopril (PRINIVIL,ZESTRIL) 40 MG tablet Take 1 tablet (40 mg total) by mouth daily. 90 tablet 1  . pantoprazole (PROTONIX) 40 MG tablet Take 1 tablet (40 mg total) by mouth daily. 30 tablet 0  . sitaGLIPtin (JANUVIA) 50 MG tablet Take 1 tablet (50 mg total) by mouth daily. 30 tablet 3  . SUMAtriptan (IMITREX) 25 MG tablet Take 1-2  tabs at start of headache. May repeat in 2 hrs if no relief. Max 4 tabs/24 hr. 10 tablet 1   No current facility-administered medications on file prior to visit.     Allergies  Allergen Reactions  . Metformin And Related Diarrhea  . Ultrasound Cream Rash    unknown  . Ultrasound Gel Rash    Patient gets a red rash with use of ultrasound gel    Social History   Socioeconomic History  . Marital status: Married    Spouse name: Not on file  . Number of children: 3  . Years of education: Not on file  . Highest education level: Not on file  Social Needs  . Financial resource strain: Not on file  . Food insecurity - worry: Not on file  . Food insecurity - inability: Not on file  . Transportation needs - medical: Not on file  . Transportation needs - non-medical: Not on file  Occupational History    Employer: UNEMPLOYED  Tobacco Use  . Smoking status: Never Smoker  . Smokeless tobacco: Never Used  Substance and Sexual Activity  . Alcohol use: No  . Drug use: No  . Sexual activity: Yes    Birth control/protection: Other-see comments    Comment: unable to have children d/t scoliosis  Other Topics Concern  . Not on file  Social History Narrative  . Not on file    Family History  Problem Relation Age of Onset  . Cancer Maternal Grandmother        mandibular cancer   . Diabetes Mother   . Heart failure Mother   . CAD Mother        triple CABG  . Diabetes Maternal Aunt   . Stroke Father     Past Surgical History:  Procedure Laterality Date  . BACK SURGERY    . CERVICAL FUSION    . frontal closed sutures to forehead    . Harrington Rod placement    . HIP SURGERY     staph infection  . LUMBAR FUSION    . RADIOLOGY WITH ANESTHESIA N/A 02/25/2017   Procedure: RADIOLOGY WITH ANESTHESIA/MRI THORACIC SPINE AND LEFT SHOULDER WITHOUT CONTRAST.;  Surgeon: Radiologist, Medication, MD;  Location: [place];  Service: Radiology;  Laterality: N/A;    ROS: Review of Systems    Complains of hirsutism on the chin which she has had for several years.  She shaves to keep the hair down.  Wanting to know if there is anything that she can do to stop the hair growth.  Patient reports being diagnosed with polycystic ovarian syndrome in the past.  Her mother also has hirsutism. MSK: Requests increased dosing on Zanaflex to 3 times daily for increased spasms in the legs and back PHYSICAL EXAM: BP 137/82   Pulse 86   Temp 98  F (36.7 C) (Oral)   Resp 16   Wt 243 lb 6.4 oz (110.4 kg)   SpO2 97%   BMI 53.61 kg/m   Wt Readings from Last 3 Encounters:  08/01/17 243 lb 6.4 oz (110.4 kg)  06/16/17 221 lb (100.2 kg)  04/24/17 230 lb 12.8 oz (104.7 kg)    Physical Exam  General appearance - alert, well appearing, and in no distress Mental status - alert, oriented to person, place, and time, normal mood, behavior, speech, dress, motor activity, and thought processes Neck - supple, no significant adenopathy Chest - clear to auscultation, no wheezes, rales or rhonchi, symmetric air entry Heart - normal rate, regular rhythm, normal S1, S2, no murmurs, rubs, clicks or gallops Extremities - peripheral pulses normal, no pedal edema, no clubbing or cyanosis Skin: Mild hirsutism under the chin and sideburns A1C 10.6 BS 315  ASSESSMENT AND PLAN: 1. Uncontrolled type 2 diabetes mellitus without complication, with long-term current use of insulin ([place]) Discussed the importance of healthy eating habits and helping to control diabetes. Increase Lantus to 34 units daily.  If after 3 days morning blood sugars are still greater than 150 she will increase Lantus to 37 units. - POCT glucose (manual entry) - POCT glycosylated hemoglobin (Hb A1C) - insulin glargine (LANTUS) 100 UNIT/ML injection; Inject 0.34 mLs (34 Units total) into the skin at bedtime.  Dispense: 10 mL; Refill: 11 - POCT glucose (manual entry)  2. Essential hypertension Stop amlodipine.  Start HCTZ -  hydrochlorothiazide (HYDRODIURIL) 12.5 MG tablet; Take 1 tablet (12.5 mg total) by mouth daily.  Dispense: 30 tablet; Refill: 6 - Basic Metabolic Panel; Future  3. Muscle spasms of lower extremity, unspecified laterality - tiZANidine (ZANAFLEX) 4 MG tablet; Take 1 tablet (4 mg total) by mouth every 8 (eight) hours as needed for muscle spasms.  Dispense: 90 tablet; Refill: 2  4. Hair changes -Hirsutism likely due to PCOS.  Discussed options including laser, shaving, BCP (which is not a good option for her), Spironolactone. She will continue shaving at this time   5. Ovarian mass Being evaluated by GYN. In terms of the AUB since she is not a candidate for hysterectomy I advised her to asked the gynecologist about Implanon as a means to help decrease heavy periods  6. Insomnia, unspecified type We will not put her back on Ambien at this time.  Will restart amitriptyline instead - amitriptyline (ELAVIL) 25 MG tablet; Take 1 tablet (25 mg total) by mouth at bedtime.  Dispense: 30 tablet; Refill: 2  Patient was given the opportunity to ask questions.  Patient verbalized understanding of the plan and was able to repeat key elements of the plan.   Orders Placed This Encounter  Procedures  . Basic Metabolic Panel  . POCT glucose (manual entry)  . POCT glycosylated hemoglobin (Hb A1C)  . POCT glucose (manual entry)     Requested Prescriptions   Signed Prescriptions Disp Refills  . amitriptyline (ELAVIL) 25 MG tablet 30 tablet 2    Sig: Take 1 tablet (25 mg total) by mouth at bedtime.  . hydrochlorothiazide (HYDRODIURIL) 12.5 MG tablet 30 tablet 6    Sig: Take 1 tablet (12.5 mg total) by mouth daily.  Marland Kitchen tiZANidine (ZANAFLEX) 4 MG tablet 90 tablet 2    Sig: Take 1 tablet (4 mg total) by mouth every 8 (eight) hours as needed for muscle spasms.  . insulin glargine (LANTUS) 100 UNIT/ML injection 10 mL 11    Sig: Inject 0.34  mLs (34 Units total) into the skin at bedtime.    Return in about  3 months (around 10/30/2017).  Karle Plumber, MD, FACP

## 2017-08-01 NOTE — Patient Instructions (Signed)
Increase Lantus to 34 units daily. Increase to 37 units if morning blood sugars remain higher than 150 after 3 days.  Work on Improving eating habits.  Restart Amitriptyline 25 mg at bedtime.  Stop Amlodipine.  Start Hydrochlorothiazide instead. Please return to the lab in 1 week for potassium recheck.

## 2017-08-17 ENCOUNTER — Encounter: Payer: Self-pay | Admitting: Internal Medicine

## 2017-08-19 ENCOUNTER — Encounter: Payer: Self-pay | Admitting: Internal Medicine

## 2017-08-19 ENCOUNTER — Ambulatory Visit: Payer: Medicaid Other | Attending: Internal Medicine | Admitting: Internal Medicine

## 2017-08-19 VITALS — BP 158/89 | HR 86 | Temp 98.9°F | Resp 16 | Wt 243.2 lb

## 2017-08-19 DIAGNOSIS — Z79899 Other long term (current) drug therapy: Secondary | ICD-10-CM | POA: Diagnosis not present

## 2017-08-19 DIAGNOSIS — J069 Acute upper respiratory infection, unspecified: Secondary | ICD-10-CM

## 2017-08-19 DIAGNOSIS — K219 Gastro-esophageal reflux disease without esophagitis: Secondary | ICD-10-CM | POA: Insufficient documentation

## 2017-08-19 DIAGNOSIS — I10 Essential (primary) hypertension: Secondary | ICD-10-CM | POA: Diagnosis not present

## 2017-08-19 DIAGNOSIS — Z888 Allergy status to other drugs, medicaments and biological substances status: Secondary | ICD-10-CM | POA: Diagnosis not present

## 2017-08-19 DIAGNOSIS — E119 Type 2 diabetes mellitus without complications: Secondary | ICD-10-CM | POA: Insufficient documentation

## 2017-08-19 DIAGNOSIS — Z794 Long term (current) use of insulin: Secondary | ICD-10-CM | POA: Insufficient documentation

## 2017-08-19 DIAGNOSIS — E785 Hyperlipidemia, unspecified: Secondary | ICD-10-CM | POA: Diagnosis not present

## 2017-08-19 DIAGNOSIS — J029 Acute pharyngitis, unspecified: Secondary | ICD-10-CM | POA: Diagnosis not present

## 2017-08-19 DIAGNOSIS — M1990 Unspecified osteoarthritis, unspecified site: Secondary | ICD-10-CM | POA: Insufficient documentation

## 2017-08-19 DIAGNOSIS — Z981 Arthrodesis status: Secondary | ICD-10-CM | POA: Insufficient documentation

## 2017-08-19 DIAGNOSIS — G4730 Sleep apnea, unspecified: Secondary | ICD-10-CM | POA: Diagnosis not present

## 2017-08-19 LAB — POCT RAPID STREP A (OFFICE): Rapid Strep A Screen: NEGATIVE

## 2017-08-19 MED ORDER — FLUTICASONE PROPIONATE 50 MCG/ACT NA SUSP: 2.0000 | Freq: Every day | NASAL | 6 refills | Status: DC

## 2017-08-19 MED ORDER — BENZONATATE 100 MG PO CAPS: 100.0000 mg | ORAL_CAPSULE | Freq: Two times a day (BID) | ORAL | 0 refills | Status: DC | PRN

## 2017-08-19 NOTE — Patient Instructions (Signed)
Upper Respiratory Infection, Adult Most upper respiratory infections (URIs) are caused by a virus. A URI affects the nose, throat, and upper air passages. The most common type of URI is often called "the common cold." Follow these instructions at home:  Take medicines only as told by your doctor.  Gargle warm saltwater or take cough drops to comfort your throat as told by your doctor.  Use a warm mist humidifier or inhale steam from a shower to increase air moisture. This may make it easier to breathe.  Drink enough fluid to keep your pee (urine) clear or pale yellow.  Eat soups and other clear broths.  Have a healthy diet.  Rest as needed.  Go back to work when your fever is gone or your doctor says it is okay. ? You may need to stay home longer to avoid giving your URI to others. ? You can also wear a face mask and wash your hands often to prevent spread of the virus.  Use your inhaler more if you have asthma.  Do not use any tobacco products, including cigarettes, chewing tobacco, or electronic cigarettes. If you need help quitting, ask your doctor. Contact a doctor if:  You are getting worse, not better.  Your symptoms are not helped by medicine.  You have chills.  You are getting more short of breath.  You have brown or red mucus.  You have yellow or brown discharge from your nose.  You have pain in your face, especially when you bend forward.  You have a fever.  You have puffy (swollen) neck glands.  You have pain while swallowing.  You have white areas in the back of your throat. Get help right away if:  You have very bad or constant: ? Headache. ? Ear pain. ? Pain in your forehead, behind your eyes, and over your cheekbones (sinus pain). ? Chest pain.  You have long-lasting (chronic) lung disease and any of the following: ? Wheezing. ? Long-lasting cough. ? Coughing up blood. ? A change in your usual mucus.  You have a stiff neck.  You have  changes in your: ? Vision. ? Hearing. ? Thinking. ? Mood. This information is not intended to replace advice given to you by your health care provider. Make sure you discuss any questions you have with your health care provider. Document Released: 01/01/2008 Document Revised: 03/17/2016 Document Reviewed: 10/20/2013 Elsevier Interactive Patient Education  2018 [employer]  

## 2017-08-19 NOTE — Progress Notes (Signed)
Subjective:  Patient ID: Briana Williams, female    DOB: [birth year]  Age: 37 y.o. MRN: [id number]  CC: Sore Throat   HPI Briana Williams presents for is a 37 y/o Caucasian female with medical history of HTN and DM type 2. She presents today with complaints of sore throat and sinus congestion.   URI: Patient c/o 2-3 day onset of cough, rhinorrhea, sinus congestion, headache, and sore throat. Per patient, she has had green colored sputum production. Denies fever; however, reports intermittent chills. Denies night sweats, post-nasal drip, otalgia, reduced appetite, or inability to sleep. Patient has been taking over the counter Tylenol sinus congestion for the last 2 days with minimal relief. Reports increased swelling in bilaterally lower extremities over the last 2 days. Denies chest pain, dizziness, palpitations, sob, wheezing, orthopnea, or paroxsymal dyspnea. Denies recent sick contact.   Past Medical History:  Diagnosis Date  . Arthritis   . GERD (gastroesophageal reflux disease)   . Headache    migraine 2 weeks ago  . Hyperlipidemia   . Hypertension associated with diabetes ([place])   . Renal disorder    has both kidneys, compacted and 'squashed together"  . Scoliosis   . Sleep apnea    in the midst of being tested for it.   waiting on scheduling   . Type 2 diabetes mellitus treated with insulin ([place])    dx 2000   Patient Active Problem List   Diagnosis Date Noted  . Muscle spasm of back 04/24/2017  . Osteoarthritis of both hips 01/15/2017  . Recurrent headache 01/06/2017  . Irregular menstrual cycle 05/30/2016  . Chronic pain syndrome 09/21/2015  . HYPERTRIGLYCERIDEMIA 05/02/2009  . 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 Insomnia 05/01/2009  . SCOLIOSIS 05/18/2008  . Diabetes type 2, uncontrolled ([place]) 04/18/2008  . DEPRESSION 04/18/2008  . Essential hypertension, benign 04/18/2008   Past Surgical History:  Procedure Laterality Date  . BACK SURGERY    . CERVICAL  FUSION    . frontal closed sutures to forehead    . Harrington Rod placement    . HIP SURGERY     staph infection  . LUMBAR FUSION    . RADIOLOGY WITH ANESTHESIA N/A 02/25/2017   Procedure: RADIOLOGY WITH ANESTHESIA/MRI THORACIC SPINE AND LEFT SHOULDER WITHOUT CONTRAST.;  Surgeon: Radiologist, Medication, MD;  Location: [place];  Service: Radiology;  Laterality: N/A;   Social History   Socioeconomic History  . Marital status: Married    Spouse name: Not on file  . Number of children: 3  . Years of education: Not on file  . Highest education level: Not on file  Social Needs  . Financial resource strain: Not on file  . Food insecurity - worry: Not on file  . Food insecurity - inability: Not on file  . Transportation needs - medical: Not on file  . Transportation needs - non-medical: Not on file  Occupational History    Employer: UNEMPLOYED  Tobacco Use  . Smoking status: Never Smoker  . Smokeless tobacco: Never Used  Substance and Sexual Activity  . Alcohol use: No  . Drug use: No  . Sexual activity: Yes    Birth control/protection: Other-see comments    Comment: unable to have children d/t scoliosis  Other Topics Concern  . Not on file  Social History Narrative  . Not on file   Outpatient Medications Prior to Visit  Medication Sig Dispense Refill  . ACCU-CHEK SOFTCLIX LANCETS lancets 1 each by Other route 3 (three)  times daily. ICD 10 E11.9 100 each 12  . albuterol (PROVENTIL HFA;VENTOLIN HFA) 108 (90 Base) MCG/ACT inhaler Inhale 1 puff into the lungs every 6 (six) hours as needed for wheezing or shortness of breath. 8 g 3  . amitriptyline (ELAVIL) 25 MG tablet Take 1 tablet (25 mg total) by mouth at bedtime. 30 tablet 2  . Blood Glucose Monitoring Suppl (ACCU-CHEK AVIVA PLUS) w/Device KIT 1 Device by Does not apply route 3 (three) times daily after meals. ICD 10 E11.9 1 kit 0  . BYETTA 5 MCG PEN 5 MCG/0.02ML SOPN injection INJECT 5 MCG UNDER THE SKIN TWICE DAILY WITH A MEAL  1.2 mL 2  . diclofenac (VOLTAREN) 75 MG EC tablet TAKE 1 TABLET(75 MG) BY MOUTH TWICE DAILY 60 tablet 0  . escitalopram (LEXAPRO) 10 MG tablet Take 1 tablet (10 mg total) by mouth daily. 90 tablet 1  . gabapentin (NEURONTIN) 100 MG capsule TAKE 3 CAPSULES BY MOUTH THREE TIMES DAILY 270 capsule 6  . glucose blood (ACCU-CHEK AVIVA PLUS) test strip 1 each by Other route 3 (three) times daily. ICD 10 E11.9 100 each 12  . hydrochlorothiazide (HYDRODIURIL) 12.5 MG tablet Take 1 tablet (12.5 mg total) by mouth daily. 30 tablet 6  . ibuprofen (ADVIL,MOTRIN) 200 MG tablet Take 600 mg by mouth every 8 (eight) hours as needed for mild pain.    Marland Kitchen insulin aspart (NOVOLOG) 100 UNIT/ML injection 12-20 per sliding scale 1 vial 5  . insulin glargine (LANTUS) 100 UNIT/ML injection Inject 0.34 mLs (34 Units total) into the skin at bedtime. 10 mL 11  . Insulin Pen Needle (B-D ULTRAFINE III SHORT PEN) 31G X 8 MM MISC 1 application by Does not apply route daily. 60 each 5  . Lancet Devices (ACCU-CHEK SOFTCLIX) lancets 1 each by Other route 3 (three) times daily. ICD 10 E11.9 1 each 0  . lisinopril (PRINIVIL,ZESTRIL) 40 MG tablet Take 1 tablet (40 mg total) by mouth daily. 90 tablet 1  . pantoprazole (PROTONIX) 40 MG tablet Take 1 tablet (40 mg total) by mouth daily. 30 tablet 0  . sitaGLIPtin (JANUVIA) 50 MG tablet Take 1 tablet (50 mg total) by mouth daily. 30 tablet 3  . SUMAtriptan (IMITREX) 25 MG tablet Take 1-2 tabs at start of headache. May repeat in 2 hrs if no relief. Max 4 tabs/24 hr. 10 tablet 1  . tiZANidine (ZANAFLEX) 4 MG tablet Take 1 tablet (4 mg total) by mouth every 8 (eight) hours as needed for muscle spasms. 90 tablet 2   No facility-administered medications prior to visit.    Allergies  Allergen Reactions  . Metformin And Related Diarrhea  . Ultrasound Cream Rash    unknown  . Ultrasound Gel Rash    Patient gets a red rash with use of ultrasound gel   Rapid strep test is  Negative.  ROS Review of Systems  Constitutional: Positive for chills. Negative for activity change, appetite change, diaphoresis, fatigue and fever.  HENT: Positive for congestion, rhinorrhea, sinus pressure, sinus pain and sore throat. Negative for ear pain, facial swelling, postnasal drip, trouble swallowing and voice change.   Respiratory: Positive for cough. Negative for choking, chest tightness, shortness of breath, wheezing and stridor.   Cardiovascular: Positive for chest pain (mild chest discomfort with cough) and leg swelling. Negative for palpitations.  Gastrointestinal: Negative for abdominal distention, abdominal pain, nausea and vomiting.  Musculoskeletal: Negative for arthralgias, back pain, neck pain and neck stiffness.  Skin: Negative for color  change and rash.  Neurological: Positive for headaches. Negative for dizziness, weakness and light-headedness.  Psychiatric/Behavioral: Negative for sleep disturbance.  All other systems reviewed and are negative.   Objective:  BP (!) 158/89   Pulse 86   Temp 98.9 F (37.2 C) (Oral)   Resp 16   Wt 243 lb 3.2 oz (110.3 kg)   SpO2 97%   BMI 53.56 kg/m   BP/Weight 08/19/2017 08/01/2017 05/18/1172  Systolic BP 567 014 103  Diastolic BP 89 82 54  Wt. (Lbs) 243.2 243.4 221  BMI 53.56 53.61 48.67   Physical Exam  Constitutional: She is oriented to person, place, and time. She appears well-developed and well-nourished. No distress.  HENT:  Head: Normocephalic and atraumatic.  Right Ear: Tympanic membrane, external ear and ear canal normal. No tenderness.  Left Ear: Tympanic membrane, external ear and ear canal normal. No tenderness.  Nose: Rhinorrhea present. No mucosal edema. Right sinus exhibits maxillary sinus tenderness. Left sinus exhibits maxillary sinus tenderness.  Mouth/Throat: Uvula is midline, oropharynx is clear and moist and mucous membranes are normal. No oropharyngeal exudate.  Post nasal drip noted. Negative for  exudates.   Eyes: Conjunctivae and EOM are normal.  Neck: Normal range of motion. Neck supple.  Cardiovascular: Normal heart sounds. Exam reveals no gallop and no friction rub.  No murmur heard. Pulses:      Radial pulses are 2+ on the right side, and 2+ on the left side.  Non-pitting edema present.   Pulmonary/Chest: Effort normal and breath sounds normal. No respiratory distress. She has no wheezes. She has no rales.  Negative for bronchophony and egophony. Negative for tactile fremitus.  Abdominal: Soft. Bowel sounds are normal. She exhibits no distension. There is no tenderness.  Musculoskeletal: Normal range of motion. She exhibits no tenderness.  Lymphadenopathy:    She has no cervical adenopathy.  Negative head adenopathy  Neurological: She is alert and oriented to person, place, and time.  Skin: Skin is warm and dry.  Psychiatric: She has a normal mood and affect. Her behavior is normal. Judgment and thought content normal.  Nursing note and vitals reviewed.  Assessment & Plan:   1. Upper respiratory infection, viral - Instructed to note use over the counter decongestant as a risk for increased blood pressure and swelling. Instructed to use Coricidin over the counter in the future for cold-like symptoms.  - Provided education about a neti pot for nasal clearing.  - Ordered Flonase for nasal/sinus congestion. - Ordered Tessalon pearls for cough suppressant.   2. Sore throat - Rapid strep was negative.  - Rapid Strep A   Meds ordered this encounter  Medications  . benzonatate (TESSALON) 100 MG capsule    Sig: Take 1 capsule (100 mg total) by mouth 2 (two) times daily as needed for cough.    Dispense:  20 capsule    Refill:  0  . fluticasone (FLONASE) 50 MCG/ACT nasal spray    Sig: Place 2 sprays into both nostrils daily.    Dispense:  16 g    Refill:  6    Follow-up: Return if symptoms worsen or fail to improve.   Hesston Hitchens H. Hulan Fray, DNP-Student

## 2017-08-19 NOTE — Progress Notes (Signed)
Pt states she has a sore throat  Pt states she has been coughing a lot to where is chest starts to hurt  Pt states she is having head congestion  Pt states she has leg edema

## 2017-08-20 ENCOUNTER — Ambulatory Visit: Payer: Medicaid Other | Admitting: Internal Medicine

## 2017-08-23 ENCOUNTER — Other Ambulatory Visit: Payer: Self-pay | Admitting: Internal Medicine

## 2017-08-31 ENCOUNTER — Other Ambulatory Visit (INDEPENDENT_AMBULATORY_CARE_PROVIDER_SITE_OTHER): Payer: Self-pay | Admitting: Specialist

## 2017-08-31 ENCOUNTER — Other Ambulatory Visit: Payer: Self-pay | Admitting: Internal Medicine

## 2017-08-31 DIAGNOSIS — F419 Anxiety disorder, unspecified: Secondary | ICD-10-CM

## 2017-09-01 ENCOUNTER — Other Ambulatory Visit (INDEPENDENT_AMBULATORY_CARE_PROVIDER_SITE_OTHER): Payer: Self-pay | Admitting: Specialist

## 2017-09-01 NOTE — Telephone Encounter (Signed)
Diclofenac Refill request

## 2017-09-01 NOTE — Telephone Encounter (Signed)
Diclofenac Refill Request

## 2017-09-23 ENCOUNTER — Other Ambulatory Visit (INDEPENDENT_AMBULATORY_CARE_PROVIDER_SITE_OTHER): Payer: Self-pay | Admitting: Specialist

## 2017-09-24 NOTE — Telephone Encounter (Signed)
Diclofenac refill request 

## 2017-10-06 ENCOUNTER — Other Ambulatory Visit: Payer: Self-pay | Admitting: Internal Medicine

## 2017-10-20 ENCOUNTER — Other Ambulatory Visit: Payer: Self-pay | Admitting: Internal Medicine

## 2017-10-20 ENCOUNTER — Other Ambulatory Visit: Payer: Self-pay | Admitting: Pharmacist

## 2017-10-20 DIAGNOSIS — G4700 Insomnia, unspecified: Secondary | ICD-10-CM

## 2017-10-20 MED ORDER — AMITRIPTYLINE HCL 25 MG PO TABS: 25.0000 mg | ORAL_TABLET | Freq: Every day | ORAL | 0 refills | Status: DC

## 2017-10-30 ENCOUNTER — Encounter: Payer: Self-pay | Admitting: Internal Medicine

## 2017-10-30 ENCOUNTER — Ambulatory Visit: Payer: Medicaid Other | Attending: Internal Medicine | Admitting: Internal Medicine

## 2017-10-30 VITALS — BP 169/96 | HR 96 | Temp 98.7°F | Resp 16 | Wt 235.8 lb

## 2017-10-30 DIAGNOSIS — G894 Chronic pain syndrome: Secondary | ICD-10-CM | POA: Insufficient documentation

## 2017-10-30 DIAGNOSIS — N838 Other noninflammatory disorders of ovary, fallopian tube and broad ligament: Secondary | ICD-10-CM

## 2017-10-30 DIAGNOSIS — M419 Scoliosis, unspecified: Secondary | ICD-10-CM | POA: Insufficient documentation

## 2017-10-30 DIAGNOSIS — IMO0001 Reserved for inherently not codable concepts without codable children: Secondary | ICD-10-CM

## 2017-10-30 DIAGNOSIS — M160 Bilateral primary osteoarthritis of hip: Secondary | ICD-10-CM | POA: Diagnosis not present

## 2017-10-30 DIAGNOSIS — N926 Irregular menstruation, unspecified: Secondary | ICD-10-CM | POA: Diagnosis not present

## 2017-10-30 DIAGNOSIS — Z79899 Other long term (current) drug therapy: Secondary | ICD-10-CM | POA: Insufficient documentation

## 2017-10-30 DIAGNOSIS — G43909 Migraine, unspecified, not intractable, without status migrainosus: Secondary | ICD-10-CM | POA: Insufficient documentation

## 2017-10-30 DIAGNOSIS — E1165 Type 2 diabetes mellitus with hyperglycemia: Secondary | ICD-10-CM | POA: Diagnosis not present

## 2017-10-30 DIAGNOSIS — F32A Depression, unspecified: Secondary | ICD-10-CM

## 2017-10-30 DIAGNOSIS — I10 Essential (primary) hypertension: Secondary | ICD-10-CM | POA: Diagnosis not present

## 2017-10-30 DIAGNOSIS — F419 Anxiety disorder, unspecified: Secondary | ICD-10-CM

## 2017-10-30 DIAGNOSIS — G4700 Insomnia, unspecified: Secondary | ICD-10-CM | POA: Insufficient documentation

## 2017-10-30 DIAGNOSIS — N839 Noninflammatory disorder of ovary, fallopian tube and broad ligament, unspecified: Secondary | ICD-10-CM

## 2017-10-30 DIAGNOSIS — E119 Type 2 diabetes mellitus without complications: Secondary | ICD-10-CM | POA: Diagnosis present

## 2017-10-30 DIAGNOSIS — F329 Major depressive disorder, single episode, unspecified: Secondary | ICD-10-CM | POA: Diagnosis not present

## 2017-10-30 DIAGNOSIS — Z8249 Family history of ischemic heart disease and other diseases of the circulatory system: Secondary | ICD-10-CM | POA: Insufficient documentation

## 2017-10-30 DIAGNOSIS — N949 Unspecified condition associated with female genital organs and menstrual cycle: Secondary | ICD-10-CM | POA: Diagnosis not present

## 2017-10-30 DIAGNOSIS — Z794 Long term (current) use of insulin: Secondary | ICD-10-CM | POA: Diagnosis not present

## 2017-10-30 LAB — GLUCOSE, POCT (MANUAL RESULT ENTRY): POC Glucose: 197 mg/dl — AB (ref 70–99)

## 2017-10-30 LAB — POCT GLYCOSYLATED HEMOGLOBIN (HGB A1C): Hemoglobin A1C: 10.3

## 2017-10-30 MED ORDER — INSULIN ASPART 100 UNIT/ML FLEXPEN: PEN_INJECTOR | SUBCUTANEOUS | 11 refills | Status: DC

## 2017-10-30 MED ORDER — INSULIN GLARGINE 100 UNIT/ML SOLOSTAR PEN: 38.0000 [IU] | PEN_INJECTOR | Freq: Every day | SUBCUTANEOUS | 11 refills | Status: DC

## 2017-10-30 MED ORDER — ESCITALOPRAM OXALATE 20 MG PO TABS: 20.0000 mg | ORAL_TABLET | Freq: Every day | ORAL | 1 refills | Status: DC

## 2017-10-30 MED FILL — LANTUS SOLOSTAR 100 UNITS/M: 100 | 30 days supply | Qty: 12 | Fill #0

## 2017-10-30 MED FILL — NOVOLOG FLEXPEN SYRINGE: 100 | 30 days supply | Qty: 9 | Fill #0

## 2017-10-30 NOTE — Patient Instructions (Signed)
Increase Lantus to 38 units daily. Increase NovoLog to 15 units twice a day with the 2 largest meals of the day. Cut back on white carbohydrates. Increase Lexapro to 20 mg daily. Check blood pressure 2-3 times a week and record readings. Please stop at the lab for blood test today.

## 2017-10-30 NOTE — Progress Notes (Signed)
Patient ID: Briana Williams, female    DOB: 1980/08/08  MRN: [id number]  CC: Diabetes and Hypertension   Subjective: Briana Williams is a 37 y.o. female who presents for chronic ds management. Her concerns today include:  Hx of scoliosis(multiple back surgeries), HTN, insomnia, DM2, depression, migraines. OA hips seen on MRI  1.  DM: Med - admits that she was not taking her medications consistently up until about a month ago.  Prior to this she was taking her medicines every other day.  Part of the reason she states is that she does not like sticking herself.  She requests change of Lantus and NovoLog to the pens.  Has sliding scale of NovoLog but reports that on average she takes 12 units twice a day with breakfast and dinner  BS: not checking consistently.  Under 220 Diet: loves pasta Not as much exercise due to hips   2. [facility] Specialist: Seeing Dr. Feliz Beam.  Will have an infusion of 3 meds on Monday.  Effects should last a mth.    3.  HTN: taking lisinopril and HCTZ consistently.   Limits salt  4 GYN:  Never had the pelvic MRI done as was ordered by gynecology.  She states she was told by the schedulers that [facility] does not take Medicaid any more.  Went to [place] to have it done but she was claustaphobic  5.  Dep:  Having "more blah days" recently.   Mom has been in and out of hosp x 2 mths.  This has created stress and anxiety for her Sees a counselor through her pain specialist.  She is pleased with her counselor.  The counselor had recommended speaking to me about possibly increasing the Lexapro or adding Xanax  Patient Active Problem List   Diagnosis Date Noted  . Muscle spasm of back 04/24/2017  . Osteoarthritis of both hips 01/15/2017  . Recurrent headache 01/06/2017  . Irregular menstrual cycle 05/30/2016  . Chronic pain syndrome 09/21/2015  . HYPERTRIGLYCERIDEMIA 05/02/2009  . 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 Insomnia 05/01/2009  . SCOLIOSIS  05/18/2008  . Diabetes type 2, uncontrolled ([place]) 04/18/2008  . DEPRESSION 04/18/2008  . Essential hypertension, benign 04/18/2008     Current Outpatient Medications on File Prior to Visit  Medication Sig Dispense Refill  . ACCU-CHEK SOFTCLIX LANCETS lancets 1 each by Other route 3 (three) times daily. ICD 10 E11.9 100 each 12  . albuterol (PROVENTIL HFA;VENTOLIN HFA) 108 (90 Base) MCG/ACT inhaler Inhale 1 puff into the lungs every 6 (six) hours as needed for wheezing or shortness of breath. 8 g 3  . amitriptyline (ELAVIL) 25 MG tablet Take 1 tablet (25 mg total) by mouth at bedtime. 30 tablet 0  . Blood Glucose Monitoring Suppl (ACCU-CHEK AVIVA PLUS) w/Device KIT 1 Device by Does not apply route 3 (three) times daily after meals. ICD 10 E11.9 1 kit 0  . BYETTA 5 MCG PEN 5 MCG/0.02ML SOPN injection ADMINISTER 5 MCG UNDER THE SKIN TWICE DAILY WITH A MEAL 1.2 mL 0  . diclofenac (VOLTAREN) 75 MG EC tablet TAKE 1 TABLET(75 MG) BY MOUTH TWICE DAILY 60 tablet 0  . diclofenac (VOLTAREN) 75 MG EC tablet TAKE 1 TABLET(75 MG) BY MOUTH TWICE DAILY 60 tablet 0  . fluticasone (FLONASE) 50 MCG/ACT nasal spray Place 2 sprays into both nostrils daily. 16 g 6  . gabapentin (NEURONTIN) 100 MG capsule TAKE 3 CAPSULES BY MOUTH THREE TIMES DAILY 270 capsule 6  .  glucose blood (ACCU-CHEK AVIVA PLUS) test strip 1 each by Other route 3 (three) times daily. ICD 10 E11.9 100 each 12  . hydrochlorothiazide (HYDRODIURIL) 12.5 MG tablet Take 1 tablet (12.5 mg total) by mouth daily. 30 tablet 6  . ibuprofen (ADVIL,MOTRIN) 200 MG tablet Take 600 mg by mouth every 8 (eight) hours as needed for mild pain.    . Insulin Pen Needle (B-D ULTRAFINE III SHORT PEN) 31G X 8 MM MISC 1 application by Does not apply route daily. 60 each 5  . Lancet Devices (ACCU-CHEK SOFTCLIX) lancets 1 each by Other route 3 (three) times daily. ICD 10 E11.9 1 each 0  . lisinopril (PRINIVIL,ZESTRIL) 40 MG tablet Take 1 tablet (40 mg total) by mouth  daily. 90 tablet 1  . pantoprazole (PROTONIX) 40 MG tablet Take 1 tablet (40 mg total) by mouth daily. 30 tablet 0  . sitaGLIPtin (JANUVIA) 50 MG tablet Take 1 tablet (50 mg total) by mouth daily. 30 tablet 3  . SUMAtriptan (IMITREX) 25 MG tablet Take 1-2 tabs at start of headache. May repeat in 2 hrs if no relief. Max 4 tabs/24 hr. 10 tablet 1  . tiZANidine (ZANAFLEX) 4 MG tablet Take 1 tablet (4 mg total) by mouth every 8 (eight) hours as needed for muscle spasms. 90 tablet 2   No current facility-administered medications on file prior to visit.     Allergies  Allergen Reactions  . Metformin And Related Diarrhea  . Norvasc [Amlodipine Besylate]     Ankle swelling  . Ultrasound Cream Rash    unknown  . Ultrasound Gel Rash    Patient gets a red rash with use of ultrasound gel    Social History   Socioeconomic History  . Marital status: Married    Spouse name: Not on file  . Number of children: 3  . Years of education: Not on file  . Highest education level: Not on file  Occupational History    Employer: UNEMPLOYED  Social Needs  . Financial resource strain: Not on file  . Food insecurity:    Worry: Not on file    Inability: Not on file  . Transportation needs:    Medical: Not on file    Non-medical: Not on file  Tobacco Use  . Smoking status: Never Smoker  . Smokeless tobacco: Never Used  Substance and Sexual Activity  . Alcohol use: No  . Drug use: No  . Sexual activity: Yes    Birth control/protection: Other-see comments    Comment: unable to have children d/t scoliosis  Lifestyle  . Physical activity:    Days per week: Not on file    Minutes per session: Not on file  . Stress: Not on file  Relationships  . Social connections:    Talks on phone: Not on file    Gets together: Not on file    Attends religious service: Not on file    Active member of club or organization: Not on file    Attends meetings of clubs or organizations: Not on file    Relationship  status: Not on file  . Intimate partner violence:    Fear of current or ex partner: Not on file    Emotionally abused: Not on file    Physically abused: Not on file    Forced sexual activity: Not on file  Other Topics Concern  . Not on file  Social History Narrative  . Not on file    Family History  Problem Relation Age of Onset  . Cancer Maternal Grandmother        mandibular cancer   . Diabetes Mother   . Heart failure Mother   . CAD Mother        triple CABG  . Diabetes Maternal Aunt   . Stroke Father     Past Surgical History:  Procedure Laterality Date  . BACK SURGERY    . CERVICAL FUSION    . frontal closed sutures to forehead    . Harrington Rod placement    . HIP SURGERY     staph infection  . LUMBAR FUSION    . RADIOLOGY WITH ANESTHESIA N/A 02/25/2017   Procedure: RADIOLOGY WITH ANESTHESIA/MRI THORACIC SPINE AND LEFT SHOULDER WITHOUT CONTRAST.;  Surgeon: Radiologist, Medication, MD;  Location: [place];  Service: Radiology;  Laterality: N/A;    ROS: Review of Systems Negative except as stated above PHYSICAL EXAM: BP (!) 169/96   Pulse 96   Temp 98.7 F (37.1 C) (Oral)   Resp 16   Wt 235 lb 12.8 oz (107 kg)   SpO2 98%   BMI 51.93 kg/m   Wt Readings from Last 3 Encounters:  10/30/17 235 lb 12.8 oz (107 kg)  08/19/17 243 lb 3.2 oz (110.3 kg)  08/01/17 243 lb 6.4 oz (110.4 kg)   Repeat BP 142/88 Physical Exam  General appearance - alert, well appearing, and in no distress Mental status - alert, oriented to person, place, and time, normal mood, behavior, speech, dress, motor activity, and thought processes Mouth - mucous membranes moist, pharynx normal without lesions Neck - supple, no significant adenopathy Chest - clear to auscultation, no wheezes, rales or rhonchi, symmetric air entry Heart - normal rate, regular rhythm, normal S1, S2, no murmurs, rubs, clicks or gallops Extremities - peripheral pulses normal, no pedal edema, no clubbing or  cyanosis Diabetic Foot Exam - Simple   Simple Foot Form Visual Inspection No deformities, no ulcerations, no other skin breakdown bilaterally:  Yes Sensation Testing Intact to touch and monofilament testing bilaterally:  Yes Pulse Check Posterior Tibialis and Dorsalis pulse intact bilaterally:  Yes Comments     BS 197/A1C 10.3  ASSESSMENT AND PLAN: 1. Uncontrolled type 2 diabetes mellitus without complication, with long-term current use of insulin (HCC) Encourage compliance with medications.  Dietary counseling given. Change to Lantus and NovoLog pens.  Increase Lantus from 34 units to 38 units.  Change NovoLog to 15 units with the 2 largest meals of the day. - POCT glucose (manual entry) - POCT glycosylated hemoglobin (Hb A1C) - Insulin Glargine (LANTUS SOLOSTAR) 100 UNIT/ML Solostar Pen; Inject 38 Units into the skin daily at 10 pm.  Dispense: 15 mL; Refill: 11 - insulin aspart (NOVOLOG FLEXPEN) 100 UNIT/ML FlexPen; 15 units subcut with the two largest meals of the day  Dispense: 15 mL; Refill: 11  2. Ovarian mass -We will try to reschedule the MRI.  If [facility] imaging does not take Medicaid that she is agreeable to having it done at [facility] but we will give a dose of Valium to take prior to the procedure - MR Pelvis W Wo Contrast; Future  3. Essential hypertension Repeat blood pressure better but not at goal.  Encourage patient to check blood pressure at home at least about 2-3 times a week and bring in readings on next visit.  Goal is 130/80 or lower - Basic Metabolic Panel  4. Anxiety and depression Inc Lexapro to 20 mg daily -  escitalopram (LEXAPRO) 20 MG tablet; Take 1 tablet (20 mg total) by mouth daily.  Dispense: 90 tablet; Refill: 1  5. Unspecified condition associated with female genital organs and menstrual cycle - MR Pelvis W Wo Contrast; Future   Patient was given the opportunity to ask questions.  Patient verbalized understanding of the plan and  was able to repeat key elements of the plan.   Orders Placed This Encounter  Procedures  . MR Pelvis W Wo Contrast  . POCT glucose (manual entry)  . POCT glycosylated hemoglobin (Hb A1C)     Requested Prescriptions   Signed Prescriptions Disp Refills  . Insulin Glargine (LANTUS SOLOSTAR) 100 UNIT/ML Solostar Pen 15 mL 11    Sig: Inject 38 Units into the skin daily at 10 pm.  . insulin aspart (NOVOLOG FLEXPEN) 100 UNIT/ML FlexPen 15 mL 11    Sig: 15 units subcut with the two largest meals of the day  . escitalopram (LEXAPRO) 20 MG tablet 90 tablet 1    Sig: Take 1 tablet (20 mg total) by mouth daily.    Return in about 3 months (around 01/29/2018).  Karle Plumber, MD, FACP

## 2017-10-31 ENCOUNTER — Encounter: Payer: Self-pay | Admitting: Internal Medicine

## 2017-10-31 LAB — BASIC METABOLIC PANEL
BUN/Creatinine Ratio: 23 (ref 9–23)
BUN: 17 mg/dL (ref 6–20)
CO2: 21 mmol/L (ref 20–29)
CREATININE: 0.74 mg/dL (ref 0.57–1.00)
Calcium: 9.6 mg/dL (ref 8.7–10.2)
Chloride: 100 mmol/L (ref 96–106)
GFR, EST AFRICAN AMERICAN: 120 mL/min/{1.73_m2} (ref >59)
GFR, EST NON AFRICAN AMERICAN: 104 mL/min/{1.73_m2} (ref >59)
Glucose: 169 mg/dL — ABNORMAL HIGH (ref 65–99)
Potassium: 4.1 mmol/L (ref 3.5–5.2)
SODIUM: 137 mmol/L (ref 134–144)

## 2017-11-03 ENCOUNTER — Telehealth: Payer: Self-pay

## 2017-11-03 NOTE — Telephone Encounter (Signed)
Contacted [employer] and started prior auth for MRI pelvis w/wo contrast  Spoke with [facility] intake representative  CPT Code: [id number]  DX Codes: N94.9 and N83.9  Case [id number]  Needed more clinical information was transferred to [facility]. Clinical Reviewer provided clinical information and now is in review will receive final decision by the end of tomorrow

## 2017-11-04 ENCOUNTER — Other Ambulatory Visit: Payer: Self-pay | Admitting: Family Medicine

## 2017-11-04 MED ORDER — LORAZEPAM 0.5 MG PO TABS: ORAL_TABLET | ORAL | 0 refills | Status: DC

## 2017-11-06 ENCOUNTER — Ambulatory Visit (HOSPITAL_COMMUNITY)
Admission: RE | Admit: 2017-11-06 | Discharge: 2017-11-06 | Disposition: A | Discharge status: Home / Self Care | Payer: Medicaid Other | Source: Ambulatory Visit | Attending: Internal Medicine | Admitting: Internal Medicine

## 2017-11-06 DIAGNOSIS — N838 Other noninflammatory disorders of ovary, fallopian tube and broad ligament: Secondary | ICD-10-CM

## 2017-11-06 DIAGNOSIS — N949 Unspecified condition associated with female genital organs and menstrual cycle: Secondary | ICD-10-CM | POA: Diagnosis present

## 2017-11-06 DIAGNOSIS — N839 Noninflammatory disorder of ovary, fallopian tube and broad ligament, unspecified: Secondary | ICD-10-CM | POA: Diagnosis present

## 2017-11-06 DIAGNOSIS — N8301 Follicular cyst of right ovary: Secondary | ICD-10-CM | POA: Diagnosis not present

## 2017-11-06 MED ORDER — GADOBENATE DIMEGLUMINE 529 MG/ML IV SOLN
20.0000 mL | Freq: Once | INTRAVENOUS | Status: AC | PRN
  Administered 2017-11-06: 20 mL via INTRAVENOUS

## 2017-11-10 ENCOUNTER — Encounter: Payer: Self-pay | Admitting: Internal Medicine

## 2017-11-10 ENCOUNTER — Other Ambulatory Visit (INDEPENDENT_AMBULATORY_CARE_PROVIDER_SITE_OTHER): Payer: Self-pay | Admitting: Specialist

## 2017-11-10 ENCOUNTER — Other Ambulatory Visit: Payer: Self-pay | Admitting: Internal Medicine

## 2017-11-10 DIAGNOSIS — G4700 Insomnia, unspecified: Secondary | ICD-10-CM

## 2017-11-10 MED ORDER — AMITRIPTYLINE HCL 25 MG PO TABS: 25.0000 mg | ORAL_TABLET | Freq: Every day | ORAL | 6 refills | Status: DC

## 2017-11-10 NOTE — Telephone Encounter (Signed)
Diclofenac refill request 

## 2017-11-17 ENCOUNTER — Other Ambulatory Visit: Payer: Self-pay | Admitting: Pharmacist

## 2017-11-17 DIAGNOSIS — M62838 Other muscle spasm: Secondary | ICD-10-CM

## 2017-11-17 MED ORDER — TIZANIDINE HCL 4 MG PO TABS: 4.0000 mg | ORAL_TABLET | Freq: Three times a day (TID) | ORAL | 2 refills | Status: DC | PRN

## 2017-11-24 ENCOUNTER — Encounter (INDEPENDENT_AMBULATORY_CARE_PROVIDER_SITE_OTHER): Payer: Self-pay | Admitting: Orthopaedic Surgery

## 2017-11-25 ENCOUNTER — Other Ambulatory Visit (INDEPENDENT_AMBULATORY_CARE_PROVIDER_SITE_OTHER): Payer: Self-pay

## 2017-11-25 DIAGNOSIS — M25552 Pain in left hip: Secondary | ICD-10-CM

## 2017-12-02 ENCOUNTER — Encounter: Payer: Self-pay | Admitting: Internal Medicine

## 2017-12-02 ENCOUNTER — Other Ambulatory Visit (INDEPENDENT_AMBULATORY_CARE_PROVIDER_SITE_OTHER): Payer: Self-pay | Admitting: Specialist

## 2017-12-02 DIAGNOSIS — F32A Depression, unspecified: Secondary | ICD-10-CM

## 2017-12-02 DIAGNOSIS — F419 Anxiety disorder, unspecified: Principal | ICD-10-CM

## 2017-12-02 DIAGNOSIS — F329 Major depressive disorder, single episode, unspecified: Secondary | ICD-10-CM

## 2017-12-02 MED ORDER — ESCITALOPRAM OXALATE 20 MG PO TABS: 20.0000 mg | ORAL_TABLET | Freq: Every day | ORAL | 2 refills | Status: DC

## 2017-12-02 MED ORDER — DICLOFENAC SODIUM 75 MG PO TBEC: 75.0000 mg | DELAYED_RELEASE_TABLET | Freq: Two times a day (BID) | ORAL | 0 refills | Status: DC

## 2017-12-02 NOTE — Telephone Encounter (Signed)
CB PATIENT ?

## 2017-12-11 ENCOUNTER — Ambulatory Visit (INDEPENDENT_AMBULATORY_CARE_PROVIDER_SITE_OTHER): Payer: Medicaid Other | Admitting: Physical Medicine and Rehabilitation

## 2017-12-11 ENCOUNTER — Encounter (INDEPENDENT_AMBULATORY_CARE_PROVIDER_SITE_OTHER): Payer: Self-pay | Admitting: Physical Medicine and Rehabilitation

## 2017-12-11 ENCOUNTER — Ambulatory Visit (INDEPENDENT_AMBULATORY_CARE_PROVIDER_SITE_OTHER): Payer: Medicaid Other

## 2017-12-11 DIAGNOSIS — M25552 Pain in left hip: Secondary | ICD-10-CM | POA: Diagnosis not present

## 2017-12-11 NOTE — Progress Notes (Signed)
Numeric Pain Rating Scale and Functional Assessment Average Pain 9   In the last MONTH (on 0-10 scale) has pain interfered with the following?  1. General activity like being  able to carry out your everyday physical activities such as walking, climbing stairs, carrying groceries, or moving a chair?  Rating(7)   +Driver, -BT, -Dye Allergies. 

## 2017-12-11 NOTE — Patient Instructions (Signed)

## 2017-12-11 NOTE — Progress Notes (Signed)
Briana Williams - 37 y.o. female MRN [id number]  Date of birth: 1980/11/18  Office Visit Note: Visit Date: 12/11/2017 PCP: Ladell Pier, MD Referred by: Ladell Pier, MD  Subjective: Chief Complaint  Patient presents with  . Left Hip - Pain   HPI: Briana Williams is a 37 year old female that I have actually seen in the remote past with history of significant scoliosis and uncontrolled diabetes type 2.  She is been followed by Dr. Ninfa Linden for her hip.  Plan was to replace the hip but this was canceled to the patient's hemoglobin A1c.  Her last A1c was 10.  She has significant chronic pain is in chronic pain management.  We are going to complete a diagnostic and hopefully therapeutic anesthetic hip arthrogram on the left.  We discussed at length the diabetes management as well as the impact of the injection today with steroid.   ROS Otherwise per HPI.  Assessment & Plan: Visit Diagnoses:  1. Pain in left hip     Plan: Findings:  Diagnostic and hopefully therapeutic left intra-articular hip anesthetic arthrogram.  Patient did seem to have some relief during the anesthetic phase although she has significant pain complaints with her lumbar spine and her positions that she has to get in for relief  do to her spine.  She does state that she does feel like it feels some better during the anesthetic phase.  Injection itself was a little bit difficult to the body habitus.    Meds & Orders: No orders of the defined types were placed in this encounter.   Orders Placed This Encounter  Procedures  . Large Joint Inj: L hip joint  . XR C-ARM NO REPORT    Follow-up: Return if symptoms worsen or fail to improve, for Dr. Ninfa Linden.   Procedures: Large Joint Inj: L hip joint on 12/11/2017 3:01 PM Indications: pain and diagnostic evaluation Details: 22 G needle, anterior approach  Arthrogram: Yes  Medications: 3 mL bupivacaine 0.5 %; 80 mg triamcinolone acetonide 40  MG/ML Outcome: tolerated well, no immediate complications  Arthrogram demonstrated excellent flow of contrast throughout the joint surface without extravasation or obvious defect.  The patient had relief of symptoms during the anesthetic phase of the injection.  Procedure, treatment alternatives, risks and benefits explained, specific risks discussed. Consent was given by the patient. Immediately prior to procedure a time out was called to verify the correct patient, procedure, equipment, support staff and site/side marked as required. Patient was prepped and draped in the usual sterile fashion.      No notes on file   Clinical History: Electrodiagnostic study 09/07/2015 at [facility] Abnormal but incomplete study.    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     There is electrodiagnostic evidence of patchy sensory peripheral neuropathy  as can be seen in diabetes.   Clinical correlation is advised.  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   She reports that she has never smoked. She has never used smokeless tobacco.  Recent Labs    04/24/17 1401 08/01/17 1436 10/30/17 1456  HGBA1C 8.6 10.6 10.3    Objective:  VS:  HT:    WT:   BMI:     BP:   HR: bpm  TEMP: ( )  RESP:  Physical Exam  Musculoskeletal:  Patient is fairly obese with BMI of 51.9.  Patient does have pain with hip rotation on the left which is concordant for her current complaints.  She ambulates with a significant forward flexion of the lumbar  spine with stiff back.    Ortho Exam Imaging: Xr C-arm No Report  Result Date: 12/11/2017 Please see Notes or Procedures tab for imaging impression.   Past Medical/Family/Surgical/Social History: Medications & Allergies reviewed per EMR, new medications updated. Patient Active Problem List   Diagnosis Date Noted  . Muscle spasm of back 04/24/2017  . Osteoarthritis of both hips 01/15/2017  . Recurrent headache 01/06/2017  . Irregular menstrual cycle 05/30/2016  . Chronic pain syndrome 09/21/2015  . HYPERTRIGLYCERIDEMIA 05/02/2009  . 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 Insomnia 05/01/2009  . SCOLIOSIS 05/18/2008  . Diabetes type 2, uncontrolled ([facility]) 04/18/2008  . DEPRESSION 04/18/2008  . Essential hypertension, benign 04/18/2008   Past Medical History:  Diagnosis Date  . Arthritis   . GERD (gastroesophageal reflux disease)   . Headache    migraine 2 weeks ago  . Hyperlipidemia   . Hypertension associated with diabetes ([facility] [facility])   . Renal disorder    has both kidneys, compacted and 'squashed together"  . Scoliosis   . Sleep apnea    in the midst of being tested for it.   waiting on scheduling   . Type 2 diabetes mellitus treated with insulin ([facility])    dx 2000   Family History  Problem Relation Age of Onset  . Cancer Maternal Grandmother        mandibular cancer   . Diabetes Mother   . Heart failure Mother   . CAD Mother        triple CABG  . Diabetes Maternal Aunt   . Stroke Father    Past Surgical History:  Procedure Laterality Date  . BACK SURGERY    . CERVICAL FUSION    . frontal closed sutures to forehead    . Harrington Rod placement    . HIP SURGERY     staph infection  . LUMBAR FUSION    . RADIOLOGY WITH ANESTHESIA N/A 02/25/2017   Procedure: RADIOLOGY WITH ANESTHESIA/MRI THORACIC SPINE AND LEFT SHOULDER WITHOUT CONTRAST.;  Surgeon: Radiologist, Medication, MD;  Location: [facility];  Service: Radiology;  Laterality: N/A;   Social History   Occupational History     Employer: UNEMPLOYED  Tobacco Use  . Smoking status: Never Smoker  . Smokeless tobacco: Never Used  Substance and Sexual Activity  . Alcohol use: No  . Drug use: No  . Sexual activity: Yes    Birth control/protection: Other-see comments    Comment: unable to have children d/t scoliosis

## 2017-12-12 MED ORDER — TRIAMCINOLONE ACETONIDE 40 MG/ML IJ SUSP
80.0000 mg | INTRAMUSCULAR | Status: AC | PRN
  Administered 2017-12-11: 80 mg via INTRA_ARTICULAR

## 2017-12-12 MED ORDER — BUPIVACAINE HCL 0.5 % IJ SOLN
3.0000 mL | INTRAMUSCULAR | Status: AC | PRN
  Administered 2017-12-11: 3 mL via INTRA_ARTICULAR

## 2017-12-18 MED FILL — LANTUS SOLOSTAR 100 UNITS/M: 100 | 30 days supply | Qty: 12 | Fill #1

## 2017-12-18 MED FILL — NOVOLOG FLEXPEN SYRINGE: 100 | 30 days supply | Qty: 9 | Fill #1

## 2018-01-18 ENCOUNTER — Other Ambulatory Visit (INDEPENDENT_AMBULATORY_CARE_PROVIDER_SITE_OTHER): Payer: Self-pay | Admitting: Orthopaedic Surgery

## 2018-01-19 ENCOUNTER — Other Ambulatory Visit (INDEPENDENT_AMBULATORY_CARE_PROVIDER_SITE_OTHER): Payer: Self-pay | Admitting: Orthopaedic Surgery

## 2018-01-19 ENCOUNTER — Other Ambulatory Visit: Payer: Self-pay | Admitting: Internal Medicine

## 2018-01-19 DIAGNOSIS — G894 Chronic pain syndrome: Secondary | ICD-10-CM

## 2018-01-19 MED ORDER — DICLOFENAC SODIUM 75 MG PO TBEC: 75.0000 mg | DELAYED_RELEASE_TABLET | Freq: Two times a day (BID) | ORAL | 0 refills | Status: DC

## 2018-01-19 NOTE — Telephone Encounter (Signed)
Xu patient

## 2018-01-30 DIAGNOSIS — G894 Chronic pain syndrome: Secondary | ICD-10-CM | POA: Diagnosis not present

## 2018-01-30 DIAGNOSIS — R51 Headache: Secondary | ICD-10-CM | POA: Diagnosis not present

## 2018-01-30 DIAGNOSIS — M25511 Pain in right shoulder: Secondary | ICD-10-CM | POA: Diagnosis not present

## 2018-01-30 DIAGNOSIS — M79621 Pain in right upper arm: Secondary | ICD-10-CM | POA: Diagnosis not present

## 2018-01-30 DIAGNOSIS — M79641 Pain in right hand: Secondary | ICD-10-CM | POA: Diagnosis not present

## 2018-02-05 ENCOUNTER — Ambulatory Visit: Payer: Medicaid Other | Admitting: Internal Medicine

## 2018-02-06 ENCOUNTER — Encounter: Payer: Self-pay | Admitting: Internal Medicine

## 2018-02-06 ENCOUNTER — Ambulatory Visit: Payer: Medicaid Other | Attending: Internal Medicine | Admitting: Internal Medicine

## 2018-02-06 VITALS — BP 152/93 | HR 88 | Temp 98.2°F | Resp 16 | Wt 248.4 lb

## 2018-02-06 DIAGNOSIS — Z79899 Other long term (current) drug therapy: Secondary | ICD-10-CM | POA: Insufficient documentation

## 2018-02-06 DIAGNOSIS — IMO0001 Reserved for inherently not codable concepts without codable children: Secondary | ICD-10-CM

## 2018-02-06 DIAGNOSIS — F329 Major depressive disorder, single episode, unspecified: Secondary | ICD-10-CM | POA: Insufficient documentation

## 2018-02-06 DIAGNOSIS — E1165 Type 2 diabetes mellitus with hyperglycemia: Secondary | ICD-10-CM | POA: Diagnosis not present

## 2018-02-06 DIAGNOSIS — G43909 Migraine, unspecified, not intractable, without status migrainosus: Secondary | ICD-10-CM | POA: Diagnosis not present

## 2018-02-06 DIAGNOSIS — M419 Scoliosis, unspecified: Secondary | ICD-10-CM | POA: Diagnosis not present

## 2018-02-06 DIAGNOSIS — Z888 Allergy status to other drugs, medicaments and biological substances status: Secondary | ICD-10-CM | POA: Insufficient documentation

## 2018-02-06 DIAGNOSIS — M62838 Other muscle spasm: Secondary | ICD-10-CM

## 2018-02-06 DIAGNOSIS — Z823 Family history of stroke: Secondary | ICD-10-CM | POA: Diagnosis not present

## 2018-02-06 DIAGNOSIS — Z981 Arthrodesis status: Secondary | ICD-10-CM | POA: Insufficient documentation

## 2018-02-06 DIAGNOSIS — M160 Bilateral primary osteoarthritis of hip: Secondary | ICD-10-CM | POA: Insufficient documentation

## 2018-02-06 DIAGNOSIS — Z833 Family history of diabetes mellitus: Secondary | ICD-10-CM | POA: Insufficient documentation

## 2018-02-06 DIAGNOSIS — Z794 Long term (current) use of insulin: Secondary | ICD-10-CM | POA: Diagnosis not present

## 2018-02-06 DIAGNOSIS — I10 Essential (primary) hypertension: Secondary | ICD-10-CM | POA: Insufficient documentation

## 2018-02-06 DIAGNOSIS — G4700 Insomnia, unspecified: Secondary | ICD-10-CM | POA: Diagnosis not present

## 2018-02-06 DIAGNOSIS — Z8249 Family history of ischemic heart disease and other diseases of the circulatory system: Secondary | ICD-10-CM | POA: Insufficient documentation

## 2018-02-06 DIAGNOSIS — G894 Chronic pain syndrome: Secondary | ICD-10-CM | POA: Insufficient documentation

## 2018-02-06 DIAGNOSIS — Z7951 Long term (current) use of inhaled steroids: Secondary | ICD-10-CM | POA: Insufficient documentation

## 2018-02-06 DIAGNOSIS — E119 Type 2 diabetes mellitus without complications: Secondary | ICD-10-CM | POA: Diagnosis not present

## 2018-02-06 MED ORDER — TIZANIDINE HCL 4 MG PO TABS: 4.0000 mg | ORAL_TABLET | Freq: Three times a day (TID) | ORAL | 2 refills | Status: DC | PRN

## 2018-02-06 MED ORDER — CARVEDILOL 3.125 MG PO TABS: 3.1250 mg | ORAL_TABLET | Freq: Two times a day (BID) | ORAL | 3 refills | Status: DC

## 2018-02-06 NOTE — Progress Notes (Signed)
Patient ID: Briana Williams, female    DOB: [birth year]  MRN: [id number]  CC: Diabetes and Hypertension   Subjective: Briana Williams is a 37 y.o. female who presents for chronic disease management Her concerns today include:  Hx of scoliosis(multiple back surgeries), HTN, insomnia, DM2, depression, migraines. OA hips seen on MRI,   Had Pelvic MRI.  No suspicious ovarian or adnexal masses were seen.  In particular the previously described left adnexal mass was absent on this scan.  She did have a new 2.5 cm follicular cyst in the right ovary.  Menses heavy and painful.  Would like hysterectomy.  Reports that she had discussed this with gynecology but they told her that this is not an option for her.  DM:   Checks 1-2 x a wk.  Range 180. Eating habits:  Not eating as much.  She cut back on pasta and bread.  Still likes rice.   Compliant with medications.  HTN:  Checking BP about 2 x a wk.  Does not recall her range. Compliant with medications and salt restriction.  No chest pains or shortness of breath.  Chronic pain: She still sees Dr. Wilburt Finlay.  On last visit she had told me that there was plans to have an infusion of 3 medications however he apparently could not get it approved from her insurance.  She states that they have tried several other medications in the past without much success for her.  She is thinking about possibly seeing another pain specialist.  She has a f/u with him next mth  Patient Active Problem List   Diagnosis Date Noted  . Muscle spasm of back 04/24/2017  . Osteoarthritis of both hips 01/15/2017  . Recurrent headache 01/06/2017  . Irregular menstrual cycle 05/30/2016  . Chronic pain syndrome 09/21/2015  . HYPERTRIGLYCERIDEMIA 05/02/2009  . 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 Insomnia 05/01/2009  . SCOLIOSIS 05/18/2008  . Diabetes type 2, uncontrolled ([facility]) 04/18/2008  . DEPRESSION 04/18/2008  . Essential hypertension, benign 04/18/2008     Current  Outpatient Medications on File Prior to Visit  Medication Sig Dispense Refill  . ACCU-CHEK SOFTCLIX LANCETS lancets 1 each by Other route 3 (three) times daily. ICD 10 E11.9 100 each 12  . albuterol (PROVENTIL HFA;VENTOLIN HFA) 108 (90 Base) MCG/ACT inhaler Inhale 1 puff into the lungs every 6 (six) hours as needed for wheezing or shortness of breath. 8 g 3  . amitriptyline (ELAVIL) 25 MG tablet Take 1 tablet (25 mg total) by mouth at bedtime. 30 tablet 6  . Blood Glucose Monitoring Suppl (ACCU-CHEK AVIVA PLUS) w/Device KIT 1 Device by Does not apply route 3 (three) times daily after meals. ICD 10 E11.9 1 kit 0  . BYETTA 5 MCG PEN 5 MCG/0.02ML SOPN injection ADMINISTER 5 MCG UNDER THE SKIN TWICE DAILY WITH A MEAL 1.2 mL 0  . diclofenac (VOLTAREN) 75 MG EC tablet TAKE 1 TABLET(75 MG) BY MOUTH TWICE DAILY 60 tablet 0  . diclofenac (VOLTAREN) 75 MG EC tablet TAKE 1 TABLET(75 MG) BY MOUTH TWICE DAILY 180 tablet 0  . diclofenac (VOLTAREN) 75 MG EC tablet TAKE 1 TABLET(75 MG) BY MOUTH TWICE DAILY 60 tablet 0  . escitalopram (LEXAPRO) 20 MG tablet Take 1 tablet (20 mg total) by mouth daily. 90 tablet 2  . fluticasone (FLONASE) 50 MCG/ACT nasal spray Place 2 sprays into both nostrils daily. 16 g 6  . gabapentin (NEURONTIN) 100 MG capsule TAKE 3 CAPSULES BY MOUTH THREE TIMES DAILY 270  capsule 5  . glucose blood (ACCU-CHEK AVIVA PLUS) test strip 1 each by Other route 3 (three) times daily. ICD 10 E11.9 100 each 12  . hydrochlorothiazide (HYDRODIURIL) 12.5 MG tablet Take 1 tablet (12.5 mg total) by mouth daily. 30 tablet 6  . ibuprofen (ADVIL,MOTRIN) 200 MG tablet Take 600 mg by mouth every 8 (eight) hours as needed for mild pain.    Marland Kitchen insulin aspart (NOVOLOG FLEXPEN) 100 UNIT/ML FlexPen 15 units subcut with the two largest meals of the day 15 mL 11  . Insulin Glargine (LANTUS SOLOSTAR) 100 UNIT/ML Solostar Pen Inject 38 Units into the skin daily at 10 pm. 15 mL 11  . Insulin Pen Needle (B-D ULTRAFINE III  SHORT PEN) 31G X 8 MM MISC 1 application by Does not apply route daily. 60 each 5  . Lancet Devices (ACCU-CHEK SOFTCLIX) lancets 1 each by Other route 3 (three) times daily. ICD 10 E11.9 1 each 0  . lisinopril (PRINIVIL,ZESTRIL) 40 MG tablet Take 1 tablet (40 mg total) by mouth daily. 90 tablet 1  . LORazepam (ATIVAN) 0.5 MG tablet 1 tablet p.o. once few minutes prior to MRI 1 tablet 0  . pantoprazole (PROTONIX) 40 MG tablet Take 1 tablet (40 mg total) by mouth daily. 30 tablet 0  . sitaGLIPtin (JANUVIA) 50 MG tablet Take 1 tablet (50 mg total) by mouth daily. 30 tablet 3  . SUMAtriptan (IMITREX) 25 MG tablet Take 1-2 tabs at start of headache. May repeat in 2 hrs if no relief. Max 4 tabs/24 hr. 10 tablet 1   No current facility-administered medications on file prior to visit.     Allergies  Allergen Reactions  . Metformin And Related Diarrhea  . Norvasc [Amlodipine Besylate]     Ankle swelling  . Ultrasound Cream Rash    unknown  . Ultrasound Gel Rash    Patient gets a red rash with use of ultrasound gel    Social History   Socioeconomic History  . Marital status: Married    Spouse name: Not on file  . Number of children: 3  . Years of education: Not on file  . Highest education level: Not on file  Occupational History    Employer: UNEMPLOYED  Social Needs  . Financial resource strain: Not on file  . Food insecurity:    Worry: Not on file    Inability: Not on file  . Transportation needs:    Medical: Not on file    Non-medical: Not on file  Tobacco Use  . Smoking status: Never Smoker  . Smokeless tobacco: Never Used  Substance and Sexual Activity  . Alcohol use: No  . Drug use: No  . Sexual activity: Yes    Birth control/protection: Other-see comments    Comment: unable to have children d/t scoliosis  Lifestyle  . Physical activity:    Days per week: Not on file    Minutes per session: Not on file  . Stress: Not on file  Relationships  . Social connections:     Talks on phone: Not on file    Gets together: Not on file    Attends religious service: Not on file    Active member of club or organization: Not on file    Attends meetings of clubs or organizations: Not on file    Relationship status: Not on file  . Intimate partner violence:    Fear of current or ex partner: Not on file    Emotionally abused: Not  on file    Physically abused: Not on file    Forced sexual activity: Not on file  Other Topics Concern  . Not on file  Social History Narrative  . Not on file    Family History  Problem Relation Age of Onset  . Cancer Maternal Grandmother        mandibular cancer   . Diabetes Mother   . Heart failure Mother   . CAD Mother        triple CABG  . Diabetes Maternal Aunt   . Stroke Father     Past Surgical History:  Procedure Laterality Date  . BACK SURGERY    . CERVICAL FUSION    . frontal closed sutures to forehead    . Harrington Rod placement    . HIP SURGERY     staph infection  . LUMBAR FUSION    . RADIOLOGY WITH ANESTHESIA N/A 02/25/2017   Procedure: RADIOLOGY WITH ANESTHESIA/MRI THORACIC SPINE AND LEFT SHOULDER WITHOUT CONTRAST.;  Surgeon: Radiologist, Medication, MD;  Location: [facility];  Service: Radiology;  Laterality: N/A;    ROS: Review of Systems Negative except as stated above PHYSICAL EXAM: BP (!) 152/93   Pulse 88   Temp 98.2 F (36.8 C) (Oral)   Resp 16   Wt 248 lb 6.4 oz (112.7 kg)   SpO2 97%   BMI 54.71 kg/m   Wt Readings from Last 3 Encounters:  02/06/18 248 lb 6.4 oz (112.7 kg)  10/30/17 235 lb 12.8 oz (107 kg)  08/19/17 243 lb 3.2 oz (110.3 kg)   Physical Exam  General appearance - alert, well appearing, and in no distress Mental status - normal mood, behavior, speech, dress, motor activity, and thought processes Neck - supple, no significant adenopathy Chest - clear to auscultation, no wheezes, rales or rhonchi, symmetric air entry Heart - normal rate, regular rhythm, normal S1, S2, no  murmurs, rubs, clicks or gallops Extremities - peripheral pulses normal, no pedal edema, no clubbing or cyanosis Leap exam: Normal.  Results for orders placed or performed in visit on 81/85/63  Basic Metabolic Panel  Result Value Ref Range   Glucose 169 (H) 65 - 99 mg/dL   BUN 17 6 - 20 mg/dL   Creatinine, Ser 0.74 0.57 - 1.00 mg/dL   GFR calc non Af Amer 104 >59 mL/min/1.73   GFR calc Af Amer 120 >59 mL/min/1.73   BUN/Creatinine Ratio 23 9 - 23   Sodium 137 134 - 144 mmol/L   Potassium 4.1 3.5 - 5.2 mmol/L   Chloride 100 96 - 106 mmol/L   CO2 21 20 - 29 mmol/L   Calcium 9.6 8.7 - 10.2 mg/dL  POCT glucose (manual entry)  Result Value Ref Range   POC Glucose 197 (A) 70 - 99 mg/dl  POCT glycosylated hemoglobin (Hb A1C)  Result Value Ref Range   Hemoglobin A1C 10.3    BS 222 ASSESSMENT AND PLAN: 1. Uncontrolled type 2 diabetes mellitus without complication, with long-term current use of insulin ([facility]) Continue to encourage her towards healthy eating habits.  Continue current medications for now.  Further adjustments will be made based on A1c when results become available - Hemoglobin A1c - POCT glucose (manual entry) - CBC - Lipid panel - Hepatic Function Panel  2. Essential hypertension Not at goal.  Add low-dose carvedilol. - carvedilol (COREG) 3.125 MG tablet; Take 1 tablet (3.125 mg total) by mouth 2 (two) times daily with a meal.  Dispense: 60 tablet;  Refill: 3  3. Muscle spasms of lower extremity, unspecified laterality - tiZANidine (ZANAFLEX) 4 MG tablet; Take 1 tablet (4 mg total) by mouth every 8 (eight) hours as needed for muscle spasms.  Dispense: 90 tablet; Refill: 2  4. Chronic pain syndrome Patient plans to keep her appointment with Dr. Wilburt Finlay next month.  She will let me know if and when she decides to see another pain specialist.   Patient was given the opportunity to ask questions.  Patient verbalized understanding of the plan and was able to repeat key  elements of the plan.   Orders Placed This Encounter  Procedures  . Hemoglobin A1c  . CBC  . Lipid panel  . Hepatic Function Panel  . POCT glucose (manual entry)     Requested Prescriptions   Signed Prescriptions Disp Refills  . tiZANidine (ZANAFLEX) 4 MG tablet 90 tablet 2    Sig: Take 1 tablet (4 mg total) by mouth every 8 (eight) hours as needed for muscle spasms.  . carvedilol (COREG) 3.125 MG tablet 60 tablet 3    Sig: Take 1 tablet (3.125 mg total) by mouth 2 (two) times daily with a meal.    Return in about 3 months (around 05/09/2018).  Karle Plumber, MD, FACP

## 2018-02-07 LAB — CBC
Hematocrit: 39.1 % (ref 34.0–46.6)
Hemoglobin: 12.5 g/dL (ref 11.1–15.9)
MCH: 28.8 pg (ref 26.6–33.0)
MCHC: 32 g/dL (ref 31.5–35.7)
MCV: 90 fL (ref 79–97)
Platelets: 222 10*3/uL (ref 150–450)
RBC: 4.34 x10E6/uL (ref 3.77–5.28)
RDW: 15.2 % (ref 12.3–15.4)
WBC: 9.1 10*3/uL (ref 3.4–10.8)

## 2018-02-07 LAB — HEPATIC FUNCTION PANEL
ALT: 27 IU/L (ref 0–32)
AST: 16 IU/L (ref 0–40)
Albumin: 4.4 g/dL (ref 3.5–5.5)
Alkaline Phosphatase: 75 IU/L (ref 39–117)
Bilirubin Total: 0.3 mg/dL (ref 0.0–1.2)
Bilirubin, Direct: 0.08 mg/dL (ref 0.00–0.40)
Total Protein: 7.4 g/dL (ref 6.0–8.5)

## 2018-02-07 LAB — HEMOGLOBIN A1C
ESTIMATED AVERAGE GLUCOSE: 235 mg/dL
HEMOGLOBIN A1C: 9.8 % — AB (ref 4.8–5.6)

## 2018-02-07 LAB — LIPID PANEL
CHOLESTEROL TOTAL: 246 mg/dL — AB (ref 100–199)
Chol/HDL Ratio: 5.3 ratio — ABNORMAL HIGH (ref 0.0–4.4)
HDL: 46 mg/dL (ref >39)
LDL CALC: 148 mg/dL — AB (ref 0–99)
TRIGLYCERIDES: 260 mg/dL — AB (ref 0–149)
VLDL CHOLESTEROL CAL: 52 mg/dL — AB (ref 5–40)

## 2018-02-08 ENCOUNTER — Other Ambulatory Visit: Payer: Self-pay | Admitting: Internal Medicine

## 2018-02-08 MED ORDER — ATORVASTATIN CALCIUM 10 MG PO TABS: 10.0000 mg | ORAL_TABLET | Freq: Every day | ORAL | 3 refills | Status: DC

## 2018-02-09 LAB — GLUCOSE, POCT (MANUAL RESULT ENTRY): POC GLUCOSE: 222 mg/dL — AB (ref 70–99)

## 2018-02-09 MED FILL — ATORVASTATIN 10 MG TABLET: 10 | 30 days supply | Qty: 30 | Fill #0

## 2018-02-09 MED FILL — NOVOLOG FLEXPEN SYRINGE: 100 | 30 days supply | Qty: 15 | Fill #2

## 2018-02-09 MED FILL — LANTUS SOLOSTAR 100 UNITS/M: 100 | 30 days supply | Qty: 15 | Fill #2

## 2018-02-14 ENCOUNTER — Other Ambulatory Visit: Payer: Self-pay | Admitting: Internal Medicine

## 2018-02-14 DIAGNOSIS — I10 Essential (primary) hypertension: Secondary | ICD-10-CM

## 2018-03-13 ENCOUNTER — Encounter (INDEPENDENT_AMBULATORY_CARE_PROVIDER_SITE_OTHER): Payer: Self-pay | Admitting: Physical Medicine and Rehabilitation

## 2018-03-17 ENCOUNTER — Encounter: Payer: Self-pay | Admitting: Internal Medicine

## 2018-03-23 ENCOUNTER — Ambulatory Visit (INDEPENDENT_AMBULATORY_CARE_PROVIDER_SITE_OTHER): Payer: Medicaid Other | Admitting: Physical Medicine and Rehabilitation

## 2018-04-05 ENCOUNTER — Other Ambulatory Visit: Payer: Self-pay | Admitting: Internal Medicine

## 2018-04-05 DIAGNOSIS — M62830 Muscle spasm of back: Secondary | ICD-10-CM

## 2018-04-06 DIAGNOSIS — M542 Cervicalgia: Secondary | ICD-10-CM | POA: Diagnosis not present

## 2018-04-06 DIAGNOSIS — M79605 Pain in left leg: Secondary | ICD-10-CM | POA: Diagnosis not present

## 2018-04-06 DIAGNOSIS — M79622 Pain in left upper arm: Secondary | ICD-10-CM | POA: Diagnosis not present

## 2018-04-06 DIAGNOSIS — G8929 Other chronic pain: Secondary | ICD-10-CM | POA: Diagnosis not present

## 2018-04-06 DIAGNOSIS — M25512 Pain in left shoulder: Secondary | ICD-10-CM | POA: Diagnosis not present

## 2018-04-06 DIAGNOSIS — M79662 Pain in left lower leg: Secondary | ICD-10-CM | POA: Diagnosis not present

## 2018-04-06 DIAGNOSIS — M25552 Pain in left hip: Secondary | ICD-10-CM | POA: Diagnosis not present

## 2018-04-13 ENCOUNTER — Other Ambulatory Visit (INDEPENDENT_AMBULATORY_CARE_PROVIDER_SITE_OTHER): Payer: Self-pay | Admitting: Orthopaedic Surgery

## 2018-04-16 ENCOUNTER — Encounter: Payer: Self-pay | Admitting: Internal Medicine

## 2018-04-17 MED ORDER — CLONAZEPAM 0.5 MG PO TABS: 0.5000 mg | ORAL_TABLET | Freq: Every day | ORAL | 0 refills | Status: DC | PRN

## 2018-04-22 ENCOUNTER — Other Ambulatory Visit: Payer: Self-pay | Admitting: Internal Medicine

## 2018-04-22 DIAGNOSIS — M62838 Other muscle spasm: Secondary | ICD-10-CM

## 2018-04-24 ENCOUNTER — Telehealth: Payer: Self-pay | Admitting: Internal Medicine

## 2018-04-24 NOTE — Telephone Encounter (Signed)
Didn't call pt. 

## 2018-04-24 NOTE — Telephone Encounter (Signed)
Patient called stating that she had received a call from the nurse, are requesting a call back. Please follow up.

## 2018-04-27 ENCOUNTER — Encounter: Payer: Self-pay | Admitting: Internal Medicine

## 2018-04-28 ENCOUNTER — Telehealth: Payer: Self-pay

## 2018-04-28 ENCOUNTER — Other Ambulatory Visit: Payer: Self-pay | Admitting: Family Medicine

## 2018-04-28 DIAGNOSIS — M62838 Other muscle spasm: Secondary | ICD-10-CM

## 2018-04-28 MED ORDER — TIZANIDINE HCL 4 MG PO TABS: 4.0000 mg | ORAL_TABLET | Freq: Three times a day (TID) | ORAL | 2 refills | Status: DC | PRN

## 2018-04-28 NOTE — Telephone Encounter (Signed)
I was notified by Elinor Parkinson that this patient may need a PA for her tizanidine. I verified that her medicaid is active and that tizanidine is on the formulary. Then I reached out to her pharmacy who confirmed that she is too early in filling this RX, she was given a 90 day supply #270 tabs on 02/14/18 and her insurance will not pay for a refill until 05/06/18. She should have enough medication to last until 05/15/18 if taken according to the directions given.

## 2018-04-28 NOTE — Telephone Encounter (Signed)
Contacted pt and informed her of Joycelyn Schmid response pt doesn't have any questions or concerns

## 2018-05-11 ENCOUNTER — Other Ambulatory Visit (INDEPENDENT_AMBULATORY_CARE_PROVIDER_SITE_OTHER): Payer: Self-pay | Admitting: Orthopaedic Surgery

## 2018-05-14 ENCOUNTER — Encounter: Payer: Self-pay | Admitting: Internal Medicine

## 2018-05-14 ENCOUNTER — Ambulatory Visit: Payer: Medicaid Other | Attending: Internal Medicine | Admitting: Internal Medicine

## 2018-05-14 VITALS — BP 156/89 | HR 88 | Temp 98.2°F | Resp 16 | Wt 251.8 lb

## 2018-05-14 DIAGNOSIS — Z981 Arthrodesis status: Secondary | ICD-10-CM | POA: Insufficient documentation

## 2018-05-14 DIAGNOSIS — Z79899 Other long term (current) drug therapy: Secondary | ICD-10-CM | POA: Diagnosis not present

## 2018-05-14 DIAGNOSIS — Z8249 Family history of ischemic heart disease and other diseases of the circulatory system: Secondary | ICD-10-CM | POA: Diagnosis not present

## 2018-05-14 DIAGNOSIS — M4120 Other idiopathic scoliosis, site unspecified: Secondary | ICD-10-CM

## 2018-05-14 DIAGNOSIS — E119 Type 2 diabetes mellitus without complications: Secondary | ICD-10-CM | POA: Insufficient documentation

## 2018-05-14 DIAGNOSIS — F329 Major depressive disorder, single episode, unspecified: Secondary | ICD-10-CM | POA: Insufficient documentation

## 2018-05-14 DIAGNOSIS — M160 Bilateral primary osteoarthritis of hip: Secondary | ICD-10-CM | POA: Diagnosis not present

## 2018-05-14 DIAGNOSIS — IMO0001 Reserved for inherently not codable concepts without codable children: Secondary | ICD-10-CM

## 2018-05-14 DIAGNOSIS — F419 Anxiety disorder, unspecified: Secondary | ICD-10-CM | POA: Diagnosis not present

## 2018-05-14 DIAGNOSIS — G4700 Insomnia, unspecified: Secondary | ICD-10-CM | POA: Insufficient documentation

## 2018-05-14 DIAGNOSIS — Z7951 Long term (current) use of inhaled steroids: Secondary | ICD-10-CM | POA: Insufficient documentation

## 2018-05-14 DIAGNOSIS — E1165 Type 2 diabetes mellitus with hyperglycemia: Secondary | ICD-10-CM | POA: Diagnosis not present

## 2018-05-14 DIAGNOSIS — E781 Pure hyperglyceridemia: Secondary | ICD-10-CM | POA: Diagnosis not present

## 2018-05-14 DIAGNOSIS — Z794 Long term (current) use of insulin: Secondary | ICD-10-CM

## 2018-05-14 DIAGNOSIS — I10 Essential (primary) hypertension: Secondary | ICD-10-CM | POA: Diagnosis not present

## 2018-05-14 DIAGNOSIS — Z91128 Patient's intentional underdosing of medication regimen for other reason: Secondary | ICD-10-CM | POA: Diagnosis not present

## 2018-05-14 DIAGNOSIS — M62838 Other muscle spasm: Secondary | ICD-10-CM | POA: Diagnosis not present

## 2018-05-14 DIAGNOSIS — F32A Depression, unspecified: Secondary | ICD-10-CM

## 2018-05-14 DIAGNOSIS — M419 Scoliosis, unspecified: Secondary | ICD-10-CM | POA: Insufficient documentation

## 2018-05-14 LAB — POCT GLYCOSYLATED HEMOGLOBIN (HGB A1C): HbA1c, POC (controlled diabetic range): 11.2 % — AB (ref 0.0–7.0)

## 2018-05-14 LAB — GLUCOSE, POCT (MANUAL RESULT ENTRY): POC Glucose: 302 mg/dl — AB (ref 70–99)

## 2018-05-14 MED ORDER — CARVEDILOL 3.125 MG PO TABS: 3.1250 mg | ORAL_TABLET | Freq: Two times a day (BID) | ORAL | 6 refills | Status: DC

## 2018-05-14 MED ORDER — HYDROCHLOROTHIAZIDE 12.5 MG PO TABS: 12.5000 mg | ORAL_TABLET | Freq: Every day | ORAL | 6 refills | Status: DC

## 2018-05-14 MED ORDER — INSULIN GLARGINE 100 UNIT/ML SOLOSTAR PEN: PEN_INJECTOR | SUBCUTANEOUS | 11 refills | Status: DC

## 2018-05-14 MED ORDER — TIZANIDINE HCL 4 MG PO TABS: 4.0000 mg | ORAL_TABLET | Freq: Three times a day (TID) | ORAL | 4 refills | Status: DC | PRN

## 2018-05-14 NOTE — Progress Notes (Signed)
Patient ID: Briana Williams, female    DOB: [birth year]  MRN: [id number]  CC: Diabetes and Hypertension   Subjective: Briana Williams is a 37 y.o. female who presents for chronic disease management.  Husband is with her. Her concerns today include:  Hx of scoliosis(multiple back surgeries), HTN, insomnia, DM2, depression, migraines. OA hips seen on MRI,  Patient complains of feeling stressed.  Mom is on Hospice at a nursing home.  She has a brother and a sister who have not seen them mom in 51 years.  She feels that everything falls to her.  She is at the nursing home daily.  She feels she spends more time caring for her mother, her husband and 2 sons (ages 68 and 49 who still lives with her) than she spends caring for herself.   She had sent me a my chart message several weeks ago requesting something to help calm her nerves.  I had given a limited prescription for clonazepam to use as needed.  She still feels very stressed.  Was seeing a psychiatrist with the pain clinic and that she went to but states she was told by him that he cannot prescribe anything.  She is on Lexapro and amitriptyline.  DM: Not checking blood sugars regularly.  Supposed to be on Lantus 38 units but patient states she has been taking Lantus 27 units daily and Novolog 15-17 units with the 2 largest meals of the day Eating habits: appetite down   HTN:  Not checking BP Out of Coreg x 1 wk. states she was told by the pharmacy that the prescription was canceled.  Chronic pain syndrome: She has scoliosis with history of multiple back surgeries and osteoarthritis of the hips.  She was being followed by [facility].  On last visit about 2 months ago, she reports that Dr. Wilburt Finlay was to refer her to [facility] to get some type of infusion of pain medication but she has not heard from him.  She feels that things are not working out with his pain clinic and prefers referral to a different one. -Requests refill on  Zanaflex.  Refill was sent recently by 1 of our providers that she states that the pharmacy said that the prescription was canceled. Patient Active Problem List   Diagnosis Date Noted  . Muscle spasm of back 04/24/2017  . Osteoarthritis of both hips 01/15/2017  . Recurrent headache 01/06/2017  . Irregular menstrual cycle 05/30/2016  . Chronic pain syndrome 09/21/2015  . HYPERTRIGLYCERIDEMIA 05/02/2009  . 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 Insomnia 05/01/2009  . SCOLIOSIS 05/18/2008  . Diabetes type 2, uncontrolled ([place]) 04/18/2008  . DEPRESSION 04/18/2008  . Essential hypertension, benign 04/18/2008     Current Outpatient Medications on File Prior to Visit  Medication Sig Dispense Refill  . ACCU-CHEK SOFTCLIX LANCETS lancets 1 each by Other route 3 (three) times daily. ICD 10 E11.9 100 each 12  . albuterol (PROVENTIL HFA;VENTOLIN HFA) 108 (90 Base) MCG/ACT inhaler Inhale 1 puff into the lungs every 6 (six) hours as needed for wheezing or shortness of breath. 8 g 3  . amitriptyline (ELAVIL) 25 MG tablet Take 1 tablet (25 mg total) by mouth at bedtime. 30 tablet 6  . atorvastatin (LIPITOR) 10 MG tablet Take 1 tablet (10 mg total) by mouth daily. 90 tablet 3  . Blood Glucose Monitoring Suppl (ACCU-CHEK AVIVA PLUS) w/Device KIT 1 Device by Does not apply route 3 (three) times daily after meals. ICD 10 E11.9  1 kit 0  . BYETTA 5 MCG PEN 5 MCG/0.02ML SOPN injection ADMINISTER 5 MCG UNDER THE SKIN TWICE DAILY WITH A MEAL 1.2 mL 0  . diclofenac (VOLTAREN) 75 MG EC tablet TAKE 1 TABLET(75 MG) BY MOUTH TWICE DAILY 60 tablet 0  . diclofenac (VOLTAREN) 75 MG EC tablet TAKE 1 TABLET(75 MG) BY MOUTH TWICE DAILY 60 tablet 0  . diclofenac (VOLTAREN) 75 MG EC tablet TAKE 1 TABLET(75 MG) BY MOUTH TWICE DAILY 180 tablet 0  . escitalopram (LEXAPRO) 20 MG tablet Take 1 tablet (20 mg total) by mouth daily. 90 tablet 2  . fluticasone (FLONASE) 50 MCG/ACT nasal spray Place 2 sprays into both nostrils daily. 16 g 6    . gabapentin (NEURONTIN) 100 MG capsule TAKE 3 CAPSULES BY MOUTH THREE TIMES DAILY 270 capsule 5  . glucose blood (ACCU-CHEK AVIVA PLUS) test strip 1 each by Other route 3 (three) times daily. ICD 10 E11.9 100 each 12  . ibuprofen (ADVIL,MOTRIN) 200 MG tablet Take 600 mg by mouth every 8 (eight) hours as needed for mild pain.    Marland Kitchen insulin aspart (NOVOLOG FLEXPEN) 100 UNIT/ML FlexPen 15 units subcut with the two largest meals of the day 15 mL 11  . Insulin Pen Needle (B-D ULTRAFINE III SHORT PEN) 31G X 8 MM MISC 1 application by Does not apply route daily. 60 each 5  . Lancet Devices (ACCU-CHEK SOFTCLIX) lancets 1 each by Other route 3 (three) times daily. ICD 10 E11.9 1 each 0  . lisinopril (PRINIVIL,ZESTRIL) 40 MG tablet TAKE 1 TABLET BY MOUTH DAILY 90 tablet 0  . pantoprazole (PROTONIX) 40 MG tablet Take 1 tablet (40 mg total) by mouth daily. 30 tablet 0  . sitaGLIPtin (JANUVIA) 50 MG tablet Take 1 tablet (50 mg total) by mouth daily. 30 tablet 3  . SUMAtriptan (IMITREX) 25 MG tablet Take 1-2 tabs at start of headache. May repeat in 2 hrs if no relief. Max 4 tabs/24 hr. 10 tablet 1   No current facility-administered medications on file prior to visit.     Allergies  Allergen Reactions  . Metformin And Related Diarrhea  . Norvasc [Amlodipine Besylate]     Ankle swelling  . Ultrasound Cream Rash    unknown  . Ultrasound Gel Rash    Patient gets a red rash with use of ultrasound gel    Social History   Socioeconomic History  . Marital status: Married    Spouse name: Not on file  . Number of children: 3  . Years of education: Not on file  . Highest education level: Not on file  Occupational History    Employer: UNEMPLOYED  Social Needs  . Financial resource strain: Not on file  . Food insecurity:    Worry: Not on file    Inability: Not on file  . Transportation needs:    Medical: Not on file    Non-medical: Not on file  Tobacco Use  . Smoking status: Never Smoker  .  Smokeless tobacco: Never Used  Substance and Sexual Activity  . Alcohol use: No  . Drug use: No  . Sexual activity: Yes    Birth control/protection: Other-see comments    Comment: unable to have children d/t scoliosis  Lifestyle  . Physical activity:    Days per week: Not on file    Minutes per session: Not on file  . Stress: Not on file  Relationships  . Social connections:    Talks on phone: Not  on file    Gets together: Not on file    Attends religious service: Not on file    Active member of club or organization: Not on file    Attends meetings of clubs or organizations: Not on file    Relationship status: Not on file  . Intimate partner violence:    Fear of current or ex partner: Not on file    Emotionally abused: Not on file    Physically abused: Not on file    Forced sexual activity: Not on file  Other Topics Concern  . Not on file  Social History Narrative  . Not on file    Family History  Problem Relation Age of Onset  . Cancer Maternal Grandmother        mandibular cancer   . Diabetes Mother   . Heart failure Mother   . CAD Mother        triple CABG  . Diabetes Maternal Aunt   . Stroke Father     Past Surgical History:  Procedure Laterality Date  . BACK SURGERY    . CERVICAL FUSION    . frontal closed sutures to forehead    . Harrington Rod placement    . HIP SURGERY     staph infection  . LUMBAR FUSION    . RADIOLOGY WITH ANESTHESIA N/A 02/25/2017   Procedure: RADIOLOGY WITH ANESTHESIA/MRI THORACIC SPINE AND LEFT SHOULDER WITHOUT CONTRAST.;  Surgeon: Radiologist, Medication, MD;  Location: [place];  Service: Radiology;  Laterality: N/A;    ROS: Review of Systems Negative except as above. PHYSICAL EXAM: BP (!) 156/89   Pulse 88   Temp 98.2 F (36.8 C) (Oral)   Resp 16   Wt 251 lb 12.8 oz (114.2 kg)   SpO2 97%   BMI 55.46 kg/m   Physical Exam  General appearance - alert, well appearing, and in no distress Mental status -patient tearful  when talking about her mother and the increased stress that she has been feeling. Neck - supple, no significant adenopathy Chest - clear to auscultation, no wheezes, rales or rhonchi, symmetric air entry Heart - normal rate, regular rhythm, normal S1, S2, no murmurs, rubs, clicks or gallops Extremities - peripheral pulses normal, no pedal edema, no clubbing or cyanosis  Results for orders placed or performed in visit on 05/14/18  POCT glucose (manual entry)  Result Value Ref Range   POC Glucose 302 (A) 70 - 99 mg/dl  POCT glycosylated hemoglobin (Hb A1C)  Result Value Ref Range   Hemoglobin A1C     HbA1c POC (<> result, manual entry)     HbA1c, POC (prediabetic range)     HbA1c, POC (controlled diabetic range) 11.2 (A) 0.0 - 7.0 %   Depression screen [facility] 2/9 02/06/2018 10/30/2017 08/01/2017  Decreased Interest 0 1 0  Down, Depressed, Hopeless _0 PHQ - 2 Score _1 Altered sleeping - 2 -  Tired, decreased energy - 1 -  Change in appetite - 0 -  Feeling bad or failure about yourself  - 1 -  Trouble concentrating - 0 -  Moving slowly or fidgety/restless - 0 -  Suicidal thoughts - 0 -  PHQ-9 Score - 6 -   GAD 7 : Generalized Anxiety Score 02/06/2018 10/30/2017 08/01/2017 06/16/2017  Nervous, Anxious, on Edge _2 0  Control/stop worrying 0 _3 Worry too much - different things _4 Trouble relaxing 1  _0 Restless 0 0 1 1  Easily annoyed or irritable 0 0 0 0  Afraid - awful might happen 0 1 0 0  Total GAD 7 Score _1 ASSESSMENT AND PLAN: 1. Uncontrolled type 2 diabetes mellitus without complication, with long-term current use of insulin [facility]) Encourage patient to take care of herself.  Dietary counseling given.  Recommend increase Lantus to 32 units and titrate by 2 units every 2 to 3 days to a maximum of 40 units.  Encouraged to check blood sugars at least once a day. - POCT glucose (manual entry) - POCT glycosylated hemoglobin (Hb A1C) - Insulin Glargine  (LANTUS SOLOSTAR) 100 UNIT/ML Solostar Pen; 32 units at bedtime.  Increase by 2 units every 2-3 days until morning BS consistently 90-130.  Max of 40 units daily  Dispense: 15 mL; Refill: 11  2. Essential hypertension Not at goal.  She has been out of carvedilol.  Refill given on this and hydrochlorothiazide. - carvedilol (COREG) 3.125 MG tablet; Take 1 tablet (3.125 mg total) by mouth 2 (two) times daily with a meal.  Dispense: 60 tablet; Refill: 6 - hydrochlorothiazide (HYDRODIURIL) 12.5 MG tablet; Take 1 tablet (12.5 mg total) by mouth daily.  Dispense: 30 tablet; Refill: 6  3. Muscle spasms of lower extremity, unspecified laterality - tiZANidine (ZANAFLEX) 4 MG tablet; Take 1 tablet (4 mg total) by mouth every 8 (eight) hours as needed for muscle spasms.  Dispense: 90 tablet; Refill: 4  4. SCOLIOSIS 5. Osteoarthritis of both hips, unspecified osteoarthritis type - Ambulatory referral to Pain Clinic  6. Anxiety and depression - Ambulatory referral to Psychiatry  Patient was given the opportunity to ask questions.  Patient verbalized understanding of the plan and was able to repeat key elements of the plan.   Orders Placed This Encounter  Procedures  . Ambulatory referral to Psychiatry  . Ambulatory referral to Pain Clinic  . POCT glucose (manual entry)  . POCT glycosylated hemoglobin (Hb A1C)     Requested Prescriptions   Signed Prescriptions Disp Refills  . Insulin Glargine (LANTUS SOLOSTAR) 100 UNIT/ML Solostar Pen 15 mL 11    Sig: 32 units at bedtime.  Increase by 2 units every 2-3 days until morning BS consistently 90-130.  Max of 40 units daily  . carvedilol (COREG) 3.125 MG tablet 60 tablet 6    Sig: Take 1 tablet (3.125 mg total) by mouth 2 (two) times daily with a meal.  . hydrochlorothiazide (HYDRODIURIL) 12.5 MG tablet 30 tablet 6    Sig: Take 1 tablet (12.5 mg total) by mouth daily.  Marland Kitchen tiZANidine (ZANAFLEX) 4 MG tablet 90 tablet 4    Sig: Take 1 tablet (4 mg total)  by mouth every 8 (eight) hours as needed for muscle spasms.    Return in about 4 months (around 09/14/2018).  Karle Plumber, MD, FACP

## 2018-05-14 NOTE — Progress Notes (Signed)
cbg- 302 a1c-11.2   Pt states she is having pain in her neck

## 2018-05-14 NOTE — Patient Instructions (Signed)
Please give a follow-up appointment with the clinical pharmacist in 1 month for titration of insulin.  Increase Lantus to 32 units at bedtime.  Increase by 2 units every 2 to 3 days until your morning blood sugars are consistently between 90-130.    I have sent refills on carvedilol and hydrochlorothiazide to your pharmacy.  I have sent refill on tizanidine to your pharmacy and has submitted a referral for you to see a pain specialist.

## 2018-05-19 ENCOUNTER — Other Ambulatory Visit: Payer: Self-pay | Admitting: Internal Medicine

## 2018-05-19 DIAGNOSIS — I10 Essential (primary) hypertension: Secondary | ICD-10-CM

## 2018-05-21 ENCOUNTER — Other Ambulatory Visit: Payer: Self-pay | Admitting: Internal Medicine

## 2018-05-21 NOTE — Telephone Encounter (Signed)
Pt last seen: 05/14/18 Next appt: 09/15/17 Last RX written on: 04/20/18 Date of original fill: 04/20/18 Date of refill(s): n/a  No other controlled substances were filled during this time, please refill if appropriate.

## 2018-05-22 NOTE — Telephone Encounter (Signed)
Contacted pt and made aware that rx is ready for pickup  

## 2018-05-24 ENCOUNTER — Telehealth: Payer: Self-pay | Admitting: Internal Medicine

## 2018-05-24 NOTE — Telephone Encounter (Signed)
-----   Message from Ena Dawley sent at 05/22/2018  1:45 PM EDT ----- Noted  Sent referral to Dr Corena [facility]  Ph. [place] [address]  ----- Message ----- From: Ladell Pier, MD Sent: 05/22/2018   8:02 AM EDT To: Ena Dawley  Can we get her into psychiatry as soon as possible?  Thanks.

## 2018-05-28 ENCOUNTER — Telehealth: Payer: Self-pay | Admitting: Internal Medicine

## 2018-05-28 DIAGNOSIS — Z79899 Other long term (current) drug therapy: Secondary | ICD-10-CM | POA: Diagnosis not present

## 2018-05-28 DIAGNOSIS — G894 Chronic pain syndrome: Secondary | ICD-10-CM | POA: Diagnosis not present

## 2018-05-28 DIAGNOSIS — M129 Arthropathy, unspecified: Secondary | ICD-10-CM | POA: Diagnosis not present

## 2018-05-28 DIAGNOSIS — M1990 Unspecified osteoarthritis, unspecified site: Secondary | ICD-10-CM | POA: Diagnosis not present

## 2018-05-28 DIAGNOSIS — G8929 Other chronic pain: Secondary | ICD-10-CM | POA: Diagnosis not present

## 2018-05-28 DIAGNOSIS — M549 Dorsalgia, unspecified: Secondary | ICD-10-CM | POA: Diagnosis not present

## 2018-05-28 NOTE — Telephone Encounter (Signed)
[facility] sent me a fax  To let us know that Briana Williams has declined to scheduled for an appointment at this time . Patient says she will call back when ready to schedule  .

## 2018-05-28 NOTE — Telephone Encounter (Signed)
Info from referral coordinator noted.

## 2018-06-08 ENCOUNTER — Other Ambulatory Visit (INDEPENDENT_AMBULATORY_CARE_PROVIDER_SITE_OTHER): Payer: Self-pay | Admitting: Orthopaedic Surgery

## 2018-06-08 NOTE — Telephone Encounter (Signed)
yes

## 2018-06-10 DIAGNOSIS — M79662 Pain in left lower leg: Secondary | ICD-10-CM | POA: Diagnosis not present

## 2018-06-10 DIAGNOSIS — G894 Chronic pain syndrome: Secondary | ICD-10-CM | POA: Diagnosis not present

## 2018-06-10 DIAGNOSIS — Z79899 Other long term (current) drug therapy: Secondary | ICD-10-CM | POA: Diagnosis not present

## 2018-06-10 DIAGNOSIS — M1990 Unspecified osteoarthritis, unspecified site: Secondary | ICD-10-CM | POA: Diagnosis not present

## 2018-06-11 MED FILL — LANTUS SOLOSTAR 100 UNITS/M: 100 | 30 days supply | Qty: 15 | Fill #3

## 2018-06-11 MED FILL — NOVOLOG FLEXPEN SYRINGE: 100 | 30 days supply | Qty: 15 | Fill #3

## 2018-06-18 DIAGNOSIS — M79622 Pain in left upper arm: Secondary | ICD-10-CM | POA: Diagnosis not present

## 2018-06-23 ENCOUNTER — Encounter: Payer: Self-pay | Admitting: Internal Medicine

## 2018-06-23 DIAGNOSIS — G4700 Insomnia, unspecified: Secondary | ICD-10-CM

## 2018-06-23 MED ORDER — AMITRIPTYLINE HCL 25 MG PO TABS: 25.0000 mg | ORAL_TABLET | Freq: Every day | ORAL | 6 refills | Status: DC

## 2018-06-24 DIAGNOSIS — G8929 Other chronic pain: Secondary | ICD-10-CM | POA: Diagnosis not present

## 2018-06-24 DIAGNOSIS — M549 Dorsalgia, unspecified: Secondary | ICD-10-CM | POA: Diagnosis not present

## 2018-06-24 DIAGNOSIS — G894 Chronic pain syndrome: Secondary | ICD-10-CM | POA: Diagnosis not present

## 2018-06-24 DIAGNOSIS — Z79899 Other long term (current) drug therapy: Secondary | ICD-10-CM | POA: Diagnosis not present

## 2018-06-30 ENCOUNTER — Other Ambulatory Visit: Payer: Self-pay

## 2018-06-30 DIAGNOSIS — E1165 Type 2 diabetes mellitus with hyperglycemia: Principal | ICD-10-CM

## 2018-06-30 DIAGNOSIS — Z794 Long term (current) use of insulin: Principal | ICD-10-CM

## 2018-06-30 DIAGNOSIS — IMO0001 Reserved for inherently not codable concepts without codable children: Secondary | ICD-10-CM

## 2018-06-30 MED ORDER — SITAGLIPTIN PHOSPHATE 50 MG PO TABS: 50.0000 mg | ORAL_TABLET | Freq: Every day | ORAL | 2 refills | Status: DC

## 2018-07-03 ENCOUNTER — Other Ambulatory Visit: Payer: Self-pay | Admitting: Internal Medicine

## 2018-07-03 DIAGNOSIS — G894 Chronic pain syndrome: Secondary | ICD-10-CM

## 2018-07-11 ENCOUNTER — Inpatient Hospital Stay (HOSPITAL_COMMUNITY)
Admission: EM | Admit: 2018-07-11 | Discharge: 2018-07-13 | DRG: 638 | Disposition: A | Discharge status: Home / Self Care | Payer: Medicaid Other | Attending: Internal Medicine | Admitting: Internal Medicine

## 2018-07-11 ENCOUNTER — Encounter (HOSPITAL_COMMUNITY): Payer: Self-pay

## 2018-07-11 ENCOUNTER — Emergency Department (HOSPITAL_COMMUNITY): Payer: Medicaid Other

## 2018-07-11 ENCOUNTER — Other Ambulatory Visit: Payer: Self-pay

## 2018-07-11 ENCOUNTER — Emergency Department (HOSPITAL_BASED_OUTPATIENT_CLINIC_OR_DEPARTMENT_OTHER): Payer: Medicaid Other

## 2018-07-11 DIAGNOSIS — I152 Hypertension secondary to endocrine disorders: Secondary | ICD-10-CM | POA: Diagnosis present

## 2018-07-11 DIAGNOSIS — M419 Scoliosis, unspecified: Secondary | ICD-10-CM | POA: Diagnosis present

## 2018-07-11 DIAGNOSIS — Z888 Allergy status to other drugs, medicaments and biological substances status: Secondary | ICD-10-CM | POA: Diagnosis not present

## 2018-07-11 DIAGNOSIS — Z91048 Other nonmedicinal substance allergy status: Secondary | ICD-10-CM | POA: Diagnosis not present

## 2018-07-11 DIAGNOSIS — I10 Essential (primary) hypertension: Secondary | ICD-10-CM

## 2018-07-11 DIAGNOSIS — R52 Pain, unspecified: Secondary | ICD-10-CM

## 2018-07-11 DIAGNOSIS — E1165 Type 2 diabetes mellitus with hyperglycemia: Secondary | ICD-10-CM | POA: Diagnosis present

## 2018-07-11 DIAGNOSIS — G894 Chronic pain syndrome: Secondary | ICD-10-CM | POA: Diagnosis present

## 2018-07-11 DIAGNOSIS — E785 Hyperlipidemia, unspecified: Secondary | ICD-10-CM

## 2018-07-11 DIAGNOSIS — L0390 Cellulitis, unspecified: Secondary | ICD-10-CM

## 2018-07-11 DIAGNOSIS — Z833 Family history of diabetes mellitus: Secondary | ICD-10-CM

## 2018-07-11 DIAGNOSIS — M79605 Pain in left leg: Secondary | ICD-10-CM | POA: Diagnosis not present

## 2018-07-11 DIAGNOSIS — Z885 Allergy status to narcotic agent status: Secondary | ICD-10-CM

## 2018-07-11 DIAGNOSIS — Z794 Long term (current) use of insulin: Secondary | ICD-10-CM

## 2018-07-11 DIAGNOSIS — L089 Local infection of the skin and subcutaneous tissue, unspecified: Secondary | ICD-10-CM | POA: Diagnosis present

## 2018-07-11 DIAGNOSIS — L03116 Cellulitis of left lower limb: Secondary | ICD-10-CM | POA: Diagnosis present

## 2018-07-11 DIAGNOSIS — N289 Disorder of kidney and ureter, unspecified: Secondary | ICD-10-CM | POA: Diagnosis present

## 2018-07-11 DIAGNOSIS — E11628 Type 2 diabetes mellitus with other skin complications: Secondary | ICD-10-CM | POA: Diagnosis present

## 2018-07-11 DIAGNOSIS — E11621 Type 2 diabetes mellitus with foot ulcer: Secondary | ICD-10-CM | POA: Diagnosis present

## 2018-07-11 DIAGNOSIS — M7989 Other specified soft tissue disorders: Secondary | ICD-10-CM | POA: Diagnosis not present

## 2018-07-11 DIAGNOSIS — E1169 Type 2 diabetes mellitus with other specified complication: Secondary | ICD-10-CM | POA: Diagnosis not present

## 2018-07-11 DIAGNOSIS — IMO0002 Reserved for concepts with insufficient information to code with codable children: Secondary | ICD-10-CM | POA: Diagnosis present

## 2018-07-11 DIAGNOSIS — E1129 Type 2 diabetes mellitus with other diabetic kidney complication: Secondary | ICD-10-CM | POA: Diagnosis present

## 2018-07-11 DIAGNOSIS — K219 Gastro-esophageal reflux disease without esophagitis: Secondary | ICD-10-CM | POA: Diagnosis present

## 2018-07-11 DIAGNOSIS — Z8249 Family history of ischemic heart disease and other diseases of the circulatory system: Secondary | ICD-10-CM

## 2018-07-11 DIAGNOSIS — L97529 Non-pressure chronic ulcer of other part of left foot with unspecified severity: Secondary | ICD-10-CM | POA: Diagnosis present

## 2018-07-11 DIAGNOSIS — E119 Type 2 diabetes mellitus without complications: Secondary | ICD-10-CM | POA: Diagnosis present

## 2018-07-11 DIAGNOSIS — L03032 Cellulitis of left toe: Secondary | ICD-10-CM | POA: Diagnosis not present

## 2018-07-11 DIAGNOSIS — Z79899 Other long term (current) drug therapy: Secondary | ICD-10-CM | POA: Diagnosis not present

## 2018-07-11 DIAGNOSIS — Z6841 Body Mass Index (BMI) 40.0 and over, adult: Secondary | ICD-10-CM

## 2018-07-11 DIAGNOSIS — Z981 Arthrodesis status: Secondary | ICD-10-CM

## 2018-07-11 DIAGNOSIS — Z823 Family history of stroke: Secondary | ICD-10-CM

## 2018-07-11 LAB — CBC WITH DIFFERENTIAL/PLATELET
ABS IMMATURE GRANULOCYTES: 0.03 10*3/uL (ref 0.00–0.07)
BASOS PCT: 0 %
Basophils Absolute: 0 10*3/uL (ref 0.0–0.1)
Eosinophils Absolute: 0.2 10*3/uL (ref 0.0–0.5)
Eosinophils Relative: 3 %
HCT: 33.9 % — ABNORMAL LOW (ref 36.0–46.0)
Hemoglobin: 10.8 g/dL — ABNORMAL LOW (ref 12.0–15.0)
Immature Granulocytes: 0 %
Lymphocytes Relative: 31 %
Lymphs Abs: 2.3 10*3/uL (ref 0.7–4.0)
MCH: 28.5 pg (ref 26.0–34.0)
MCHC: 31.9 g/dL (ref 30.0–36.0)
MCV: 89.4 fL (ref 80.0–100.0)
Monocytes Absolute: 0.5 10*3/uL (ref 0.1–1.0)
Monocytes Relative: 7 %
Neutro Abs: 4.4 10*3/uL (ref 1.7–7.7)
Neutrophils Relative %: 59 %
Platelets: 192 10*3/uL (ref 150–400)
RBC: 3.79 MIL/uL — ABNORMAL LOW (ref 3.87–5.11)
RDW: 13.5 % (ref 11.5–15.5)
WBC: 7.5 10*3/uL (ref 4.0–10.5)
nRBC: 0 % (ref 0.0–0.2)

## 2018-07-11 LAB — COMPREHENSIVE METABOLIC PANEL
ALT: 27 U/L (ref 0–44)
ANION GAP: 9 (ref 5–15)
AST: 23 U/L (ref 15–41)
Albumin: 3.6 g/dL (ref 3.5–5.0)
Alkaline Phosphatase: 69 U/L (ref 38–126)
BUN: 14 mg/dL (ref 6–20)
CO2: 29 mmol/L (ref 22–32)
Calcium: 9.2 mg/dL (ref 8.9–10.3)
Chloride: 99 mmol/L (ref 98–111)
Creatinine, Ser: 0.67 mg/dL (ref 0.44–1.00)
GFR calc Af Amer: >60 mL/min (ref >60)
GFR calc non Af Amer: >60 mL/min (ref >60)
Glucose, Bld: 186 mg/dL — ABNORMAL HIGH (ref 70–99)
Potassium: 3.8 mmol/L (ref 3.5–5.1)
Sodium: 137 mmol/L (ref 135–145)
Total Bilirubin: 0.6 mg/dL (ref 0.3–1.2)
Total Protein: 7.2 g/dL (ref 6.5–8.1)

## 2018-07-11 LAB — SEDIMENTATION RATE: Sed Rate: 44 mm/hr — ABNORMAL HIGH (ref 0–22)

## 2018-07-11 LAB — GLUCOSE, CAPILLARY: GLUCOSE-CAPILLARY: 216 mg/dL — AB (ref 70–99)

## 2018-07-11 LAB — C-REACTIVE PROTEIN: CRP: 2.7 mg/dL — ABNORMAL HIGH (ref <1.0)

## 2018-07-11 LAB — I-STAT CG4 LACTIC ACID, ED
LACTIC ACID, VENOUS: 1.16 mmol/L (ref 0.5–1.9)
Lactic Acid, Venous: 1.08 mmol/L (ref 0.5–1.9)

## 2018-07-11 LAB — I-STAT BETA HCG BLOOD, ED (MC, WL, AP ONLY): I-stat hCG, quantitative: <5 m[IU]/mL (ref <5)

## 2018-07-11 MED ORDER — VANCOMYCIN HCL 10 G IV SOLR
2000.0000 mg | Freq: Once | INTRAVENOUS | Status: AC
  Administered 2018-07-11: 2000 mg via INTRAVENOUS
  Filled 2018-07-11: qty 2000

## 2018-07-11 MED ORDER — ACETAMINOPHEN 325 MG PO TABS: 650.0000 mg | ORAL_TABLET | Freq: Four times a day (QID) | ORAL | Status: DC | PRN

## 2018-07-11 MED ORDER — ACETAMINOPHEN 650 MG RE SUPP: 650.0000 mg | Freq: Four times a day (QID) | RECTAL | Status: DC | PRN

## 2018-07-11 MED ORDER — MORPHINE SULFATE 15 MG PO TABS
15.0000 mg | ORAL_TABLET | Freq: Two times a day (BID) | ORAL | Status: DC
  Administered 2018-07-12 – 2018-07-13 (×3): 15 mg via ORAL
  Filled 2018-07-11 (×3): qty 1

## 2018-07-11 MED ORDER — AMITRIPTYLINE HCL 25 MG PO TABS
25.0000 mg | ORAL_TABLET | Freq: Every day | ORAL | Status: DC
  Administered 2018-07-11 – 2018-07-12 (×2): 25 mg via ORAL
  Filled 2018-07-11 (×2): qty 1

## 2018-07-11 MED ORDER — CARVEDILOL 3.125 MG PO TABS
3.1250 mg | ORAL_TABLET | Freq: Two times a day (BID) | ORAL | Status: DC
  Administered 2018-07-12 – 2018-07-13 (×3): 3.125 mg via ORAL
  Filled 2018-07-11 (×3): qty 1

## 2018-07-11 MED ORDER — GABAPENTIN 300 MG PO CAPS
300.0000 mg | ORAL_CAPSULE | Freq: Three times a day (TID) | ORAL | Status: DC
  Administered 2018-07-11 – 2018-07-13 (×5): 300 mg via ORAL
  Filled 2018-07-11 (×5): qty 1

## 2018-07-11 MED ORDER — METRONIDAZOLE IN NACL 5-0.79 MG/ML-% IV SOLN
500.0000 mg | Freq: Three times a day (TID) | INTRAVENOUS | Status: DC
  Administered 2018-07-12 – 2018-07-13 (×5): 500 mg via INTRAVENOUS
  Filled 2018-07-11 (×5): qty 100

## 2018-07-11 MED ORDER — ENOXAPARIN SODIUM 40 MG/0.4ML ~~LOC~~ SOLN
40.0000 mg | SUBCUTANEOUS | Status: DC
  Administered 2018-07-11 – 2018-07-12 (×2): 40 mg via SUBCUTANEOUS
  Filled 2018-07-11 (×2): qty 0.4

## 2018-07-11 MED ORDER — DICLOFENAC SODIUM 75 MG PO TBEC: 75.0000 mg | DELAYED_RELEASE_TABLET | Freq: Two times a day (BID) | ORAL | Status: DC

## 2018-07-11 MED ORDER — ATORVASTATIN CALCIUM 10 MG PO TABS
10.0000 mg | ORAL_TABLET | Freq: Every day | ORAL | Status: DC
  Administered 2018-07-12 – 2018-07-13 (×2): 10 mg via ORAL
  Filled 2018-07-11 (×2): qty 1

## 2018-07-11 MED ORDER — LISINOPRIL 20 MG PO TABS
40.0000 mg | ORAL_TABLET | Freq: Every day | ORAL | Status: DC
  Administered 2018-07-12 – 2018-07-13 (×2): 40 mg via ORAL
  Filled 2018-07-11 (×2): qty 2

## 2018-07-11 MED ORDER — VANCOMYCIN HCL 10 G IV SOLR
1250.0000 mg | Freq: Two times a day (BID) | INTRAVENOUS | Status: DC
  Administered 2018-07-12 – 2018-07-13 (×3): 1250 mg via INTRAVENOUS
  Filled 2018-07-11 (×3): qty 1250

## 2018-07-11 MED ORDER — INSULIN ASPART 100 UNIT/ML ~~LOC~~ SOLN: 8.0000 [IU] | Freq: Three times a day (TID) | SUBCUTANEOUS | Status: DC

## 2018-07-11 MED ORDER — MORPHINE SULFATE (PF) 4 MG/ML IV SOLN
4.0000 mg | Freq: Once | INTRAVENOUS | Status: AC
  Administered 2018-07-11: 4 mg via INTRAVENOUS
  Filled 2018-07-11: qty 1

## 2018-07-11 MED ORDER — DICLOFENAC SODIUM 75 MG PO TBEC
75.0000 mg | DELAYED_RELEASE_TABLET | Freq: Two times a day (BID) | ORAL | Status: DC | PRN
  Administered 2018-07-13: 75 mg via ORAL
  Filled 2018-07-11 (×2): qty 1

## 2018-07-11 MED ORDER — INSULIN ASPART 100 UNIT/ML ~~LOC~~ SOLN
0.0000 [IU] | Freq: Three times a day (TID) | SUBCUTANEOUS | Status: DC
  Administered 2018-07-12: 8 [IU] via SUBCUTANEOUS
  Administered 2018-07-12: 5 [IU] via SUBCUTANEOUS
  Administered 2018-07-12: 8 [IU] via SUBCUTANEOUS
  Administered 2018-07-13: 11 [IU] via SUBCUTANEOUS

## 2018-07-11 MED ORDER — INSULIN ASPART 100 UNIT/ML ~~LOC~~ SOLN
0.0000 [IU] | Freq: Every day | SUBCUTANEOUS | Status: DC
  Administered 2018-07-11 – 2018-07-12 (×2): 2 [IU] via SUBCUTANEOUS

## 2018-07-11 MED ORDER — HYDROCHLOROTHIAZIDE 25 MG PO TABS
12.5000 mg | ORAL_TABLET | Freq: Every day | ORAL | Status: DC
  Administered 2018-07-12 – 2018-07-13 (×2): 12.5 mg via ORAL
  Filled 2018-07-11 (×2): qty 1

## 2018-07-11 MED ORDER — MORPHINE SULFATE 15 MG PO TABS: 15.0000 mg | ORAL_TABLET | Freq: Two times a day (BID) | ORAL | Status: DC

## 2018-07-11 MED ORDER — SODIUM CHLORIDE 0.9 % IV SOLN
1.0000 g | INTRAVENOUS | Status: DC
  Administered 2018-07-12: 1 g via INTRAVENOUS
  Filled 2018-07-11 (×2): qty 10

## 2018-07-11 MED ORDER — INSULIN GLARGINE 100 UNIT/ML ~~LOC~~ SOLN
30.0000 [IU] | Freq: Every day | SUBCUTANEOUS | Status: DC
  Administered 2018-07-11: 30 [IU] via SUBCUTANEOUS
  Filled 2018-07-11: qty 0.3

## 2018-07-11 MED ORDER — BUPRENORPHINE HCL 300 MCG BU FILM: 300.0000 ug | ORAL_FILM | Freq: Two times a day (BID) | BUCCAL | Status: DC

## 2018-07-11 NOTE — Progress Notes (Signed)
Left lower extremity venous duplex and ABI have been completed.   Preliminary results in CV Proc.   Abram Sander 07/11/2018 6:21 PM

## 2018-07-11 NOTE — ED Provider Notes (Signed)
Emergency Department Provider Note   I have reviewed the triage vital signs and the nursing notes.   HISTORY  Chief Complaint Diabetic Ulcer   HPI Briana Williams is a 37 y.o. female with PMH of HLD, IDDM, HTN, and arthritis to the emergency department for evaluation of right toe wound worsening over the past week.  She describes sloughing of the skin, bleeding, drainage.  She states that her mother died 74 month ago and she is felt distracted after that loss.  She has been compliant with her insulin but has noticed that her blood sugars have been running very high.  Patient states she initially injured her toe by running into the leg of a desk.  There was a "blood blister" than then broke open and has progressed to what it is today. No fever but patient has had left leg swelling and redness worsening. She had a DVT US down at [facility] within the last 2 weeks which was negative for DVT by patient's report. No fever/chills. No severe pain in the foot but leg feels "tight."    Past Medical History:  Diagnosis Date  . Arthritis   . GERD (gastroesophageal reflux disease)   . Headache    migraine 2 weeks ago  . Hyperlipidemia   . Hypertension associated with diabetes ([place])   . Renal disorder    has both kidneys, compacted and 'squashed together"  . Scoliosis   . Sleep apnea    in the midst of being tested for it.   waiting on scheduling   . Type 2 diabetes mellitus treated with insulin ([place])    dx 2000    Patient Active Problem List   Diagnosis Date Noted  . Toe infection 07/11/2018  . Muscle spasm of back 04/24/2017  . Osteoarthritis of both hips 01/15/2017  . Recurrent headache 01/06/2017  . Irregular menstrual cycle 05/30/2016  . Chronic pain syndrome 09/21/2015  . HYPERTRIGLYCERIDEMIA 05/02/2009  . 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 Insomnia 05/01/2009  . SCOLIOSIS 05/18/2008  . Diabetes type 2, uncontrolled ([place]) 04/18/2008  . DEPRESSION 04/18/2008  .  Essential hypertension, benign 04/18/2008    Past Surgical History:  Procedure Laterality Date  . BACK SURGERY    . CERVICAL FUSION    . frontal closed sutures to forehead    . Harrington Rod placement    . HIP SURGERY     staph infection  . LUMBAR FUSION    . RADIOLOGY WITH ANESTHESIA N/A 02/25/2017   Procedure: RADIOLOGY WITH ANESTHESIA/MRI THORACIC SPINE AND LEFT SHOULDER WITHOUT CONTRAST.;  Surgeon: Radiologist, Medication, MD;  Location: [facility];  Service: Radiology;  Laterality: N/A;    Allergies Hydrocodone; Metformin and related; Norvasc [amlodipine besylate]; and Ultrasound gel  Family History  Problem Relation Age of Onset  . Cancer Maternal Grandmother        mandibular cancer   . Diabetes Mother   . Heart failure Mother   . CAD Mother        triple CABG  . Diabetes Maternal Aunt   . Stroke Father     Social History Social History   Tobacco Use  . Smoking status: Never Smoker  . Smokeless tobacco: Never Used  Substance Use Topics  . Alcohol use: No  . Drug use: No    Review of Systems  Constitutional: No fever/chills Eyes: No visual changes. ENT: No sore throat. Cardiovascular: Denies chest pain. Respiratory: Denies shortness of breath. Gastrointestinal: No abdominal pain.  No nausea,  no vomiting.  No diarrhea.  No constipation. Genitourinary: Negative for dysuria. Musculoskeletal: Negative for back pain. Skin: Left leg/foot pain with toe wound.  Neurological: Negative for headaches, focal weakness or numbness.  10-point ROS otherwise negative.  ____________________________________________   PHYSICAL EXAM:  VITAL SIGNS: ED Triage Vitals  Enc Vitals Group     BP 07/11/18 1604 (!) 157/81     Pulse Rate 07/11/18 1604 81     Resp 07/11/18 1604 16     Temp 07/11/18 1604 98.1 F (36.7 C)     Temp Source 07/11/18 1604 Oral     SpO2 07/11/18 1604 98 %     Weight 07/11/18 1605 230 lb (104.3 kg)     Height 07/11/18 1605 4' 8.5" (1.435 m)      Pain Score 07/11/18 1608 9   Constitutional: Alert and oriented. Well appearing and in no acute distress. Eyes: Conjunctivae are normal.  Head: Atraumatic. Nose: No congestion/rhinnorhea. Mouth/Throat: Mucous membranes are moist.  Neck: No stridor.   Cardiovascular: Normal rate, regular rhythm. Good peripheral circulation. Grossly normal heart sounds.   Respiratory: Normal respiratory effort.  No retractions. Lungs CTAB. Gastrointestinal: Soft and nontender. No distention.  Musculoskeletal: Left leg edema diffusely with mild erythema worse over the dorsal foot. Left great toe wound pictured with drainage and skin sloughing noted. No crepitus.  Neurologic:  Normal speech and language. No gross focal neurologic deficits are appreciated.  Skin:  Skin is warm and dry. Skin sloughing over the left great toe pictured.       ____________________________________________   LABS (all labs ordered are listed, but only abnormal results are displayed)  Labs Reviewed  COMPREHENSIVE METABOLIC PANEL - Abnormal; Notable for the following components:      Result Value   Glucose, Bld 186 (*)    All other components within normal limits  CBC WITH DIFFERENTIAL/PLATELET - Abnormal; Notable for the following components:   RBC 3.79 (*)    Hemoglobin 10.8 (*)    HCT 33.9 (*)    All other components within normal limits  C-REACTIVE PROTEIN - Abnormal; Notable for the following components:   CRP 2.7 (*)    All other components within normal limits  SEDIMENTATION RATE  COMPREHENSIVE METABOLIC PANEL  CBC  HIV ANTIBODY (ROUTINE TESTING W REFLEX)  I-STAT CG4 LACTIC ACID, ED  I-STAT BETA HCG BLOOD, ED (MC, WL, AP ONLY)  I-STAT CG4 LACTIC ACID, ED   ____________________________________________  RADIOLOGY  Dg Foot Complete Left  Result Date: 07/11/2018 CLINICAL DATA:  LEFT great toe infection, history diabetes mellitus, wound EXAM: LEFT FOOT - COMPLETE 3+ VIEW COMPARISON:  None FINDINGS: Osseous  mineralization normal. Soft tissue swelling at great toe and at dorsum of foot. Joint spaces preserved. No acute fracture, dislocation, or bone destruction. No definite soft tissue gas identified. IMPRESSION: Soft tissue swelling without acute osseous abnormalities. Electronically Signed   By: Lavonia Dana M.D.   On: 07/11/2018 17:36   Vas [name] Burnard Bunting With/wo Tbi  Result Date: 07/11/2018 LOWER EXTREMITY DOPPLER STUDY Indications: Ulceration.  Performing Technologist: Abram Sander RVS  Examination Guidelines: A complete evaluation includes at minimum, Doppler waveform signals and systolic blood pressure reading at the level of bilateral brachial, anterior tibial, and posterior tibial arteries, when vessel segments are accessible. Bilateral testing is considered an integral part of a complete examination. Photoelectric Plethysmograph (PPG) waveforms and toe systolic pressure readings are included as required and additional duplex testing as needed. Limited examinations for reoccurring indications may be  performed as noted.  ABI Findings: +--------+------------------+-----+---------+--------+ Right   Rt Pressure (mmHg)IndexWaveform Comment  +--------+------------------+-----+---------+--------+ [id number]                    triphasic         +--------+------------------+-----+---------+--------+ PTA     194               1.13 triphasic         +--------+------------------+-----+---------+--------+ DP      209               1.22 triphasic         +--------+------------------+-----+---------+--------+ +--------+------------------+-----+---------+-------+ Left    Lt Pressure (mmHg)IndexWaveform Comment +--------+------------------+-----+---------+-------+ [id number]                    triphasic        +--------+------------------+-----+---------+-------+ PTA     202               1.17 triphasic        +--------+------------------+-----+---------+-------+ DP      218                1.27 triphasic        +--------+------------------+-----+---------+-------+ +-------+-----------+-----------+------------+------------+ ABI/TBIToday's ABIToday's TBIPrevious ABIPrevious TBI +-------+-----------+-----------+------------+------------+ Right  1.22                                           +-------+-----------+-----------+------------+------------+ Left   1.27                                           +-------+-----------+-----------+------------+------------+  Summary: Right: Resting right ankle-brachial index is within normal range. No evidence of significant right lower extremity arterial disease. Left: Resting left ankle-brachial index is within normal range. No evidence of significant left lower extremity arterial disease.  *See table(s) above for measurements and observations.    Preliminary    Vas [name] Lower Extremity Venous (dvt) (mc And W[facility] 7a-7p)  Result Date: 07/11/2018  Lower Venous Study Indications: Pain.  Performing Technologist: Abram Sander RVS  Examination Guidelines: A complete evaluation includes B-mode imaging, spectral Doppler, color Doppler, and power Doppler as needed of all accessible portions of each vessel. Bilateral testing is considered an integral part of a complete examination. Limited examinations for reoccurring indications may be performed as noted.  Right Venous Findings: +---+---------------+---------+-----------+----------+-------+    CompressibilityPhasicitySpontaneityPropertiesSummary +---+---------------+---------+-----------+----------+-------+ CFVFull           Yes      Yes                          +---+---------------+---------+-----------+----------+-------+  Left Venous Findings: +---------+---------------+---------+-----------+----------+--------------+          CompressibilityPhasicitySpontaneityPropertiesSummary        +---------+---------------+---------+-----------+----------+--------------+ CFV      Full            Yes      Yes                                 +---------+---------------+---------+-----------+----------+--------------+ SFJ      Full                                                        +---------+---------------+---------+-----------+----------+--------------+  FV Prox  Full                                                        +---------+---------------+---------+-----------+----------+--------------+ FV Mid   Full                                                        +---------+---------------+---------+-----------+----------+--------------+ FV DistalFull                                                        +---------+---------------+---------+-----------+----------+--------------+ PFV      Full                                                        +---------+---------------+---------+-----------+----------+--------------+ POP      Full           Yes      Yes                                 +---------+---------------+---------+-----------+----------+--------------+ PTV      Full                                                        +---------+---------------+---------+-----------+----------+--------------+ PERO                                                  Not visualized +---------+---------------+---------+-----------+----------+--------------+    Summary: Right: No evidence of common femoral vein obstruction. Left: There is no evidence of deep vein thrombosis in the lower extremity. No cystic structure found in the popliteal fossa.  *See table(s) above for measurements and observations.    Preliminary     ____________________________________________   PROCEDURES  Procedure(s) performed:   Procedures  None ____________________________________________   INITIAL IMPRESSION / ASSESSMENT AND PLAN / ED COURSE  Pertinent labs & imaging results that were available during my care of the patient were reviewed by me and  considered in my medical decision making (see chart for details).  Patient presents to the emergency department with left toe wound.  She has diffuse left lower extremity swelling with erythema.  Plan for plain film of the left foot to evaluate for possible osteomyelitis.  The toe appears somewhat gangrenous.  Normal lactate and no white blood cell count,   Plain film reviewed with no obvious bony involvement. [name] results reviewed.   Discussed patient's case with IM resident to request admission. Patient and family (if present) updated with  plan. Care transferred to Medicine service.  I reviewed all nursing notes, vitals, pertinent old records, EKGs, labs, imaging (as available).  ____________________________________________  FINAL CLINICAL IMPRESSION(S) / ED DIAGNOSES  Final diagnoses:  Toe infection    MEDICATIONS GIVEN DURING THIS VISIT:  Medications  vancomycin (VANCOCIN) 2,000 mg in sodium chloride 0.9 % 500 mL IVPB (2,000 mg Intravenous Transfusing/Transfer 07/11/18 1956)  vancomycin (VANCOCIN) 1,250 mg in sodium chloride 0.9 % 250 mL IVPB (has no administration in time range)  enoxaparin (LOVENOX) injection 40 mg (has no administration in time range)  acetaminophen (TYLENOL) tablet 650 mg (has no administration in time range)    Or  acetaminophen (TYLENOL) suppository 650 mg (has no administration in time range)  amitriptyline (ELAVIL) tablet 25 mg (has no administration in time range)  atorvastatin (LIPITOR) tablet 10 mg (has no administration in time range)  Buprenorphine HCl FILM 300 mcg (has no administration in time range)  carvedilol (COREG) tablet 3.125 mg (has no administration in time range)  gabapentin (NEURONTIN) capsule 300 mg (has no administration in time range)  hydrochlorothiazide (HYDRODIURIL) tablet 12.5 mg (has no administration in time range)  lisinopril (PRINIVIL,ZESTRIL) tablet 40 mg (has no administration in time range)  insulin aspart (novoLOG)  injection 0-15 Units (has no administration in time range)  insulin aspart (novoLOG) injection 0-5 Units (has no administration in time range)  insulin glargine (LANTUS) injection 30 Units (has no administration in time range)  insulin aspart (novoLOG) injection 8 Units (has no administration in time range)  metroNIDAZOLE (FLAGYL) IVPB 500 mg (has no administration in time range)  cefTRIAXone (ROCEPHIN) 1 g in sodium chloride 0.9 % 100 mL IVPB (has no administration in time range)  diclofenac (VOLTAREN) EC tablet 75 mg (has no administration in time range)  morphine (MSIR) tablet 15 mg (has no administration in time range)  morphine 4 MG/ML injection 4 mg (4 mg Intravenous Given 07/11/18 1846)    Note:  This document was prepared using Dragon voice recognition software and may include unintentional dictation errors.  Nanda Quinton, MD Emergency Medicine    Emmajo Bennette, [facility], MD 07/11/18 2013

## 2018-07-11 NOTE — ED Triage Notes (Signed)
Pt has hx of diabetes, has wound to the left great toe. SHe states she also has a sore on her left buttocks. VItals stable, NKDA. Great toe does appear to be infected.

## 2018-07-11 NOTE — H&P (Addendum)
Date: 07/11/2018               Patient Name:  Briana Williams MRN: [id number]  DOB: 04/07/1981 Age / Sex: 37 y.o., female   PCP: Ladell Pier, MD         Medical Service: Internal Medicine Teaching Service         Attending Physician: Dr. Johnnette Gourd, MD    First Contact: Dr. Nita Sickle MD Pager: [phone]  Second Contact: Dr. Vickki Muff MD Pager: [phone]       After Hours (After 5p/  First Contact Pager: [phone]  weekends / holidays): Second Contact Pager: [phone]   Chief Complaint: Left toe pain  History of Present Illness: Briana Williams is a 37 year old female with hyperlipidemia, hypertension, poorly controlled diabetes who presents with left toe pain.  She states that 3 months ago she stubbed her toe on a piece of furniture and developed a " blood blister."  Three to 4 weeks later the blister burst and it progressively became worse with sloughing of the skin, bleeding, and drainage.  She states 1 to 2 weeks ago she began to have extreme left leg pain with small lesions on her lower extremities as well as her left buttocks.  She denies fevers, chills, night sweats.  She does have type 2 diabetes and states that she normally takes insulin (36 units Lantus in the morning at night and 24 units NovoLog  twice a day with meals).  She states that since her mother died she has not been taking care of herself as she should.  She was not taking her insulin for several months and only restarted it about 1 month ago.  Meds:  Current Meds  Medication Sig  . amitriptyline (ELAVIL) 25 MG tablet Take 1 tablet (25 mg total) by mouth at bedtime.  Marland Kitchen BYETTA 5 MCG PEN 5 MCG/0.02ML SOPN injection ADMINISTER 5 MCG UNDER THE SKIN TWICE DAILY WITH A MEAL (Patient taking differently: Inject 5 mcg into the skin 2 (two) times daily with a meal. )  . carvedilol (COREG) 3.125 MG tablet Take 1 tablet (3.125 mg total) by mouth 2 (two) times daily with a meal.  . diclofenac (VOLTAREN)  75 MG EC tablet TAKE 1 TABLET(75 MG) BY MOUTH TWICE DAILY (Patient taking differently: Take 75 mg by mouth 2 (two) times daily. )  . gabapentin (NEURONTIN) 100 MG capsule TAKE 3 CAPSULES BY MOUTH THREE TIMES DAILY (Patient taking differently: Take 300 mg by mouth 3 (three) times daily. )  . hydrochlorothiazide (HYDRODIURIL) 12.5 MG tablet Take 1 tablet (12.5 mg total) by mouth daily.  . insulin aspart (NOVOLOG FLEXPEN) 100 UNIT/ML FlexPen 15 units subcut with the two largest meals of the day (Patient taking differently: Inject 24 Units into the skin 2 (two) times daily before a meal. 15 units subcut with the two largest meals of the day)  . Insulin Glargine (LANTUS SOLOSTAR) 100 UNIT/ML Solostar Pen 32 units at bedtime.  Increase by 2 units every 2-3 days until morning BS consistently 90-130.  Max of 40 units daily (Patient taking differently: Inject 32 Units into the skin 2 (two) times daily. )  . lisinopril (PRINIVIL,ZESTRIL) 40 MG tablet TAKE 1 TABLET BY MOUTH DAILY (Patient taking differently: Take 40 mg by mouth daily. )  . sitaGLIPtin (JANUVIA) 50 MG tablet Take 1 tablet (50 mg total) by mouth daily.  Marland Kitchen tiZANidine (ZANAFLEX) 4 MG tablet Take 1 tablet (4 mg total) by mouth  every 8 (eight) hours as needed for muscle spasms. (Patient taking differently: Take 2-4 mg by mouth 4 (four) times daily. 1/2 tablet (2 mg) if going out, 1 tablet (4 mg) if at home)     Allergies: Allergies as of 07/11/2018 - Review Complete 07/11/2018  Allergen Reaction Noted  . Hydrocodone Itching 07/11/2018  . Metformin and related Other (See Comments) 04/24/2017  . Norvasc [amlodipine besylate] Swelling 10/30/2017  . Ultrasound gel Rash and Other (See Comments) 06/13/2016   Past Medical History:  Diagnosis Date  . Arthritis   . GERD (gastroesophageal reflux disease)   . Headache    migraine 2 weeks ago  . Hyperlipidemia   . Hypertension associated with diabetes ([place])   . Renal disorder    has both kidneys,  compacted and 'squashed together"  . Scoliosis   . Sleep apnea    in the midst of being tested for it.   waiting on scheduling   . Type 2 diabetes mellitus treated with insulin ([place])    dx 2000    Family History:  Family History  Problem Relation Age of Onset  . Cancer Maternal Grandmother        mandibular cancer   . Diabetes Mother   . Heart failure Mother   . CAD Mother        triple CABG  . Diabetes Maternal Aunt   . Stroke Father     Social History: She denies alcohol, tobacco, and drug use.  Review of Systems: A complete ROS was negative except as per HPI.   Physical Exam: Blood pressure (!) 157/81, pulse 81, temperature 98.1 F (36.7 C), temperature source Oral, resp. rate 16, height 4' 8.5" (1.435 m), weight 104.3 kg, SpO2 98 %. Physical Exam Vitals signs and nursing note reviewed.  HENT:     Head: Normocephalic and atraumatic.  Cardiovascular:     Rate and Rhythm: Normal rate and regular rhythm.  Pulmonary:     Effort: Pulmonary effort is normal.     Breath sounds: Normal breath sounds.  Abdominal:     General: There is no distension.     Palpations: Abdomen is soft.     Tenderness: There is no abdominal tenderness.  Musculoskeletal:     Comments: Left lower extremity: Erythema and purulence of the left big toe (see photo below).  Erythema up to the left knee.  Tenderness to palpation of the left foot and leg.  Distal pulses intact bilaterally. Right lower extremity: 1+ pitting edema without erythema or tenderness to palpation.          CXR Left Foot:  IMPRESSION: Soft tissue swelling without acute osseous abnormalities.  Assessment & Plan by Problem: Active Problems:   Toe infection  Briana Williams has diabetes and presents with a left first toe ulcer, erythema up to the knee, and tenderness to palpation of the left lower extremity concerning for cellulitis.  She does not have any signs of osteomyelitis on imaging.  I also do not suspect  bacteremia given no systemic symptoms, normal lactate, and normal white blood cell count.   Cellulitis of left lower extremity: Given her con-commitment diabetes she is at a high risk for polymicrobial infection including streptococci, S. aureus, aerobic gram-negative bacilli and anaerobes. - Ceftriaxone 1 g every 24 hours - Vancomycin 1250 mg every 12 hours - Metronidazole 500 mg every 8 hours - Tylenol 650 mg every 6 hours - Follow-up left lower extremity venous duplex and ABIs. - Follow-up HIV  Type 2 diabetes: Home regimen includes 36 units Lantus twice daily, 24 units NovoLog twice daily with meals. - Start Lantus 30 units daily - Start NovoLog 8 units 3 times daily with meals - Sliding scale insulin moderate  Hypertension: Will continue home antihypertensives per below. - Lisinopril 40 mg daily - Hydrochlorothiazide 12.5 mg daily - Labetalol 3.125 mg twice daily  CODE STATUS: Full code  Dispo: Admit patient to Observation with expected length of stay less than 2 midnights.  Signed: Carroll Sage, MD 07/11/2018, 6:13 PM  Pager: [phone]

## 2018-07-11 NOTE — ED Notes (Signed)
Nurse to call for report when available.

## 2018-07-11 NOTE — Progress Notes (Signed)
Pharmacy Antibiotic Note  Briana Williams is a 37 y.o. female admitted on 07/11/2018 with cellulitis.  Pharmacy has been consulted for vancomycin dosing. Pt presents with multiple wounds on buttocks and L grand toe.  Plan: -Vancomycin 2000mg  IV x1 then 1250mg  IV q12h -Monitor SCr, LOT, cultures -Vancomycin levels as indicated  Height: 4' 8.5" (143.5 cm) Weight: 230 lb (104.3 kg) IBW/kg (Calculated) : 37.45  Temp (24hrs), Avg:98.1 F (36.7 C), Min:98.1 F (36.7 C), Max:98.1 F (36.7 C)  Recent Labs  Lab 07/11/18 1616 07/11/18 1622  WBC 7.5  --   CREATININE 0.67  --   LATICACIDVEN  --  1.16    Estimated Creatinine Clearance: 96.6 mL/min (by C-G formula based on SCr of 0.67 mg/dL).    Allergies  Allergen Reactions  . Hydrocodone Itching    Note: benadryl does NOT help  . Metformin And Related Other (See Comments)    Causes blisters on skin  . Norvasc [Amlodipine Besylate] Swelling    Ankle swelling  . Ultrasound Gel Rash and Other (See Comments)    Patient gets a red rash with use of ultrasound gel/ burns skin    Antimicrobials this admission: Vancomycin   >>/ Dose adjustments this admission: none  Microbiology results: none  Thank you for allowing pharmacy to be a part of this patient's care.  Arrie Senate, PharmD, BCPS Clinical Pharmacist Please check AMION for all [facility] Pharm[facility] numbers 07/11/2018

## 2018-07-11 NOTE — Progress Notes (Deleted)
Date: 07/11/2018               Patient Name:  Briana Williams MRN: [id number]  DOB: 10-08-1980 Age / Sex: 37 y.o., female   PCP: Ladell Pier, MD         Medical Service: Internal Medicine Teaching Service         Attending Physician: Dr. Laverta Baltimore Wonda Olds, MD    First Contact: Dr. Nita Sickle MD Pager: [phone]  Second Contact: Dr. Vickki Muff MD Pager: [phone]       After Hours (After 5p/  First Contact Pager: [phone]  weekends / holidays): Second Contact Pager: [phone]   Chief Complaint: Left toe pain  History of Present Illness: Ms. Lubrano is a 37 year old female with hyperlipidemia, hypertension, poorly controlled diabetes who presents with left toe pain.  She states that 3 months ago she stubbed her toe on a piece of furniture and developed a " blood blister."  Three to 4 weeks later the blister burst and it progressively became worse with sloughing of the skin, bleeding, and drainage.  She states 1 to 2 weeks ago she began to have extreme left leg pain with small lesions on her lower extremities as well as her left buttocks.  She denies fevers, chills, night sweats.  She does have type 2 diabetes and states that she normally takes insulin (36 units Lantus in the morning at night and 24 units NovoLog  twice a day with meals).  She states that since her mother died she has not been taking care of herself as she should.  She was not taking her insulin for several months and only restarted it about 1 month ago.  Meds:  Current Meds  Medication Sig  . amitriptyline (ELAVIL) 25 MG tablet Take 1 tablet (25 mg total) by mouth at bedtime.  Marland Kitchen BYETTA 5 MCG PEN 5 MCG/0.02ML SOPN injection ADMINISTER 5 MCG UNDER THE SKIN TWICE DAILY WITH A MEAL (Patient taking differently: Inject 5 mcg into the skin 2 (two) times daily with a meal. )  . carvedilol (COREG) 3.125 MG tablet Take 1 tablet (3.125 mg total) by mouth 2 (two) times daily with a meal.  . diclofenac (VOLTAREN)  75 MG EC tablet TAKE 1 TABLET(75 MG) BY MOUTH TWICE DAILY (Patient taking differently: Take 75 mg by mouth 2 (two) times daily. )  . gabapentin (NEURONTIN) 100 MG capsule TAKE 3 CAPSULES BY MOUTH THREE TIMES DAILY (Patient taking differently: Take 300 mg by mouth 3 (three) times daily. )  . hydrochlorothiazide (HYDRODIURIL) 12.5 MG tablet Take 1 tablet (12.5 mg total) by mouth daily.  . insulin aspart (NOVOLOG FLEXPEN) 100 UNIT/ML FlexPen 15 units subcut with the two largest meals of the day (Patient taking differently: Inject 24 Units into the skin 2 (two) times daily before a meal. 15 units subcut with the two largest meals of the day)  . Insulin Glargine (LANTUS SOLOSTAR) 100 UNIT/ML Solostar Pen 32 units at bedtime.  Increase by 2 units every 2-3 days until morning BS consistently 90-130.  Max of 40 units daily (Patient taking differently: Inject 32 Units into the skin 2 (two) times daily. )  . lisinopril (PRINIVIL,ZESTRIL) 40 MG tablet TAKE 1 TABLET BY MOUTH DAILY (Patient taking differently: Take 40 mg by mouth daily. )  . sitaGLIPtin (JANUVIA) 50 MG tablet Take 1 tablet (50 mg total) by mouth daily.  Marland Kitchen tiZANidine (ZANAFLEX) 4 MG tablet Take 1 tablet (4 mg total) by  mouth every 8 (eight) hours as needed for muscle spasms. (Patient taking differently: Take 2-4 mg by mouth 4 (four) times daily. 1/2 tablet (2 mg) if going out, 1 tablet (4 mg) if at home)     Allergies: Allergies as of 07/11/2018 - Review Complete 07/11/2018  Allergen Reaction Noted  . Hydrocodone Itching 07/11/2018  . Metformin and related Other (See Comments) 04/24/2017  . Norvasc [amlodipine besylate] Swelling 10/30/2017  . Ultrasound gel Rash and Other (See Comments) 06/13/2016   Past Medical History:  Diagnosis Date  . Arthritis   . GERD (gastroesophageal reflux disease)   . Headache    migraine 2 weeks ago  . Hyperlipidemia   . Hypertension associated with diabetes ([place])   . Renal disorder    has both kidneys,  compacted and 'squashed together"  . Scoliosis   . Sleep apnea    in the midst of being tested for it.   waiting on scheduling   . Type 2 diabetes mellitus treated with insulin ([place])    dx 2000    Family History:  Family History  Problem Relation Age of Onset  . Cancer Maternal Grandmother        mandibular cancer   . Diabetes Mother   . Heart failure Mother   . CAD Mother        triple CABG  . Diabetes Maternal Aunt   . Stroke Father     Social History: She denies alcohol, tobacco, and drug use.  Review of Systems: A complete ROS was negative except as per HPI.   Physical Exam: Blood pressure (!) 157/81, pulse 81, temperature 98.1 F (36.7 C), temperature source Oral, resp. rate 16, height 4' 8.5" (1.435 m), weight 104.3 kg, SpO2 98 %. Physical Exam Vitals signs and nursing note reviewed.  HENT:     Head: Normocephalic and atraumatic.  Cardiovascular:     Rate and Rhythm: Normal rate and regular rhythm.  Pulmonary:     Effort: Pulmonary effort is normal.     Breath sounds: Normal breath sounds.  Abdominal:     General: There is no distension.     Palpations: Abdomen is soft.     Tenderness: There is no abdominal tenderness.  Musculoskeletal:     Comments: Left lower extremity: Erythema and purulence of the left big toe (see photo below).  Erythema up to the left knee.  Tenderness to palpation of the left foot and leg.  Distal pulses intact bilaterally. Right lower extremity: 1+ pitting edema without erythema or tenderness to palpation.          CXR Left Foot:  IMPRESSION: Soft tissue swelling without acute osseous abnormalities.  Assessment & Plan by Problem: Active Problems:   Toe infection  Ms. Spiker has diabetes and presents with a left first toe ulcer, erythema up to the knee, and tenderness to palpation of the left lower extremity concerning for cellulitis.  I do not suspect bacteremia given no systemic symptoms, normal lactate, and lack of  white blood cell count.   Cellulitis of left lower extremity: Given her con commitment diabetes she has had a high risk for a polymicrobial infection including streptococci, S. aureus, aerobic gram-negative bacilli and anaerobes. - Ceftriaxone 1 g every 24 hours - Vancomycin 1250 mg every 12 hours - Metronidazole 500 mg every 8 hours - Tylenol 650 mg every 6 hours - Follow-up left lower extremity venous duplex and ABIs. - Follow-up HIV  Type 2 diabetes: Home regimen includes 36 units  Lantus twice daily, 24 units NovoLog twice daily with meals. - Start Lantus 30 units daily -Start NovoLog 8 units 3 times daily with meals -Sliding scale insulin moderate  Hypertension: Will continue home irrigations per below. - Lisinopril 40 mg daily - Hydrochlorothiazide 12.5 mg daily - Labetalol 3.125 mg twice daily  Dispo: Admit patient to Observation with expected length of stay less than 2 midnights.  Signed: Carroll Sage, MD 07/11/2018, 6:13 PM  Pager: [phone]

## 2018-07-11 NOTE — ED Notes (Signed)
Pt presents to ED with multiple dime sized wounds on l leg

## 2018-07-12 ENCOUNTER — Other Ambulatory Visit: Payer: Self-pay

## 2018-07-12 DIAGNOSIS — Z823 Family history of stroke: Secondary | ICD-10-CM | POA: Diagnosis not present

## 2018-07-12 DIAGNOSIS — M7989 Other specified soft tissue disorders: Secondary | ICD-10-CM | POA: Diagnosis not present

## 2018-07-12 DIAGNOSIS — E1169 Type 2 diabetes mellitus with other specified complication: Secondary | ICD-10-CM | POA: Diagnosis not present

## 2018-07-12 DIAGNOSIS — E11621 Type 2 diabetes mellitus with foot ulcer: Secondary | ICD-10-CM | POA: Diagnosis present

## 2018-07-12 DIAGNOSIS — E118 Type 2 diabetes mellitus with unspecified complications: Secondary | ICD-10-CM | POA: Diagnosis not present

## 2018-07-12 DIAGNOSIS — Z981 Arthrodesis status: Secondary | ICD-10-CM | POA: Diagnosis not present

## 2018-07-12 DIAGNOSIS — Z8249 Family history of ischemic heart disease and other diseases of the circulatory system: Secondary | ICD-10-CM | POA: Diagnosis not present

## 2018-07-12 DIAGNOSIS — L089 Local infection of the skin and subcutaneous tissue, unspecified: Secondary | ICD-10-CM | POA: Diagnosis not present

## 2018-07-12 DIAGNOSIS — E785 Hyperlipidemia, unspecified: Secondary | ICD-10-CM | POA: Diagnosis not present

## 2018-07-12 DIAGNOSIS — Z833 Family history of diabetes mellitus: Secondary | ICD-10-CM | POA: Diagnosis not present

## 2018-07-12 DIAGNOSIS — Z885 Allergy status to narcotic agent status: Secondary | ICD-10-CM | POA: Diagnosis not present

## 2018-07-12 DIAGNOSIS — E11628 Type 2 diabetes mellitus with other skin complications: Secondary | ICD-10-CM | POA: Diagnosis present

## 2018-07-12 DIAGNOSIS — Z6841 Body Mass Index (BMI) 40.0 and over, adult: Secondary | ICD-10-CM | POA: Diagnosis not present

## 2018-07-12 DIAGNOSIS — I10 Essential (primary) hypertension: Secondary | ICD-10-CM | POA: Diagnosis not present

## 2018-07-12 DIAGNOSIS — M79605 Pain in left leg: Secondary | ICD-10-CM | POA: Diagnosis not present

## 2018-07-12 DIAGNOSIS — G894 Chronic pain syndrome: Secondary | ICD-10-CM | POA: Diagnosis present

## 2018-07-12 DIAGNOSIS — Z9114 Patient's other noncompliance with medication regimen: Secondary | ICD-10-CM | POA: Diagnosis not present

## 2018-07-12 DIAGNOSIS — Z79899 Other long term (current) drug therapy: Secondary | ICD-10-CM | POA: Diagnosis not present

## 2018-07-12 DIAGNOSIS — E1165 Type 2 diabetes mellitus with hyperglycemia: Secondary | ICD-10-CM | POA: Diagnosis present

## 2018-07-12 DIAGNOSIS — L03116 Cellulitis of left lower limb: Secondary | ICD-10-CM | POA: Diagnosis not present

## 2018-07-12 DIAGNOSIS — L97529 Non-pressure chronic ulcer of other part of left foot with unspecified severity: Secondary | ICD-10-CM | POA: Diagnosis present

## 2018-07-12 DIAGNOSIS — K219 Gastro-esophageal reflux disease without esophagitis: Secondary | ICD-10-CM | POA: Diagnosis present

## 2018-07-12 DIAGNOSIS — L03032 Cellulitis of left toe: Secondary | ICD-10-CM | POA: Diagnosis not present

## 2018-07-12 DIAGNOSIS — Z794 Long term (current) use of insulin: Secondary | ICD-10-CM | POA: Diagnosis not present

## 2018-07-12 DIAGNOSIS — I152 Hypertension secondary to endocrine disorders: Secondary | ICD-10-CM | POA: Diagnosis present

## 2018-07-12 DIAGNOSIS — M419 Scoliosis, unspecified: Secondary | ICD-10-CM | POA: Diagnosis present

## 2018-07-12 DIAGNOSIS — N289 Disorder of kidney and ureter, unspecified: Secondary | ICD-10-CM | POA: Diagnosis present

## 2018-07-12 DIAGNOSIS — Z888 Allergy status to other drugs, medicaments and biological substances status: Secondary | ICD-10-CM | POA: Diagnosis not present

## 2018-07-12 DIAGNOSIS — Z91048 Other nonmedicinal substance allergy status: Secondary | ICD-10-CM | POA: Diagnosis not present

## 2018-07-12 LAB — COMPREHENSIVE METABOLIC PANEL
ALT: 21 U/L (ref 0–44)
AST: 19 U/L (ref 15–41)
Albumin: 2.7 g/dL — ABNORMAL LOW (ref 3.5–5.0)
Alkaline Phosphatase: 60 U/L (ref 38–126)
Anion gap: 12 (ref 5–15)
BUN: 13 mg/dL (ref 6–20)
CO2: 26 mmol/L (ref 22–32)
CREATININE: 0.76 mg/dL (ref 0.44–1.00)
Calcium: 8.3 mg/dL — ABNORMAL LOW (ref 8.9–10.3)
Chloride: 97 mmol/L — ABNORMAL LOW (ref 98–111)
GFR calc Af Amer: >60 mL/min (ref >60)
GFR calc non Af Amer: >60 mL/min (ref >60)
Glucose, Bld: 421 mg/dL — ABNORMAL HIGH (ref 70–99)
Potassium: 4.1 mmol/L (ref 3.5–5.1)
Sodium: 135 mmol/L (ref 135–145)
Total Bilirubin: 0.4 mg/dL (ref 0.3–1.2)
Total Protein: 6.2 g/dL — ABNORMAL LOW (ref 6.5–8.1)

## 2018-07-12 LAB — CBC
HEMATOCRIT: 30.2 % — AB (ref 36.0–46.0)
Hemoglobin: 9.6 g/dL — ABNORMAL LOW (ref 12.0–15.0)
MCH: 28.7 pg (ref 26.0–34.0)
MCHC: 31.8 g/dL (ref 30.0–36.0)
MCV: 90.1 fL (ref 80.0–100.0)
Platelets: 190 10*3/uL (ref 150–400)
RBC: 3.35 MIL/uL — ABNORMAL LOW (ref 3.87–5.11)
RDW: 13.6 % (ref 11.5–15.5)
WBC: 7.5 10*3/uL (ref 4.0–10.5)
nRBC: 0 % (ref 0.0–0.2)

## 2018-07-12 LAB — GLUCOSE, CAPILLARY
Glucose-Capillary: 274 mg/dL — ABNORMAL HIGH (ref 70–99)
Glucose-Capillary: 244 mg/dL — ABNORMAL HIGH (ref 70–99)

## 2018-07-12 LAB — HIV ANTIBODY (ROUTINE TESTING W REFLEX): HIV Screen 4th Generation wRfx: NONREACTIVE

## 2018-07-12 MED ORDER — INSULIN GLARGINE 100 UNIT/ML ~~LOC~~ SOLN
30.0000 [IU] | Freq: Two times a day (BID) | SUBCUTANEOUS | Status: DC
  Administered 2018-07-12 – 2018-07-13 (×3): 30 [IU] via SUBCUTANEOUS
  Filled 2018-07-12 (×3): qty 0.3

## 2018-07-12 MED ORDER — HYDROMORPHONE HCL 1 MG/ML IJ SOLN
0.5000 mg | Freq: Once | INTRAMUSCULAR | Status: AC
  Administered 2018-07-12: 0.5 mg via INTRAVENOUS
  Filled 2018-07-12: qty 1

## 2018-07-12 MED ORDER — INSULIN ASPART 100 UNIT/ML ~~LOC~~ SOLN: 15.0000 [IU] | Freq: Three times a day (TID) | SUBCUTANEOUS | Status: DC

## 2018-07-12 MED ORDER — MORPHINE SULFATE (PF) 4 MG/ML IV SOLN
4.0000 mg | Freq: Once | INTRAVENOUS | Status: AC
  Administered 2018-07-12: 4 mg via INTRAVENOUS
  Filled 2018-07-12: qty 1

## 2018-07-12 MED ORDER — SODIUM CHLORIDE 0.9 % IV SOLN
2.0000 g | INTRAVENOUS | Status: DC
  Administered 2018-07-12: 2 g via INTRAVENOUS
  Filled 2018-07-12: qty 20

## 2018-07-12 MED ORDER — INSULIN ASPART 100 UNIT/ML ~~LOC~~ SOLN
8.0000 [IU] | Freq: Three times a day (TID) | SUBCUTANEOUS | Status: DC
  Administered 2018-07-12 – 2018-07-13 (×3): 8 [IU] via SUBCUTANEOUS

## 2018-07-12 MED ORDER — HYDROMORPHONE HCL 1 MG/ML IJ SOLN
1.0000 mg | Freq: Once | INTRAMUSCULAR | Status: AC
  Administered 2018-07-12: 1 mg via INTRAVENOUS
  Filled 2018-07-12: qty 1

## 2018-07-12 NOTE — Plan of Care (Signed)
  Problem: Clinical Measurements: Goal: Ability to avoid or minimize complications of infection will improve Outcome: Progressing   Problem: Skin Integrity: Goal: Skin integrity will improve Outcome: Progressing   Problem: Clinical Measurements: Goal: Will remain free from infection Outcome: Progressing   Problem: Activity: Goal: Risk for activity intolerance will decrease Outcome: Progressing   Problem: Nutrition: Goal: Adequate nutrition will be maintained Outcome: Progressing   Problem: Pain Managment: Goal: General experience of comfort will improve 07/12/2018 0009 by Heloise Purpura, RN Outcome: Progressing 07/12/2018 0008 by Heloise Purpura, RN Outcome: Progressing   Problem: Skin Integrity: Goal: Risk for impaired skin integrity will decrease Outcome: Progressing

## 2018-07-12 NOTE — Plan of Care (Signed)
  Problem: Clinical Measurements: Goal: Ability to avoid or minimize complications of infection will improve Outcome: Progressing   Problem: Skin Integrity: Goal: Skin integrity will improve Outcome: Progressing   Problem: Clinical Measurements: Goal: Will remain free from infection Outcome: Progressing   Problem: Activity: Goal: Risk for activity intolerance will decrease Outcome: Progressing   Problem: Nutrition: Goal: Adequate nutrition will be maintained Outcome: Progressing   Problem: Pain Managment: Goal: General experience of comfort will improve Outcome: Progressing   Problem: Skin Integrity: Goal: Risk for impaired skin integrity will decrease Outcome: Progressing

## 2018-07-12 NOTE — Progress Notes (Signed)
   Subjective: Ms. Larey Seat states that she has a significant amount of left lower extremity pain but does not feel that it has worsened since yesterday.  He otherwise does not have any other acute complaints.  Objective:  Vital signs in last 24 hours: Vitals:   07/11/18 1947 07/11/18 1955 07/11/18 2005 07/12/18 0522  BP:   (!) 166/82 (!) 102/57  Pulse: 82  83 70  Resp:   18 16  Temp:  98.4 F (36.9 C) 99 F (37.2 C) 97.8 F (36.6 C)  TempSrc:  Oral Oral Oral  SpO2: 99%  100% 98%  Weight:   108.9 kg   Height:   4' 8.5" (1.435 m)    Physical Exam Vitals signs and nursing note reviewed.  Constitutional:      Comments: Morbidly obese female lying in bed in no acute distress.  Musculoskeletal:     Comments: Left lower extremity: Left toe with small ulcer at the plantar aspect erythema, sloughing skin, without drainage.  Erythema up to the left knee.  Unchanged from yesterday.  1+ pitting edema. Right lower extremity: 1+ pitting edema.  Psychiatric:        Mood and Affect: Mood normal.        Behavior: Behavior normal.    Assessment/Plan:  Principal Problem:   Diabetic foot infection ([place]) Active Problems:   Diabetes type 2, uncontrolled ([place])   Essential hypertension, benign   Toe infection   Morbid obesity ([place])  Ms. Vigil has diabetes and presents with a left first toe ulcer, erythema up to the knee, and tenderness to palpation of the left lower extremity concerning for cellulitis.  She is being treated with ceftriaxone, vancomycin, metronidazole.  Left lower extremity venous Dopplers and ABIs normal.  She will need debridement of her toe ulcer.  Wound care has been consulted.  Cellulitis of left lower extremity:  - Continue ceftriaxone 1 g every 24 hours - Continue Vancomycin 1250 mg every 12 hours - Continue Metronidazole 500 mg every 8 hours - Continue morphine 15 mg IR twice daily and Tylenol 650 mg every 6 hours PRN  Type 2 diabetes: Home regimen  includes 36 units Lantus twice daily, 24 units NovoLog twice daily with meals.  Her blood sugars have remained elevated while on Lantus 30 units daily and NovoLog 8 units 3 times daily with meals.  We will increase her Lantus to 30 units twice daily. She will likely need an increased dose of mealtime insulin as well. - Increase Lantus 30 units to twice daily.   - Continue NovoLog 8 units 3 times daily with meals - Sliding scale insulin moderate  Hypertension: Blood pressures have remained within acceptable limits.  Will continue home antihypertensives per below. - Continue lisinopril 40 mg daily - Continue hydrochlorothiazide 12.5 mg daily - Continue labetalol 3.125 mg twice daily  CODE STATUS: Full code  Dispo: Anticipated discharge in approximately 1 to 2 days.   Carroll Sage, MD 07/12/2018, 12:48 PM Pager: [phone]

## 2018-07-12 NOTE — Plan of Care (Signed)
?  Problem: Clinical Measurements: ?Goal: Ability to avoid or minimize complications of infection will improve ?Outcome: Progressing ?  ?Problem: Skin Integrity: ?Goal: Skin integrity will improve ?Outcome: Progressing ?  ?

## 2018-07-12 NOTE — Plan of Care (Signed)
  Problem: Clinical Measurements: Goal: Ability to avoid or minimize complications of infection will improve Outcome: Progressing   Problem: Skin Integrity: Goal: Skin integrity will improve Outcome: Progressing   Problem: Clinical Measurements: Goal: Will remain free from infection Outcome: Progressing   Problem: Skin Integrity: Goal: Risk for impaired skin integrity will decrease Outcome: Progressing

## 2018-07-13 DIAGNOSIS — Z9114 Patient's other noncompliance with medication regimen: Secondary | ICD-10-CM

## 2018-07-13 DIAGNOSIS — E118 Type 2 diabetes mellitus with unspecified complications: Secondary | ICD-10-CM

## 2018-07-13 DIAGNOSIS — L03116 Cellulitis of left lower limb: Secondary | ICD-10-CM

## 2018-07-13 LAB — GLUCOSE, CAPILLARY
GLUCOSE-CAPILLARY: 365 mg/dL — AB (ref 70–99)
Glucose-Capillary: 266 mg/dL — ABNORMAL HIGH (ref 70–99)
Glucose-Capillary: 202 mg/dL — ABNORMAL HIGH (ref 70–99)

## 2018-07-13 MED ORDER — CLINDAMYCIN HCL 300 MG PO CAPS
300.0000 mg | ORAL_CAPSULE | Freq: Three times a day (TID) | ORAL | Status: DC
  Administered 2018-07-13: 300 mg via ORAL
  Filled 2018-07-13: qty 1

## 2018-07-13 MED ORDER — AMOXICILLIN-POT CLAVULANATE 875-125 MG PO TABS
1.0000 | ORAL_TABLET | Freq: Two times a day (BID) | ORAL | Status: DC
  Administered 2018-07-13: 1 via ORAL
  Filled 2018-07-13: qty 1

## 2018-07-13 MED ORDER — AMOXICILLIN-POT CLAVULANATE 875-125 MG PO TABS: 1.0000 | ORAL_TABLET | Freq: Two times a day (BID) | ORAL | 0 refills | Status: DC

## 2018-07-13 MED ORDER — CLINDAMYCIN HCL 300 MG PO CAPS: 300.0000 mg | ORAL_CAPSULE | Freq: Three times a day (TID) | ORAL | 0 refills | Status: DC

## 2018-07-13 MED ORDER — SULFAMETHOXAZOLE-TRIMETHOPRIM 800-160 MG PO TABS: 1.0000 | ORAL_TABLET | Freq: Two times a day (BID) | ORAL | 0 refills | Status: DC

## 2018-07-13 MED ORDER — MORPHINE SULFATE (PF) 4 MG/ML IV SOLN
4.0000 mg | Freq: Once | INTRAVENOUS | Status: AC
  Administered 2018-07-13: 4 mg via INTRAVENOUS
  Filled 2018-07-13: qty 1

## 2018-07-13 MED ORDER — ONDANSETRON HCL 4 MG/2ML IJ SOLN
4.0000 mg | Freq: Four times a day (QID) | INTRAMUSCULAR | Status: DC | PRN
  Administered 2018-07-13: 4 mg via INTRAVENOUS
  Filled 2018-07-13: qty 2

## 2018-07-13 NOTE — Consult Note (Signed)
[place] Nurse wound consult note Reason for Consult:Cellulitis to left great toe.  Has completed broad spectrum antibiotics and anticipates being discharged today.   Wound type:infectious, neuropathic Pressure Injury POA: NA Measurement: Plantar and lateral great toe 4 cm x 4 cm x 0.2 cm nonintact lesion with 0.2 cm maceration to periwound. She has been using a silver gel at home she reports Wound GBB:HQSUI red Drainage (amount, consistency, odor) moderate serosanguinous  No odor Periwound:maceration and erythema, swelling and pain to left lower leg.  Dressing procedure/placement/frequency:Cleanse left great toe with NS.  Apply Aquacel Ag to wound bed.  Cover with gauze and tape.  Change twice weekly. Monday and Thursday.  Send patient home with 3 sheets Aquacel Ag (ordered from supply) Will not follow at this time.  Please re-consult if needed.  Domenic Moras MSN, RN, FNP-BC [facility]ON Wound, Ostomy, Continence Nurse Pager [phone]

## 2018-07-13 NOTE — Progress Notes (Signed)
Patient c/o pain and nausea, she thinks the dilaudid makes her nauseous, notified on call doctor with new order.

## 2018-07-13 NOTE — Discharge Summary (Signed)
Name: Briana Williams MRN: [id number] DOB: 1981/02/16 37 y.o. PCP: Ladell Pier, MD  Date of Admission: 07/11/2018  3:58 PM Date of Discharge: 12/16/201912/16/2019 Attending Physician: Lenice Pressman  Discharge Diagnosis: 1. Left Lower Extremity Cellulitis   Discharge Medications: Allergies as of 07/13/2018      Reactions   Hydrocodone Itching   Note: benadryl does NOT help   Metformin And Related Other (See Comments)   Causes blisters on skin   Norvasc [amlodipine Besylate] Swelling   Ankle swelling   Ultrasound Gel Rash, Other (See Comments)   Patient gets a red rash with use of ultrasound gel/ burns skin      Medication List    TAKE these medications   ACCU-CHEK AVIVA PLUS w/Device Kit 1 Device by Does not apply route 3 (three) times daily after meals. ICD 10 E11.9   accu-chek softclix lancets 1 each by Other route 3 (three) times daily. ICD 10 E11.9   ACCU-CHEK SOFTCLIX LANCETS lancets 1 each by Other route 3 (three) times daily. ICD 10 E11.9   albuterol 108 (90 Base) MCG/ACT inhaler Commonly known as:  PROVENTIL HFA;VENTOLIN HFA Inhale 1 puff into the lungs every 6 (six) hours as needed for wheezing or shortness of breath.   amitriptyline 25 MG tablet Commonly known as:  ELAVIL Take 1 tablet (25 mg total) by mouth at bedtime.   amoxicillin-clavulanate 875-125 MG tablet Commonly known as:  AUGMENTIN Take 1 tablet by mouth 2 (two) times daily.   atorvastatin 10 MG tablet Commonly known as:  LIPITOR Take 1 tablet (10 mg total) by mouth daily.   BELBUCA 300 MCG Film Generic drug:  Buprenorphine HCl Place 300 mcg inside cheek 2 (two) times daily.   BYETTA 5 MCG PEN 5 MCG/0.02ML Sopn injection Generic drug:  exenatide ADMINISTER 5 MCG UNDER THE SKIN TWICE DAILY WITH A MEAL What changed:  See the new instructions.   carvedilol 3.125 MG tablet Commonly known as:  COREG Take 1 tablet (3.125 mg total) by mouth 2 (two) times daily with a  meal.   clonazePAM 0.5 MG tablet Commonly known as:  KLONOPIN TAKE 1 TABLET BY MOUTH DAILY AS NEEDED FOR ANXIETY   diclofenac 75 MG EC tablet Commonly known as:  VOLTAREN TAKE 1 TABLET(75 MG) BY MOUTH TWICE DAILY What changed:  Another medication with the same name was changed. Make sure you understand how and when to take each.   diclofenac 75 MG EC tablet Commonly known as:  VOLTAREN TAKE 1 TABLET(75 MG) BY MOUTH TWICE DAILY What changed:  Another medication with the same name was changed. Make sure you understand how and when to take each.   diclofenac 75 MG EC tablet Commonly known as:  VOLTAREN TAKE 1 TABLET(75 MG) BY MOUTH TWICE DAILY What changed:  See the new instructions.   escitalopram 20 MG tablet Commonly known as:  LEXAPRO Take 1 tablet (20 mg total) by mouth daily.   fluticasone 50 MCG/ACT nasal spray Commonly known as:  FLONASE Place 2 sprays into both nostrils daily.   gabapentin 100 MG capsule Commonly known as:  NEURONTIN TAKE 3 CAPSULES BY MOUTH THREE TIMES DAILY   glucose blood test strip Commonly known as:  ACCU-CHEK AVIVA PLUS 1 each by Other route 3 (three) times daily. ICD 10 E11.9   hydrochlorothiazide 12.5 MG tablet Commonly known as:  HYDRODIURIL Take 1 tablet (12.5 mg total) by mouth daily.   insulin aspart 100 UNIT/ML FlexPen Commonly known as:  NOVOLOG  FLEXPEN 15 units subcut with the two largest meals of the day What changed:    how much to take  how to take this  when to take this   Insulin Glargine 100 UNIT/ML Solostar Pen Commonly known as:  LANTUS SOLOSTAR 32 units at bedtime.  Increase by 2 units every 2-3 days until morning BS consistently 90-130.  Max of 40 units daily What changed:    how much to take  how to take this  when to take this  additional instructions   Insulin Pen Needle 31G X 8 MM Misc Commonly known as:  B-D ULTRAFINE III SHORT PEN 1 application by Does not apply route daily.   lisinopril 40 MG  tablet Commonly known as:  PRINIVIL,ZESTRIL TAKE 1 TABLET BY MOUTH DAILY   meloxicam 15 MG tablet Commonly known as:  MOBIC Take 15 mg by mouth daily.   morphine 15 MG tablet Commonly known as:  MSIR Take 15 mg by mouth 2 (two) times daily.   pantoprazole 40 MG tablet Commonly known as:  PROTONIX Take 1 tablet (40 mg total) by mouth daily.   sitaGLIPtin 50 MG tablet Commonly known as:  JANUVIA Take 1 tablet (50 mg total) by mouth daily.   sulfamethoxazole-trimethoprim 800-160 MG tablet Commonly known as:  BACTRIM DS,SEPTRA DS Take 1 tablet by mouth 2 (two) times daily.   SUMAtriptan 25 MG tablet Commonly known as:  IMITREX Take 1-2 tabs at start of headache. May repeat in 2 hrs if no relief. Max 4 tabs/24 hr.   tiZANidine 4 MG tablet Commonly known as:  ZANAFLEX Take 1 tablet (4 mg total) by mouth every 8 (eight) hours as needed for muscle spasms. What changed:    how much to take  when to take this  additional instructions       Disposition and follow-up:   BrianaBriana Williams was discharged from [facility] in Stable condition.  At the hospital follow up visit please address:  1.  Left Lower Extremity Cellulitis: X-ray negative for osteo. Symptoms improved with IV vanc, metronidazole and ceftriaxone. Sent home with bactrim and augmentin 10 days with schedule f/u with PCP, ortho per pt request, and podiatry. ABIs done were normal.  Uncontrolled Type II Diabetes: was not taking insulin consistently as she has been down since her mother passed away. On SSI and lantus in hospital. Consider switching to GLP-1 agonist such as victoza or ozempic to assist with weight loss if approval by insurance.   2.  Labs / imaging needed at time of follow-up: none  3.  Pending labs/ test needing follow-up: none  Follow-up Appointments:   Podiatry  Orthopedic Surgery  Ladell Pier, MD PCP   Hospital Course by problem list:   Discharge Vitals:    BP 103/63 (BP Location: Right Arm)   Pulse 67   Temp 98.3 F (36.8 C) (Oral)   Resp 18   Ht 4' 8.5" (1.435 m)   Wt 108.9 kg   SpO2 96%   BMI 52.86 kg/m   Pertinent Labs, Studies, and Procedures:  Briana Williams is a 37 year old female with hyperlipidemia, hypertension, poorly controlled diabetes who presented with left great toe cellulitis that extended to the knee with hyperglycemia. X-ray of the foot did not show osteomyelitis. She was initially treated with cetriaxone, vanc, and metronidazole and then transitioned to augmentin and bactrim prior to discharge. Wound care was consulted and wound was dressed and dressing provided prior to discharge. She was  provided strict return precautions and scheduled follow-up was done for podiatry, with her PCP and with Dr. Sharol Given per patient's request. In addition she had recently been non-compliant with her diabetes medications. Her lantus was adjusted, and she was discharged with education on the importance of glucose control.    Discharge Instructions: Discharge Instructions    Diet - low sodium heart healthy   Complete by:  As directed    Discharge instructions   Complete by:  As directed    You were hospitalized for cellulitis of the left great toe. Thank you for allowing [name] to be part of your care.   We arranged for you to follow up at:   [facility] Appointment time: December 27th at 1:50 PM  Dr. Newt Minion Orthopedic surgery [place], [place], [place] [address] [phone] Appointment: Thursday, December 19th at 9:30am  [facility]  Dr. Maren Beach Address: [address] [place], [place], [place] [address] Phone: [phone] Appointment time: 1:30 pm December 19th  Please call the offices to reschedule if these appointment times will not work for you.   Please also follow-up with your primary care physician at your earlier convenience.   Please note these changes made to your  medications:   Please START taking:   Clindamycin 300 mg one capsule every eight hours for 10 days, starting today  Augmentin (Amoxicillin-Clavulanate) 875 mg one tablet every 12 hours for 10 days, starting today  If you have worsening or increased redness, swelling, pain, or if you develop fever, vomiting, please return to the hospital as your infection could be resistant.   Increase activity slowly   Complete by:  As directed       Signed: Molli Hazard A, DO 07/15/2018, 9:12 AM   Pager: [phone]\

## 2018-07-13 NOTE — Progress Notes (Signed)
[name] to be D/C'd  per MD order. Discussed with the patient and all questions fully answered.  VSS, Skin clean, dry and intact without evidence of skin break down, no evidence of skin tears noted.  IV catheter discontinued intact. Site without signs and symptoms of complications. Dressing and pressure applied.  An After Visit Summary was printed and given to the patient. Patient received prescription.  D/c education completed with patient/family including follow up instructions, medication list, d/c activities limitations if indicated, with other d/c instructions as indicated by MD - patient able to verbalize understanding, all questions fully answered.   Patient instructed to return to ED, call 911, or call MD for any changes in condition.   Patient to be escorted via [place], and D/C home via private auto.

## 2018-07-13 NOTE — Progress Notes (Signed)
   Subjective:   Briana Williams reports of improvement with pain of the left toe. Nausea overnight has resolved. Denies fever or chills. Erythema has improved mildly as well. She is hopeful to go home today.   Objective:  Vital signs in last 24 hours: Vitals:   07/12/18 0522 07/12/18 1514 07/12/18 2200 07/13/18 0526  BP: (!) 102/57 (!) 107/59 (!) 150/80 103/63  Pulse: 70 70 81 67  Resp: 16 18 18 18   Temp: 97.8 F (36.6 C) 97.6 F (36.4 C) 98.8 F (37.1 C) 98.3 F (36.8 C)  TempSrc: Oral Oral Oral Oral  SpO2: 98% 99% 100% 96%  Weight:      Height:       Physical Exam Constitution: NAD, obese, well-developed Respiratory: non-labored breathing  MSK: moving all extremities, no edema  Neuro: a&o, cooperative Skin: mild erythema extending to mid thigh, LLE great toe wrapped in gauze without strike through  Assessment/Plan:  Principal Problem:   Diabetic foot infection ([place]) Active Problems:   Diabetes type 2, uncontrolled ([place])   Essential hypertension, benign   Toe infection   Morbid obesity ([place])  37yo female with PMH uncontrolled TIIDM who presented with left great toe ulcer with erythema extending to the knee.   Cellulitis of LLE: Xray negative for osteo. Received ceftriaxone, vanc, and metro. Cellulitis continues to improve but still has some erythema extending to mid-calf. Will switch to PO antibiotics today. Wound care consulted and placed aquacel with gauze and tape.   - start bactrim, augmentin - received one dose vanc, metro today, then d/c - d/c ceftriaxone - cont. Home morphine  - refer to Dr. Sharol Given per pt request - referral to podiatry   TIIDM Glucose improved with increased in medical therapy. Will schedule f/u with PCP for greater glucose control.   - cont. lantus 30U bid - novolog 8U tid qw - SSI   VTE: lovenox IVF: none Diet: carb modified Code: full   Dispo: Anticipated discharge today.   Molli Hazard A, DO 07/13/2018, 7:13 AM Pager:  [phone]

## 2018-07-14 ENCOUNTER — Ambulatory Visit: Payer: Medicaid Other | Admitting: Internal Medicine

## 2018-07-14 ENCOUNTER — Encounter: Payer: Self-pay | Admitting: Internal Medicine

## 2018-07-14 NOTE — Telephone Encounter (Signed)
Patient mychart concern.

## 2018-07-15 ENCOUNTER — Encounter (INDEPENDENT_AMBULATORY_CARE_PROVIDER_SITE_OTHER): Payer: Self-pay | Admitting: Orthopedic Surgery

## 2018-07-15 ENCOUNTER — Ambulatory Visit (INDEPENDENT_AMBULATORY_CARE_PROVIDER_SITE_OTHER): Payer: Medicaid Other | Admitting: Orthopedic Surgery

## 2018-07-15 VITALS — Ht <= 58 in | Wt 240.0 lb

## 2018-07-15 DIAGNOSIS — L089 Local infection of the skin and subcutaneous tissue, unspecified: Secondary | ICD-10-CM | POA: Diagnosis not present

## 2018-07-15 DIAGNOSIS — E11628 Type 2 diabetes mellitus with other skin complications: Secondary | ICD-10-CM | POA: Diagnosis not present

## 2018-07-15 MED ORDER — ONDANSETRON HCL 4 MG PO TABS: 4.0000 mg | ORAL_TABLET | Freq: Every day | ORAL | 0 refills | Status: DC | PRN

## 2018-07-15 NOTE — Progress Notes (Deleted)
Patient ID: Briana Williams, female   DOB: [birth year], 37 y.o.   MRN: [id number]  After hospitalization 12/14-12/16 for LE cellulitis.    From discharge summary: Disposition and follow-up:   Ms.Briana Williams was discharged from [facility] in Stable condition.  At the hospital follow up visit please address:  1.  Left Lower Extremity Cellulitis: X-ray negative for osteo. Symptoms improved with IV vanc, metronidazole and ceftriaxone. Sent home with bactrim and augmentin 10 days with schedule f/u with PCP, ortho per pt request, and podiatry. ABIs done were normal.  Uncontrolled Type II Diabetes: was not taking insulin consistently as she has been down since her mother passed away. On SSI and lantus in hospital. Consider switching to GLP-1 agonist such as victoza or ozempic to assist with weight loss if approval by insurance.

## 2018-07-15 NOTE — Progress Notes (Signed)
Office Visit Note   Patient: Briana Williams           Date of Birth: 02-02-1981           MRN: [id number] Visit Date: 07/15/2018              Requested by: Ladell Pier, MD [address] [place], [place] [address] PCP: Ladell Pier, MD  Chief Complaint  Patient presents with  . Left Foot - Pain    Big great toe, NP      HPI: The patient is a 37 year old woman seen today for evaluation of ulceration to her left great toe. States about 3 months ago she stubbed her toe and got a blood blister. Subsequently had an ulceration, had hospitalization this week for cellulitis to toe as well.   Has been dressing with silver cell and has silver gel she is using as well. Currently completing an oral abx course.  During admission had ABIs which were normal.   Assessment & Plan: Visit Diagnoses:  1. Diabetic foot infection ([place])     Plan: discussed proper wound care. May use silver gel to dorsum of toe. Cut silver cel to fit plantar toe ulcer. Minimize weight bearing until healed.  Follow-Up Instructions: Return in about 2 weeks (around 07/29/2018).   Ortho Exam  Patient is alert, oriented, no adenopathy, well-dressed, normal affect, normal respiratory effort. On examination has resolving cellulitis to left great toe. Trace edema. Has ulceration dorsally that is 3 mm in diameter with granulation tissue. No drainage. Plantar dip with 10 mm in diameter ulceration. This is filled in with granulation. No drainage. No sign of infection. Overall cellulitis and wound appear to be resolving.   Imaging: No results found. No images are attached to the encounter.  Labs: Lab Results  Component Value Date   HGBA1C 11.2 (A) 05/14/2018   HGBA1C 9.8 (H) 02/06/2018   HGBA1C 10.3 10/30/2017   ESRSEDRATE 44 (H) 07/11/2018   ESRSEDRATE 21 (H) 12/10/2016   CRP 2.7 (H) 07/11/2018   CRP 5.3 12/10/2016   REPTSTATUS 01/15/2011 FINAL 01/12/2011   CULT ESCHERICHIA COLI 01/12/2011   LABORGA ESCHERICHIA COLI 01/12/2011     Lab Results  Component Value Date   ALBUMIN 2.7 (L) 07/12/2018   ALBUMIN 3.6 07/11/2018   ALBUMIN 4.4 02/06/2018    Body mass index is 52.86 kg/m.  Orders:  No orders of the defined types were placed in this encounter.  No orders of the defined types were placed in this encounter.    Procedures: No procedures performed  Clinical Data: No additional findings.  ROS:  All other systems negative, except as noted in the HPI. Review of Systems  Objective: Vital Signs: Ht 4' 8.5" (1.435 m)   Wt 240 lb (108.9 kg)   BMI 52.86 kg/m   Specialty Comments:  No specialty comments available.  PMFS History: Patient Active Problem List   Diagnosis Date Noted  . Diabetic foot infection ([place]) 07/12/2018  . Morbid obesity ([place]) 07/12/2018  . Toe infection 07/11/2018  . Muscle spasm of back 04/24/2017  . Osteoarthritis of both hips 01/15/2017  . Recurrent headache 01/06/2017  . Irregular menstrual cycle 05/30/2016  . Chronic pain syndrome 09/21/2015  . HYPERTRIGLYCERIDEMIA 05/02/2009  . 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 Insomnia 05/01/2009  . SCOLIOSIS 05/18/2008  . Diabetes type 2, uncontrolled ([place]) 04/18/2008  . DEPRESSION 04/18/2008  . Essential hypertension, benign 04/18/2008   Past Medical History:  Diagnosis Date  .  Arthritis   . GERD (gastroesophageal reflux disease)   . Headache    migraine 2 weeks ago  . Hyperlipidemia   . Hypertension associated with diabetes ([place])   . Renal disorder    has both kidneys, compacted and 'squashed together"  . Scoliosis   . Sleep apnea    in the midst of being tested for it.   waiting on scheduling   . Type 2 diabetes mellitus treated with insulin ([facility])    dx 2000    Family History  Problem Relation Age of Onset  . Cancer Maternal Grandmother        mandibular cancer   . Diabetes Mother   . Heart failure Mother   . CAD Mother        triple CABG  . Diabetes Maternal Aunt   .  Stroke Father     Past Surgical History:  Procedure Laterality Date  . BACK SURGERY    . CERVICAL FUSION    . frontal closed sutures to forehead    . Harrington Rod placement    . HIP SURGERY     staph infection  . LUMBAR FUSION    . RADIOLOGY WITH ANESTHESIA N/A 02/25/2017   Procedure: RADIOLOGY WITH ANESTHESIA/MRI THORACIC SPINE AND LEFT SHOULDER WITHOUT CONTRAST.;  Surgeon: Radiologist, Medication, MD;  Location: [facility];  Service: Radiology;  Laterality: N/A;   Social History   Occupational History    Employer: UNEMPLOYED  Tobacco Use  . Smoking status: Never Smoker  . Smokeless tobacco: Never Used  Substance and Sexual Activity  . Alcohol use: No  . Drug use: No  . Sexual activity: Yes    Birth control/protection: Other-see comments    Comment: unable to have children d/t scoliosis

## 2018-07-16 ENCOUNTER — Ambulatory Visit: Payer: Medicaid Other | Admitting: Podiatry

## 2018-07-16 ENCOUNTER — Ambulatory Visit (INDEPENDENT_AMBULATORY_CARE_PROVIDER_SITE_OTHER): Payer: Medicaid Other | Admitting: Orthopedic Surgery

## 2018-07-19 ENCOUNTER — Encounter: Payer: Self-pay | Admitting: Internal Medicine

## 2018-07-24 ENCOUNTER — Inpatient Hospital Stay: Payer: Medicaid Other

## 2018-07-24 DIAGNOSIS — G894 Chronic pain syndrome: Secondary | ICD-10-CM | POA: Diagnosis not present

## 2018-07-24 DIAGNOSIS — M549 Dorsalgia, unspecified: Secondary | ICD-10-CM | POA: Diagnosis not present

## 2018-07-24 DIAGNOSIS — G8929 Other chronic pain: Secondary | ICD-10-CM | POA: Diagnosis not present

## 2018-07-24 DIAGNOSIS — Z79899 Other long term (current) drug therapy: Secondary | ICD-10-CM | POA: Diagnosis not present

## 2018-07-30 ENCOUNTER — Ambulatory Visit (INDEPENDENT_AMBULATORY_CARE_PROVIDER_SITE_OTHER): Payer: Medicaid Other | Admitting: Orthopedic Surgery

## 2018-08-05 IMAGING — MR MR PELVIS WO/W CM
4 of 9 series · 18 of 48 positions shown · IV contrast (20 MH)
Comparison: 01/14/2017 left hip MRI.  05/07/2017 pelvic sonogram.

CLINICAL DATA: Follow-up left adnexal mass incidentally noted on
left hip MRI. Nonvisualization of the ovaries on follow-up pelvic
sonogram.

EXAM:
MRI PELVIS WITHOUT AND WITH CONTRAST
TECHNIQUE: Multiplanar multisequence MR imaging of the pelvis was performed
both before and after administration of intravenous contrast.
CONTRAST:  20mL MULTIHANCE GADOBENATE DIMEGLUMINE 529 MG/ML IV SOLN

[Series 4: T2 · coronal · 5.0mm · 0.59mm/px · 6 of 37 slices shown (1 of 3)]
[im 1/37]
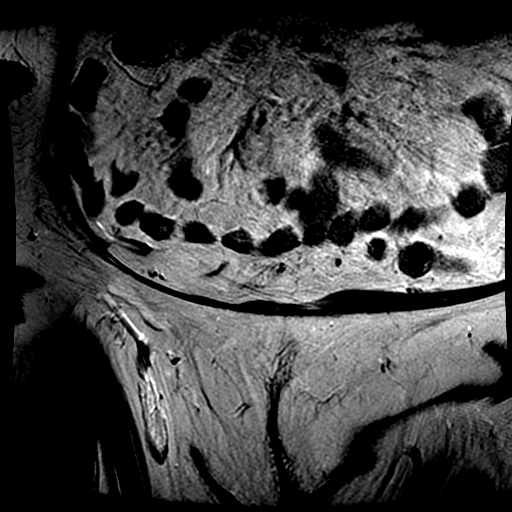
[im 8/37]
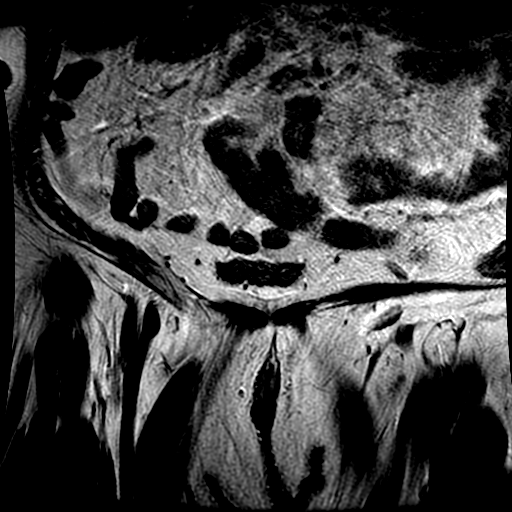
[im 15/37]
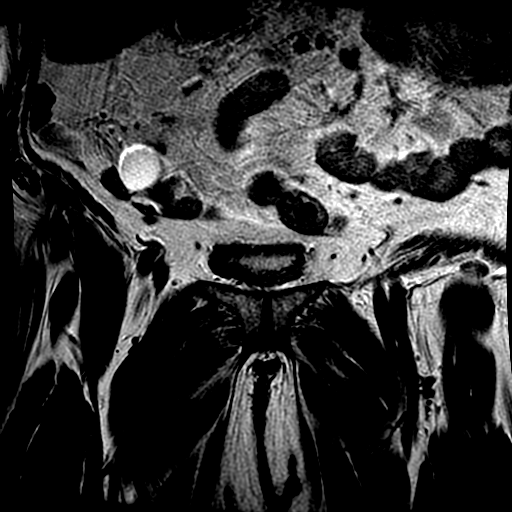
[im 22/37]
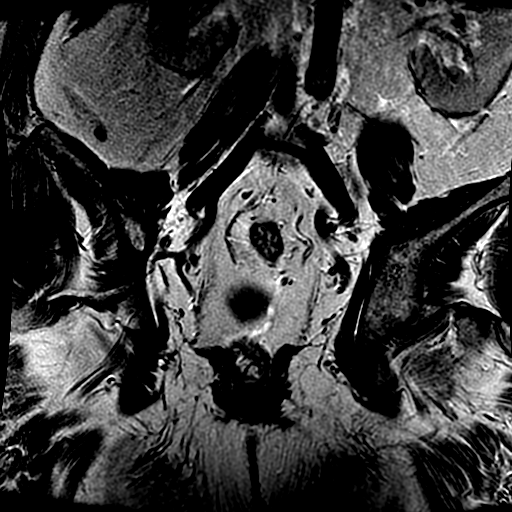
[im 29/37]
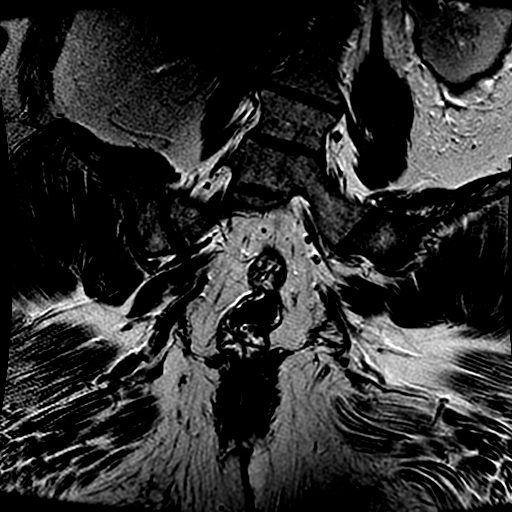
[im 37/37]
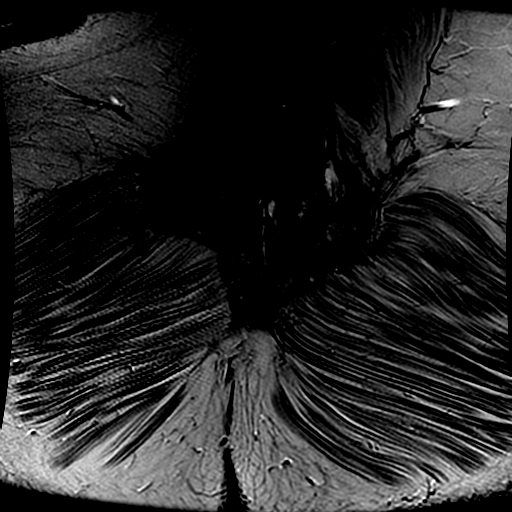

[Series 5: T2 · axial · 5.0mm · 0.51mm/px · z∈[+17,+232]mm · 6 of 37 slices shown (2 of 3)]
[im 1/37]
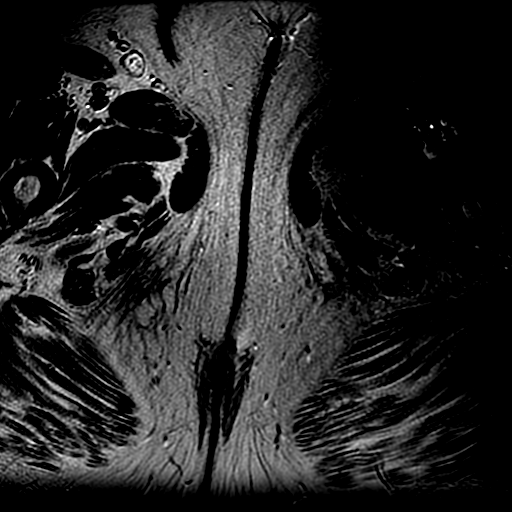
[im 8/37]
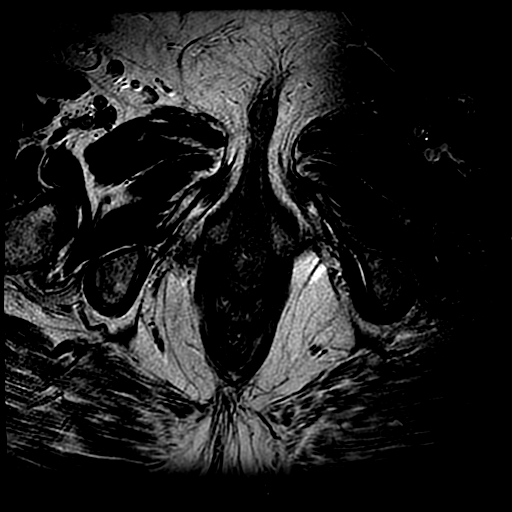
[im 15/37]
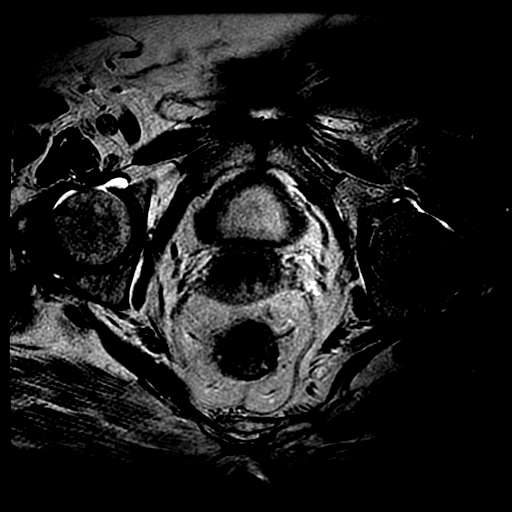
[im 22/37]
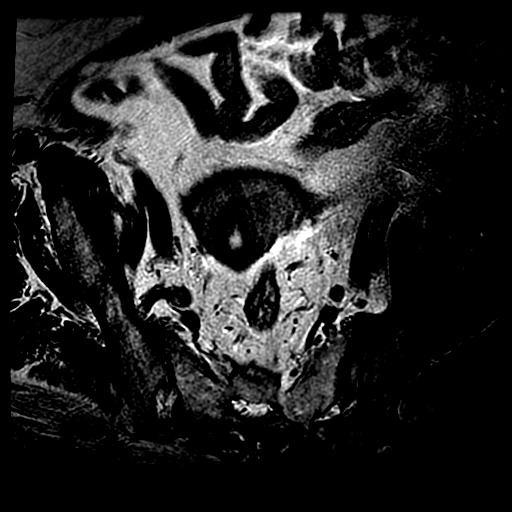
[im 29/37]
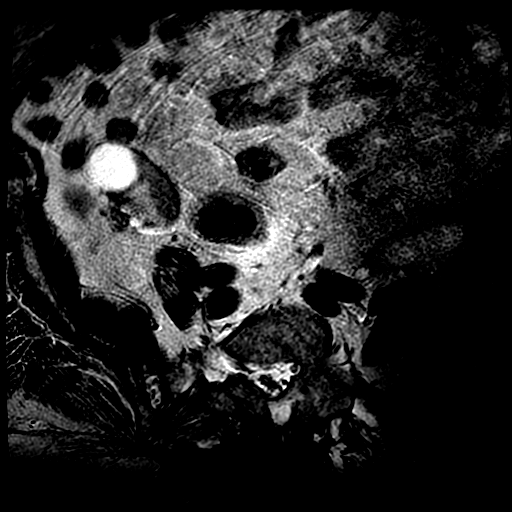
[im 37/37]
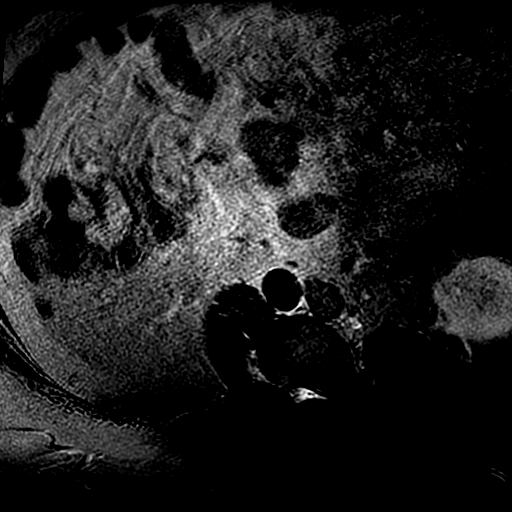

[Series 6: T2 fat-sat · axial · 5.0mm · 0.51mm/px · z∈[+59,+232]mm · 3 of 37 slices shown]
[im 8/37]
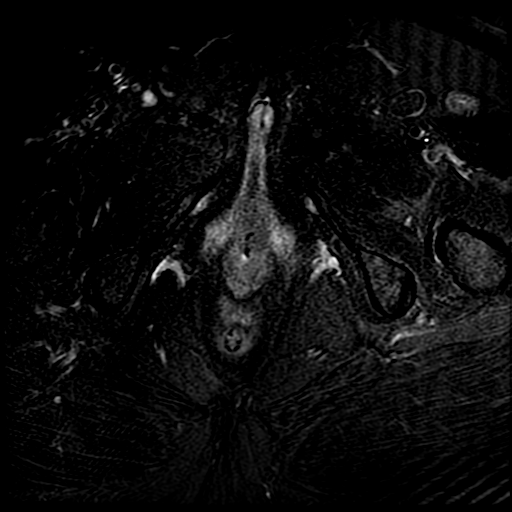
[im 22/37]
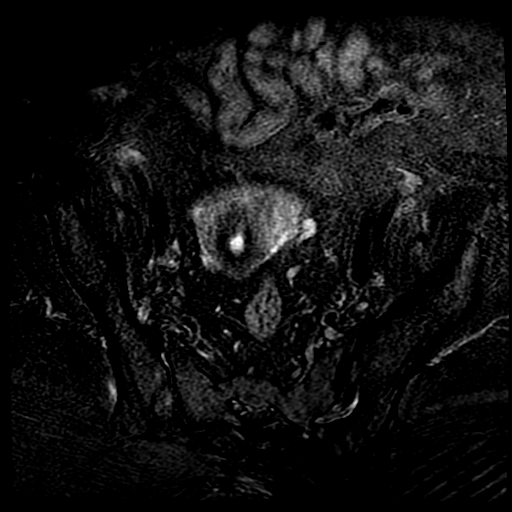
[im 37/37]
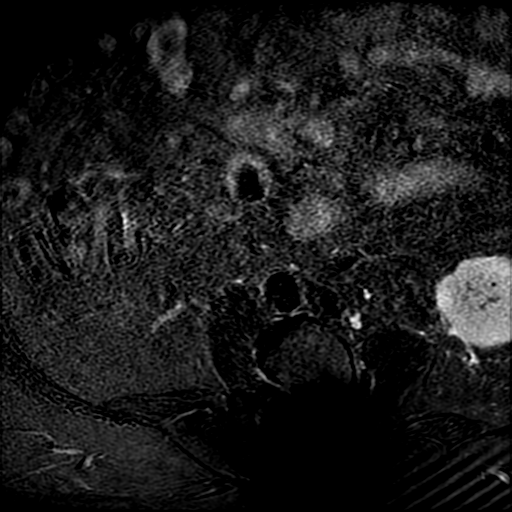

[Series 7: T2 · sagittal · 5.0mm · 0.51mm/px · 3 of 32 slices shown (3 of 3)]
[im 1/32]
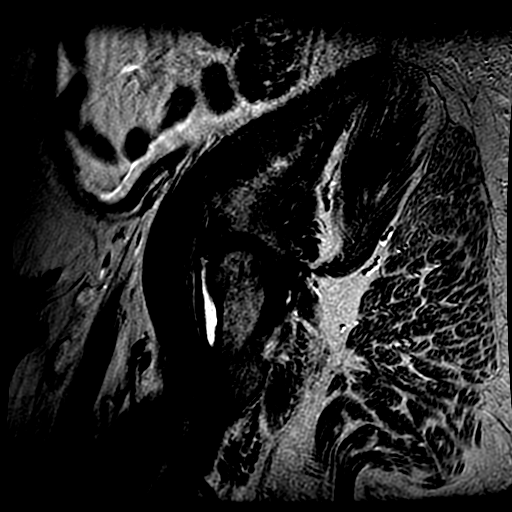
[im 16/32]
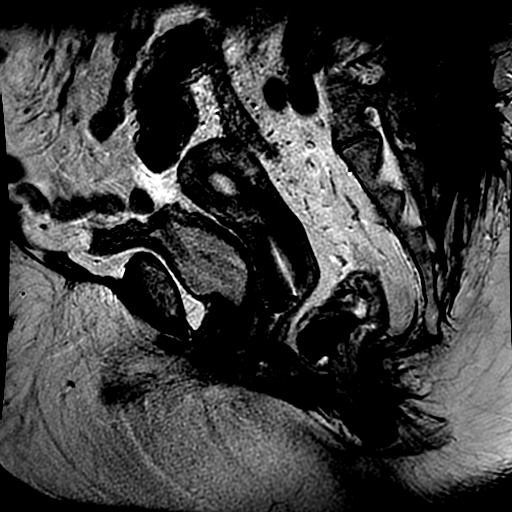
[im 32/32]
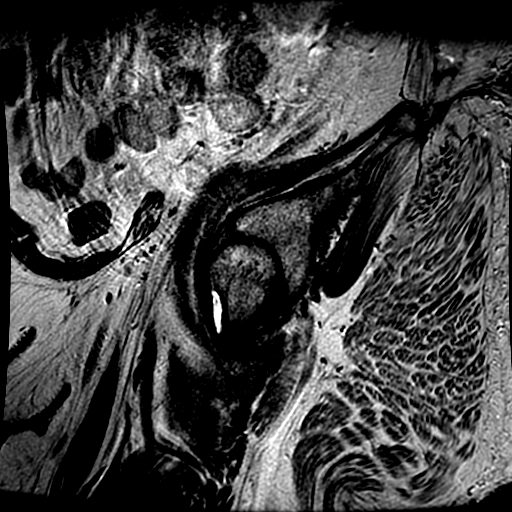

[18 of 48 positions shown; findings below may reference images not displayed]

FINDINGS: Urinary Tract:  Normal nondistended bladder.  Normal urethra.

Bowel: Visualized small and large bowel are normal caliber with no
bowel wall thickening.

Vascular/Lymphatic: No pathologically enlarged lymph nodes in the
pelvis. No acute vascular abnormality.

Reproductive:

Uterus: The normal size anteverted uterus measures 9.2 x 3.8 x
cm. No uterine fibroids. There is mild concavity of the fundal
endometrial contour with normal outer myometrial contour, compatible
with arcuate configuration of the uterus. Inner myometrium
(junctional zone) measures 5 mm in thickness, which is within normal
limits. Endometrium measures 9 mm in bilayer thickness, which is
within normal limits. No endometrial cavity fluid or focal
endometrial mass.

Ovaries and Adnexa: The right ovary measures 3.4 x 2.8 x 4.5 cm and
contains a 2.9 x 2.6 x 2.5 cm dominant follicular cyst (series
12/image 3). The left ovary measures 3.4 x 2.7 x 3.7 cm and is
normal. The previously described 3.2 cm T2 hyperintense left ovarian
mass on the 01/14/2017 hip MRI study is absent on today's scan,
compatible with resolved physiologic left ovarian cyst. There are no
suspicious ovarian or adnexal masses.

Other: No abnormal free fluid in the pelvis. No focal pelvic fluid
collection.

Musculoskeletal: No aggressive appearing focal osseous lesions.
Postsurgical changes in the posterior lower lumbar spine are not
well evaluated on this scan.
IMPRESSION: 1. No suspicious ovarian or adnexal masses. Previously described
left adnexal mass on 01/14/2017 hip MRI study is absent on today's
scan, compatible with resolved physiologic left ovarian cyst. New
dominant 2.9 cm follicular cyst in the right ovary on today's scan.
2. Arcuate configuration of the uterus.
3. Otherwise normal pelvic MRI.

## 2018-08-19 ENCOUNTER — Other Ambulatory Visit: Payer: Self-pay | Admitting: Internal Medicine

## 2018-08-19 DIAGNOSIS — I10 Essential (primary) hypertension: Secondary | ICD-10-CM

## 2018-08-20 ENCOUNTER — Other Ambulatory Visit: Payer: Self-pay | Admitting: Internal Medicine

## 2018-08-20 DIAGNOSIS — I10 Essential (primary) hypertension: Secondary | ICD-10-CM

## 2018-08-26 DIAGNOSIS — Z79899 Other long term (current) drug therapy: Secondary | ICD-10-CM | POA: Diagnosis not present

## 2018-08-31 ENCOUNTER — Ambulatory Visit (INDEPENDENT_AMBULATORY_CARE_PROVIDER_SITE_OTHER): Payer: Medicaid Other

## 2018-08-31 ENCOUNTER — Encounter (INDEPENDENT_AMBULATORY_CARE_PROVIDER_SITE_OTHER): Payer: Self-pay | Admitting: Specialist

## 2018-08-31 ENCOUNTER — Ambulatory Visit (INDEPENDENT_AMBULATORY_CARE_PROVIDER_SITE_OTHER): Payer: Medicaid Other | Admitting: Specialist

## 2018-08-31 VITALS — BP 149/81 | HR 69 | Ht <= 58 in | Wt 240.0 lb

## 2018-08-31 DIAGNOSIS — G8929 Other chronic pain: Secondary | ICD-10-CM

## 2018-08-31 DIAGNOSIS — M25552 Pain in left hip: Secondary | ICD-10-CM | POA: Diagnosis not present

## 2018-08-31 DIAGNOSIS — M545 Low back pain: Secondary | ICD-10-CM | POA: Diagnosis not present

## 2018-08-31 NOTE — Progress Notes (Signed)
Office Visit Note   Patient: Briana Williams           Date of Birth: [birth year]           MRN: [id number] Visit Date: 08/31/2018              Requested by: Ladell Pier, MD [address] [place], [place] [address] PCP: Ladell Pier, MD   Assessment & Plan: Visit Diagnoses:  1. Chronic left-sided low back pain, unspecified whether sciatica present   2. Pain in left hip     Plan: Avoid frequent bending and stooping  No lifting greater than 10 lbs. May use ice or moist heat for pain. Weight loss is of benefit. Best medication for lumbar disc disease is arthritis medications like motrin, celebrex and naprosyn. Exercise is important to improve your indurance and does allow people to function better inspite of back pain.  Total body bone scan to assess for an area of bone uptake that may coorelate with cause of severe mid and low back pain.   Follow-Up Instructions: No follow-ups on file.   Orders:  Orders Placed This Encounter  Procedures  . XR HIPS BILAT W OR W/O PELVIS 3-4 VIEWS  . XR Lumbar Spine 2-3 Views   No orders of the defined types were placed in this encounter.     Procedures: No procedures performed   Clinical Data: No additional findings.   Subjective: Chief Complaint  Patient presents with  . Lower Back - Pain  . Left Hip - Pain    38 year old female with kyphoscoliosis of the thoracolumbar spine. Seen last about 18 months ago 01/2017. She feels like things are getting worse with 2 falls to the floor and while at an aunt's funeral while walking across a grassy area she fell again and landing on her face in [place]. She was helping to take care of her mother, has had a localized infection of the left great toe. She saw Dr. Sharol Given. She had a blister go on to get infected. She is on insulin for diabetes, Type II. She is running sugars in the 160s or lower. She does uring tests one time per month. Pain along the left scapula extending into  the left lower back and radiates downwards into the left leg.    Review of Systems  Constitutional: Negative.   HENT: Positive for congestion.   Eyes: Negative.   Respiratory: Positive for cough. Negative for shortness of breath, wheezing and stridor.   Cardiovascular: Negative.  Negative for chest pain, palpitations and leg swelling.  Gastrointestinal: Negative.  Negative for abdominal distention, abdominal pain, anal bleeding and constipation.  Endocrine: Positive for cold intolerance. Negative for heat intolerance and polydipsia.  Genitourinary: Negative.  Negative for difficulty urinating, dyspareunia, dysuria, enuresis, flank pain and hematuria.  Musculoskeletal: Positive for back pain. Negative for arthralgias, gait problem, joint swelling, myalgias, neck pain and neck stiffness.  Skin: Negative.   Allergic/Immunologic: Negative.   Neurological: Negative.   Hematological: Negative.   Psychiatric/Behavioral: Negative.      Objective: Vital Signs: There were no vitals taken for this visit.  Physical Exam  Ortho Exam  Specialty Comments:  No specialty comments available.  Imaging: No results found.   PMFS History: Patient Active Problem List   Diagnosis Date Noted  . Diabetic foot infection ([place]) 07/12/2018  . Morbid obesity ([place]) 07/12/2018  . Toe infection 07/11/2018  . Muscle spasm of back 04/24/2017  . Osteoarthritis of  both hips 01/15/2017  . Recurrent headache 01/06/2017  . Irregular menstrual cycle 05/30/2016  . Chronic pain syndrome 09/21/2015  . HYPERTRIGLYCERIDEMIA 05/02/2009  . 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 Insomnia 05/01/2009  . SCOLIOSIS 05/18/2008  . Diabetes type 2, uncontrolled ([place]) 04/18/2008  . DEPRESSION 04/18/2008  . Essential hypertension, benign 04/18/2008   Past Medical History:  Diagnosis Date  . Arthritis   . GERD (gastroesophageal reflux disease)   . Headache    migraine 2 weeks ago  . Hyperlipidemia   . Hypertension  associated with diabetes ([place])   . Renal disorder    has both kidneys, compacted and 'squashed together"  . Scoliosis   . Sleep apnea    in the midst of being tested for it.   waiting on scheduling   . Type 2 diabetes mellitus treated with insulin ([facility])    dx 2000    Family History  Problem Relation Age of Onset  . Cancer Maternal Grandmother        mandibular cancer   . Diabetes Mother   . Heart failure Mother   . CAD Mother        triple CABG  . Diabetes Maternal Aunt   . Stroke Father     Past Surgical History:  Procedure Laterality Date  . BACK SURGERY    . CERVICAL FUSION    . frontal closed sutures to forehead    . Harrington Rod placement    . HIP SURGERY     staph infection  . LUMBAR FUSION    . RADIOLOGY WITH ANESTHESIA N/A 02/25/2017   Procedure: RADIOLOGY WITH ANESTHESIA/MRI THORACIC SPINE AND LEFT SHOULDER WITHOUT CONTRAST.;  Surgeon: Radiologist, Medication, MD;  Location: [facility];  Service: Radiology;  Laterality: N/A;   Social History   Occupational History    Employer: UNEMPLOYED  Tobacco Use  . Smoking status: Never Smoker  . Smokeless tobacco: Never Used  Substance and Sexual Activity  . Alcohol use: No  . Drug use: No  . Sexual activity: Yes    Birth control/protection: Other-see comments    Comment: unable to have children d/t scoliosis

## 2018-08-31 NOTE — Patient Instructions (Addendum)
Avoid frequent bending and stooping  No lifting greater than 10 lbs. May use ice or moist heat for pain. Weight loss is of benefit. Best medication for lumbar disc disease is arthritis medications like motrin, celebrex and naprosyn. Exercise is important to improve your indurance and does allow people to function better inspite of back pain.  Total body bone scan to assess for an area of bone uptake that may coorelate with cause of severe mid and low back pain.

## 2018-09-01 ENCOUNTER — Other Ambulatory Visit: Payer: Self-pay | Admitting: Internal Medicine

## 2018-09-09 ENCOUNTER — Encounter: Payer: Self-pay | Admitting: Internal Medicine

## 2018-09-11 ENCOUNTER — Encounter (INDEPENDENT_AMBULATORY_CARE_PROVIDER_SITE_OTHER): Payer: Self-pay | Admitting: Specialist

## 2018-09-11 ENCOUNTER — Telehealth (INDEPENDENT_AMBULATORY_CARE_PROVIDER_SITE_OTHER): Payer: Self-pay | Admitting: Specialist

## 2018-09-11 NOTE — Telephone Encounter (Signed)
Currently unable to make CD's IT is working on this.  Will call patient when ready.

## 2018-09-11 NOTE — Telephone Encounter (Signed)
Pt came in to pick up CD images  Her phone # [phone]

## 2018-09-11 NOTE — Telephone Encounter (Signed)
CD made left message on voicemail.

## 2018-09-15 ENCOUNTER — Other Ambulatory Visit: Payer: Self-pay | Admitting: Internal Medicine

## 2018-09-15 ENCOUNTER — Encounter: Payer: Self-pay | Admitting: Internal Medicine

## 2018-09-15 ENCOUNTER — Ambulatory Visit: Payer: Medicaid Other | Attending: Internal Medicine | Admitting: Internal Medicine

## 2018-09-15 VITALS — BP 181/96 | HR 89 | Temp 98.2°F | Resp 16 | Wt 239.0 lb

## 2018-09-15 DIAGNOSIS — Z8249 Family history of ischemic heart disease and other diseases of the circulatory system: Secondary | ICD-10-CM | POA: Diagnosis not present

## 2018-09-15 DIAGNOSIS — I10 Essential (primary) hypertension: Secondary | ICD-10-CM | POA: Diagnosis not present

## 2018-09-15 DIAGNOSIS — M160 Bilateral primary osteoarthritis of hip: Secondary | ICD-10-CM | POA: Insufficient documentation

## 2018-09-15 DIAGNOSIS — Z6841 Body Mass Index (BMI) 40.0 and over, adult: Secondary | ICD-10-CM | POA: Diagnosis not present

## 2018-09-15 DIAGNOSIS — Z885 Allergy status to narcotic agent status: Secondary | ICD-10-CM | POA: Diagnosis not present

## 2018-09-15 DIAGNOSIS — E1165 Type 2 diabetes mellitus with hyperglycemia: Secondary | ICD-10-CM | POA: Insufficient documentation

## 2018-09-15 DIAGNOSIS — Z808 Family history of malignant neoplasm of other organs or systems: Secondary | ICD-10-CM | POA: Insufficient documentation

## 2018-09-15 DIAGNOSIS — F419 Anxiety disorder, unspecified: Secondary | ICD-10-CM | POA: Diagnosis not present

## 2018-09-15 DIAGNOSIS — Z888 Allergy status to other drugs, medicaments and biological substances status: Secondary | ICD-10-CM | POA: Diagnosis not present

## 2018-09-15 DIAGNOSIS — Z884 Allergy status to anesthetic agent status: Secondary | ICD-10-CM | POA: Insufficient documentation

## 2018-09-15 DIAGNOSIS — F329 Major depressive disorder, single episode, unspecified: Secondary | ICD-10-CM | POA: Diagnosis not present

## 2018-09-15 DIAGNOSIS — E11621 Type 2 diabetes mellitus with foot ulcer: Secondary | ICD-10-CM | POA: Diagnosis not present

## 2018-09-15 DIAGNOSIS — Z792 Long term (current) use of antibiotics: Secondary | ICD-10-CM | POA: Diagnosis not present

## 2018-09-15 DIAGNOSIS — G894 Chronic pain syndrome: Secondary | ICD-10-CM | POA: Insufficient documentation

## 2018-09-15 DIAGNOSIS — L97522 Non-pressure chronic ulcer of other part of left foot with fat layer exposed: Secondary | ICD-10-CM

## 2018-09-15 DIAGNOSIS — Z794 Long term (current) use of insulin: Secondary | ICD-10-CM | POA: Diagnosis not present

## 2018-09-15 DIAGNOSIS — Z79899 Other long term (current) drug therapy: Secondary | ICD-10-CM | POA: Insufficient documentation

## 2018-09-15 DIAGNOSIS — Z791 Long term (current) use of non-steroidal anti-inflammatories (NSAID): Secondary | ICD-10-CM | POA: Diagnosis not present

## 2018-09-15 DIAGNOSIS — Z882 Allergy status to sulfonamides status: Secondary | ICD-10-CM | POA: Insufficient documentation

## 2018-09-15 DIAGNOSIS — IMO0001 Reserved for inherently not codable concepts without codable children: Secondary | ICD-10-CM

## 2018-09-15 DIAGNOSIS — F32A Depression, unspecified: Secondary | ICD-10-CM

## 2018-09-15 DIAGNOSIS — Z833 Family history of diabetes mellitus: Secondary | ICD-10-CM | POA: Insufficient documentation

## 2018-09-15 DIAGNOSIS — R600 Localized edema: Secondary | ICD-10-CM | POA: Diagnosis not present

## 2018-09-15 DIAGNOSIS — G4700 Insomnia, unspecified: Secondary | ICD-10-CM | POA: Insufficient documentation

## 2018-09-15 DIAGNOSIS — M62838 Other muscle spasm: Secondary | ICD-10-CM

## 2018-09-15 DIAGNOSIS — L309 Dermatitis, unspecified: Secondary | ICD-10-CM | POA: Diagnosis not present

## 2018-09-15 LAB — POCT GLYCOSYLATED HEMOGLOBIN (HGB A1C): HbA1c, POC (controlled diabetic range): 10.3 % — AB (ref 0.0–7.0)

## 2018-09-15 LAB — GLUCOSE, POCT (MANUAL RESULT ENTRY): POC Glucose: 228 mg/dl — AB (ref 70–99)

## 2018-09-15 MED ORDER — INSULIN ASPART 100 UNIT/ML FLEXPEN: 24.0000 [IU] | PEN_INJECTOR | Freq: Two times a day (BID) | SUBCUTANEOUS | 3 refills | Status: DC

## 2018-09-15 MED ORDER — SITAGLIPTIN PHOSPHATE 50 MG PO TABS: 50.0000 mg | ORAL_TABLET | Freq: Every day | ORAL | 11 refills | Status: DC

## 2018-09-15 MED ORDER — CIPROFLOXACIN HCL 500 MG PO TABS: 500.0000 mg | ORAL_TABLET | Freq: Two times a day (BID) | ORAL | 0 refills | Status: DC

## 2018-09-15 MED ORDER — HYDROCHLOROTHIAZIDE 25 MG PO TABS: 25.0000 mg | ORAL_TABLET | Freq: Every day | ORAL | 2 refills | Status: DC

## 2018-09-15 MED ORDER — INSULIN GLARGINE 100 UNIT/ML SOLOSTAR PEN: 38.0000 [IU] | PEN_INJECTOR | Freq: Every day | SUBCUTANEOUS | 11 refills | Status: DC

## 2018-09-15 MED ORDER — CARVEDILOL 3.125 MG PO TABS: 3.1250 mg | ORAL_TABLET | Freq: Two times a day (BID) | ORAL | 6 refills | Status: DC

## 2018-09-15 MED ORDER — ALPRAZOLAM 1 MG PO TABS: ORAL_TABLET | ORAL | 0 refills | Status: DC

## 2018-09-15 MED ORDER — AMOXICILLIN-POT CLAVULANATE 500-125 MG PO TABS: 1.0000 | ORAL_TABLET | Freq: Two times a day (BID) | ORAL | 0 refills | Status: DC

## 2018-09-15 MED ORDER — LISINOPRIL 40 MG PO TABS: 40.0000 mg | ORAL_TABLET | Freq: Every day | ORAL | 6 refills | Status: DC

## 2018-09-15 MED ORDER — ESCITALOPRAM OXALATE 20 MG PO TABS: 20.0000 mg | ORAL_TABLET | Freq: Every day | ORAL | 2 refills | Status: DC

## 2018-09-15 MED ORDER — ATORVASTATIN CALCIUM 10 MG PO TABS: 10.0000 mg | ORAL_TABLET | Freq: Every day | ORAL | 3 refills | Status: DC

## 2018-09-15 NOTE — Progress Notes (Signed)
Patient ID: Briana Williams, female    DOB: August 03, 1980  MRN: [id number]  CC: Diabetes and Hypertension   Subjective: Briana Williams is a 38 y.o. female who presents for chronic ds management.  Husband and female friend are with her Her concerns today include:  Hx of scoliosis(multiple back surgeries), HTN, insomnia, DM2, depression, migraines. OA hips seen on MRI,  Patient currently being followed by [facility] pain clinic by provider Jeanella Anton.  She is currently on Belbuca and morphine sulfate.  She has seen orthopedic Dr. Louanne Skye the early part of this month and reports being told that she has has 3-4 masses on the spine seen on plain x-ray.  He has ordered a bone scan for further evaluation.  This is scheduled for tomorrow.  He is requesting med to help her relax during the scan as she gets clusterphobic.   Sent me message last wk via Mychart reporting intractable vomiting.  She reports that she had N/V x 2 wks.  No associated diarrhea or abdominal pain.  Did have soreness in the abdomen from recurrent vomiting.  No other family members in the household was sick like this.  She does not related to any particular types of foods.  She is not pregnant stating that she has not been active.  The nausea and vomiting resolved 3 days ago.   DM:  Checks BS occasionally.  Would like Libre as she does not like sticking herself and fingers get sore from doing so -on Novolog 24 BID, Lantus 32 daily, Byetta Taking meds consistently since hosp in 06/2018 Eating habits: could be better. Burgers,pork chops, white carbs -developed blister on LT big toe around 04/2018 after accidentally kicking table.  Turned into an ulcer.  Hosp 06/2018 for cellulitis of left great toe that extended to the knee.  She was treated with antibiotics.  Doppler ultrasound of the leg was negative for DVT.  X-ray of the foot did not reveal any signs of osteomyelitis.  She was seen by wound care during that hospitalization  and follow-up with a podiatrist was advised but patient requested to be seen by Dr. Sharol Given instead.  She saw him in December and he recommended using silver gel dressing and to minimize weightbearing until the area is healed. -Patient reports that she has been doing dressing changes daily and it has significantly improve compared to December.  She has pictures on her cell phone that she shows me.  She denies any drainage from the area but states that it bleeds at times.  HTN: Compliant with meds Limits salt in foods Swelling in LT leg x several mths.  Has had two doppler US - one done buy her pain clinic provider and the other when she was in [place].   No CP/SOB  Requests RF on all meds Patient Active Problem List   Diagnosis Date Noted  . Diabetic foot infection ([place]) 07/12/2018  . Morbid obesity ([place]) 07/12/2018  . Toe infection 07/11/2018  . Muscle spasm of back 04/24/2017  . Osteoarthritis of both hips 01/15/2017  . Recurrent headache 01/06/2017  . Irregular menstrual cycle 05/30/2016  . Chronic pain syndrome 09/21/2015  . HYPERTRIGLYCERIDEMIA 05/02/2009  . 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 Insomnia 05/01/2009  . SCOLIOSIS 05/18/2008  . Diabetes type 2, uncontrolled ([place]) 04/18/2008  . DEPRESSION 04/18/2008  . Essential hypertension, benign 04/18/2008     Current Outpatient Medications on File Prior to Visit  Medication Sig Dispense Refill  . ACCU-CHEK SOFTCLIX LANCETS lancets 1 each  by Other route 3 (three) times daily. ICD 10 E11.9 100 each 12  . albuterol (PROVENTIL HFA;VENTOLIN HFA) 108 (90 Base) MCG/ACT inhaler Inhale 1 puff into the lungs every 6 (six) hours as needed for wheezing or shortness of breath. 8 g 3  . amitriptyline (ELAVIL) 25 MG tablet Take 1 tablet (25 mg total) by mouth at bedtime. 30 tablet 6  . Blood Glucose Monitoring Suppl (ACCU-CHEK AVIVA PLUS) w/Device KIT 1 Device by Does not apply route 3 (three) times daily after meals. ICD 10 E11.9 1 kit 0  .  Buprenorphine HCl (BELBUCA) 300 MCG FILM Place 300 mcg inside cheek 2 (two) times daily.    Marland Kitchen BYETTA 5 MCG PEN 5 MCG/0.02ML SOPN injection ADMINISTER 5 MCG UNDER THE SKIN TWICE DAILY WITH A MEAL (Patient taking differently: Inject 5 mcg into the skin 2 (two) times daily with a meal. ) 1.2 mL 0  . diclofenac (VOLTAREN) 75 MG EC tablet TAKE 1 TABLET(75 MG) BY MOUTH TWICE DAILY 60 tablet 0  . diclofenac (VOLTAREN) 75 MG EC tablet TAKE 1 TABLET(75 MG) BY MOUTH TWICE DAILY (Patient taking differently: Take 75 mg by mouth 2 (two) times daily. ) 180 tablet 0  . fluticasone (FLONASE) 50 MCG/ACT nasal spray Place 2 sprays into both nostrils daily. 16 g 6  . gabapentin (NEURONTIN) 100 MG capsule TAKE 3 CAPSULES BY MOUTH THREE TIMES DAILY (Patient taking differently: Take 300 mg by mouth 3 (three) times daily. ) 270 capsule 5  . glucose blood (ACCU-CHEK AVIVA PLUS) test strip 1 each by Other route 3 (three) times daily. ICD 10 E11.9 100 each 12  . Insulin Pen Needle (B-D ULTRAFINE III SHORT PEN) 31G X 8 MM MISC 1 application by Does not apply route daily. 60 each 5  . Lancet Devices (ACCU-CHEK SOFTCLIX) lancets 1 each by Other route 3 (three) times daily. ICD 10 E11.9 1 each 0  . meloxicam (MOBIC) 15 MG tablet Take 15 mg by mouth daily.    Marland Kitchen morphine (MSIR) 15 MG tablet Take 15 mg by mouth 2 (two) times daily.    . ondansetron (ZOFRAN) 4 MG tablet Take 1 tablet (4 mg total) by mouth daily as needed for nausea or vomiting. 15 tablet 0  . pantoprazole (PROTONIX) 40 MG tablet Take 1 tablet (40 mg total) by mouth daily. 30 tablet 0  . SUMAtriptan (IMITREX) 25 MG tablet Take 1-2 tabs at start of headache. May repeat in 2 hrs if no relief. Max 4 tabs/24 hr. 10 tablet 1  . tiZANidine (ZANAFLEX) 4 MG tablet Take 1 tablet (4 mg total) by mouth every 8 (eight) hours as needed for muscle spasms. (Patient taking differently: Take 2-4 mg by mouth 4 (four) times daily. 1/2 tablet (2 mg) if going out, 1 tablet (4 mg) if at  home) 90 tablet 4   No current facility-administered medications on file prior to visit.     Allergies  Allergen Reactions  . Hydrocodone Itching    Note: benadryl does NOT help  . Metformin And Related Other (See Comments)    Causes blisters on skin  . Norvasc [Amlodipine Besylate] Swelling    Ankle swelling  . Ultrasound Gel Rash and Other (See Comments)    Patient gets a red rash with use of ultrasound gel/ burns skin    Social History   Socioeconomic History  . Marital status: Married    Spouse name: Not on file  . Number of children: 3  . Years  of education: Not on file  . Highest education level: Not on file  Occupational History    Employer: UNEMPLOYED  Social Needs  . Financial resource strain: Not on file  . Food insecurity:    Worry: Not on file    Inability: Not on file  . Transportation needs:    Medical: Not on file    Non-medical: Not on file  Tobacco Use  . Smoking status: Never Smoker  . Smokeless tobacco: Never Used  Substance and Sexual Activity  . Alcohol use: No  . Drug use: No  . Sexual activity: Yes    Birth control/protection: Other-see comments    Comment: unable to have children d/t scoliosis  Lifestyle  . Physical activity:    Days per week: Not on file    Minutes per session: Not on file  . Stress: Not on file  Relationships  . Social connections:    Talks on phone: Not on file    Gets together: Not on file    Attends religious service: Not on file    Active member of club or organization: Not on file    Attends meetings of clubs or organizations: Not on file    Relationship status: Not on file  . Intimate partner violence:    Fear of current or ex partner: Not on file    Emotionally abused: Not on file    Physically abused: Not on file    Forced sexual activity: Not on file  Other Topics Concern  . Not on file  Social History Narrative  . Not on file    Family History  Problem Relation Age of Onset  . Cancer Maternal  Grandmother        mandibular cancer   . Diabetes Mother   . Heart failure Mother   . CAD Mother        triple CABG  . Diabetes Maternal Aunt   . Stroke Father     Past Surgical History:  Procedure Laterality Date  . BACK SURGERY    . CERVICAL FUSION    . frontal closed sutures to forehead    . Harrington Rod placement    . HIP SURGERY     staph infection  . LUMBAR FUSION    . RADIOLOGY WITH ANESTHESIA N/A 02/25/2017   Procedure: RADIOLOGY WITH ANESTHESIA/MRI THORACIC SPINE AND LEFT SHOULDER WITHOUT CONTRAST.;  Surgeon: Radiologist, Medication, MD;  Location: [facility];  Service: Radiology;  Laterality: N/A;    ROS: Review of Systems  Skin:       C/o itchy red spots on legs LT >RT x several wks.  No initiating factors.   Negative except as stated above  PHYSICAL EXAM: BP (!) 181/96   Pulse 89   Temp 98.2 F (36.8 C) (Oral)   Resp 16   Wt 239 lb (108.4 kg)   SpO2 98%   BMI 52.64 kg/m   Wt Readings from Last 3 Encounters:  09/15/18 239 lb (108.4 kg)  08/31/18 240 lb (108.9 kg)  07/15/18 240 lb (108.9 kg)   Repeat BP 150/90 Physical Exam  General appearance - pt in NAD Mental status - normal mood, behavior, speech, dress, motor activity, and thought processes Mouth - mucous membranes moist, pharynx normal without lesions Neck - no LN or thyroid enlargement Chest - clear to auscultation, no wheezes, rales or rhonchi, symmetric air entry Heart - RRR, soft SEM 1/6 LLSB Extremities -BL E edema, LT>>RT Skin - please see pics below  of cracked ulcer on dorsum and later aspect of LT big toe. Slight odor.  No fluctuance or tenderness. Wet b/w cracked fissures Few scattered red, macular spots on LT leg Diabetic Foot Exam - Simple   Simple Foot Form Visual Inspection See comments:  Yes Sensation Testing See comments:  Yes Pulse Check Posterior Tibialis and Dorsalis pulse intact bilaterally:  Yes Comments Feet warm, DP/PT pulses 3+ BL.  Decrease sensation on plantar  surface LT foot on LEAP.  See pics of ulcer on LT big toe           Results for orders placed or performed in visit on 09/15/18  Sedimentation Rate  Result Value Ref Range   Sed Rate 73 (H) 0 - 32 mm/hr  CBC with Differential/Platelet  Result Value Ref Range   WBC 7.4 3.4 - 10.8 x10E3/uL   RBC 4.06 3.77 - 5.28 x10E6/uL   Hemoglobin 11.2 11.1 - 15.9 g/dL   Hematocrit 33.8 (L) 34.0 - 46.6 %   MCV 83 79 - 97 fL   MCH 27.6 26.6 - 33.0 pg   MCHC 33.1 31.5 - 35.7 g/dL   RDW 13.3 11.7 - 15.4 %   Platelets 278 150 - 450 x10E3/uL   Neutrophils 56 Not Estab. %   Lymphs 34 Not Estab. %   Monocytes 7 Not Estab. %   Eos 2 Not Estab. %   Basos 1 Not Estab. %   Neutrophils Absolute 4.2 1.4 - 7.0 x10E3/uL   Lymphocytes Absolute 2.5 0.7 - 3.1 x10E3/uL   Monocytes Absolute 0.5 0.1 - 0.9 x10E3/uL   EOS (ABSOLUTE) 0.2 0.0 - 0.4 x10E3/uL   Basophils Absolute 0.0 0.0 - 0.2 x10E3/uL   Immature Granulocytes 0 Not Estab. %   Immature Grans (Abs) 0.0 0.0 - 0.1 [id number]  Basic Metabolic Panel  Result Value Ref Range   Glucose 198 (H) 65 - 99 mg/dL   BUN 18 6 - 20 mg/dL   Creatinine, Ser 0.83 0.57 - 1.00 mg/dL   GFR calc non Af Amer 90 >59 mL/min/1.73   GFR calc Af Amer 103 >59 mL/min/1.73   BUN/Creatinine Ratio 22 9 - 23   Sodium 138 134 - 144 mmol/L   Potassium 4.4 3.5 - 5.2 mmol/L   Chloride 100 96 - 106 mmol/L   CO2 23 20 - 29 mmol/L   Calcium 9.6 8.7 - 10.2 mg/dL  POCT glucose (manual entry)  Result Value Ref Range   POC Glucose 228 (A) 70 - 99 mg/dl  POCT glycosylated hemoglobin (Hb A1C)  Result Value Ref Range   Hemoglobin A1C     HbA1c POC (<> result, manual entry)     HbA1c, POC (prediabetic range)     HbA1c, POC (controlled diabetic range) 10.3 (A) 0.0 - 7.0 %    CMP Latest Ref Rng & Units 09/15/2018 07/12/2018 07/11/2018  Glucose 65 - 99 mg/dL 198(H) 421(H) 186(H)  BUN 6 - 20 mg/dL _0 Creatinine 0.57 - 1.00 mg/dL 0.83 0.76 0.67  Sodium 134 - 144 mmol/L 138 135  137  Potassium 3.5 - 5.2 mmol/L 4.4 4.1 3.8  Chloride 96 - 106 mmol/L 100 97(L) 99  CO2 20 - 29 mmol/L _1 Calcium 8.7 - 10.2 mg/dL 9.6 8.3(L) 9.2  Total Protein 6.5 - 8.1 g/dL - 6.2(L) 7.2  Total Bilirubin 0.3 - 1.2 mg/dL - 0.4 0.6  Alkaline Phos 38 - 126 U/L - 60 69  AST 15 - 41 U/L -  19 23  ALT 0 - 44 U/L - 21 27   Lipid Panel     Component Value Date/Time   CHOL 246 (H) 02/06/2018 1632   TRIG 260 (H) 02/06/2018 1632   HDL 46 02/06/2018 1632   CHOLHDL 5.3 (H) 02/06/2018 1632   CHOLHDL 5.3 (H) 09/21/2015 1740   VLDL 56 (H) 09/21/2015 1740   LDLCALC 148 (H) 02/06/2018 1632    CBC    Component Value Date/Time   WBC 7.4 09/15/2018 1702   WBC 7.5 07/12/2018 0346   RBC 4.06 09/15/2018 1702   RBC 3.35 (L) 07/12/2018 0346   HGB 11.2 09/15/2018 1702   HCT 33.8 (L) 09/15/2018 1702   PLT 278 09/15/2018 1702   MCV 83 09/15/2018 1702   MCH 27.6 09/15/2018 1702   MCH 28.7 07/12/2018 0346   MCHC 33.1 09/15/2018 1702   MCHC 31.8 07/12/2018 0346   RDW 13.3 09/15/2018 1702   LYMPHSABS 2.5 09/15/2018 1702   MONOABS 0.5 07/11/2018 1616   EOSABS 0.2 09/15/2018 1702   BASOSABS 0.0 09/15/2018 1702    ASSESSMENT AND PLAN:  1. Uncontrolled type 2 diabetes mellitus without complication, with long-term current use of insulin ([place]) Dietary counseling given. Increase Lantus to 38 units. Encourage her to check blood sugars 2-3 times a day before meals.  We will try to get the [place] meter approved. Follow-up with clinical pharmacist in 2 to 3 weeks and bring in readings with her. Since nausea and vomiting have resolved, will observe for now.  If there is a recurrence we will stop Byetta and do a gastric emptying study. - POCT glucose (manual entry) - POCT glycosylated hemoglobin (Hb H9Q) - Basic Metabolic Panel - insulin aspart (NOVOLOG FLEXPEN) 100 UNIT/ML FlexPen; Inject 24 Units into the skin 2 (two) times daily before a meal. 15 units subcut with the two largest meals of the day   Dispense: 15 mL; Refill: 3 - Insulin Glargine (LANTUS SOLOSTAR) 100 UNIT/ML Solostar Pen; Inject 38 Units into the skin daily.  Dispense: 15 mL; Refill: 11 - atorvastatin (LIPITOR) 10 MG tablet; Take 1 tablet (10 mg total) by mouth daily.  Dispense: 90 tablet; Refill: 3 - sitaGLIPtin (JANUVIA) 50 MG tablet; Take 1 tablet (50 mg total) by mouth daily.  Dispense: 30 tablet; Refill: 11  2. 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  Will repeat x-ray and check sed rate to eval for signs of osteomyelitis.  If x-ray is negative we will consider an MRI.  On looking at images that she has on her phone compared to what I am seeing today there does appear to be improvement in that the skin on the tip of the toe has healed up however there is significant progression and new ulceration on the ball of the first metatarsal.  Urgent referral to podiatry.  Recommend Unna boot to take pressure off this area but patient states that would not work for her due to her back issues. -We will put her on a course of antibiotics.  Advised her to continue daily dressing changes - DG Foot Complete Left; Future - Sedimentation Rate - CBC with Differential/Platelet - amoxicillin-clavulanate (AUGMENTIN) 500-125 MG tablet; Take 1 tablet (500 mg total) by mouth 2 (two) times daily.  Dispense: 14 tablet; Refill: 0 - ciprofloxacin (CIPRO) 500 MG tablet; Take 1 tablet (500 mg total) by mouth 2 (two) times daily.  Dispense: 14 tablet; Refill:  0 - Ambulatory referral to Podiatry  3. Essential hypertension Not at goal Inc HCTZ to 25 mg Encourage salt restriction - hydrochlorothiazide (HYDRODIURIL) 25 MG tablet; Take 1 tablet (25 mg total) by mouth daily.  Dispense: 90 tablet; Refill: 2 - carvedilol (COREG) 3.125 MG tablet; Take 1 tablet (3.125 mg total) by mouth 2 (two) times daily with a meal.  Dispense: 60 tablet; Refill: 6  4. Class 3 severe  obesity due to excess calories with serious comorbidity and body mass index (BMI) of 50.0 to 59.9 in adult [facility]) Healthy eating habits discussed and encouraged.  Advised to cut back on pork and red meat and white carbohydrates.  5. Leg edema, left Hopefully increasing hydrochlorothiazide will help.  Advised to keep the leg elevated  6. Anxiety and depression Refill Lexapro - escitalopram (LEXAPRO) 20 MG tablet; Take 1 tablet (20 mg total) by mouth daily.  Dispense: 90 tablet; Refill: 2  7.  Dermatitis Triamcinolone cream prescribed.  I have given a limited prescription for Xanax for her to take 30 minutes prior to her bone scan that is scheduled for tomorrow.  Patient was given the opportunity to ask questions.  Patient verbalized understanding of the plan and was able to repeat key elements of the plan.   Orders Placed This Encounter  Procedures  . DG Foot Complete Left  . Sedimentation Rate  . CBC with Differential/Platelet  . Basic Metabolic Panel  . Ambulatory referral to Podiatry  . POCT glucose (manual entry)  . POCT glycosylated hemoglobin (Hb A1C)     Requested Prescriptions   Signed Prescriptions Disp Refills  . amoxicillin-clavulanate (AUGMENTIN) 500-125 MG tablet 14 tablet 0    Sig: Take 1 tablet (500 mg total) by mouth 2 (two) times daily.  . ciprofloxacin (CIPRO) 500 MG tablet 14 tablet 0    Sig: Take 1 tablet (500 mg total) by mouth 2 (two) times daily.  Marland Kitchen ALPRAZolam (XANAX) 1 MG tablet 2 tablet 0    Sig: Take 1 take 30 mins before imaging study.  May repeat x 1 if needed  . insulin aspart (NOVOLOG FLEXPEN) 100 UNIT/ML FlexPen 15 mL 3    Sig: Inject 24 Units into the skin 2 (two) times daily before a meal. 15 units subcut with the two largest meals of the day  . Insulin Glargine (LANTUS SOLOSTAR) 100 UNIT/ML Solostar Pen 15 mL 11    Sig: Inject 38 Units into the skin daily.  Marland Kitchen atorvastatin (LIPITOR) 10 MG tablet 90 tablet 3    Sig: Take 1 tablet (10 mg total)  by mouth daily.  . hydrochlorothiazide (HYDRODIURIL) 25 MG tablet 90 tablet 2    Sig: Take 1 tablet (25 mg total) by mouth daily.  Marland Kitchen escitalopram (LEXAPRO) 20 MG tablet 90 tablet 2    Sig: Take 1 tablet (20 mg total) by mouth daily.  . carvedilol (COREG) 3.125 MG tablet 60 tablet 6    Sig: Take 1 tablet (3.125 mg total) by mouth 2 (two) times daily with a meal.  . sitaGLIPtin (JANUVIA) 50 MG tablet 30 tablet 11    Sig: Take 1 tablet (50 mg total) by mouth daily.  . Continuous Blood Gluc Receiver (FREESTYLE LIBRE READER) DEVI 1 Device 0    Sig: 1 Device by Does not apply route once for 1 dose.  . Continuous Blood Gluc Sensor (FREESTYLE LIBRE SENSOR SYSTEM) MISC 2 each 12    Sig: Change sensor Q 2 wks  . triamcinolone cream (KENALOG) 0.1 %  30 g 0    Sig: Apply 1 application topically 2 (two) times daily.    Return in about 2 months (around 11/14/2018).  Karle Plumber, MD, FACP

## 2018-09-15 NOTE — Telephone Encounter (Signed)
Pt is seeing her PCP today. Will defer to PCP for refills if appropriate.

## 2018-09-15 NOTE — Progress Notes (Signed)
Pt states she is having pain on her left side that has been on going

## 2018-09-15 NOTE — Patient Instructions (Addendum)
Please schedule patient an appointment with luke in 2-3 weeks   Increase Lantus to 38 units daily.  Try to cut back on white carbohydrates and red meats. Try to check your blood sugars at least twice a day before meals and bring in readings on next visit.  I have sent descriptions to the pharmacy for 2 antibiotics for you to take for 1 week.  I have referred you to a podiatrist.  Continue dressing changes at least once a day until you are seen.  Your blood pressure is not at goal.  Increase hydrochlorothiazide to 25 mg daily.

## 2018-09-16 ENCOUNTER — Encounter (HOSPITAL_COMMUNITY): Admission: RE | Admit: 2018-09-16 | Payer: Medicaid Other | Source: Ambulatory Visit

## 2018-09-16 ENCOUNTER — Encounter (HOSPITAL_COMMUNITY): Payer: Medicaid Other

## 2018-09-16 DIAGNOSIS — L97509 Non-pressure chronic ulcer of other part of unspecified foot with unspecified severity: Secondary | ICD-10-CM

## 2018-09-16 DIAGNOSIS — E11621 Type 2 diabetes mellitus with foot ulcer: Secondary | ICD-10-CM | POA: Insufficient documentation

## 2018-09-16 LAB — CBC WITH DIFFERENTIAL/PLATELET
BASOS: 1 %
Basophils Absolute: 0 10*3/uL (ref 0.0–0.2)
EOS (ABSOLUTE): 0.2 10*3/uL (ref 0.0–0.4)
EOS: 2 %
Hematocrit: 33.8 % — ABNORMAL LOW (ref 34.0–46.6)
Hemoglobin: 11.2 g/dL (ref 11.1–15.9)
Immature Grans (Abs): 0 10*3/uL (ref 0.0–0.1)
Immature Granulocytes: 0 %
Lymphocytes Absolute: 2.5 10*3/uL (ref 0.7–3.1)
Lymphs: 34 %
MCH: 27.6 pg (ref 26.6–33.0)
MCHC: 33.1 g/dL (ref 31.5–35.7)
MCV: 83 fL (ref 79–97)
Monocytes Absolute: 0.5 10*3/uL (ref 0.1–0.9)
Monocytes: 7 %
Neutrophils Absolute: 4.2 10*3/uL (ref 1.4–7.0)
Neutrophils: 56 %
Platelets: 278 10*3/uL (ref 150–450)
RBC: 4.06 x10E6/uL (ref 3.77–5.28)
RDW: 13.3 % (ref 11.7–15.4)
WBC: 7.4 10*3/uL (ref 3.4–10.8)

## 2018-09-16 LAB — BASIC METABOLIC PANEL
BUN/Creatinine Ratio: 22 (ref 9–23)
BUN: 18 mg/dL (ref 6–20)
CO2: 23 mmol/L (ref 20–29)
Calcium: 9.6 mg/dL (ref 8.7–10.2)
Chloride: 100 mmol/L (ref 96–106)
Creatinine, Ser: 0.83 mg/dL (ref 0.57–1.00)
GFR calc Af Amer: 103 mL/min/{1.73_m2} (ref >59)
GFR calc non Af Amer: 90 mL/min/{1.73_m2} (ref >59)
Glucose: 198 mg/dL — ABNORMAL HIGH (ref 65–99)
Potassium: 4.4 mmol/L (ref 3.5–5.2)
Sodium: 138 mmol/L (ref 134–144)

## 2018-09-16 LAB — SEDIMENTATION RATE: Sed Rate: 73 mm/hr — ABNORMAL HIGH (ref 0–32)

## 2018-09-16 MED ORDER — TRIAMCINOLONE ACETONIDE 0.1 % EX CREA: 1.0000 "application " | TOPICAL_CREAM | Freq: Two times a day (BID) | CUTANEOUS | 0 refills | Status: DC

## 2018-09-16 MED ORDER — FREESTYLE LIBRE SENSOR SYSTEM MISC: 12 refills | Status: DC

## 2018-09-16 MED ORDER — FREESTYLE LIBRE READER DEVI: 1.0000 | Freq: Once | 0 refills | Status: AC

## 2018-09-22 ENCOUNTER — Encounter: Payer: Self-pay | Admitting: Internal Medicine

## 2018-09-23 ENCOUNTER — Encounter (INDEPENDENT_AMBULATORY_CARE_PROVIDER_SITE_OTHER): Payer: Self-pay | Admitting: Specialist

## 2018-09-23 ENCOUNTER — Ambulatory Visit (INDEPENDENT_AMBULATORY_CARE_PROVIDER_SITE_OTHER): Payer: Medicaid Other | Admitting: Specialist

## 2018-09-23 DIAGNOSIS — Z79899 Other long term (current) drug therapy: Secondary | ICD-10-CM | POA: Diagnosis not present

## 2018-09-24 ENCOUNTER — Other Ambulatory Visit (INDEPENDENT_AMBULATORY_CARE_PROVIDER_SITE_OTHER): Payer: Self-pay | Admitting: Specialist

## 2018-09-24 ENCOUNTER — Telehealth: Payer: Self-pay | Admitting: Internal Medicine

## 2018-09-24 MED ORDER — ALPRAZOLAM 1 MG PO TABS: ORAL_TABLET | ORAL | 0 refills | Status: DC

## 2018-09-24 MED ORDER — LORAZEPAM 1 MG PO TABS: ORAL_TABLET | ORAL | 0 refills | Status: DC

## 2018-09-24 NOTE — Telephone Encounter (Signed)
[place] to call in Ativan 1 Mg Spoke to Wink  Name: Ativan Strength: 1 MG Sig: Take 1 tablet by mouth 30 mins prior to procedure  Quantity: 1 Refills:0

## 2018-09-24 NOTE — Telephone Encounter (Signed)
Before calling in the rx I contacted pt to see if she heard from Dr. Louanne Skye and she has not. Per Dr. Wynetta Emery she will give her a rx for Ativan but if that doesn't work then she will need to speak with Dr. Louanne Skye about doing the procedure with sedation.   Pt states she understands and doesn't have any questions or concerns  Pt is aware that rx has been called in

## 2018-09-25 ENCOUNTER — Ambulatory Visit (HOSPITAL_COMMUNITY)
Admission: RE | Admit: 2018-09-25 | Discharge: 2018-09-25 | Disposition: A | Discharge status: Home / Self Care | Payer: Medicaid Other | Source: Ambulatory Visit | Attending: Specialist | Admitting: Specialist

## 2018-09-25 ENCOUNTER — Encounter (HOSPITAL_COMMUNITY)
Admission: RE | Admit: 2018-09-25 | Discharge: 2018-09-25 | Disposition: A | Discharge status: Home / Self Care | Payer: Medicaid Other | Source: Ambulatory Visit | Attending: Specialist | Admitting: Specialist

## 2018-09-25 ENCOUNTER — Encounter (HOSPITAL_COMMUNITY): Payer: Self-pay | Admitting: Radiology

## 2018-09-25 DIAGNOSIS — G8929 Other chronic pain: Secondary | ICD-10-CM | POA: Insufficient documentation

## 2018-09-25 DIAGNOSIS — M545 Low back pain: Secondary | ICD-10-CM | POA: Diagnosis present

## 2018-09-25 DIAGNOSIS — M25552 Pain in left hip: Secondary | ICD-10-CM | POA: Diagnosis not present

## 2018-09-25 MED ORDER — TECHNETIUM TC 99M MEDRONATE IV KIT
20.0000 | PACK | Freq: Once | INTRAVENOUS | Status: AC | PRN
  Administered 2018-09-25: 20 via INTRAVENOUS

## 2018-10-02 ENCOUNTER — Encounter (HOSPITAL_COMMUNITY): Payer: Self-pay

## 2018-10-02 ENCOUNTER — Observation Stay (HOSPITAL_COMMUNITY)
Admission: EM | Admit: 2018-10-02 | Discharge: 2018-10-04 | Disposition: A | Discharge status: Home / Self Care | Payer: Medicaid Other | Attending: Internal Medicine | Admitting: Internal Medicine

## 2018-10-02 ENCOUNTER — Other Ambulatory Visit: Payer: Self-pay

## 2018-10-02 DIAGNOSIS — L03116 Cellulitis of left lower limb: Secondary | ICD-10-CM | POA: Diagnosis not present

## 2018-10-02 DIAGNOSIS — T428X5A Adverse effect of antiparkinsonism drugs and other central muscle-tone depressants, initial encounter: Secondary | ICD-10-CM | POA: Insufficient documentation

## 2018-10-02 DIAGNOSIS — Z79899 Other long term (current) drug therapy: Secondary | ICD-10-CM | POA: Insufficient documentation

## 2018-10-02 DIAGNOSIS — E119 Type 2 diabetes mellitus without complications: Secondary | ICD-10-CM | POA: Diagnosis present

## 2018-10-02 DIAGNOSIS — T50905A Adverse effect of unspecified drugs, medicaments and biological substances, initial encounter: Secondary | ICD-10-CM

## 2018-10-02 DIAGNOSIS — IMO0002 Reserved for concepts with insufficient information to code with codable children: Secondary | ICD-10-CM | POA: Diagnosis present

## 2018-10-02 DIAGNOSIS — E1129 Type 2 diabetes mellitus with other diabetic kidney complication: Secondary | ICD-10-CM | POA: Diagnosis present

## 2018-10-02 DIAGNOSIS — E11628 Type 2 diabetes mellitus with other skin complications: Secondary | ICD-10-CM | POA: Diagnosis present

## 2018-10-02 DIAGNOSIS — N179 Acute kidney failure, unspecified: Secondary | ICD-10-CM | POA: Diagnosis not present

## 2018-10-02 DIAGNOSIS — R4182 Altered mental status, unspecified: Secondary | ICD-10-CM | POA: Diagnosis present

## 2018-10-02 DIAGNOSIS — Y69 Unspecified misadventure during surgical and medical care: Secondary | ICD-10-CM | POA: Diagnosis not present

## 2018-10-02 DIAGNOSIS — I10 Essential (primary) hypertension: Secondary | ICD-10-CM | POA: Diagnosis present

## 2018-10-02 DIAGNOSIS — S91302A Unspecified open wound, left foot, initial encounter: Secondary | ICD-10-CM | POA: Diagnosis not present

## 2018-10-02 DIAGNOSIS — G9340 Encephalopathy, unspecified: Secondary | ICD-10-CM | POA: Diagnosis not present

## 2018-10-02 DIAGNOSIS — Z794 Long term (current) use of insulin: Secondary | ICD-10-CM | POA: Diagnosis not present

## 2018-10-02 DIAGNOSIS — T887XXA Unspecified adverse effect of drug or medicament, initial encounter: Secondary | ICD-10-CM | POA: Insufficient documentation

## 2018-10-02 DIAGNOSIS — G894 Chronic pain syndrome: Secondary | ICD-10-CM | POA: Diagnosis present

## 2018-10-02 DIAGNOSIS — L089 Local infection of the skin and subcutaneous tissue, unspecified: Secondary | ICD-10-CM | POA: Diagnosis present

## 2018-10-02 DIAGNOSIS — R51 Headache: Secondary | ICD-10-CM | POA: Diagnosis not present

## 2018-10-02 DIAGNOSIS — M4120 Other idiopathic scoliosis, site unspecified: Secondary | ICD-10-CM

## 2018-10-02 DIAGNOSIS — E1165 Type 2 diabetes mellitus with hyperglycemia: Secondary | ICD-10-CM | POA: Diagnosis present

## 2018-10-02 DIAGNOSIS — T426X5A Adverse effect of other antiepileptic and sedative-hypnotic drugs, initial encounter: Secondary | ICD-10-CM | POA: Diagnosis not present

## 2018-10-02 DIAGNOSIS — R400 Somnolence: Secondary | ICD-10-CM

## 2018-10-02 LAB — COMPREHENSIVE METABOLIC PANEL
ALK PHOS: 89 U/L (ref 38–126)
ALT: 21 U/L (ref 0–44)
AST: 19 U/L (ref 15–41)
Albumin: 3.5 g/dL (ref 3.5–5.0)
Anion gap: 8 (ref 5–15)
BUN: 50 mg/dL — ABNORMAL HIGH (ref 6–20)
CALCIUM: 9.6 mg/dL (ref 8.9–10.3)
CO2: 25 mmol/L (ref 22–32)
Chloride: 103 mmol/L (ref 98–111)
Creatinine, Ser: 1.2 mg/dL — ABNORMAL HIGH (ref 0.44–1.00)
GFR calc Af Amer: >60 mL/min (ref >60)
GFR calc non Af Amer: 57 mL/min — ABNORMAL LOW (ref >60)
GLUCOSE: 247 mg/dL — AB (ref 70–99)
Potassium: 4.5 mmol/L (ref 3.5–5.1)
Sodium: 136 mmol/L (ref 135–145)
Total Bilirubin: 0.8 mg/dL (ref 0.3–1.2)
Total Protein: 7.9 g/dL (ref 6.5–8.1)

## 2018-10-02 LAB — CBC
HCT: 31.1 % — ABNORMAL LOW (ref 36.0–46.0)
Hemoglobin: 9.7 g/dL — ABNORMAL LOW (ref 12.0–15.0)
MCH: 27.2 pg (ref 26.0–34.0)
MCHC: 31.2 g/dL (ref 30.0–36.0)
MCV: 87.1 fL (ref 80.0–100.0)
Platelets: 228 10*3/uL (ref 150–400)
RBC: 3.57 MIL/uL — ABNORMAL LOW (ref 3.87–5.11)
RDW: 14.5 % (ref 11.5–15.5)
WBC: 8.7 10*3/uL (ref 4.0–10.5)
nRBC: 0 % (ref 0.0–0.2)

## 2018-10-02 LAB — I-STAT BETA HCG BLOOD, ED (MC, WL, AP ONLY): I-stat hCG, quantitative: <5 m[IU]/mL (ref <5)

## 2018-10-02 MED ORDER — SODIUM CHLORIDE 0.9% FLUSH
3.0000 mL | Freq: Once | INTRAVENOUS | Status: AC
  Administered 2018-10-03: 3 mL via INTRAVENOUS

## 2018-10-02 NOTE — ED Triage Notes (Signed)
Pt here with family reports AMS X2 weeks. Recently had a PET scan r/o cancer and she has been having persistent confusion. Pt alert and answering questions in triage but appears drowsy.

## 2018-10-03 ENCOUNTER — Emergency Department (HOSPITAL_COMMUNITY): Payer: Medicaid Other

## 2018-10-03 ENCOUNTER — Encounter (HOSPITAL_COMMUNITY): Payer: Medicaid Other

## 2018-10-03 DIAGNOSIS — E1165 Type 2 diabetes mellitus with hyperglycemia: Secondary | ICD-10-CM | POA: Diagnosis not present

## 2018-10-03 DIAGNOSIS — N189 Chronic kidney disease, unspecified: Secondary | ICD-10-CM | POA: Diagnosis present

## 2018-10-03 DIAGNOSIS — G894 Chronic pain syndrome: Secondary | ICD-10-CM

## 2018-10-03 DIAGNOSIS — N179 Acute kidney failure, unspecified: Secondary | ICD-10-CM | POA: Diagnosis not present

## 2018-10-03 DIAGNOSIS — L089 Local infection of the skin and subcutaneous tissue, unspecified: Secondary | ICD-10-CM | POA: Diagnosis not present

## 2018-10-03 DIAGNOSIS — E11628 Type 2 diabetes mellitus with other skin complications: Secondary | ICD-10-CM

## 2018-10-03 DIAGNOSIS — R51 Headache: Secondary | ICD-10-CM | POA: Diagnosis not present

## 2018-10-03 DIAGNOSIS — G9340 Encephalopathy, unspecified: Secondary | ICD-10-CM | POA: Diagnosis present

## 2018-10-03 DIAGNOSIS — I10 Essential (primary) hypertension: Secondary | ICD-10-CM | POA: Diagnosis not present

## 2018-10-03 DIAGNOSIS — S91302A Unspecified open wound, left foot, initial encounter: Secondary | ICD-10-CM | POA: Diagnosis not present

## 2018-10-03 LAB — POCT I-STAT EG7
Acid-base deficit: 1 mmol/L (ref 0.0–2.0)
Bicarbonate: 24.9 mmol/L (ref 20.0–28.0)
Calcium, Ion: 1.28 mmol/L (ref 1.15–1.40)
HCT: 37 % (ref 36.0–46.0)
HEMOGLOBIN: 12.6 g/dL (ref 12.0–15.0)
O2 SAT: 61 %
PCO2 VEN: 43.7 mmHg — AB (ref 44.0–60.0)
PH VEN: 7.364 (ref 7.250–7.430)
Potassium: 4.5 mmol/L (ref 3.5–5.1)
Sodium: 138 mmol/L (ref 135–145)
TCO2: 26 mmol/L (ref 22–32)
pO2, Ven: 33 mmHg (ref 32.0–45.0)

## 2018-10-03 LAB — BASIC METABOLIC PANEL
Anion gap: 8 (ref 5–15)
BUN: 42 mg/dL — ABNORMAL HIGH (ref 6–20)
CO2: 22 mmol/L (ref 22–32)
Calcium: 9.5 mg/dL (ref 8.9–10.3)
Chloride: 105 mmol/L (ref 98–111)
Creatinine, Ser: 1.01 mg/dL — ABNORMAL HIGH (ref 0.44–1.00)
GFR calc Af Amer: >60 mL/min (ref >60)
GFR calc non Af Amer: >60 mL/min (ref >60)
Glucose, Bld: 259 mg/dL — ABNORMAL HIGH (ref 70–99)
Potassium: 4.6 mmol/L (ref 3.5–5.1)
SODIUM: 135 mmol/L (ref 135–145)

## 2018-10-03 LAB — ETHANOL

## 2018-10-03 LAB — GLUCOSE, CAPILLARY
GLUCOSE-CAPILLARY: 222 mg/dL — AB (ref 70–99)
Glucose-Capillary: 214 mg/dL — ABNORMAL HIGH (ref 70–99)
Glucose-Capillary: 166 mg/dL — ABNORMAL HIGH (ref 70–99)
Glucose-Capillary: 192 mg/dL — ABNORMAL HIGH (ref 70–99)

## 2018-10-03 LAB — AMMONIA: Ammonia: 33 umol/L (ref 9–35)

## 2018-10-03 LAB — LACTIC ACID, PLASMA
Lactic Acid, Venous: 1.2 mmol/L (ref 0.5–1.9)
Lactic Acid, Venous: 1.5 mmol/L (ref 0.5–1.9)

## 2018-10-03 MED ORDER — ONDANSETRON HCL 4 MG PO TABS: 4.0000 mg | ORAL_TABLET | Freq: Four times a day (QID) | ORAL | Status: DC | PRN

## 2018-10-03 MED ORDER — PIPERACILLIN-TAZOBACTAM 3.375 G IVPB 30 MIN
3.3750 g | Freq: Once | INTRAVENOUS | Status: AC
  Administered 2018-10-03: 3.375 g via INTRAVENOUS
  Filled 2018-10-03: qty 50

## 2018-10-03 MED ORDER — INSULIN ASPART 100 UNIT/ML ~~LOC~~ SOLN
6.0000 [IU] | Freq: Three times a day (TID) | SUBCUTANEOUS | Status: DC
  Administered 2018-10-03 – 2018-10-04 (×5): 6 [IU] via SUBCUTANEOUS

## 2018-10-03 MED ORDER — ATORVASTATIN CALCIUM 10 MG PO TABS
10.0000 mg | ORAL_TABLET | Freq: Every day | ORAL | Status: DC
  Administered 2018-10-03 – 2018-10-04 (×2): 10 mg via ORAL
  Filled 2018-10-03 (×2): qty 1

## 2018-10-03 MED ORDER — ACETAMINOPHEN 325 MG PO TABS: 650.0000 mg | ORAL_TABLET | Freq: Four times a day (QID) | ORAL | Status: DC | PRN

## 2018-10-03 MED ORDER — ONDANSETRON HCL 4 MG/2ML IJ SOLN: 4.0000 mg | Freq: Four times a day (QID) | INTRAMUSCULAR | Status: DC | PRN

## 2018-10-03 MED ORDER — CARVEDILOL 3.125 MG PO TABS
3.1250 mg | ORAL_TABLET | Freq: Two times a day (BID) | ORAL | Status: DC
  Administered 2018-10-03 – 2018-10-04 (×3): 3.125 mg via ORAL
  Filled 2018-10-03 (×3): qty 1

## 2018-10-03 MED ORDER — INSULIN GLARGINE 100 UNIT/ML ~~LOC~~ SOLN
20.0000 [IU] | Freq: Every day | SUBCUTANEOUS | Status: DC
  Administered 2018-10-03 – 2018-10-04 (×2): 20 [IU] via SUBCUTANEOUS
  Filled 2018-10-03 (×2): qty 0.2

## 2018-10-03 MED ORDER — SODIUM CHLORIDE 0.9 % IV SOLN
2.0000 g | INTRAVENOUS | Status: DC
  Administered 2018-10-03 – 2018-10-04 (×2): 2 g via INTRAVENOUS
  Filled 2018-10-03 (×2): qty 20

## 2018-10-03 MED ORDER — AMITRIPTYLINE HCL 25 MG PO TABS
25.0000 mg | ORAL_TABLET | Freq: Every day | ORAL | Status: DC
  Administered 2018-10-03: 25 mg via ORAL
  Filled 2018-10-03: qty 1

## 2018-10-03 MED ORDER — INSULIN ASPART 100 UNIT/ML ~~LOC~~ SOLN
0.0000 [IU] | Freq: Three times a day (TID) | SUBCUTANEOUS | Status: DC
  Administered 2018-10-03 (×2): 5 [IU] via SUBCUTANEOUS
  Administered 2018-10-03 – 2018-10-04 (×2): 3 [IU] via SUBCUTANEOUS
  Administered 2018-10-04: 2 [IU] via SUBCUTANEOUS

## 2018-10-03 MED ORDER — GLUCERNA SHAKE PO LIQD: 237.0000 mL | Freq: Every day | ORAL | Status: DC

## 2018-10-03 MED ORDER — HYDRALAZINE HCL 20 MG/ML IJ SOLN
10.0000 mg | Freq: Four times a day (QID) | INTRAMUSCULAR | Status: DC | PRN
  Administered 2018-10-03: 10 mg via INTRAVENOUS

## 2018-10-03 MED ORDER — METRONIDAZOLE 500 MG PO TABS
500.0000 mg | ORAL_TABLET | Freq: Three times a day (TID) | ORAL | Status: DC
  Administered 2018-10-03 – 2018-10-04 (×4): 500 mg via ORAL
  Filled 2018-10-03 (×4): qty 1

## 2018-10-03 MED ORDER — PIPERACILLIN-TAZOBACTAM 3.375 G IVPB: 3.3750 g | Freq: Three times a day (TID) | INTRAVENOUS | Status: DC

## 2018-10-03 MED ORDER — ESCITALOPRAM OXALATE 20 MG PO TABS
20.0000 mg | ORAL_TABLET | Freq: Every day | ORAL | Status: DC
  Administered 2018-10-03 – 2018-10-04 (×2): 20 mg via ORAL
  Filled 2018-10-03: qty 2
  Filled 2018-10-03: qty 1
  Filled 2018-10-03: qty 2
  Filled 2018-10-03: qty 1

## 2018-10-03 MED ORDER — EXENATIDE 5 MCG/0.02ML ~~LOC~~ SOPN: 5.0000 ug | PEN_INJECTOR | Freq: Two times a day (BID) | SUBCUTANEOUS | Status: DC

## 2018-10-03 MED ORDER — ENOXAPARIN SODIUM 40 MG/0.4ML ~~LOC~~ SOLN
40.0000 mg | SUBCUTANEOUS | Status: DC
  Administered 2018-10-03: 40 mg via SUBCUTANEOUS
  Filled 2018-10-03: qty 0.4

## 2018-10-03 MED ORDER — HYDRALAZINE HCL 20 MG/ML IJ SOLN
INTRAMUSCULAR | Status: AC
  Administered 2018-10-03: 10 mg via INTRAVENOUS
  Filled 2018-10-03: qty 1

## 2018-10-03 MED ORDER — PRO-STAT SUGAR FREE PO LIQD
30.0000 mL | Freq: Every day | ORAL | Status: DC
  Administered 2018-10-04: 30 mL via ORAL
  Filled 2018-10-03: qty 30

## 2018-10-03 MED ORDER — ALBUTEROL SULFATE (2.5 MG/3ML) 0.083% IN NEBU: 3.0000 mL | INHALATION_SOLUTION | Freq: Four times a day (QID) | RESPIRATORY_TRACT | Status: DC | PRN

## 2018-10-03 MED ORDER — LINAGLIPTIN 5 MG PO TABS
5.0000 mg | ORAL_TABLET | Freq: Every day | ORAL | Status: DC
  Administered 2018-10-03 – 2018-10-04 (×2): 5 mg via ORAL
  Filled 2018-10-03 (×2): qty 1

## 2018-10-03 MED ORDER — SODIUM CHLORIDE 0.9 % IV BOLUS
1000.0000 mL | Freq: Once | INTRAVENOUS | Status: AC
  Administered 2018-10-03: 1000 mL via INTRAVENOUS

## 2018-10-03 MED ORDER — VANCOMYCIN HCL 10 G IV SOLR
2000.0000 mg | Freq: Once | INTRAVENOUS | Status: AC
  Administered 2018-10-03: 2000 mg via INTRAVENOUS
  Filled 2018-10-03: qty 2000

## 2018-10-03 MED ORDER — NALOXONE HCL 0.4 MG/ML IJ SOLN: 0.1000 mg | Freq: Once | INTRAMUSCULAR | Status: DC

## 2018-10-03 MED ORDER — VANCOMYCIN HCL 10 G IV SOLR
2000.0000 mg | INTRAVENOUS | Status: DC
  Filled 2018-10-03: qty 2000

## 2018-10-03 MED ORDER — SODIUM CHLORIDE 0.9 % IV SOLN
INTRAVENOUS | Status: DC
  Administered 2018-10-03: 03:00:00 via INTRAVENOUS

## 2018-10-03 MED ORDER — ACETAMINOPHEN 650 MG RE SUPP: 650.0000 mg | Freq: Four times a day (QID) | RECTAL | Status: DC | PRN

## 2018-10-03 MED ORDER — PANTOPRAZOLE SODIUM 40 MG PO TBEC
40.0000 mg | DELAYED_RELEASE_TABLET | Freq: Every day | ORAL | Status: DC
  Administered 2018-10-03 – 2018-10-04 (×2): 40 mg via ORAL
  Filled 2018-10-03 (×2): qty 1

## 2018-10-03 NOTE — Plan of Care (Signed)
38 year old female admitted this morning with acute change in mental status with history of type 2 diabetes hypertension chronic pain syndrome.  I saw her this morning she was sleeping but was able to wake her up and she was able to follow a conversation.  Husband was sleeping by the bedside.  She was awake alert and oriented.  Continue to hold narcotics Zanaflex Neurontin.  CT head showed no acute abnormality.  Patient has chronic left great toe ulcer.  She is on vancomycin Rocephin and Flagyl.  Await wound care consult.  GI improved with IV fluids and holding ACE inhibitor NSAIDs and hydrochlorothiazide.  Discussed with patient and husband.

## 2018-10-03 NOTE — H&P (Signed)
History and Physical    Briana Williams:[id number] DOB: Feb 27, 1981 DOA: 10/02/2018  PCP: Ladell Pier, MD  Patient coming from: Home  I have personally briefly reviewed patient's old medical records in [place]  Chief Complaint: AMS  HPI: Briana Williams is a 38 y.o. female with medical history significant of DM2, HTN, chronic pain syndrome.  Patient brought to ED by family members due to confusion, lethargy, falling asleep during conversations for about the past 1 week or so.  Reportedly she was recently taken off of belbuca and MSIR and instead put on zanaflex (09/17/2018).  She is also on gabapentin.  Other issues include a L chronic toe wound present since November.  She has no known malignancy, but due to concern for possible spine lesions, she had a NM bone scan on Friday last week.   ED Course: Put on zosyn / vanc for the toe cellulitis / ulcer.  UDS pending.  Afebrile.  No WBC.  Does have mild AKI with creat 1.2 and BUN 50 up from 18 and 0.83 baseline.  ABG also unremarkable.  CT head pending.  Patient seems to be more alert when I saw her coming back from CT.   Review of Systems: As per HPI otherwise 10 point review of systems negative.   Past Medical History:  Diagnosis Date  . Arthritis   . GERD (gastroesophageal reflux disease)   . Headache    migraine 2 weeks ago  . Hyperlipidemia   . Hypertension associated with diabetes ([place])   . Renal disorder    has both kidneys, compacted and 'squashed together"  . Scoliosis   . Sleep apnea    in the midst of being tested for it.   waiting on scheduling   . Type 2 diabetes mellitus treated with insulin ([place])    dx 2000    Past Surgical History:  Procedure Laterality Date  . BACK SURGERY    . CERVICAL FUSION    . frontal closed sutures to forehead    . Harrington Rod placement    . HIP SURGERY     staph infection  . LUMBAR FUSION    . RADIOLOGY WITH ANESTHESIA N/A 02/25/2017   Procedure:  RADIOLOGY WITH ANESTHESIA/MRI THORACIC SPINE AND LEFT SHOULDER WITHOUT CONTRAST.;  Surgeon: Radiologist, Medication, MD;  Location: [facility];  Service: Radiology;  Laterality: N/A;     reports that she has never smoked. She has never used smokeless tobacco. She reports that she does not drink alcohol or use drugs.  Allergies  Allergen Reactions  . Hydrocodone Itching    Note: benadryl does NOT help  . Metformin And Related Other (See Comments)    Causes blisters on skin  . Norvasc [Amlodipine Besylate] Swelling    Ankle swelling  . Ultrasound Gel Rash and Other (See Comments)    Patient gets a red rash with use of ultrasound gel/ burns skin    Family History  Problem Relation Age of Onset  . Cancer Maternal Grandmother        mandibular cancer   . Diabetes Mother   . Heart failure Mother   . CAD Mother        triple CABG  . Diabetes Maternal Aunt   . Stroke Father      Prior to Admission medications   Medication Sig Start Date End Date Taking? Authorizing Provider  ACCU-CHEK SOFTCLIX LANCETS lancets 1 each by Other route 3 (three) times daily. ICD 10 E11.9  09/21/15   Funches, Adriana Mccallum, MD  albuterol (PROVENTIL HFA;VENTOLIN HFA) 108 (90 Base) MCG/ACT inhaler Inhale 1 puff into the lungs every 6 (six) hours as needed for wheezing or shortness of breath. 09/21/15   Funches, Adriana Mccallum, MD  ALPRAZolam Duanne Moron) 1 MG tablet Take 1 take 30 mins before imaging study.  May repeat x 1 if needed 09/24/18   Jessy Oto, MD  amitriptyline (ELAVIL) 25 MG tablet Take 1 tablet (25 mg total) by mouth at bedtime. 06/23/18   Ladell Pier, MD  atorvastatin (LIPITOR) 10 MG tablet Take 1 tablet (10 mg total) by mouth daily. 09/15/18   Ladell Pier, MD  Blood Glucose Monitoring Suppl (ACCU-CHEK AVIVA PLUS) w/Device KIT 1 Device by Does not apply route 3 (three) times daily after meals. ICD 10 E11.9 09/21/15   Funches, Adriana Mccallum, MD  BYETTA 5 MCG PEN 5 MCG/0.02ML SOPN injection ADMINISTER 5 MCG UNDER  THE SKIN TWICE DAILY WITH A MEAL Patient taking differently: Inject 5 mcg into the skin 2 (two) times daily with a meal.  10/06/17   Ladell Pier, MD  carvedilol (COREG) 3.125 MG tablet Take 1 tablet (3.125 mg total) by mouth 2 (two) times daily with a meal. 09/15/18   Ladell Pier, MD  Continuous Blood Gluc Sensor (FREESTYLE LIBRE SENSOR SYSTEM) MISC Change sensor Q 2 wks 09/16/18   Ladell Pier, MD  diclofenac (VOLTAREN) 75 MG EC tablet TAKE 1 TABLET(75 MG) BY MOUTH TWICE DAILY 11/10/17   Jessy Oto, MD  escitalopram (LEXAPRO) 20 MG tablet Take 1 tablet (20 mg total) by mouth daily. 09/15/18   Ladell Pier, MD  fluticasone (FLONASE) 50 MCG/ACT nasal spray Place 2 sprays into both nostrils daily. 08/19/17   Ladell Pier, MD  gabapentin (NEURONTIN) 100 MG capsule TAKE 3 CAPSULES BY MOUTH THREE TIMES DAILY Patient taking differently: Take 300 mg by mouth 3 (three) times daily.  07/04/18   Ladell Pier, MD  glucose blood (ACCU-CHEK AVIVA PLUS) test strip 1 each by Other route 3 (three) times daily. ICD 10 E11.9 09/21/15   [name], MD  hydrochlorothiazide (HYDRODIURIL) 25 MG tablet Take 1 tablet (25 mg total) by mouth daily. 09/15/18   Ladell Pier, MD  insulin aspart (NOVOLOG FLEXPEN) 100 UNIT/ML FlexPen Inject 24 Units into the skin 2 (two) times daily before a meal. 15 units subcut with the two largest meals of the day 09/15/18   Ladell Pier, MD  Insulin Glargine (LANTUS SOLOSTAR) 100 UNIT/ML Solostar Pen Inject 38 Units into the skin daily. 09/15/18   Ladell Pier, MD  Insulin Pen Needle (B-D ULTRAFINE III SHORT PEN) 31G X 8 MM MISC 1 application by Does not apply route daily. 01/02/17   [name], MD  Lancet [facility]) lancets 1 each by Other route 3 (three) times daily. ICD 10 E11.9 09/21/15   [name], MD  lisinopril (PRINIVIL,ZESTRIL) 40 MG tablet TAKE 1 TABLET BY MOUTH DAILY 09/16/18   Ladell Pier, MD    LORazepam (ATIVAN) 1 MG tablet Take 1 tab 1/2 hr to 1 hr prior to imaging study. 09/24/18   Ladell Pier, MD  meloxicam (MOBIC) 15 MG tablet Take 15 mg by mouth daily.    [provider]  pantoprazole (PROTONIX) 40 MG tablet Take 1 tablet (40 mg total) by mouth daily. 02/04/17   Funches, Adriana Mccallum, MD  sitaGLIPtin (JANUVIA) 50 MG tablet Take 1 tablet (50 mg total) by mouth  daily. 09/15/18   Ladell Pier, MD  SUMAtriptan (IMITREX) 25 MG tablet Take 1-2 tabs at start of headache. May repeat in 2 hrs if no relief. Max 4 tabs/24 hr. 03/19/17   Ladell Pier, MD  tiZANidine (ZANAFLEX) 4 MG tablet TAKE 1 TABLET(4 MG) BY MOUTH EVERY 8 HOURS AS NEEDED FOR MUSCLE SPASMS 09/17/18   Ladell Pier, MD  triamcinolone cream (KENALOG) 0.1 % Apply 1 application topically 2 (two) times daily. 09/16/18   Ladell Pier, MD    Physical Exam: Vitals:   10/02/18 2054 10/03/18 0030 10/03/18 0045 10/03/18 0100  BP: 134/82 127/66 (!) 124/92 121/84  Pulse: 90     Resp: 16     Temp:      TempSrc:      SpO2: 96%       Constitutional: NAD, calm, comfortable Eyes: PERRL, lids and conjunctivae normal ENMT: Mucous membranes are moist. Posterior pharynx clear of any exudate or lesions.Normal dentition.  Neck: normal, supple, no masses, no thyromegaly Respiratory: clear to auscultation bilaterally, no wheezing, no crackles. Normal respiratory effort. No accessory muscle use.  Cardiovascular: Regular rate and rhythm, no murmurs / rubs / gallops. No extremity edema. 2+ pedal pulses. No carotid bruits.  Abdomen: no tenderness, no masses palpated. No hepatosplenomegaly. Bowel sounds positive.  Musculoskeletal: no clubbing / cyanosis. No joint deformity upper and lower extremities. Good ROM, no contractures. Normal muscle tone.  Skin: L great toe and ball of foot ulcer with some surrounding cellulitis. Neurologic: CN 2-12 grossly intact. Sensation intact, DTR normal. Strength 5/5 in all 4.   Psychiatric: Normal judgment and insight. Alert and oriented x 3. Normal mood.    Labs on Admission: I have personally reviewed following labs and imaging studies  CBC: Recent Labs  Lab 10/02/18 1802 10/03/18 0131  WBC 8.7  --   HGB 9.7* 12.6  HCT 31.1* 37.0  MCV 87.1  --   PLT 228  --    Basic Metabolic Panel: Recent Labs  Lab 10/02/18 1802 10/03/18 0131  NA 136 138  K 4.5 4.5  CL 103  --   CO2 25  --   GLUCOSE 247*  --   BUN 50*  --   CREATININE 1.20*  --   CALCIUM 9.6  --    GFR: CrCl cannot be calculated (Unknown ideal weight.). Liver Function Tests: Recent Labs  Lab 10/02/18 1802  AST 19  ALT 21  ALKPHOS 89  BILITOT 0.8  PROT 7.9  ALBUMIN 3.5   No results for input(s): LIPASE, AMYLASE in the last 168 hours. No results for input(s): AMMONIA in the last 168 hours. Coagulation Profile: No results for input(s): INR, PROTIME in the last 168 hours. Cardiac Enzymes: No results for input(s): CKTOTAL, CKMB, CKMBINDEX, TROPONINI in the last 168 hours. BNP (last 3 results) No results for input(s): PROBNP in the last 8760 hours. HbA1C: No results for input(s): HGBA1C in the last 72 hours. CBG: No results for input(s): GLUCAP in the last 168 hours. Lipid Profile: No results for input(s): CHOL, HDL, LDLCALC, TRIG, CHOLHDL, LDLDIRECT in the last 72 hours. Thyroid Function Tests: No results for input(s): TSH, T4TOTAL, FREET4, T3FREE, THYROIDAB in the last 72 hours. Anemia Panel: No results for input(s): VITAMINB12, FOLATE, FERRITIN, TIBC, IRON, RETICCTPCT in the last 72 hours. Urine analysis:    Component Value Date/Time   COLORURINE YELLOW 01/12/2011 2009   APPEARANCEUR CLOUDY (A) 01/12/2011 2009   LABSPEC 1.039 (H) 01/12/2011 2009  PHURINE 6.0 01/12/2011 2009   GLUCOSEU >1000 (A) 01/12/2011 2009   HGBUR LARGE (A) 01/12/2011 2009   HGBUR negative 10/04/2010 0947   BILIRUBINUR NEGATIVE 01/12/2011 2009   KETONESUR 15 (A) 01/12/2011 2009   PROTEINUR 100  (A) 01/12/2011 2009   UROBILINOGEN 0.2 01/12/2011 2009   NITRITE POSITIVE (A) 01/12/2011 2009   LEUKOCYTESUR NEGATIVE 01/12/2011 2009    Radiological Exams on Admission: Dg Foot Complete Left  Result Date: 10/03/2018 CLINICAL DATA:  Chronic foot wound EXAM: LEFT FOOT - COMPLETE 3+ VIEW COMPARISON:  07/11/2018 FINDINGS: Similar appearance of the foot with prominent soft tissue swelling and/or edema more so along the dorsum of the forefoot. No acute fracture or bone destruction. Accessory ossicles are seen adjacent to the tarsal navicular. Small bony exostosis is seen along the lateral aspect of the fifth metatarsal. Calcaneal enthesopathy is seen along the dorsal aspect. IMPRESSION: Soft tissue swelling without acute osseous abnormality. No radiographic evidence of acute osteomyelitis. Electronically Signed   By: Ashley Royalty M.D.   On: 10/03/2018 01:35    EKG: Independently reviewed.  Assessment/Plan Principal Problem:   Acute encephalopathy Active Problems:   Diabetes type 2, uncontrolled (HCC)   Essential hypertension, benign   Chronic pain syndrome   Toe infection   Diabetic foot infection ([place])   AKI (acute kidney injury) ([place])    1. Acute encephalopathy - 1. Suspect medication related 2. Hold Zanaflex and Neurontin 3. UDS pending, supposed to be off of MSIR and belbuca 4. CT head pending 2. L great toe cellulitis and ulcer - 1. Foot ulcer pathway 2. Will de-escalate ABx slightly: 1. Continue vanc 2. De-escalate Zosyn to rocephin + Flagyl 3. Wound care consult 4. ABI 5. May wish to give Dr. Sharol Given a call in AM 3. Mild AKI - 1. IVF: 1L bolus in ED and 75 cc/hr 2. Hold HCTZ, ACEi, neurontin, NSAIDs 3. Repeat BMP in AM 4. HTN - 1. Hold HCTZ 2. Hold ACEi 3. Continue other home BP meds 5. CPS - 1. Supposed to be off of belbuca and MSIR 2. Will be holding zanaflex and neurontin due to altered mental status  DVT prophylaxis: Lovenox Code Status: Full Family  Communication: No family in room Disposition Plan: Home after admit Consults called: None Admission status: Place in 32    [place], [place] Hospital[facility]  How to contact the [facility] Attending or Consulting provider [place] or covering provider during after hours [place], for this patient?  1. Check the care team in [facility] and look for a) attending/consulting TR[facility] provider listed and b) the [facility] team listed 2. Log into [email or web address]  Amion Physician Scheduling and messaging for groups and whole hospitals  On call and physician scheduling software for group practices, residents, hospitalists and other medical providers for call, clinic, rotation and shift schedules. OnCall [employer] is a hospital-wide system for scheduling doctors and paging doctors on call. EasyPlot is for scientific plotting and data analysis.  [email or web address]  and use 's universal password to access. If you do not have the password, please contact the hospital operator.  3. Locate the [facility] provider you are looking for under [facility] and page to a number that you can be directly reached. 4. If you still have difficulty reaching the provider, please page the [facility] (Director on Call) for the Hospitalists listed on amion for assistance.  10/03/2018, 2:01 AM

## 2018-10-03 NOTE — Progress Notes (Signed)
VASCULAR LAB    Patient had normal ABI done 06/2018.  See results below.  Does patient need a repeat ABI?  Please advise.     LOWER EXTREMITY DOPPLER STUDY 07/11/2018  Indications: Ulceration.    Performing Technologist: Abram Sander RVS    Examination Guidelines: A complete evaluation includes at minimum, Doppler waveform signals and systolic blood pressure reading at the level of bilateral brachial, anterior tibial, and posterior tibial arteries, when vessel segments are accessible. Bilateral testing is considered an integral part of a complete examination. Photoelectric Plethysmograph (PPG) waveforms and toe systolic pressure readings are included as required and additional duplex testing as needed. Limited examinations for reoccurring indications may be performed as noted.    ABI Findings: +--------+------------------+-----+---------+--------+ Right   Rt Pressure (mmHg)IndexWaveform Comment  +--------+------------------+-----+---------+--------+ [id number]                    triphasic         +--------+------------------+-----+---------+--------+ PTA     194               1.13 triphasic         +--------+------------------+-----+---------+--------+ DP      209               1.22 triphasic         +--------+------------------+-----+---------+--------+  +--------+------------------+-----+---------+-------+ Left    Lt Pressure (mmHg)IndexWaveform Comment +--------+------------------+-----+---------+-------+ [id number]                    triphasic        +--------+------------------+-----+---------+-------+ PTA     202               1.17 triphasic        +--------+------------------+-----+---------+-------+ DP      218               1.27 triphasic        +--------+------------------+-----+---------+-------+  +-------+-----------+-----------+------------+------------+ ABI/TBIToday's ABIToday's TBIPrevious  ABIPrevious TBI +-------+-----------+-----------+------------+------------+ Right  1.22                                           +-------+-----------+-----------+------------+------------+ Left   1.27                                           +-------+-----------+-----------+------------+------------+      Summary: Right: Resting right ankle-brachial index is within normal range. No evidence of significant right lower extremity arterial disease.  Left: Resting left ankle-brachial index is within normal range. No evidence of significant left lower extremity arterial disease.     *See table(s) above for measurements and observations.     Sahasra Belue, RVT 10/03/2018, 8:01 AM

## 2018-10-03 NOTE — Progress Notes (Signed)
CSW acknowledges consult for the patient. At this time, social work does not assist with getting medications. If medication management is needed, please consult case management.   Domenic Schwab, MSW, [place]

## 2018-10-03 NOTE — Progress Notes (Signed)
Initial Nutrition Assessment  DOCUMENTATION CODES:   Morbid obesity  INTERVENTION:  Provide Glucerna Shake po once daily, each supplement provides 220 kcal and 10 grams of protein.  Provide 30 ml Prostat po once daily, each supplement provides 100 kcal and 15 grams of protein.   Encourage adequate PO intake.   NUTRITION DIAGNOSIS:   Increased nutrient needs related to wound healing as evidenced by estimated needs.  GOAL:   Patient will meet greater than or equal to 90% of their needs  MONITOR:   PO intake, Supplement acceptance, Labs, Weight trends, I & O's, Skin  REASON FOR ASSESSMENT:   Consult Wound healing  ASSESSMENT:   38 y.o. female with medical history significant of DM2, HTN, chronic pain syndrome. Presents with AMS.   RD consulted for wound healing as pt with chronic left great toe ulcer. Pt reports appetite is fine currently and PTA with usual consumption of 1-2 meals a day with occasional snack in between. Meal completion 10-100% with 10% at breakfast this AM. Pt reports dislike of breakfast tray thus did not consume most of the meal. Weight loss not significant for time frame. RD to order nutritional supplements to aid in wound healing.   Labs and medications reviewed.   NUTRITION - FOCUSED PHYSICAL EXAM:    Most Recent Value  Orbital Region  No depletion  Upper Arm Region  No depletion  Thoracic and Lumbar Region  No depletion  Buccal Region  No depletion  Temple Region  No depletion  Clavicle Bone Region  No depletion  Clavicle and Acromion Bone Region  No depletion  Scapular Bone Region  No depletion  Dorsal Hand  No depletion  Patellar Region  No depletion  Anterior Thigh Region  No depletion  Posterior Calf Region  No depletion  Edema (RD Assessment)  Mild  Hair  Reviewed  Eyes  Reviewed  Mouth  Reviewed  Skin  Reviewed  Nails  Reviewed       Diet Order:   Diet Order            Diet Carb Modified Fluid consistency: Thin; Room service  appropriate? No  Diet effective now              EDUCATION NEEDS:   Not appropriate for education at this time  Skin:  Skin Assessment: Skin Integrity Issues: Skin Integrity Issues:: Diabetic Ulcer Diabetic Ulcer: L unstageable great toe  Last BM:  3/6  Height:   Ht Readings from Last 1 Encounters:  10/03/18 4' 8.5" (1.435 m)    Weight:   Wt Readings from Last 1 Encounters:  10/03/18 108.4 kg    Ideal Body Weight:  42.6 kg  BMI:  Body mass index is 52.64 kg/m.  Estimated Nutritional Needs:   Kcal:  1800-2000  Protein:  100-110 grams  Fluid:  1.8 - 2 L/day    Corrin Parker, MS, RD, LDN Pager # [phone] After hours/ weekend pager # [phone]

## 2018-10-03 NOTE — ED Notes (Signed)
Patient transported to CT 

## 2018-10-03 NOTE — Progress Notes (Addendum)
Pharmacy Antibiotic Note  Briana Williams is a 38 y.o. female admitted on 10/02/2018 with wound infection.  Pharmacy has been consulted for vancomycin and zosyn dosing.  Plan: Vancomycin 2gm IV q36 hours Zosyn EI F/u renal function, cultures and clinical course    Temp (24hrs), Avg:98.3 F (36.8 C), Min:98.3 F (36.8 C), Max:98.3 F (36.8 C)  Recent Labs  Lab 10/02/18 1802  WBC 8.7  CREATININE 1.20*    CrCl cannot be calculated (Unknown ideal weight.).    Allergies  Allergen Reactions  . Hydrocodone Itching    Note: benadryl does NOT help  . Metformin And Related Other (See Comments)    Causes blisters on skin  . Norvasc [Amlodipine Besylate] Swelling    Ankle swelling  . Ultrasound Gel Rash and Other (See Comments)    Patient gets a red rash with use of ultrasound gel/ burns skin     Thank you for allowing pharmacy to be a part of this patient's care.  Excell Seltzer Poteet 10/03/2018 1:05 AM

## 2018-10-03 NOTE — ED Provider Notes (Signed)
[facility] EMERGENCY DEPARTMENT Provider Note   CSN: [id number] Arrival date & time: 10/02/18  1722    History   Chief Complaint Chief Complaint  Patient presents with  . Altered Mental Status    HPI Briana Williams is a 38 y.o. female.     HPI   Reports about 1 week of confusion if not longer Reports will fall asleep during conversation Reports sons who are asleep are at work, confused about timing, what family is doing Today when husband got home from work she was just sitting on the side of the tub, not acting herself  No other symptoms No focal weakness, numbness, facial droop Speech has been slurred, slowed, fatigue No fevers, no falls, no trauma  Left toe chronic wound since November  Came in last Friday to do [employer] and took 2 mediations for anxiety and has not been the same since then  Taking gabapentin, zanaflex was increased 2/20,  Not taking morphine any more, not taking suboxone anymore, not taking narcotics since 2/20   Past Medical History:  Diagnosis Date  . Arthritis   . GERD (gastroesophageal reflux disease)   . Headache    migraine 2 weeks ago  . Hyperlipidemia   . Hypertension associated with diabetes ([place])   . Renal disorder    has both kidneys, compacted and 'squashed together"  . Scoliosis   . Sleep apnea    in the midst of being tested for it.   waiting on scheduling   . Type 2 diabetes mellitus treated with insulin ([place])    dx 2000    Patient Active Problem List   Diagnosis Date Noted  . Acute encephalopathy 10/03/2018  . AKI (acute kidney injury) ([place]) 10/03/2018  . Diabetic foot ulcer ([place]) 09/16/2018  . Diabetic foot infection ([place]) 07/12/2018  . Morbid obesity ([place]) 07/12/2018  . Toe infection 07/11/2018  . Muscle spasm of back 04/24/2017  . Osteoarthritis of both hips 01/15/2017  . Recurrent headache 01/06/2017  . Irregular menstrual cycle 05/30/2016  . Chronic pain syndrome 09/21/2015  .  HYPERTRIGLYCERIDEMIA 05/02/2009  . 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 Insomnia 05/01/2009  . SCOLIOSIS 05/18/2008  . Diabetes type 2, uncontrolled ([place]) 04/18/2008  . DEPRESSION 04/18/2008  . Essential hypertension, benign 04/18/2008    Past Surgical History:  Procedure Laterality Date  . BACK SURGERY    . CERVICAL FUSION    . frontal closed sutures to forehead    . Harrington Rod placement    . HIP SURGERY     staph infection  . LUMBAR FUSION    . RADIOLOGY WITH ANESTHESIA N/A 02/25/2017   Procedure: RADIOLOGY WITH ANESTHESIA/MRI THORACIC SPINE AND LEFT SHOULDER WITHOUT CONTRAST.;  Surgeon: Radiologist, Medication, MD;  Location: [facility];  Service: Radiology;  Laterality: N/A;     OB History    Gravida  0   Para  0   Term  0   Preterm  0   AB  0   Living  0     SAB  0   TAB  0   Ectopic  0   Multiple  0   Live Births  0            Home Medications    Prior to Admission medications   Medication Sig Start Date End Date Taking? Authorizing Provider  morphine (MSIR) 30 MG tablet Take 30 mg by mouth 3 (three) times daily as needed (pain).  07/24/18  Yes [provider]  tizanidine (ZANAFLEX) 6 MG capsule Take 6 mg by mouth 3 (three) times daily as needed for muscle spasms.   Yes [provider]  ACCU-CHEK SOFTCLIX LANCETS lancets 1 each by Other route 3 (three) times daily. ICD 10 E11.9 09/21/15   [name], MD  ALPRAZolam Duanne Moron) 1 MG tablet Take 1 take 30 mins before imaging study.  May repeat x 1 if needed Patient taking differently: Take 1 mg by mouth as needed for anxiety. Take 1 take 30 mins before imaging study.  May repeat x 1 if needed 09/24/18   Jessy Oto, MD  amitriptyline (ELAVIL) 25 MG tablet Take 1 tablet (25 mg total) by mouth at bedtime. 06/23/18   Ladell Pier, MD  atorvastatin (LIPITOR) 10 MG tablet Take 1 tablet (10 mg total) by mouth daily. 09/15/18   Ladell Pier, MD  Blood Glucose Monitoring Suppl  (ACCU-CHEK AVIVA PLUS) w/Device KIT 1 Device by Does not apply route 3 (three) times daily after meals. ICD 10 E11.9 09/21/15   [name], MD  Buprenorphine HCl (BELBUCA) 450 MCG FILM Place 450 mcg inside cheek 2 (two) times daily.    [provider]  BYETTA 5 MCG PEN 5 MCG/0.02ML SOPN injection ADMINISTER 5 MCG UNDER THE SKIN TWICE DAILY WITH A MEAL Patient taking differently: Inject 5 mcg into the skin 2 (two) times daily with a meal.  10/06/17   Ladell Pier, MD  carvedilol (COREG) 3.125 MG tablet Take 1 tablet (3.125 mg total) by mouth 2 (two) times daily with a meal. 09/15/18   Ladell Pier, MD  Continuous Blood Gluc Sensor (FREESTYLE LIBRE SENSOR SYSTEM) MISC Change sensor Q 2 wks 09/16/18   Ladell Pier, MD  diclofenac (VOLTAREN) 75 MG EC tablet TAKE 1 TABLET(75 MG) BY MOUTH TWICE DAILY 11/10/17   Jessy Oto, MD  escitalopram (LEXAPRO) 20 MG tablet Take 1 tablet (20 mg total) by mouth daily. 09/15/18   Ladell Pier, MD  fluticasone (FLONASE) 50 MCG/ACT nasal spray Place 2 sprays into both nostrils daily. 08/19/17   Ladell Pier, MD  gabapentin (NEURONTIN) 100 MG capsule TAKE 3 CAPSULES BY MOUTH THREE TIMES DAILY Patient taking differently: Take 300 mg by mouth 3 (three) times daily.  07/04/18   Ladell Pier, MD  glucose blood (ACCU-CHEK AVIVA PLUS) test strip 1 each by Other route 3 (three) times daily. ICD 10 E11.9 09/21/15   [name], MD  hydrochlorothiazide (HYDRODIURIL) 25 MG tablet Take 1 tablet (25 mg total) by mouth daily. 09/15/18   Ladell Pier, MD  insulin aspart (NOVOLOG FLEXPEN) 100 UNIT/ML FlexPen Inject 24 Units into the skin 2 (two) times daily before a meal. 15 units subcut with the two largest meals of the day 09/15/18   Ladell Pier, MD  Insulin Glargine (LANTUS SOLOSTAR) 100 UNIT/ML Solostar Pen Inject 38 Units into the skin daily. 09/15/18   Ladell Pier, MD  Insulin Pen Needle (B-D ULTRAFINE III SHORT  PEN) 31G X 8 MM MISC 1 application by Does not apply route daily. 01/02/17   [name], MD  Lancet Devices [facility]) lancets 1 each by Other route 3 (three) times daily. ICD 10 E11.9 09/21/15   Funches, Adriana Mccallum, MD  lisinopril (PRINIVIL,ZESTRIL) 40 MG tablet TAKE 1 TABLET BY MOUTH DAILY Patient taking differently: Take 40 mg by mouth daily.  09/16/18   Ladell Pier, MD  LORazepam (ATIVAN) 1 MG tablet Take 1 tab  1/2 hr to 1 hr prior to imaging study. Patient taking differently: Take 0.5-1 mg by mouth as needed for sedation. Take 1 tab 1/2 hr to 1 hr prior to imaging study. 09/24/18   Ladell Pier, MD  meloxicam (MOBIC) 15 MG tablet Take 15 mg by mouth daily.    [provider]  pantoprazole (PROTONIX) 40 MG tablet Take 1 tablet (40 mg total) by mouth daily. 02/04/17   Funches, Adriana Mccallum, MD  sitaGLIPtin (JANUVIA) 50 MG tablet Take 1 tablet (50 mg total) by mouth daily. 09/15/18   Ladell Pier, MD  SUMAtriptan (IMITREX) 25 MG tablet Take 1-2 tabs at start of headache. May repeat in 2 hrs if no relief. Max 4 tabs/24 hr. Patient taking differently: Take 25-50 mg by mouth every 2 (two) hours as needed for migraine. Take 1-2 tabs at start of headache. May repeat in 2 hrs if no relief. Max 4 tabs/24 hr. 03/19/17   Ladell Pier, MD  tiZANidine (ZANAFLEX) 4 MG tablet TAKE 1 TABLET(4 MG) BY MOUTH EVERY 8 HOURS AS NEEDED FOR MUSCLE SPASMS Patient not taking: No sig reported 09/17/18   Ladell Pier, MD  triamcinolone cream (KENALOG) 0.1 % Apply 1 application topically 2 (two) times daily. 09/16/18   Ladell Pier, MD    Family History Family History  Problem Relation Age of Onset  . Cancer Maternal Grandmother        mandibular cancer   . Diabetes Mother   . Heart failure Mother   . CAD Mother        triple CABG  . Diabetes Maternal Aunt   . Stroke Father     Social History Social History   Tobacco Use  . Smoking status: Never Smoker  .  Smokeless tobacco: Never Used  Substance Use Topics  . Alcohol use: No  . Drug use: No     Allergies   Hydrocodone; Metformin and related; Norvasc [amlodipine besylate]; and Ultrasound gel   Review of Systems Review of Systems  Constitutional: Positive for fatigue. Negative for fever.  HENT: Negative for sore throat.   Eyes: Negative for visual disturbance.  Respiratory: Negative for cough and shortness of breath.   Cardiovascular: Negative for chest pain.  Gastrointestinal: Positive for nausea. Negative for abdominal pain and vomiting.  Genitourinary: Negative for difficulty urinating and dysuria.  Musculoskeletal: Positive for back pain (chroni). Negative for neck pain.  Skin: Negative for rash.  Neurological: Positive for speech difficulty (slurred with fatigue) and headaches (unable tos tate duration but not now). Negative for syncope, facial asymmetry, weakness and numbness.  Psychiatric/Behavioral: Positive for confusion.     Physical Exam Updated Vital Signs BP (!) 187/106 (BP Location: Right Wrist)   Pulse 85   Temp 98.4 F (36.9 C) (Oral)   Resp 18   Ht 4' 8.5" (1.435 m)   Wt 108.4 kg   SpO2 99%   BMI 52.64 kg/m   Physical Exam Vitals signs and nursing note reviewed.  Constitutional:      General: She is not in acute distress.    Appearance: She is well-developed. She is ill-appearing. She is not diaphoretic.     Comments: Sleepy, falling asleep during history  HENT:     Head: Normocephalic and atraumatic.  Eyes:     Conjunctiva/sclera: Conjunctivae normal.  Neck:     Musculoskeletal: Normal range of motion.  Cardiovascular:     Rate and Rhythm: Normal rate and regular rhythm.  Heart sounds: Normal heart sounds. No murmur. No friction rub. No gallop.   Pulmonary:     Effort: Pulmonary effort is normal. No respiratory distress.     Breath sounds: Normal breath sounds. No wheezing or rales.  Abdominal:     General: There is no distension.      Palpations: Abdomen is soft.     Tenderness: There is no abdominal tenderness. There is no guarding.  Musculoskeletal:        General: No tenderness.     Comments: Left great toe wound linear plantar surface at base of toe, surrounding erythema extending up foot   Skin:    General: Skin is warm and dry.     Findings: No erythema or rash.  Neurological:     Mental Status: She is alert and oriented to person, place, and time.     GCS: GCS eye subscore is 4. GCS verbal subscore is 5. GCS motor subscore is 6.     Sensory: Sensation is intact.     Motor: Motor function is intact.     Coordination: Coordination is intact.     Gait: Abnormal gait: deferred.      ED Treatments / Results  Labs (all labs ordered are listed, but only abnormal results are displayed) Labs Reviewed  COMPREHENSIVE METABOLIC PANEL - Abnormal; Notable for the following components:      Result Value   Glucose, Bld 247 (*)    BUN 50 (*)    Creatinine, Ser 1.20 (*)    GFR calc non Af Amer 57 (*)    All other components within normal limits  CBC - Abnormal; Notable for the following components:   RBC 3.57 (*)    Hemoglobin 9.7 (*)    HCT 31.1 (*)    All other components within normal limits  BASIC METABOLIC PANEL - Abnormal; Notable for the following components:   Glucose, Bld 259 (*)    BUN 42 (*)    Creatinine, Ser 1.01 (*)    All other components within normal limits  GLUCOSE, CAPILLARY - Abnormal; Notable for the following components:   Glucose-Capillary 214 (*)    All other components within normal limits  GLUCOSE, CAPILLARY - Abnormal; Notable for the following components:   Glucose-Capillary 222 (*)    All other components within normal limits  GLUCOSE, CAPILLARY - Abnormal; Notable for the following components:   Glucose-Capillary 166 (*)    All other components within normal limits  POCT I-STAT EG7 - Abnormal; Notable for the following components:   pCO2, Ven 43.7 (*)    All other components  within normal limits  CULTURE, BLOOD (ROUTINE X 2)  CULTURE, BLOOD (ROUTINE X 2)  LACTIC ACID, PLASMA  LACTIC ACID, PLASMA  AMMONIA  ETHANOL  RAPID URINE DRUG SCREEN, HOSP PERFORMED  CBC WITH DIFFERENTIAL/PLATELET  COMPREHENSIVE METABOLIC PANEL  I-STAT BETA HCG BLOOD, ED ([facility], [facility], [facility] ONLY)  I-STAT VENOUS BLOOD GAS, ED    EKG None  Radiology Ct Head Wo Contrast  Result Date: 10/03/2018 CLINICAL DATA:  Headache, chronic, neuro deficit EXAM: CT HEAD WITHOUT CONTRAST TECHNIQUE: Contiguous axial images were obtained from the base of the skull through the vertex without intravenous contrast. COMPARISON:  None. FINDINGS: Brain: Brain volume is normal for age. No intracranial hemorrhage, mass effect, or midline shift. No hydrocephalus. Narrowing of the foramen magnum is seen on prior cervical spine MRI. No evidence of territorial infarct or acute ischemia. No extra-axial or intracranial fluid collection. Vascular:  No hyperdense vessel. Skull: There are no acute or suspicious osseous abnormalities. Chronic flattening of left occipital bone. Sinuses/Orbits: Paranasal sinuses and mastoid air cells are clear. The visualized orbits are unremarkable. Other: None. IMPRESSION: No acute intracranial abnormality. Electronically Signed   By: Keith Rake M.D.   On: 10/03/2018 02:08   Dg Foot Complete Left  Result Date: 10/03/2018 CLINICAL DATA:  Chronic foot wound EXAM: LEFT FOOT - COMPLETE 3+ VIEW COMPARISON:  07/11/2018 FINDINGS: Similar appearance of the foot with prominent soft tissue swelling and/or edema more so along the dorsum of the forefoot. No acute fracture or bone destruction. Accessory ossicles are seen adjacent to the tarsal navicular. Small bony exostosis is seen along the lateral aspect of the fifth metatarsal. Calcaneal enthesopathy is seen along the dorsal aspect. IMPRESSION: Soft tissue swelling without acute osseous abnormality. No radiographic evidence of acute osteomyelitis.  Electronically Signed   By: Ashley Royalty M.D.   On: 10/03/2018 01:35    Procedures .Critical Care Performed by: Gareth Morgan, MD Authorized by: Gareth Morgan, MD   Critical care provider statement:    Critical care time (minutes):  45   Critical care was necessary to treat or prevent imminent or life-threatening deterioration of the following conditions:  CNS failure or compromise   Critical care was time spent personally by me on the following activities:  Ordering and review of laboratory studies, ordering and review of radiographic studies, pulse oximetry, re-evaluation of patient's condition, review of old charts, evaluation of patient's response to treatment and development of treatment plan with patient or surrogate   (including critical care time)  Medications Ordered in ED Medications  vancomycin (VANCOCIN) 2,000 mg in sodium chloride 0.9 % 500 mL IVPB (has no administration in time range)  pantoprazole (PROTONIX) EC tablet 40 mg (40 mg Oral Given 10/03/18 1059)  linagliptin (TRADJENTA) tablet 5 mg (5 mg Oral Given 10/03/18 1100)  albuterol (PROVENTIL) (2.5 MG/3ML) 0.083% nebulizer solution 3 mL (has no administration in time range)  amitriptyline (ELAVIL) tablet 25 mg (has no administration in time range)  atorvastatin (LIPITOR) tablet 10 mg (10 mg Oral Given 10/03/18 1100)  exenatide (BYETTA) immediate release injection (5 mcg Subcutaneous Not Given 10/03/18 1557)  carvedilol (COREG) tablet 3.125 mg (3.125 mg Oral Given 10/03/18 1733)  escitalopram (LEXAPRO) tablet 20 mg (20 mg Oral Given 10/03/18 1058)  insulin aspart (novoLOG) injection 0-15 Units (3 Units Subcutaneous Given 10/03/18 1734)  insulin aspart (novoLOG) injection 6 Units (6 Units Subcutaneous Given 10/03/18 1733)  insulin glargine (LANTUS) injection 20 Units (20 Units Subcutaneous Given 10/03/18 1059)  0.9 %  sodium chloride infusion ( Intravenous Stopped 10/03/18 1700)  acetaminophen (TYLENOL) tablet 650 mg (has no  administration in time range)    Or  acetaminophen (TYLENOL) suppository 650 mg (has no administration in time range)  ondansetron (ZOFRAN) tablet 4 mg (has no administration in time range)    Or  ondansetron (ZOFRAN) injection 4 mg (has no administration in time range)  enoxaparin (LOVENOX) injection 40 mg (40 mg Subcutaneous Given 10/03/18 1337)  cefTRIAXone (ROCEPHIN) 2 g in sodium chloride 0.9 % 100 mL IVPB (2 g Intravenous New Bag/Given 10/03/18 0837)  metroNIDAZOLE (FLAGYL) tablet 500 mg (500 mg Oral Given 10/03/18 1336)  feeding supplement (GLUCERNA SHAKE) (GLUCERNA SHAKE) liquid 237 mL (237 mLs Oral Not Given 10/03/18 1600)  feeding supplement (PRO-STAT SUGAR FREE 64) liquid 30 mL (30 mLs Oral Not Given 10/03/18 1600)  sodium chloride flush (NS) 0.9 % injection 3 mL (  3 mLs Intravenous Given 10/03/18 0213)  sodium chloride 0.9 % bolus 1,000 mL (1,000 mLs Intravenous New Bag/Given 10/03/18 0111)  vancomycin (VANCOCIN) 2,000 mg in sodium chloride 0.9 % 500 mL IVPB (0 mg Intravenous Stopped 10/03/18 0430)  piperacillin-tazobactam (ZOSYN) IVPB 3.375 g (0 g Intravenous Stopped 10/03/18 0300)     Initial Impression / Assessment and Plan / ED Course  I have reviewed the triage vital signs and the nursing notes.  Pertinent labs & imaging results that were available during my care of the patient were reviewed by me and considered in my medical decision making (see chart for details).        38 yo female with history of htn, hlpd, DM, presents with concern for altered mental status, somnolence.  No focal findings on history or exam and doubt CVA. CT head without abnormalities.  Labs show mild AKI, no other significant changes.  Suspect encephalopathy secondary to polypharmacy and specifically recent increase in zanaflex.  Also noted to have diabetic wound infection with surrounding cellulitis and given vanc/zosyn. Will admit for continued care.   Final Clinical Impressions(s) / ED Diagnoses   Final  diagnoses:  Somnolence  Adverse effect of drug, initial encounter  Acute kidney injury ([place])  Cellulitis of left lower extremity    ED Discharge Orders    None       Gareth Morgan, MD 10/03/18 2105

## 2018-10-04 DIAGNOSIS — G9340 Encephalopathy, unspecified: Secondary | ICD-10-CM | POA: Diagnosis not present

## 2018-10-04 LAB — CBC WITH DIFFERENTIAL/PLATELET
Abs Immature Granulocytes: 0.01 10*3/uL (ref 0.00–0.07)
Basophils Absolute: 0 10*3/uL (ref 0.0–0.1)
Basophils Relative: 0 %
Eosinophils Absolute: 0.2 10*3/uL (ref 0.0–0.5)
Eosinophils Relative: 4 %
HCT: 29 % — ABNORMAL LOW (ref 36.0–46.0)
Hemoglobin: 9.4 g/dL — ABNORMAL LOW (ref 12.0–15.0)
Immature Granulocytes: 0 %
Lymphocytes Relative: 36 %
Lymphs Abs: 2 10*3/uL (ref 0.7–4.0)
MCH: 27.8 pg (ref 26.0–34.0)
MCHC: 32.4 g/dL (ref 30.0–36.0)
MCV: 85.8 fL (ref 80.0–100.0)
MONO ABS: 0.5 10*3/uL (ref 0.1–1.0)
MONOS PCT: 9 %
Neutro Abs: 2.9 10*3/uL (ref 1.7–7.7)
Neutrophils Relative %: 51 %
Platelets: 193 10*3/uL (ref 150–400)
RBC: 3.38 MIL/uL — AB (ref 3.87–5.11)
RDW: 14.6 % (ref 11.5–15.5)
WBC: 5.6 10*3/uL (ref 4.0–10.5)
nRBC: 0 % (ref 0.0–0.2)

## 2018-10-04 LAB — COMPREHENSIVE METABOLIC PANEL
ALT: 20 U/L (ref 0–44)
AST: 24 U/L (ref 15–41)
Albumin: 3.1 g/dL — ABNORMAL LOW (ref 3.5–5.0)
Alkaline Phosphatase: 69 U/L (ref 38–126)
Anion gap: 8 (ref 5–15)
BUN: 17 mg/dL (ref 6–20)
CO2: 25 mmol/L (ref 22–32)
Calcium: 9.1 mg/dL (ref 8.9–10.3)
Chloride: 105 mmol/L (ref 98–111)
Creatinine, Ser: 0.84 mg/dL (ref 0.44–1.00)
GFR calc Af Amer: >60 mL/min (ref >60)
GFR calc non Af Amer: >60 mL/min (ref >60)
GLUCOSE: 140 mg/dL — AB (ref 70–99)
Potassium: 3.7 mmol/L (ref 3.5–5.1)
Sodium: 138 mmol/L (ref 135–145)
Total Bilirubin: 0.5 mg/dL (ref 0.3–1.2)
Total Protein: 7.3 g/dL (ref 6.5–8.1)

## 2018-10-04 LAB — GLUCOSE, CAPILLARY
Glucose-Capillary: 181 mg/dL — ABNORMAL HIGH (ref 70–99)
Glucose-Capillary: 147 mg/dL — ABNORMAL HIGH (ref 70–99)

## 2018-10-04 MED ORDER — ALBUTEROL SULFATE HFA 108 (90 BASE) MCG/ACT IN AERS: 1.0000 | INHALATION_SPRAY | Freq: Four times a day (QID) | RESPIRATORY_TRACT | 3 refills | Status: DC | PRN

## 2018-10-04 MED ORDER — DOXYCYCLINE HYCLATE 100 MG PO CAPS: 100.0000 mg | ORAL_CAPSULE | Freq: Two times a day (BID) | ORAL | 0 refills | Status: DC

## 2018-10-04 MED ORDER — SODIUM CHLORIDE 0.9 % IV SOLN: 100.0000 mg | Freq: Two times a day (BID) | INTRAVENOUS | 0 refills | Status: DC

## 2018-10-04 NOTE — Progress Notes (Signed)
Attempted to call [facility] nurse for assessment of Lt great toe wound for imminent discharge today x 2.  No response.  MD aware.

## 2018-10-04 NOTE — Discharge Instructions (Signed)
Accidental Drug Poisoning, Adult Accidental drug poisoning happens when a person accidentally takes too much of a substance, such as a prescription medicine, an over-the-counter medicine, a vitamin, a supplement, or an illegal drug. The effects of drug poisoning can be mild, dangerous, or even deadly. What are the causes? This condition is caused by taking too much of a medicine, illegal drug, or other substance. It often results from:  Lack of knowledge about a substance.  Using more than one substance at the same time.  An error made by the health care provider who prescribed the substance.  An error made by the pharmacist who filled the prescription.  A lapse in memory, such as forgetting that you have already taken a dose of the medicine.  Suddenly using a substance after a long period of not using it. The following substances and medicines are more likely to cause an accidental drug poisoning:  Medicines that treat mental problems (psychotropic medicines).  Pain medicines.  Cocaine.  Heroin.  Multivitamins that contain iron.  Over-the-counter cold and cough medicines. What increases the risk? This condition is more likely to occur in:  Elderly adults. Elderly adults are at risk because they may: ? Be taking many different medicines. ? Have difficulty reading labels. ? Forget when they last took their medicine.  People who use illegal drugs.  People who drink alcohol while using illegal drugs or certain medicines.  People with certain mental health conditions. What are the signs or symptoms? Symptoms of this condition depend on the substance and the amount that was taken. Common symptoms include:  Behavior changes, such as confusion.  Sleepiness.  Weakness.  Slowed breathing.  Nausea and vomiting.  Seizures.  Very large or small eye pupil size. A drug poisoning can cause a very serious condition in which your blood pressure drops to a low level (shock).  Symptoms of shock include:  Cold and clammy skin.  Pale skin.  Blue lips.  Very slow breathing.  Extreme sleepiness.  Severe confusion.  Dizziness or fainting. How is this diagnosed? This condition is diagnosed based on:  Your symptoms. You will be asked about the substances you took and when you took them.  A physical exam. You may also have other tests, including:  Urine tests.  Blood tests.  An electrocardiogram (ECG). How is this treated? This condition may need to be treated right away at the hospital. Treatment may involve:  Getting fluids and electrolytes through an IV.  Having a breathing tube inserted in your airway (endotracheal tube) to help you breathe.  Taking medicines. These may include medicines that: ? Absorb any substance that is in your digestive system. ? Block or reverse the effect of the substance that caused the drug poisoning.  Having your blood filtered through an artificial kidney machine (hemodialysis).  Ongoing counseling and mental health support. This may be provided if you used an illegal drug. Follow these instructions at home: Medicines   Take over-the-counter and prescription medicines only as told by your health care provider.  Before taking a new medicine, ask your health care provider whether the medicine: ? May cause side effects. ? Might react with other medicines.  Keep a list of all the medicines that you take, including over-the-counter medicines, vitamins, supplements, and herbs. Bring this list with you to all of your medical visits. General instructions   Drink enough fluid to keep your urine pale yellow.  If you are working with a Social worker or Education officer, community, make  sure to follow his or her instructions.  Do not drink alcohol if: ? Your health care provider tells you not to drink. ? You are pregnant, may be pregnant, or are planning to become pregnant.  If you drink alcohol, limit how much you  have: ? 0-1 drink a day for women. ? 0-2 drinks a day for men.  Be aware of how much alcohol is in your drink. In the [place] one drink equals one typical bottle of beer (12 oz), one-half glass of wine (5 oz), or one shot of hard liquor (1 oz).  Keep all follow-up visits as told by your health care provider. This is important. How is this prevented?   Get help if you are struggling with: ? Alcohol or drug use. ? Depression or another mental health problem.  Keep the phone number of your local poison control center near your phone or on your cell phone. The hotline of the [employer] is (800548-198-6742.  Store all medicines in safety containers that are out of the reach of children.  Read the drug inserts that come with your medicines.  Create a system for taking your medicine, such as a pillbox, that will help you avoid taking too much of the medicine.  Do not drink alcohol while taking medicines unless your health care provider approves.  Do not use illegal drugs.  Do not take medicines that are not prescribed for you. Contact a health care provider if:  Your symptoms return.  You develop new symptoms or side effects after taking a medicine.  You have questions about possible drug poisoning. Call your local poison control center at (800) 848-677-9383. Get help right away if:  You think that you or someone else may have taken too much of a substance.  You or someone else is having symptoms of drug poisoning. Summary  Accidental drug poisoning happens when a person accidentally takes too much of a substance, such as a prescription medicine, an over-the-counter medicine, a vitamin, a supplement, or an illegal drug.  The effects of drug poisoning can be mild, dangerous, or even deadly.  This condition is diagnosed based on your symptoms and a physical exam. You will be asked to tell your health care provider which substances you took and when you  took them.  This condition may need to be treated right away at the hospital. This information is not intended to replace advice given to you by your health care provider. Make sure you discuss any questions you have with your health care provider. Document Released: 09/28/2004 Document Revised: 06/16/2017 Document Reviewed: 06/16/2017 Elsevier Interactive Patient Education  2019 [employer]   Cellulitis, Adult  Cellulitis is a skin infection. The infected area is usually warm, red, swollen, and tender. This condition occurs most often in the arms and lower legs. The infection can travel to the muscles, blood, and underlying tissue and become serious. It is very important to get treated for this condition. What are the causes? Cellulitis is caused by bacteria. The bacteria enter through a break in the skin, such as a cut, burn, insect bite, open sore, or crack. What increases the risk? This condition is more likely to occur in people who:  Have a weak body defense system (immune system).  Have open wounds on the skin, such as cuts, burns, bites, and scrapes. Bacteria can enter the body through these open wounds.  Are older than 39 years of age.  Have diabetes.  Have a type of long-lasting (chronic) liver disease (cirrhosis) or kidney disease.  Are obese.  Have a skin condition such as: ? Itchy rash (eczema). ? Slow movement of blood in the veins (venous stasis). ? Fluid buildup below the skin (edema).  Have had radiation therapy.  Use IV drugs. What are the signs or symptoms? Symptoms of this condition include:  Redness, streaking, or spotting on the skin.  Swollen area of the skin.  Tenderness or pain when an area of the skin is touched.  Warm skin.  A fever.  Chills.  Blisters. How is this diagnosed? This condition is diagnosed based on a medical history and physical exam. You may also have tests, including:  Blood tests.  Imaging tests. How is this  treated? Treatment for this condition may include:  Medicines, such as antibiotic medicines or medicines to treat allergies (antihistamines).  Supportive care, such as rest and application of cold or warm cloths (compresses) to the skin.  Hospital care, if the condition is severe. The infection usually starts to get better within 1-2 days of treatment. Follow these instructions at home:  Medicines  Take over-the-counter and prescription medicines only as told by your health care provider.  If you were prescribed an antibiotic medicine, take it as told by your health care provider. Do not stop taking the antibiotic even if you start to feel better. General instructions  Drink enough fluid to keep your urine pale yellow.  Do not touch or rub the infected area.  Raise (elevate) the infected area above the level of your heart while you are sitting or lying down.  Apply warm or cold compresses to the affected area as told by your health care provider.  Keep all follow-up visits as told by your health care provider. This is important. These visits let your health care provider make sure a more serious infection is not developing. Contact a health care provider if:  You have a fever.  Your symptoms do not begin to improve within 1-2 days of starting treatment.  Your bone or joint underneath the infected area becomes painful after the skin has healed.  Your infection returns in the same area or another area.  You notice a swollen bump in the infected area.  You develop new symptoms.  You have a general ill feeling (malaise) with muscle aches and pains. Get help right away if:  Your symptoms get worse.  You feel very sleepy.  You develop vomiting or diarrhea that persists.  You notice red streaks coming from the infected area.  Your red area gets larger or turns dark in color. These symptoms may represent a serious problem that is an emergency. Do not wait to see if the  symptoms will go away. Get medical help right away. Call your local emergency services (911 in the [place] Do not drive yourself to the hospital. Summary  Cellulitis is a skin infection. This condition occurs most often in the arms and lower legs.  Treatment for this condition may include medicines, such as antibiotic medicines or antihistamines.  Take over-the-counter and prescription medicines only as told by your health care provider. If you were prescribed an antibiotic medicine, do not stop taking the antibiotic even if you start to feel better.  Contact a health care provider if your symptoms do not begin to improve within 1-2 days of starting treatment or your symptoms get worse.  Keep all follow-up visits as told by your health care provider. This is important.  These visits let your health care provider make sure that a more serious infection is not developing. This information is not intended to replace advice given to you by your health care provider. Make sure you discuss any questions you have with your health care provider. Document Released: 04/24/2005 Document Revised: 12/04/2017 Document Reviewed: 12/04/2017 Elsevier Interactive Patient Education  2019 [place], Adult Accidental drug poisoning happens when a person accidentally takes too much of a substance, such as a prescription medicine, an over-the-counter medicine, a vitamin, a supplement, or an illegal drug. The effects of drug poisoning can be mild, dangerous, or even deadly. What are the causes? This condition is caused by taking too much of a medicine, illegal drug, or other substance. It often results from:  Lack of knowledge about a substance.  Using more than one substance at the same time.  An error made by the health care provider who prescribed the substance.  An error made by the pharmacist who filled the prescription.  A lapse in memory, such as forgetting that you have  already taken a dose of the medicine.  Suddenly using a substance after a long period of not using it. The following substances and medicines are more likely to cause an accidental drug poisoning:  Medicines that treat mental problems (psychotropic medicines).  Pain medicines.  Cocaine.  Heroin.  Multivitamins that contain iron.  Over-the-counter cold and cough medicines. What increases the risk? This condition is more likely to occur in:  Elderly adults. Elderly adults are at risk because they may: ? Be taking many different medicines. ? Have difficulty reading labels. ? Forget when they last took their medicine.  People who use illegal drugs.  People who drink alcohol while using illegal drugs or certain medicines.  People with certain mental health conditions. What are the signs or symptoms? Symptoms of this condition depend on the substance and the amount that was taken. Common symptoms include:  Behavior changes, such as confusion.  Sleepiness.  Weakness.  Slowed breathing.  Nausea and vomiting.  Seizures.  Very large or small eye pupil size. A drug poisoning can cause a very serious condition in which your blood pressure drops to a low level (shock). Symptoms of shock include:  Cold and clammy skin.  Pale skin.  Blue lips.  Very slow breathing.  Extreme sleepiness.  Severe confusion.  Dizziness or fainting. How is this diagnosed? This condition is diagnosed based on:  Your symptoms. You will be asked about the substances you took and when you took them.  A physical exam. You may also have other tests, including:  Urine tests.  Blood tests.  An electrocardiogram (ECG). How is this treated? This condition may need to be treated right away at the hospital. Treatment may involve:  Getting fluids and electrolytes through an IV.  Having a breathing tube inserted in your airway (endotracheal tube) to help you breathe.  Taking medicines.  These may include medicines that: ? Absorb any substance that is in your digestive system. ? Block or reverse the effect of the substance that caused the drug poisoning.  Having your blood filtered through an artificial kidney machine (hemodialysis).  Ongoing counseling and mental health support. This may be provided if you used an illegal drug. Follow these instructions at home: Medicines   Take over-the-counter and prescription medicines only as told by your health care provider.  Before taking a new medicine, ask your health care provider whether the medicine: ?  May cause side effects. ? Might react with other medicines.  Keep a list of all the medicines that you take, including over-the-counter medicines, vitamins, supplements, and herbs. Bring this list with you to all of your medical visits. General instructions   Drink enough fluid to keep your urine pale yellow.  If you are working with a counselor or mental health professional, make sure to follow his or her instructions.  Do not drink alcohol if: ? Your health care provider tells you not to drink. ? You are pregnant, may be pregnant, or are planning to become pregnant.  If you drink alcohol, limit how much you have: ? 0-1 drink a day for women. ? 0-2 drinks a day for men.  Be aware of how much alcohol is in your drink. In the [place] one drink equals one typical bottle of beer (12 oz), one-half glass of wine (5 oz), or one shot of hard liquor (1 oz).  Keep all follow-up visits as told by your health care provider. This is important. How is this prevented?   Get help if you are struggling with: ? Alcohol or drug use. ? Depression or another mental health problem.  Keep the phone number of your local poison control center near your phone or on your cell phone. The hotline of the [employer] is (800(218)766-7518.  Store all medicines in safety containers that are out of the reach of  children.  Read the drug inserts that come with your medicines.  Create a system for taking your medicine, such as a pillbox, that will help you avoid taking too much of the medicine.  Do not drink alcohol while taking medicines unless your health care provider approves.  Do not use illegal drugs.  Do not take medicines that are not prescribed for you. Contact a health care provider if:  Your symptoms return.  You develop new symptoms or side effects after taking a medicine.  You have questions about possible drug poisoning. Call your local poison control center at (800) (707)436-8803. Get help right away if:  You think that you or someone else may have taken too much of a substance.  You or someone else is having symptoms of drug poisoning. Summary  Accidental drug poisoning happens when a person accidentally takes too much of a substance, such as a prescription medicine, an over-the-counter medicine, a vitamin, a supplement, or an illegal drug.  The effects of drug poisoning can be mild, dangerous, or even deadly.  This condition is diagnosed based on your symptoms and a physical exam. You will be asked to tell your health care provider which substances you took and when you took them.  This condition may need to be treated right away at the hospital. This information is not intended to replace advice given to you by your health care provider. Make sure you discuss any questions you have with your health care provider. Document Released: 09/28/2004 Document Revised: 06/16/2017 Document Reviewed: 06/16/2017 Elsevier Interactive Patient Education  2019 [employer]   Chronic Drug Toxicity  Drug toxicity is when a drug causes harmful and unwanted (adverse) effects in the body. It is called chronic drug toxicity when it develops after a person has taken a drug for a long period of time. With some drugs, there is only a small difference between the dosage that is needed to treat a  condition and the dosage that is harmful (narrow therapeutic range). However, any drug (including over-the-counter medicines)  can be toxic at high doses, and even normal doses of certain drugs can be toxic for some people. The effects of chronic drug toxicity can be mild, dangerous, or even deadly. What are the causes? Many things can cause drug toxicity. Most drugs are broken down (metabolized) by the liver and sent out of the body (excreted) by the kidneys. One way that chronic drug toxicity can happen is if your body changes the way that you metabolize a drug. This can happen, for example, if you weigh less than you did when you started taking a drug but you keep taking the same dose that you took at the heavier weight. What increases the risk? You may be more likely to develop chronic drug toxicity if you:  Have liver disease, kidney disease, or another medical condition.  Are taking more than one drug.  Are pregnant.  Are allergic to certain drugs.  Have genes that cause you to be more affected by (susceptible to) certain drugs.  Take a drug that has a narrow therapeutic range. Certain types of drugs are more likely than others to cause toxicity. This may be because the drug has a narrow therapeutic range. Drugs that are more likely to cause toxicity include:  Blood thinners.  Heart medicines.  Diabetes medicines.  Medicines to prevent or stop seizures.  Antipsychotics or antidepressants. What are the signs or symptoms? Symptoms of chronic drug toxicity depend on the drug and the amount that was taken. Symptoms may start suddenly or develop gradually over time. How is this diagnosed? This condition may be diagnosed based on your symptoms, a physical exam, and tests. These tests may include:  Blood tests to measure the amount of the drug in your blood or to check for signs of kidney or liver damage.  Urine tests.  Other tests to check for organ damage. Some drugs have known  side effects that may be signs of toxicity. Tell your health care provider about:  All drugs that you are taking, including over-the-counter medicines, vitamins, herbs, and supplements.  Whether you have ever had a reaction to a drug. How is this treated? Treatment for this condition may include:  Stopping the drug.  Lowering the dose of the drug.  Switching to a different drug. You may also need treatment to stop or reverse the effects of the toxicity. Those treatments depend on the drug that caused the toxicity, how severe the toxicity is, and which parts of your body are affected. Follow these instructions at home:  Take over-the-counter and prescription medicines only as told by your health care provider. Always ask your health care provider about the possible side effects of any new drug that you start taking.  Keep a list of all of the drugs that you take, including over-the-counter medicines. Take this list with you to all of your medical visits.  Read the drug inserts that come with your medicines. Doing that will help you know what symptoms to watch out for.  Keep all follow-up visits as told by your health care provider. This is important. Contact a health care provider if you:  Have symptoms that return.  Have any new signs or symptoms when you are taking medicines.  Notice any signs that your health care provider told you to watch for that indicate that you are taking too much of a medicine. Get help right away if you:  Have chest pain.  Have trouble breathing.  Lose consciousness. These symptoms may represent a serious problem  that is an emergency. Do not wait to see if the symptoms will go away. Get medical help right away. Call your local emergency services (911 in the [place] Do not drive yourself to the hospital.  Summary  Drug toxicity is when a drug causes harmful and unwanted (adverse) effects in the body. It is called chronic drug toxicity when it develops  after a person has taken a drug for a long period of time.  With some drugs, there is only a small difference between the dosage that is needed to treat a condition and the dosage that is harmful (narrow therapeutic range).  Certain risk factors may increase your chances of developing chronic drug toxicity.  Make sure you understand how to take your medicines correctly. This information is not intended to replace advice given to you by your health care provider. Make sure you discuss any questions you have with your health care provider. Document Released: 07/15/2005 Document Revised: 06/10/2017 Document Reviewed: 06/10/2017 Elsevier Interactive Patient Education  2019 [employer].

## 2018-10-04 NOTE — Discharge Summary (Signed)
Physician Discharge Summary  VENORA KAUTZMAN OZH:[id number] DOB: 08-Sep-1980 DOA: 10/02/2018  PCP: Ladell Pier, MD  Admit date: 10/02/2018 Discharge date: 10/04/2018  Admitted From: Home Disposition: Home Recommendations for Outpatient Follow-up:  1. Follow up with PCP in 1-2 weeks 2. Please obtain BMP/CBC in one week 3. Please follow up with Dr. Sharol Given 4. Follow-up with [facility]  Home Health: None Equipment/Devices: None Discharge Condition stable CODE STATUS full code Diet recommendation: Cardiac modified carb  Brief/Interim Summary: 38 y.o. female with medical history significant of DM2, HTN, chronic pain syndrome.  Patient brought to ED by family members due to confusion, lethargy, falling asleep during conversations for about the past 1 week or so.  Reportedly she was recently taken off of belbuca and MSIR and instead put on zanaflex (09/17/2018).  She is also on gabapentin.  Other issues include a L chronic toe wound present since November.  She has no known malignancy, but due to concern for possible spine lesions, she had a NM bone scan on Friday last week.   ED Course: Put on zosyn / vanc for the toe cellulitis / ulcer.  UDS pending.  Afebrile.  No WBC.  Does have mild AKI with creat 1.2 and BUN 50 up from 18 and 0.83 baseline.  ABG also unremarkable.  CT head pending.  Patient seems to be more alert when I saw her coming back from CT.    Discharge Diagnoses:  Principal Problem:   Acute encephalopathy Active Problems:   Diabetes type 2, uncontrolled (HCC)   Essential hypertension, benign   Chronic pain syndrome   Toe infection   Diabetic foot infection ([place])   AKI (acute kidney injury) ([place])   #1 acute encephalopathy thought to be secondary to medication related patient takes Zanaflex Neurontin MSIR at home.  Upon admission these medications were stopped.  Upon further discussion with the patient she reported that she had an appointment to see a  pain management doctor Wednesday 2 days prior to admission to the hospital at that time MSIR dose was increased to 30 mg 3 times a day.  This probably caused her to have concurrent increased confusion slurring speech and increased lethargy and falling asleep during conversation.  I have discussed with the patient she does have all this plus prescriptions at home and she is in chronic pain and she is going to go home and take these pain medications.  However I have discussed with her to cut down the dose of MSIR to 30 mg twice a day instead of 3 times a day.  CT of the head was normal.  No evidence of active infection was found white count was normal.  Patient reports she is not taking Belbuca anymore.  #2 left great toe ulcer chronic no evidence of active infection no evidence of purulent drainage x-ray shows no evidence of osteomyelitis other than surrounding the tissue.  She was treated with Vanco Rocephin and Flagyl in the hospital.  MRSA PCR negative in the past.  I will discharge her on doxycycline 100 mg twice a day.  She reports she has help at home to change the dressings and she will follow-up with her PCP who is directing care for her foot and she will follow-up with Dr. Sharol Given who is seen her in the past.  #3 mild AKI resolved with IV fluids and holding ACE inhibitor hydrochlorothiazide and NSAIDs.  #4 hypertension restart antihypertensives upon discharge.    Nutrition Problem: Increased nutrient needs Etiology:  wound healing    Signs/Symptoms: estimated needs     Interventions: Glucerna shake, Prostat  Estimated body mass index is 52.64 kg/m as calculated from the following:   Height as of this encounter: 4' 8.5" (1.435 m).   Weight as of this encounter: 108.4 kg.  Discharge Instructions  Discharge Instructions    Call MD for:  difficulty breathing, headache or visual disturbances   Complete by:  As directed    Call MD for:  persistant dizziness or light-headedness    Complete by:  As directed    Call MD for:  persistant nausea and vomiting   Complete by:  As directed    Diet - low sodium heart healthy   Complete by:  As directed    Increase activity slowly   Complete by:  As directed      Allergies as of 10/04/2018      Reactions   Hydrocodone Itching   Note: benadryl does NOT help   Metformin And Related Other (See Comments)   Causes blisters on skin   Norvasc [amlodipine Besylate] Swelling   Ankle swelling   Ultrasound Gel Rash, Other (See Comments)   Patient gets a red rash with use of ultrasound gel/ burns skin      Medication List    STOP taking these medications   ALPRAZolam 1 MG tablet Commonly known as:  XANAX   Belbuca 450 MCG Film Generic drug:  Buprenorphine HCl   Byetta 5 MCG Pen 5 MCG/0.02ML Sopn injection Generic drug:  exenatide   diclofenac 75 MG EC tablet Commonly known as:  VOLTAREN   LORazepam 1 MG tablet Commonly known as:  ATIVAN     TAKE these medications   Accu-Chek Aviva Plus w/Device Kit 1 Device by Does not apply route 3 (three) times daily after meals. ICD 10 E11.9   accu-chek softclix lancets 1 each by Other route 3 (three) times daily. ICD 10 E11.9   Accu-Chek Softclix Lancets lancets 1 each by Other route 3 (three) times daily. ICD 10 E11.9   albuterol 108 (90 Base) MCG/ACT inhaler Commonly known as:  PROVENTIL HFA;VENTOLIN HFA Inhale 1 puff into the lungs every 6 (six) hours as needed for wheezing or shortness of breath.   amitriptyline 25 MG tablet Commonly known as:  ELAVIL Take 1 tablet (25 mg total) by mouth at bedtime.   atorvastatin 10 MG tablet Commonly known as:  LIPITOR Take 1 tablet (10 mg total) by mouth daily.   carvedilol 3.125 MG tablet Commonly known as:  COREG Take 1 tablet (3.125 mg total) by mouth 2 (two) times daily with a meal.   doxycycline 100 mg in sodium chloride 0.9 % 250 mL Inject 100 mg into the vein every 12 (twelve) hours.   escitalopram 20 MG  tablet Commonly known as:  LEXAPRO Take 1 tablet (20 mg total) by mouth daily.   fluticasone 50 MCG/ACT nasal spray Commonly known as:  FLONASE Place 2 sprays into both nostrils daily.   FreeStyle Libre Sensor System Misc Change sensor Q 2 wks   gabapentin 100 MG capsule Commonly known as:  NEURONTIN TAKE 3 CAPSULES BY MOUTH THREE TIMES DAILY   glucose blood test strip Commonly known as:  Accu-Chek Aviva Plus 1 each by Other route 3 (three) times daily. ICD 10 E11.9   hydrochlorothiazide 25 MG tablet Commonly known as:  HYDRODIURIL Take 1 tablet (25 mg total) by mouth daily.   insulin aspart 100 UNIT/ML FlexPen Commonly known as:  NovoLOG FlexPen Inject 24 Units into the skin 2 (two) times daily before a meal. 15 units subcut with the two largest meals of the day   Insulin Glargine 100 UNIT/ML Solostar Pen Commonly known as:  Lantus SoloStar Inject 38 Units into the skin daily.   Insulin Pen Needle 31G X 8 MM Misc Commonly known as:  B-D ULTRAFINE III SHORT PEN 1 application by Does not apply route daily.   lisinopril 40 MG tablet Commonly known as:  PRINIVIL,ZESTRIL TAKE 1 TABLET BY MOUTH DAILY   meloxicam 15 MG tablet Commonly known as:  MOBIC Take 15 mg by mouth daily.   morphine 30 MG tablet Commonly known as:  MSIR Take 30 mg by mouth 3 (three) times daily as needed (pain).   pantoprazole 40 MG tablet Commonly known as:  PROTONIX Take 1 tablet (40 mg total) by mouth daily.   sitaGLIPtin 50 MG tablet Commonly known as:  Januvia Take 1 tablet (50 mg total) by mouth daily.   SUMAtriptan 25 MG tablet Commonly known as:  Imitrex Take 1-2 tabs at start of headache. May repeat in 2 hrs if no relief. Max 4 tabs/24 hr. What changed:    how much to take  how to take this  when to take this  reasons to take this   tiZANidine 4 MG tablet Commonly known as:  ZANAFLEX TAKE 1 TABLET(4 MG) BY MOUTH EVERY 8 HOURS AS NEEDED FOR MUSCLE SPASMS What changed:   Another medication with the same name was removed. Continue taking this medication, and follow the directions you see here.   triamcinolone cream 0.1 % Commonly known as:  KENALOG Apply 1 application topically 2 (two) times daily.      Follow-up Information    Ladell Pier, MD Follow up.   Specialty:  Internal Medicine Contact information: [place] [address] [phone]        Newt Minion, MD Follow up.   Specialty:  Orthopedic Surgery Contact information: [address] [place] [address] [phone]          Allergies  Allergen Reactions  . Hydrocodone Itching    Note: benadryl does NOT help  . Metformin And Related Other (See Comments)    Causes blisters on skin  . Norvasc [Amlodipine Besylate] Swelling    Ankle swelling  . Ultrasound Gel Rash and Other (See Comments)    Patient gets a red rash with use of ultrasound gel/ burns skin    Consultations:  None   Procedures/Studies: Ct Head Wo Contrast  Result Date: 10/03/2018 CLINICAL DATA:  Headache, chronic, neuro deficit EXAM: CT HEAD WITHOUT CONTRAST TECHNIQUE: Contiguous axial images were obtained from the base of the skull through the vertex without intravenous contrast. COMPARISON:  None. FINDINGS: Brain: Brain volume is normal for age. No intracranial hemorrhage, mass effect, or midline shift. No hydrocephalus. Narrowing of the foramen magnum is seen on prior cervical spine MRI. No evidence of territorial infarct or acute ischemia. No extra-axial or intracranial fluid collection. Vascular: No hyperdense vessel. Skull: There are no acute or suspicious osseous abnormalities. Chronic flattening of left occipital bone. Sinuses/Orbits: Paranasal sinuses and mastoid air cells are clear. The visualized orbits are unremarkable. Other: None. IMPRESSION: No acute intracranial abnormality. Electronically Signed   By: Keith Rake M.D.   On: 10/03/2018 02:08   Nm Bone Scan  Whole Body  Result Date: 09/25/2018 CLINICAL DATA:  Low back and left hip pain. Rib fracture suspected.  No known malignancy. EXAM: NUCLEAR MEDICINE WHOLE BODY BONE SCAN TECHNIQUE: Whole body anterior and posterior images were obtained approximately 3 hours after intravenous injection of radiopharmaceutical. RADIOPHARMACEUTICALS:  21.2 mCi Technetium-48mMDP IV COMPARISON:  Lumbar spine radiographs dated 08/31/2018, bilateral hip radiographs dated 08/31/2018, thoracic spine MR dated 02/25/2017. Pelvis MR dated 11/06/2017. FINDINGS: Diffusely increased, patchy uptake throughout the lumbar spine and extending upward into the thoracic spine to the level of the midthoracic spine. This continues into the upper thoracic spine on the left. This is also involving the sacrum and sacroiliac joints bilaterally. There are similar changes involving the sternum and both sternomanubrial joints. There is increased tracer uptake in both shoulders, proximal right humerus, right elbow, right wrist, left knee, left foot and left great toe. There are lesser amounts of increased tracer uptake in the right hip and right knee. The majority of the left arm is not included due to the width of the patient. There is also mild diffusely increased tracer uptake in the skull and face. Focally increased tracer uptake is noted in the anterior aspect of the left 5th rib. Normal renal and bladder activity. IMPRESSION: Extensive abnormal tracer uptake throughout large portions of the bony skeleton, as described above. The majority of this is most likely due to degenerative changes and stress reactions associated with the large size of the patient. The uptake in the anterior left 5th rib may be due to a fracture or costochondritis. The uptake in the skull may be metabolic in nature or due to a proliferative process such as Paget's disease. The uptake in the proximal right humeral shaft is nonspecific. Right humerus radiographs are recommended for  further evaluation. Skull radiographs may also be beneficial in evaluating the skull and face uptake. Electronically Signed   By: SClaudie ReveringM.D.   On: 09/25/2018 17:58   Dg Foot Complete Left  Result Date: 10/03/2018 CLINICAL DATA:  Chronic foot wound EXAM: LEFT FOOT - COMPLETE 3+ VIEW COMPARISON:  07/11/2018 FINDINGS: Similar appearance of the foot with prominent soft tissue swelling and/or edema more so along the dorsum of the forefoot. No acute fracture or bone destruction. Accessory ossicles are seen adjacent to the tarsal navicular. Small bony exostosis is seen along the lateral aspect of the fifth metatarsal. Calcaneal enthesopathy is seen along the dorsal aspect. IMPRESSION: Soft tissue swelling without acute osseous abnormality. No radiographic evidence of acute osteomyelitis. Electronically Signed   By: DAshley RoyaltyM.D.   On: 10/03/2018 01:35    (Echo, Carotid, EGD, Colonoscopy, ERCP)    Subjective:   Discharge Exam: Vitals:   10/04/18 0404 10/04/18 0837  BP: (!) 141/74 (!) 175/83  Pulse: 82 88  Resp: 17 18  Temp: 98 F (36.7 C) 98.9 F (37.2 C)  SpO2: 99% 99%   Vitals:   10/03/18 2124 10/03/18 2303 10/04/18 0404 10/04/18 0837  BP: (!) 173/98 (!) 100/56 (!) 141/74 (!) 175/83  Pulse: 82 68 82 88  Resp: 18 18 17 18   Temp:  98.6 F (37 C) 98 F (36.7 C) 98.9 F (37.2 C)  TempSrc:  Oral Oral Oral  SpO2: 99% 99% 99% 99%  Weight:      Height:        General: Pt is alert, awake, not in acute distress Cardiovascular: RRR, S1/S2 +, no rubs, no gallops Respiratory: CTA bilaterally, no wheezing, no rhonchi Abdominal: Soft, NT, ND, bowel sounds + Extremities: Left great toe edema no evidence of erythema no evidence of drainage the dressings  was dry.    The results of significant diagnostics from this hospitalization (including imaging, microbiology, ancillary and laboratory) are listed below for reference.     Microbiology: Recent Results (from the past 240 hour(s))   Blood culture (routine x 2)     Status: None (Preliminary result)   Collection Time: 10/03/18  1:08 AM  Result Value Ref Range Status   Specimen Description BLOOD LEFT ANTECUBITAL  Final   Special Requests   Final    BOTTLES DRAWN AEROBIC AND ANAEROBIC Blood Culture adequate volume   Culture   Final    NO GROWTH 1 DAY Performed at [facility], [address], [place], [place] [address]    Report Status PENDING  Incomplete  Blood culture (routine x 2)     Status: None (Preliminary result)   Collection Time: 10/03/18  1:37 AM  Result Value Ref Range Status   Specimen Description BLOOD RIGHT HAND  Final   Special Requests   Final    BOTTLES DRAWN AEROBIC AND ANAEROBIC Blood Culture results may not be optimal due to an excessive volume of blood received in culture bottles   Culture   Final    NO GROWTH 1 DAY Performed at [facility], [place] [address] [place], [place] [address]    Report Status PENDING  Incomplete     Labs: BNP (last 3 results) No results for input(s): BNP in the last 8760 hours. Basic Metabolic Panel: Recent Labs  Lab 10/02/18 1802 10/03/18 0131 10/03/18 0201  NA 136 138 135  K 4.5 4.5 4.6  CL 103  --  105  CO2 25  --  22  GLUCOSE 247*  --  259*  BUN 50*  --  42*  CREATININE 1.20*  --  1.01*  CALCIUM 9.6  --  9.5   Liver Function Tests: Recent Labs  Lab 10/02/18 1802  AST 19  ALT 21  ALKPHOS 89  BILITOT 0.8  PROT 7.9  ALBUMIN 3.5   No results for input(s): LIPASE, AMYLASE in the last 168 hours. Recent Labs  Lab 10/03/18 0108  AMMONIA 33   CBC: Recent Labs  Lab 10/02/18 1802 10/03/18 0131  WBC 8.7  --   HGB 9.7* 12.6  HCT 31.1* 37.0  MCV 87.1  --   PLT 228  --    Cardiac Enzymes: No results for input(s): CKTOTAL, CKMB, CKMBINDEX, TROPONINI in the last 168 hours. BNP: Invalid input(s): POCBNP CBG: Recent Labs  Lab 10/03/18 0817 10/03/18 1144 10/03/18 1656 10/03/18 2153 10/04/18 0811  GLUCAP 214* 222* 166* 192*  181*   D-Dimer No results for input(s): DDIMER in the last 72 hours. Hgb A1c No results for input(s): HGBA1C in the last 72 hours. Lipid Profile No results for input(s): CHOL, HDL, LDLCALC, TRIG, CHOLHDL, LDLDIRECT in the last 72 hours. Thyroid function studies No results for input(s): TSH, T4TOTAL, T3FREE, THYROIDAB in the last 72 hours.  Invalid input(s): FREET3 Anemia work up No results for input(s): VITAMINB12, FOLATE, FERRITIN, TIBC, IRON, RETICCTPCT in the last 72 hours. Urinalysis    Component Value Date/Time   COLORURINE YELLOW 01/12/2011 2009   APPEARANCEUR CLOUDY (A) 01/12/2011 2009   LABSPEC 1.039 (H) 01/12/2011 2009   PHURINE 6.0 01/12/2011 2009   GLUCOSEU >1000 (A) 01/12/2011 2009   HGBUR LARGE (A) 01/12/2011 2009   HGBUR negative 10/04/2010 [place] NEGATIVE 01/12/2011 2009   KETONESUR 15 (A) 01/12/2011 2009   PROTEINUR 100 (A) 01/12/2011 2009  UROBILINOGEN 0.2 01/12/2011 2009   NITRITE POSITIVE (A) 01/12/2011 2009   LEUKOCYTESUR NEGATIVE 01/12/2011 2009   Sepsis Labs Invalid input(s): PROCALCITONIN,  WBC,  LACTICIDVEN Microbiology Recent Results (from the past 240 hour(s))  Blood culture (routine x 2)     Status: None (Preliminary result)   Collection Time: 10/03/18  1:08 AM  Result Value Ref Range Status   Specimen Description BLOOD LEFT ANTECUBITAL  Final   Special Requests   Final    BOTTLES DRAWN AEROBIC AND ANAEROBIC Blood Culture adequate volume   Culture   Final    NO GROWTH 1 DAY Performed at [facility], [place] [address], [place], [place] [address]    Report Status PENDING  Incomplete  Blood culture (routine x 2)     Status: None (Preliminary result)   Collection Time: 10/03/18  1:37 AM  Result Value Ref Range Status   Specimen Description BLOOD RIGHT HAND  Final   Special Requests   Final    BOTTLES DRAWN AEROBIC AND ANAEROBIC Blood Culture results may not be optimal due to an excessive volume of blood received in culture  bottles   Culture   Final    NO GROWTH 1 DAY Performed at [facility], [place] [address], [place], [place] [address]    Report Status PENDING  Incomplete     Time coordinating discharge:  33 minutes  SIGNED:   [name], MD  [facility] 10/04/2018, 11:58 AM Pager   If 7PM-7AM, please contact night-coverage [email or web address] Password [email or web address]

## 2018-10-04 NOTE — Progress Notes (Signed)
Spoke w patient at bedside. She states that she cares for her toe wound herself at home. She denies needing home health services for this. She states that she has prescription coverage through her medicaid. She is independent for ADLs. No CM needs identified at this time.

## 2018-10-04 NOTE — Progress Notes (Signed)
Discharge instructions reviewed with patient.  These included, but were not limited to, the following:  Discharge medications, prescriptions, follow-up appointments, when to call the MD, how to take antibiotics, etc.  It was advised that if symptoms persist or worsen, that she should call her primary physician.  Patient stated that she understood discharge instructions and information, and this was ascertained via "teach-back" method of education.  Discharged to private residence with husband.  Escorted to exit via wheelchair by nurse tech.

## 2018-10-06 ENCOUNTER — Telehealth: Payer: Self-pay | Admitting: Internal Medicine

## 2018-10-06 NOTE — Telephone Encounter (Signed)
-----   Message from Ena Dawley sent at 10/06/2018 11:56 AM EDT ----- Kermit Balo Morning   [place]   Contacted the patient today and patient respond was   Spoke with patient, unable to schedule at the moment but will call back to schedule appt. The note is in the referral notes.  ----- Message ----- From: Ladell Pier, MD Sent: 10/06/2018   7:58 AM EDT To: Ena Dawley  Please try to get pt in with a podiatrist ASAP.

## 2018-10-08 LAB — CULTURE, BLOOD (ROUTINE X 2)
Culture: NO GROWTH
Culture: NO GROWTH
SPECIAL REQUESTS: ADEQUATE

## 2018-10-12 ENCOUNTER — Encounter (INDEPENDENT_AMBULATORY_CARE_PROVIDER_SITE_OTHER): Payer: Self-pay | Admitting: Specialist

## 2018-10-12 ENCOUNTER — Ambulatory Visit (INDEPENDENT_AMBULATORY_CARE_PROVIDER_SITE_OTHER): Payer: Medicaid Other | Admitting: Specialist

## 2018-10-12 ENCOUNTER — Other Ambulatory Visit: Payer: Self-pay

## 2018-10-12 ENCOUNTER — Ambulatory Visit (INDEPENDENT_AMBULATORY_CARE_PROVIDER_SITE_OTHER): Payer: Medicaid Other

## 2018-10-12 VITALS — BP 150/80 | HR 77 | Ht <= 58 in | Wt 240.0 lb

## 2018-10-12 DIAGNOSIS — M4322 Fusion of spine, cervical region: Secondary | ICD-10-CM

## 2018-10-12 DIAGNOSIS — R52 Pain, unspecified: Secondary | ICD-10-CM

## 2018-10-12 DIAGNOSIS — M542 Cervicalgia: Secondary | ICD-10-CM

## 2018-10-12 DIAGNOSIS — G8929 Other chronic pain: Secondary | ICD-10-CM | POA: Diagnosis not present

## 2018-10-12 DIAGNOSIS — M25512 Pain in left shoulder: Secondary | ICD-10-CM

## 2018-10-12 DIAGNOSIS — M898X6 Other specified disorders of bone, lower leg: Secondary | ICD-10-CM

## 2018-10-12 NOTE — Patient Instructions (Addendum)
Avoid overhead lifting and overhead use of the arms. Do not lift greater than 10 lbs. Tylenol ES one every 6-8 hours for pain and inflamation.  Continue with PT for another 2-3 weeks, then a home exercise program. Avoid overhead lifting and overhead use of the arms. Do not lift greater than 5 lbs. Adjust head rest in vehicle to prevent hyperextension if rear ended. Take extra precautions to avoid falling.  Lab work for [employer] and CPR ordered.

## 2018-10-12 NOTE — Progress Notes (Signed)
Office Visit Note   Patient: Briana Williams           Date of Birth: 1980-09-18           MRN: [id number] Visit Date: 10/12/2018              Requested by: Ladell Pier, MD [address] [place],  [address] PCP: Ladell Pier, MD   Assessment & Plan: Visit Diagnoses:  1. Cervicalgia   2. Cervical vertebral fusion   3. Chronic left shoulder pain   4. Bone pain of lower leg   5. Pain of left side of body     Plan: Avoid overhead lifting and overhead use of the arms. Do not lift greater than 10 lbs. Tylenol ES one every 6-8 hours for pain and inflamation.  Continue with PT for another 2-3 weeks, then a home exercise program. Avoid overhead lifting and overhead use of the arms. Do not lift greater than 5 lbs. Adjust head rest in vehicle to prevent hyperextension if rear ended. Take extra precautions to avoid falling.  Lab work for [employer] and CPR ordered  Follow-Up Instructions: Return in about 4 weeks (around 11/09/2018).   Orders:  Orders Placed This Encounter  Procedures  . XR Shoulder Left  . XR Cervical Spine 2 or 3 views  . Protein Electrophoresis, (serum)  . Ambulatory referral to Neurology   No orders of the defined types were placed in this encounter.     Procedures: No procedures performed   Clinical Data: No additional findings.   Subjective: Chief Complaint  Patient presents with  . Lower Back - Follow-up    HPI  Review of Systems   Objective: Vital Signs: BP (!) 150/80 (BP Location: Left Arm, Patient Position: Sitting)   Pulse 77   Ht 4' 8.5" (1.435 m)   Wt 240 lb (108.9 kg)   BMI 52.86 kg/m   Physical Exam  Ortho Exam  Specialty Comments:  No specialty comments available.  Imaging: No results found.   PMFS History: Patient Active Problem List   Diagnosis Date Noted  . Acute encephalopathy 10/03/2018  . AKI (acute kidney injury) ([place]) 10/03/2018  . Diabetic foot ulcer ([place]) 09/16/2018  . Diabetic  foot infection ([place]) 07/12/2018  . Morbid obesity ([place]) 07/12/2018  . Toe infection 07/11/2018  . Muscle spasm of back 04/24/2017  . Osteoarthritis of both hips 01/15/2017  . Recurrent headache 01/06/2017  . Irregular menstrual cycle 05/30/2016  . Chronic pain syndrome 09/21/2015  . HYPERTRIGLYCERIDEMIA 05/02/2009  . 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 Insomnia 05/01/2009  . SCOLIOSIS 05/18/2008  . Diabetes type 2, uncontrolled ([place]) 04/18/2008  . DEPRESSION 04/18/2008  . Essential hypertension, benign 04/18/2008   Past Medical History:  Diagnosis Date  . Arthritis   . GERD (gastroesophageal reflux disease)   . Headache    migraine 2 weeks ago  . Hyperlipidemia   . Hypertension associated with diabetes ([place])   . Renal disorder    has both kidneys, compacted and 'squashed together"  . Scoliosis   . Sleep apnea    in the midst of being tested for it.   waiting on scheduling   . Type 2 diabetes mellitus treated with insulin (HCC)    dx 2000    Family History  Problem Relation Age of Onset  . Cancer Maternal Grandmother        mandibular cancer   . Diabetes Mother   . Heart failure Mother   .  CAD Mother        triple CABG  . Diabetes Maternal Aunt   . Stroke Father     Past Surgical History:  Procedure Laterality Date  . BACK SURGERY    . CERVICAL FUSION    . frontal closed sutures to forehead    . Harrington Rod placement    . HIP SURGERY     staph infection  . LUMBAR FUSION    . RADIOLOGY WITH ANESTHESIA N/A 02/25/2017   Procedure: RADIOLOGY WITH ANESTHESIA/MRI THORACIC SPINE AND LEFT SHOULDER WITHOUT CONTRAST.;  Surgeon: Radiologist, Medication, MD;  Location: [facility];  Service: Radiology;  Laterality: N/A;   Social History   Occupational History    Employer: UNEMPLOYED  Tobacco Use  . Smoking status: Never Smoker  . Smokeless tobacco: Never Used  Substance and Sexual Activity  . Alcohol use: No  . Drug use: No  . Sexual activity: Yes    Birth  control/protection: Other-see comments    Comment: unable to have children d/t scoliosis

## 2018-10-14 LAB — PROTEIN ELECTROPHORESIS, SERUM
Albumin ELP: 4 g/dL (ref 3.8–4.8)
Alpha 1: 0.3 g/dL (ref 0.2–0.3)
Alpha 2: 0.9 g/dL (ref 0.5–0.9)
Beta 2: 0.5 g/dL (ref 0.2–0.5)
Beta Globulin: 0.6 g/dL (ref 0.4–0.6)
Gamma Globulin: 1.3 g/dL (ref 0.8–1.7)
Total Protein: 7.6 g/dL (ref 6.1–8.1)

## 2018-11-02 ENCOUNTER — Other Ambulatory Visit: Payer: Self-pay | Admitting: Internal Medicine

## 2018-11-02 DIAGNOSIS — M62838 Other muscle spasm: Secondary | ICD-10-CM

## 2018-11-16 ENCOUNTER — Ambulatory Visit: Payer: Medicaid Other | Admitting: Internal Medicine

## 2018-11-16 ENCOUNTER — Encounter (INDEPENDENT_AMBULATORY_CARE_PROVIDER_SITE_OTHER): Payer: Self-pay | Admitting: Specialist

## 2018-11-18 DIAGNOSIS — M542 Cervicalgia: Secondary | ICD-10-CM | POA: Diagnosis not present

## 2018-11-18 DIAGNOSIS — Z79891 Long term (current) use of opiate analgesic: Secondary | ICD-10-CM | POA: Diagnosis not present

## 2018-11-18 DIAGNOSIS — Z79899 Other long term (current) drug therapy: Secondary | ICD-10-CM | POA: Diagnosis not present

## 2018-11-18 DIAGNOSIS — G894 Chronic pain syndrome: Secondary | ICD-10-CM | POA: Diagnosis not present

## 2018-11-19 ENCOUNTER — Telehealth: Payer: Self-pay | Admitting: Neurology

## 2018-11-19 ENCOUNTER — Ambulatory Visit (INDEPENDENT_AMBULATORY_CARE_PROVIDER_SITE_OTHER): Payer: Medicaid Other | Admitting: Neurology

## 2018-11-19 ENCOUNTER — Other Ambulatory Visit: Payer: Self-pay

## 2018-11-19 ENCOUNTER — Encounter: Payer: Self-pay | Admitting: Neurology

## 2018-11-19 DIAGNOSIS — R410 Disorientation, unspecified: Secondary | ICD-10-CM | POA: Insufficient documentation

## 2018-11-19 DIAGNOSIS — G43709 Chronic migraine without aura, not intractable, without status migrainosus: Secondary | ICD-10-CM | POA: Insufficient documentation

## 2018-11-19 DIAGNOSIS — IMO0002 Reserved for concepts with insufficient information to code with codable children: Secondary | ICD-10-CM | POA: Insufficient documentation

## 2018-11-19 DIAGNOSIS — G43909 Migraine, unspecified, not intractable, without status migrainosus: Secondary | ICD-10-CM | POA: Insufficient documentation

## 2018-11-19 MED ORDER — ERENUMAB-AOOE 70 MG/ML ~~LOC~~ SOAJ: 70.0000 mg | SUBCUTANEOUS | 11 refills | Status: DC

## 2018-11-19 MED ORDER — RIZATRIPTAN BENZOATE 10 MG PO TBDP: 10.0000 mg | ORAL_TABLET | ORAL | 6 refills | Status: DC | PRN

## 2018-11-19 NOTE — Telephone Encounter (Signed)
Medicaid order sent to [facility]. They will obtain the auth and reach out to the pt to schedule.  °

## 2018-11-19 NOTE — Progress Notes (Signed)
PATIENT: Briana Williams DOB: Jul 26, 1981  Virtual Visit via video  I connected with Briana Williams on 11/19/18 at  by video and verified that I am speaking with the correct person using two identifiers.   I discussed the limitations, risks, security and privacy concerns of performing an evaluation and management service by video and the availability of in person appointments. I also discussed with the patient that there may be a patient responsible charge related to this service. The patient expressed understanding and agreed to proceed.  HISTORICAL  Briana Williams is a 38 year old female, seen in request by orthopedic surgeon Dr. Basil Dess for evaluation of memory loss.  I have reviewed and summarized the referring note from the referring physician.   She had a past medical history of hypertension, diabetes, hyperlipidemia, chronic migraine headaches, had a history of severe scoliosis, had a Harrington rod placement at age 35, still has chronic low back pain, neck pain, later had multiple surgery due to florid infection in the past, is currently under pain management, on chronic narcotic treatment.  She lives at home with her family, is on the disability, baseline gait abnormality, require electronic wheelchair on long distance.  I reviewed hospital discharge summary on October 04, 2018, she was admitted for increased confusion, lethargy, falling asleep during conversation, it was considered due to polypharmacy treatment, recent increase of morphine dose to 30 mg 3 times a day, there was also a left great toe nonhealing ulcers, she was treated with antibiotics, overall she has much improved, but still has intermittent confusion episode.  She described difficulty finding her way around while driving,  She fell on October 26, 2018, she got up using bathroom, without any reasons on the floor, transient loss of consciousness, she was confused when she was awake, also complains of  worsening neck pain, headaches,  I personally reviewed CT head without contrast on October 03, 2018: No acute abnormality.  She is currently on polypharmacy treatment including pain management gabapentin 166m 3 tid, morphone 375mtid, lexpro 2032mtizanidine 60 mg 3 times daily, she denies significant benefit from gabapentin   She also has history of chronic migraine headaches, complains of worsening neck pain, headaches since her fall on October 26, 2018  Observations/Objective: I have reviewed problem lists, medications, allergies.  She needs to pushup to get up from seated position, bending over, cautious, unsteady gait  Assessment and Plan: Memory loss  In the setting of mood disorder, chronic illness, chronic pain, polypharmacy treatment.  MRI of the brain to rule out structural lesion  Laboratory evaluation to rule out treatable etiology  EEG  Follow Up Instructions:   2 months   I discussed the assessment and treatment plan with the patient. The patient was provided an opportunity to ask questions and all were answered. The patient agreed with the plan and demonstrated an understanding of the instructions.   The patient was advised to call back or seek an in-person evaluation if the symptoms worsen or if the condition fails to improve as anticipated.  CT head march 2020  I provided 30 minutes of non-face-to-face time during this encounter.  REVIEW OF SYSTEMS: Full 14 system review of systems performed and notable only for as above. All other review of systems were negative.  ALLERGIES: Allergies  Allergen Reactions  . Hydrocodone Itching    Note: benadryl does NOT help  . Metformin And Related Other (See Comments)    Causes blisters on skin  . Norvasc [  Amlodipine Besylate] Swelling    Ankle swelling  . Ultrasound Gel Rash and Other (See Comments)    Patient gets a red rash with use of ultrasound gel/ burns skin    HOME MEDICATIONS: Current Outpatient Medications   Medication Sig Dispense Refill  . ACCU-CHEK SOFTCLIX LANCETS lancets 1 each by Other route 3 (three) times daily. ICD 10 E11.9 100 each 12  . albuterol (PROVENTIL HFA;VENTOLIN HFA) 108 (90 Base) MCG/ACT inhaler Inhale 1 puff into the lungs every 6 (six) hours as needed for wheezing or shortness of breath. 8 g 3  . amitriptyline (ELAVIL) 25 MG tablet Take 1 tablet (25 mg total) by mouth at bedtime. 30 tablet 6  . atorvastatin (LIPITOR) 10 MG tablet Take 1 tablet (10 mg total) by mouth daily. 90 tablet 3  . Blood Glucose Monitoring Suppl (ACCU-CHEK AVIVA PLUS) w/Device KIT 1 Device by Does not apply route 3 (three) times daily after meals. ICD 10 E11.9 1 kit 0  . carvedilol (COREG) 3.125 MG tablet Take 1 tablet (3.125 mg total) by mouth 2 (two) times daily with a meal. 60 tablet 6  . Continuous Blood Gluc Sensor (FREESTYLE LIBRE SENSOR SYSTEM) MISC Change sensor Q 2 wks 2 each 12  . doxycycline (VIBRAMYCIN) 100 MG capsule Take 1 capsule (100 mg total) by mouth 2 (two) times daily. 14 capsule 0  . escitalopram (LEXAPRO) 20 MG tablet Take 1 tablet (20 mg total) by mouth daily. 90 tablet 2  . fluticasone (FLONASE) 50 MCG/ACT nasal spray Place 2 sprays into both nostrils daily. 16 g 6  . gabapentin (NEURONTIN) 100 MG capsule TAKE 3 CAPSULES BY MOUTH THREE TIMES DAILY (Patient taking differently: Take 300 mg by mouth 3 (three) times daily. ) 270 capsule 5  . glucose blood (ACCU-CHEK AVIVA PLUS) test strip 1 each by Other route 3 (three) times daily. ICD 10 E11.9 100 each 12  . hydrochlorothiazide (HYDRODIURIL) 25 MG tablet Take 1 tablet (25 mg total) by mouth daily. 90 tablet 2  . insulin aspart (NOVOLOG FLEXPEN) 100 UNIT/ML FlexPen Inject 24 Units into the skin 2 (two) times daily before a meal. 15 units subcut with the two largest meals of the day 15 mL 3  . Insulin Glargine (LANTUS SOLOSTAR) 100 UNIT/ML Solostar Pen Inject 38 Units into the skin daily. 15 mL 11  . Insulin Pen Needle (B-D ULTRAFINE III  SHORT PEN) 31G X 8 MM MISC 1 application by Does not apply route daily. 60 each 5  . Lancet Devices (ACCU-CHEK SOFTCLIX) lancets 1 each by Other route 3 (three) times daily. ICD 10 E11.9 1 each 0  . lisinopril (PRINIVIL,ZESTRIL) 40 MG tablet TAKE 1 TABLET BY MOUTH DAILY (Patient taking differently: Take 40 mg by mouth daily. ) 90 tablet 2  . meloxicam (MOBIC) 15 MG tablet Take 15 mg by mouth daily.    Marland Kitchen morphine (MSIR) 30 MG tablet Take 30 mg by mouth 3 (three) times daily as needed (pain).     . pantoprazole (PROTONIX) 40 MG tablet Take 1 tablet (40 mg total) by mouth daily. 30 tablet 0  . sitaGLIPtin (JANUVIA) 50 MG tablet Take 1 tablet (50 mg total) by mouth daily. 30 tablet 11  . SUMAtriptan (IMITREX) 25 MG tablet Take 1-2 tabs at start of headache. May repeat in 2 hrs if no relief. Max 4 tabs/24 hr. (Patient taking differently: Take 25-50 mg by mouth every 2 (two) hours as needed for migraine. Take 1-2 tabs at start of headache.  May repeat in 2 hrs if no relief. Max 4 tabs/24 hr.) 10 tablet 1  . tiZANidine (ZANAFLEX) 4 MG tablet TAKE 1 TABLET(4 MG) BY MOUTH EVERY 8 HOURS AS NEEDED FOR MUSCLE SPASMS 30 tablet 2  . triamcinolone cream (KENALOG) 0.1 % Apply 1 application topically 2 (two) times daily. 30 g 0   No current facility-administered medications for this visit.     PAST MEDICAL HISTORY: Past Medical History:  Diagnosis Date  . Arthritis   . GERD (gastroesophageal reflux disease)   . Headache    migraine 2 weeks ago  . Hyperlipidemia   . Hypertension associated with diabetes ([place])   . Renal disorder    has both kidneys, compacted and 'squashed together"  . Scoliosis   . Sleep apnea    in the midst of being tested for it.   waiting on scheduling   . Type 2 diabetes mellitus treated with insulin ([facility])    dx 2000    PAST SURGICAL HISTORY: Past Surgical History:  Procedure Laterality Date  . BACK SURGERY    . CERVICAL FUSION    . frontal closed sutures to forehead    .  Harrington Rod placement    . HIP SURGERY     staph infection  . LUMBAR FUSION    . RADIOLOGY WITH ANESTHESIA N/A 02/25/2017   Procedure: RADIOLOGY WITH ANESTHESIA/MRI THORACIC SPINE AND LEFT SHOULDER WITHOUT CONTRAST.;  Surgeon: Radiologist, Medication, MD;  Location: [facility];  Service: Radiology;  Laterality: N/A;    FAMILY HISTORY: Family History  Problem Relation Age of Onset  . Cancer Maternal Grandmother        mandibular cancer   . Diabetes Mother   . Heart failure Mother   . CAD Mother        triple CABG  . Diabetes Maternal Aunt   . Stroke Father     SOCIAL HISTORY:   Social History   Socioeconomic History  . Marital status: Married    Spouse name: Not on file  . Number of children: 3  . Years of education: Not on file  . Highest education level: Not on file  Occupational History    Employer: UNEMPLOYED  Social Needs  . Financial resource strain: Not on file  . Food insecurity:    Worry: Not on file    Inability: Not on file  . Transportation needs:    Medical: Not on file    Non-medical: Not on file  Tobacco Use  . Smoking status: Never Smoker  . Smokeless tobacco: Never Used  Substance and Sexual Activity  . Alcohol use: No  . Drug use: No  . Sexual activity: Yes    Birth control/protection: Other-see comments    Comment: unable to have children d/t scoliosis  Lifestyle  . Physical activity:    Days per week: Not on file    Minutes per session: Not on file  . Stress: Not on file  Relationships  . Social connections:    Talks on phone: Not on file    Gets together: Not on file    Attends religious service: Not on file    Active member of club or organization: Not on file    Attends meetings of clubs or organizations: Not on file    Relationship status: Not on file  . Intimate partner violence:    Fear of current or ex partner: Not on file    Emotionally abused: Not on file    Physically  abused: Not on file    Forced sexual activity: Not on  file  Other Topics Concern  . Not on file  Social History Narrative  . Not on file    Marcial Pacas, M.D. Ph.D.  [facility] [address], [place], [place] [address] Ph: [phone] Fax: [phone]  CC: Jessy Oto, MD

## 2018-11-23 ENCOUNTER — Other Ambulatory Visit: Payer: Self-pay

## 2018-11-23 ENCOUNTER — Ambulatory Visit (INDEPENDENT_AMBULATORY_CARE_PROVIDER_SITE_OTHER): Payer: Medicaid Other | Admitting: Specialist

## 2018-11-23 ENCOUNTER — Encounter (INDEPENDENT_AMBULATORY_CARE_PROVIDER_SITE_OTHER): Payer: Self-pay | Admitting: Specialist

## 2018-11-23 ENCOUNTER — Ambulatory Visit (INDEPENDENT_AMBULATORY_CARE_PROVIDER_SITE_OTHER): Payer: Medicaid Other

## 2018-11-23 ENCOUNTER — Ambulatory Visit: Payer: Medicaid Other | Admitting: Internal Medicine

## 2018-11-23 VITALS — BP 143/76 | HR 73 | Ht <= 58 in | Wt 227.0 lb

## 2018-11-23 DIAGNOSIS — M4322 Fusion of spine, cervical region: Secondary | ICD-10-CM | POA: Diagnosis not present

## 2018-11-23 DIAGNOSIS — S161XXA Strain of muscle, fascia and tendon at neck level, initial encounter: Secondary | ICD-10-CM | POA: Diagnosis not present

## 2018-11-23 DIAGNOSIS — M542 Cervicalgia: Secondary | ICD-10-CM | POA: Diagnosis not present

## 2018-11-23 MED ORDER — ALPRAZOLAM 0.25 MG PO TABS: ORAL_TABLET | ORAL | 0 refills | Status: DC

## 2018-11-23 NOTE — Progress Notes (Signed)
Office Visit Note   Patient: Briana Williams           Date of Birth: 04/20/1981           MRN: [id number] Visit Date: 11/23/2018              Requested by: Ladell Pier, MD [address] [place], [place] [address] PCP: Ladell Pier, MD   Assessment & Plan: Visit Diagnoses:  1. Cervicalgia   2. Cervical vertebral fusion     Plan: Avoid overhead lifting and overhead use of the arms. Do not lift greater than 5 lbs. Adjust head rest in vehicle to prevent hyperextension if rear ended. Take extra precautions to avoid falling, including use of a cane if you feel weak. If you have worsening arm or leg numbness or weakness please call or go to an ER. An MRI of the cervical spine is ordered.  Follow-Up Instructions: No follow-ups on file.   Orders:  Orders Placed This Encounter  Procedures  . XR Cervical Spine 2 or 3 views   No orders of the defined types were placed in this encounter.     Procedures: No procedures performed   Clinical Data: No additional findings.   Subjective: Chief Complaint  Patient presents with  . Neck - Pain, Injury    S/p fall 10/27/18, also she has had 6 falls since    38 year old female with history of cervical fusion and decompression C2 to C7 with ACDF C3 to C7 in 2014, Dr. Roseanna [facility]. Then had ACDF by Dr. Carloyn Manner in 2009. She has been experiencing HAs that are severe and also falling out spells most recently and had office visit with Dr. Krista Blue. Dr. Krista Blue is ordering a MRI of the Brain. More recently another fall, she reports falling face forward onto the Rug. She has had previous vertebral artery  ligation   Review of Systems  Constitutional: Negative.  Negative for activity change, appetite change, chills, diaphoresis, fatigue, fever and unexpected weight change.  HENT: Negative.  Negative for congestion, dental problem, drooling, ear discharge, ear pain, facial swelling, hearing loss, mouth sores, nosebleeds,  postnasal drip, rhinorrhea, sinus pressure and sinus pain.   Eyes: Negative.  Negative for photophobia, pain, discharge, redness, itching and visual disturbance.  Respiratory: Negative.  Negative for apnea, cough, choking, chest tightness, shortness of breath, wheezing and stridor.   Cardiovascular: Negative.  Negative for chest pain, palpitations and leg swelling.  Gastrointestinal: Negative.  Negative for abdominal distention, abdominal pain, anal bleeding, blood in stool, constipation, diarrhea, nausea, rectal pain and vomiting.  Endocrine: Negative.  Negative for cold intolerance, heat intolerance, polydipsia, polyphagia and polyuria.  Genitourinary: Negative.  Negative for difficulty urinating, dyspareunia, dysuria, enuresis, flank pain, frequency, genital sores, hematuria, pelvic pain and urgency.  Musculoskeletal: Positive for neck pain. Negative for arthralgias, back pain, gait problem, joint swelling, myalgias and neck stiffness.  Skin: Negative.  Negative for color change, pallor, rash and wound.  Allergic/Immunologic: Negative.  Negative for environmental allergies, food allergies and immunocompromised state.  Neurological: Negative.  Negative for dizziness, tremors, seizures, syncope, facial asymmetry, speech difficulty, weakness, light-headedness, numbness and headaches.  Hematological: Negative.  Negative for adenopathy. Does not bruise/bleed easily.  Psychiatric/Behavioral: Negative.  Negative for agitation, behavioral problems, confusion, decreased concentration, dysphoric mood, hallucinations, self-injury, sleep disturbance and suicidal ideas. The patient is not nervous/anxious and is not hyperactive.      Objective: Vital Signs: BP (!) 143/76   Pulse  73   Ht 4\' 8"  (1.422 m)   Wt 227 lb (103 kg)   BMI 50.89 kg/m   Physical Exam  Back Exam   Tenderness  The patient is experiencing tenderness in the cervical.  Range of Motion  Extension: abnormal  Flexion: abnormal   Lateral bend right: abnormal  Lateral bend left: abnormal  Rotation right: abnormal  Rotation left: abnormal   Muscle Strength  Right Quadriceps:  5/5  Left Quadriceps:  5/5  Right Hamstrings:  5/5  Left Hamstrings:  5/5   Tests  Straight leg raise right: negative Straight leg raise left: negative  Reflexes  Patellar: normal Achilles: normal Biceps: normal Babinski's sign: normal   Other  Toe walk: normal Heel walk: normal Sensation: normal Gait: normal  Erythema: no back redness Scars: absent  Comments:  Tender right mastoid and right cervico-occiput, Eval of the right ear canal negative for hemotypanum, no erthryema.  Guarding with any ROM, improved with soft C-collar.  Deccreased strength left triceps and left intrinsics. Numbness left C8-T1 distribution.       Specialty Comments:  No specialty comments available.  Imaging: No results found.   PMFS History: Patient Active Problem List   Diagnosis Date Noted  . Confusion 11/19/2018  . Chronic migraine 11/19/2018  . Acute encephalopathy 10/03/2018  . AKI (acute kidney injury) ([facility]) 10/03/2018  . Diabetic foot ulcer ([facility]) 09/16/2018  . Diabetic foot infection ([facility]) 07/12/2018  . Morbid obesity ([place]) 07/12/2018  . Toe infection 07/11/2018  . Muscle spasm of back 04/24/2017  . Osteoarthritis of both hips 01/15/2017  . Recurrent headache 01/06/2017  . Irregular menstrual cycle 05/30/2016  . Chronic pain syndrome 09/21/2015  . HYPERTRIGLYCERIDEMIA 05/02/2009  . 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 Insomnia 05/01/2009  . SCOLIOSIS 05/18/2008  . Diabetes type 2, uncontrolled ([facility]) 04/18/2008  . DEPRESSION 04/18/2008  . Essential hypertension, benign 04/18/2008   Past Medical History:  Diagnosis Date  . Arthritis   . GERD (gastroesophageal reflux disease)   . Headache    migraine 2 weeks ago  . Hyperlipidemia   . Hypertension associated with diabetes ([facility])   . Renal disorder    has both kidneys,  compacted and 'squashed together"  . Scoliosis   . Sleep apnea    in the midst of being tested for it.   waiting on scheduling   . Type 2 diabetes mellitus treated with insulin ([facility])    dx 2000    Family History  Problem Relation Age of Onset  . Cancer Maternal Grandmother        mandibular cancer   . Diabetes Mother   . Heart failure Mother   . CAD Mother        triple CABG  . Diabetes Maternal Aunt   . Stroke Father     Past Surgical History:  Procedure Laterality Date  . BACK SURGERY    . CERVICAL FUSION    . frontal closed sutures to forehead    . Harrington Rod placement    . HIP SURGERY     staph infection  . LUMBAR FUSION    . RADIOLOGY WITH ANESTHESIA N/A 02/25/2017   Procedure: RADIOLOGY WITH ANESTHESIA/MRI THORACIC SPINE AND LEFT SHOULDER WITHOUT CONTRAST.;  Surgeon: Radiologist, Medication, MD;  Location: [facility];  Service: Radiology;  Laterality: N/A;   Social History   Occupational History    Employer: UNEMPLOYED  Tobacco Use  . Smoking status: Never Smoker  . Smokeless tobacco: Never Used  Substance and  Sexual Activity  . Alcohol use: No  . Drug use: No  . Sexual activity: Yes    Birth control/protection: Other-see comments    Comment: unable to have children d/t scoliosis

## 2018-11-23 NOTE — Patient Instructions (Signed)
Plan: Avoid overhead lifting and overhead use of the arms. Do not lift greater than 5 lbs. Adjust head rest in vehicle to prevent hyperextension if rear ended. Take extra precautions to avoid falling, including use of a cane if you feel weak. If you have worsening arm or leg numbness or weakness please call or go to an ER. An MRI of the cervical spine is ordered

## 2018-11-26 ENCOUNTER — Other Ambulatory Visit: Payer: Self-pay | Admitting: Internal Medicine

## 2018-11-26 DIAGNOSIS — E1165 Type 2 diabetes mellitus with hyperglycemia: Principal | ICD-10-CM

## 2018-11-26 DIAGNOSIS — Z794 Long term (current) use of insulin: Principal | ICD-10-CM

## 2018-11-26 DIAGNOSIS — IMO0001 Reserved for inherently not codable concepts without codable children: Secondary | ICD-10-CM

## 2018-11-27 MED FILL — NOVOLOG FLEXPEN SYRINGE: 100 | 50 days supply | Qty: 15 | Fill #0

## 2018-12-01 ENCOUNTER — Other Ambulatory Visit: Payer: Self-pay

## 2018-12-01 ENCOUNTER — Ambulatory Visit
Admission: RE | Admit: 2018-12-01 | Discharge: 2018-12-01 | Disposition: A | Discharge status: Home / Self Care | Payer: Medicaid Other | Source: Ambulatory Visit | Attending: Neurology | Admitting: Neurology

## 2018-12-01 DIAGNOSIS — R410 Disorientation, unspecified: Secondary | ICD-10-CM

## 2018-12-02 ENCOUNTER — Telehealth: Payer: Self-pay | Admitting: Neurology

## 2018-12-02 NOTE — Telephone Encounter (Signed)
Please call patient, MRI of brain showed no significant abnormalities.   The study is slightly limited due to motion artifacts.  There are mild changes of scattered subcortical and periventricular white matter hyperintensities which are nonspecific and may be seen in a variety of conditions including small vessel disease, vasculitis, demyelinating, headache, remote trauma, autoimmune inflammatory conditions.  There are mild changes of paranasal sinus mucosal thickening.  No other structural lesion, tumor or infarcts are noted.

## 2018-12-02 NOTE — Telephone Encounter (Signed)
I spoke to the patient and she is aware of her MRI results below.

## 2018-12-02 NOTE — Telephone Encounter (Signed)
Please call patient, MRI of brain showed no significant abnormalities.  The study is slightly limited due to motion artifacts.  There are mild changes of scattered subcortical and periventricular white matter hyperintensities which are nonspecific and may be seen in a variety of conditions including small vessel disease, vasculitis, demyelinating, headache, remote trauma, autoimmune inflammatory conditions.  There are mild changes of paranasal sinus mucosal thickening.  No other structural lesion, tumor or infarcts are noted.

## 2018-12-03 ENCOUNTER — Ambulatory Visit: Payer: Medicaid Other | Attending: Internal Medicine | Admitting: Internal Medicine

## 2018-12-03 ENCOUNTER — Other Ambulatory Visit: Payer: Self-pay

## 2018-12-03 ENCOUNTER — Other Ambulatory Visit: Payer: Self-pay | Admitting: Internal Medicine

## 2018-12-03 DIAGNOSIS — IMO0001 Reserved for inherently not codable concepts without codable children: Secondary | ICD-10-CM

## 2018-12-03 DIAGNOSIS — D649 Anemia, unspecified: Secondary | ICD-10-CM

## 2018-12-03 DIAGNOSIS — Z79899 Other long term (current) drug therapy: Secondary | ICD-10-CM

## 2018-12-03 DIAGNOSIS — G894 Chronic pain syndrome: Secondary | ICD-10-CM

## 2018-12-03 DIAGNOSIS — G4700 Insomnia, unspecified: Secondary | ICD-10-CM

## 2018-12-03 DIAGNOSIS — I10 Essential (primary) hypertension: Secondary | ICD-10-CM

## 2018-12-03 DIAGNOSIS — Z794 Long term (current) use of insulin: Secondary | ICD-10-CM

## 2018-12-03 DIAGNOSIS — Z9181 History of falling: Secondary | ICD-10-CM | POA: Diagnosis not present

## 2018-12-03 DIAGNOSIS — E1165 Type 2 diabetes mellitus with hyperglycemia: Secondary | ICD-10-CM

## 2018-12-03 MED ORDER — CARVEDILOL 6.25 MG PO TABS: 3.1250 mg | ORAL_TABLET | Freq: Two times a day (BID) | ORAL | 6 refills | Status: DC

## 2018-12-03 MED ORDER — GABAPENTIN 100 MG PO CAPS: 300.0000 mg | ORAL_CAPSULE | Freq: Two times a day (BID) | ORAL | 0 refills | Status: DC

## 2018-12-03 MED ORDER — PANTOPRAZOLE SODIUM 40 MG PO TBEC: 40.0000 mg | DELAYED_RELEASE_TABLET | Freq: Every day | ORAL | 0 refills | Status: DC

## 2018-12-03 MED ORDER — AMITRIPTYLINE HCL 25 MG PO TABS: 25.0000 mg | ORAL_TABLET | Freq: Every day | ORAL | 6 refills | Status: DC

## 2018-12-03 NOTE — Progress Notes (Signed)
Virtual Visit via Video Note Due to current restrictions/limitations of in-office visits due to the COVID-19 pandemic, this scheduled clinical appointment was converted to a telehealth visit  I connected with Briana Williams on 12/03/18 at 3:27 p.m EDT by a video enabled telemedicine application and verified that I am speaking with the correct person using two identifiers. I am in my office.  The patient is at home.  Only the patient and myself participated in this encounter.  I discussed the limitations of evaluation and management by telemedicine and the availability of in person appointments. The patient expressed understanding and agreed to proceed.  History of Present Illness: Hx of scoliosis(multiple back surgeries), HTN, insomnia, DM2, depression, migraines. OA hips seen on MRI,  Since last visit with me, patient was hospitalized 3/6- 02/2019 with acute encephalopathy thought to be secondary to polypharmacy.  Patient presented with confusion, lethargy and falling asleep during conversation.  Patient states that she was placed on MSIR 30 mg that had just been increase from BID to 3 times a day.  She is also on Zanaflex and Neurontin.  CT of the head was negative for any acute findings.  There was no evidence of active infection.  She was advised to decrease the frequency of MSIR to twice a day dosing.  However patient states that she has remained on TID.  Her pain specialist thinks that her body was getting used to the increased dose.  She reports a fall at home on March 30 during the early morning hours.  She had gotten up out of bed, not sure why and lacking memory of it.  States that she fell face down and was confused when found by her husband.  She has felt somewhat off balance since then.  Neck pain since then so she saw Dr. Louanne Skye who has ordered an MRI of the neck.  He referred her to neurology Dr. Krista Blue for further evaluation.  Dr. Krista Blue ordered an MRI of the head and EEG.  Dr. Rhea Belton note  states that the study was limited due to motion artifact but there were mild changes of scattered subcortical and periventricular white matter hyperintensities which are nonspecific and may be seen in a variety of conditions including small vessel disease, headache, demyelinating, vasculitis, remote trauma.  Her overall assessment was that the MRI of the brain showed no significant abnormalities. Patient states Dr. Krista Blue wants her to get off gabapentin.  Ulcer LT foot: Patient reports that this is completely healed.    DM: BS running below 180s.  Checking BS 1-2 x a wk.  BS in a.m b/w 120-140.  No hypoglycemia Eating habits:  Eating a lot of green grapes and apples; avoids sugary drinks Compliant with meds   HTN:  Not checked BP in a while but does have a home device Blood pressure readings at last 2 visit with Dr. Louanne Skye  noted to be above goal. She limits salt in the foods.    Observations/Objective:   Chemistry      Component Value Date/Time   NA 138 10/04/2018 1207   NA 138 09/15/2018 1702   K 3.7 10/04/2018 1207   CL 105 10/04/2018 1207   CO2 25 10/04/2018 1207   BUN 17 10/04/2018 1207   BUN 18 09/15/2018 1702   CREATININE 0.84 10/04/2018 1207   CREATININE 0.59 09/21/2015 1740      Component Value Date/Time   CALCIUM 9.1 10/04/2018 1207   ALKPHOS 69 10/04/2018 1207   AST 24 10/04/2018  1207   ALT 20 10/04/2018 1207   BILITOT 0.5 10/04/2018 1207   BILITOT 0.3 02/06/2018 1632     Lab Results  Component Value Date   WBC 5.6 10/04/2018   HGB 9.4 (L) 10/04/2018   HCT 29.0 (L) 10/04/2018   MCV 85.8 10/04/2018   PLT 193 10/04/2018    Lab Results  Component Value Date   HGBA1C 10.3 (A) 09/15/2018    Assessment and Plan: 1. Uncontrolled type 2 diabetes mellitus without complication, with long-term current use of insulin ([place]) Not checking BS enough to give meaningful info.  Recommend continuing current insulin dose (Novolog 15 units with 2 larges meals of the day and  Lantus 38 units daily) and Januvia and checking BS 2 x daily and bring in readins on f/u in 6 wks - Hemoglobin A1c; Future  2. Essential hypertension Not at goal of 130/80 or lower.  Inc Coreg to 6.25 mg BID - carvedilol (COREG) 6.25 MG tablet; Take 0.5 tablets (3.125 mg total) by mouth 2 (two) times daily with a meal.  Dispense: 60 tablet; Refill: 6  3. Insomnia, unspecified type Pt request RF on Elavil - amitriptyline (ELAVIL) 25 MG tablet; Take 1 tablet (25 mg total) by mouth at bedtime.  Dispense: 30 tablet; Refill: 6  4. Chronic pain syndrome Now that she is on MSIR and Zanaflex, we can try weaning Gabapentin.  Advise to dec from 300 mg TID to BID.  I will touch base with Dr. Krista Blue then give further instructions for weaning.  5. Polypharmacy   6. History of recent fall -neurology work up in progress.  I will touch base with Dr. Krista Blue to inquire whether she wants her off Gabapentin than we can further taper. -I will discuss dec Zanaflex to BID on next visit. 7. Anemia, unspecified type - CBC; Future - Iron, TIBC and Ferritin Panel; Future   Follow Up Instructions: 6-7 wks   I discussed the assessment and treatment plan with the patient. The patient was provided an opportunity to ask questions and all were answered. The patient agreed with the plan and demonstrated an understanding of the instructions.   The patient was advised to call back or seek an in-person evaluation if the symptoms worsen or if the condition fails to improve as anticipated.  I provided 24 minutes of non-face-to-face time during this encounter.   Karle Plumber, MD

## 2018-12-03 NOTE — Progress Notes (Signed)
Pt states her left side hurts   Pt states neurologist doesn't want her on gabapentin anymore pt is needing a replacement

## 2018-12-07 ENCOUNTER — Other Ambulatory Visit: Payer: Self-pay

## 2018-12-07 ENCOUNTER — Ambulatory Visit
Admission: RE | Admit: 2018-12-07 | Discharge: 2018-12-07 | Disposition: A | Discharge status: Home / Self Care | Payer: Medicaid Other | Source: Ambulatory Visit | Attending: Specialist | Admitting: Specialist

## 2018-12-07 DIAGNOSIS — M4802 Spinal stenosis, cervical region: Secondary | ICD-10-CM | POA: Diagnosis not present

## 2018-12-07 DIAGNOSIS — S161XXA Strain of muscle, fascia and tendon at neck level, initial encounter: Secondary | ICD-10-CM

## 2018-12-07 DIAGNOSIS — M4322 Fusion of spine, cervical region: Secondary | ICD-10-CM

## 2018-12-07 DIAGNOSIS — M542 Cervicalgia: Secondary | ICD-10-CM

## 2018-12-09 ENCOUNTER — Other Ambulatory Visit: Payer: Medicaid Other

## 2018-12-16 ENCOUNTER — Ambulatory Visit (INDEPENDENT_AMBULATORY_CARE_PROVIDER_SITE_OTHER): Payer: Medicaid Other | Admitting: Specialist

## 2018-12-16 ENCOUNTER — Other Ambulatory Visit: Payer: Self-pay

## 2018-12-16 ENCOUNTER — Encounter: Payer: Self-pay | Admitting: Specialist

## 2018-12-16 VITALS — BP 108/66 | HR 81 | Ht <= 58 in | Wt 227.0 lb

## 2018-12-16 DIAGNOSIS — M542 Cervicalgia: Secondary | ICD-10-CM | POA: Diagnosis not present

## 2018-12-16 DIAGNOSIS — G9589 Other specified diseases of spinal cord: Secondary | ICD-10-CM | POA: Diagnosis not present

## 2018-12-16 DIAGNOSIS — G894 Chronic pain syndrome: Secondary | ICD-10-CM | POA: Diagnosis not present

## 2018-12-16 DIAGNOSIS — Z9181 History of falling: Secondary | ICD-10-CM | POA: Diagnosis not present

## 2018-12-16 DIAGNOSIS — G5622 Lesion of ulnar nerve, left upper limb: Secondary | ICD-10-CM

## 2018-12-16 DIAGNOSIS — M4325 Fusion of spine, thoracolumbar region: Secondary | ICD-10-CM

## 2018-12-16 DIAGNOSIS — Z79891 Long term (current) use of opiate analgesic: Secondary | ICD-10-CM | POA: Diagnosis not present

## 2018-12-16 DIAGNOSIS — Z981 Arthrodesis status: Secondary | ICD-10-CM

## 2018-12-16 DIAGNOSIS — M419 Scoliosis, unspecified: Secondary | ICD-10-CM | POA: Diagnosis not present

## 2018-12-16 DIAGNOSIS — M4802 Spinal stenosis, cervical region: Secondary | ICD-10-CM | POA: Diagnosis not present

## 2018-12-16 DIAGNOSIS — M549 Dorsalgia, unspecified: Secondary | ICD-10-CM | POA: Diagnosis not present

## 2018-12-16 DIAGNOSIS — G8929 Other chronic pain: Secondary | ICD-10-CM | POA: Diagnosis not present

## 2018-12-16 DIAGNOSIS — M40293 Other kyphosis, cervicothoracic region: Secondary | ICD-10-CM

## 2018-12-16 DIAGNOSIS — G992 Myelopathy in diseases classified elsewhere: Secondary | ICD-10-CM | POA: Diagnosis not present

## 2018-12-16 MED ORDER — DIAZEPAM 5 MG PO TABS: ORAL_TABLET | ORAL | 0 refills | Status: DC

## 2018-12-16 NOTE — Patient Instructions (Addendum)
Plan: Avoid overhead lifting and overhead use of the arms. Do not lift greater than 5 lbs. Adjust head rest in vehicle to prevent hyperextension if rear ended.Take extra precautions to avoid falling. Avoid frequent bending and stooping  No lifting greater than 10 lbs. May use ice or moist heat for pain. Weight loss is of benefit. Best medication for lumbar disc disease is arthritis medications like motrin, celebrex and naprosyn. Exercise is important to improve your indurance and does allow people to function better inspite of back pain. MRI of the thoracic spine to assess for worsening of the thoracic syrinx There is spinal stenosis residual in the area of C2-3 and this may be associated with persistence of the syrinx in the thoracic. A consultation with Dr> Glenna Fellows is requested to evaluate cervical stenosis and thoracic syrinx in light of the increasing bowel and bladder incontinence and lower extremity weakness and increasing falls Valium to take for anxiety associated with MRI of the thoracic spine

## 2018-12-16 NOTE — Progress Notes (Signed)
Office Visit Note   Patient: Briana Williams           Date of Birth: 1981-06-15           MRN: [id number] Visit Date: 12/16/2018              Requested by: Ladell Pier, MD [address] [place], [place] [address] PCP: Ladell Pier, MD   Assessment & Plan: Visit Diagnoses:  1. Myelopathy concurrent with and due to spinal stenosis of cervical region [facility])   2. Scoliosis of thoracolumbar spine, unspecified scoliosis type   3. At high risk for falls   4. 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 Fusion of spine of thoracolumbar region   6. Status post cervical spinal fusion   7. Cubital tunnel syndrome on left   8. Other kyphosis of cervicothoracic region   9. Arthrodesis status     Plan: Avoid overhead lifting and overhead use of the arms. Do not lift greater than 5 lbs. Adjust head rest in vehicle to prevent hyperextension if rear ended.Take extra precautions to avoid falling. Avoid frequent bending and stooping  No lifting greater than 10 lbs. May use ice or moist heat for pain. Weight loss is of benefit. Best medication for lumbar disc disease is arthritis medications like motrin, celebrex and naprosyn. Exercise is important to improve your indurance and does allow people to function better inspite of back pain. MRI of the thoracic spine to assess for worsening of the thoracic syrinx There is spinal stenosis residual in the area of C2-3 and this may be associated with persistence of the syrinx in the thoracic. A consultation with Dr> Glenna Fellows is requested to evaluate cervical stenosis and thoracic syrinx in light of the increasing bowel and bladder incontinence and lower extremity weakness and increasing falls.  Follow-Up Instructions: Return in about 3 months (around 03/18/2019).   Orders:  Orders Placed This Encounter  Procedures  . MR Thoracic Spine w/o contrast  . Ambulatory referral to Neurosurgery   Meds ordered this encounter  Medications  .  diazepam (VALIUM) 5 MG tablet    Sig: Take one valium tablet at the MRI after paperwork and may repeat if anxiety persists.    Dispense:  2 tablet    Refill:  0      Procedures: No procedures performed   Clinical Data: No additional findings.   Subjective: Chief Complaint  Patient presents with  . Neck - Follow-up    Eval for Hoveround  . Lower Back - Follow-up    38 year old right handed female with history of long cervical fusion C2 to C6 with posterior instrumentation C2-3 and Persistent left C2-3 and bilateral C3-4 cervical stenosis. She has persisting pain in the neck, shoulders, arms and lower back. Her last surgery was C2-3 and extended from C1? To the cervicothoracic junction by Dr. Aris Everts and PA Gaynelle Cage. She underwent a C2-3 fusion and posterior decompression from C3 to C7. She now complains of severe pain. "I almost felt like I wanted to call you to tell you that I was in severe pain" She saw Dr. Carloyn Manner and had anterior cervical fusions C3-6 prior to the surgery at [facility] in 08/2012. She has intermittant fecal and bladder incontinence that is worsening over the last 2 years. Episodes of incontinence that happens and is not recognized that the bowels or bladder has moved. In the evening may void with no knowledge of the event, have  to get up and change the sheets. She brings with her today a sheet for evaluation for a new Hoveround. She has had the Hoveround for over 5 years. She can only ambulate without help or assistance a few feet, she uses her arms to support her in moving through the house but  Can not walk more than from the bedroom to the kitchen. She has used a Hoveround for ambulation beyond 100 feet. If she tries to walk 100' or more her legs start to give out and she is falling with attempts and further ambulation. She mainly has weakness in left arm greater than right and in the left leg greater than the right.    Review of Systems  Constitutional: Positive  for activity change, fatigue and unexpected weight change. Negative for appetite change, chills, diaphoresis and fever.  HENT: Positive for sore throat and trouble swallowing. Negative for congestion, dental problem, drooling, ear discharge, ear pain, facial swelling, hearing loss, mouth sores, nosebleeds, postnasal drip, rhinorrhea, sinus pressure, sinus pain, sneezing, tinnitus and voice change.   Eyes: Negative for photophobia, pain, discharge, redness, itching and visual disturbance.  Respiratory: Negative for apnea, cough, choking, chest tightness, shortness of breath, wheezing and stridor.   Cardiovascular: Negative for chest pain, palpitations and leg swelling.  Gastrointestinal: Positive for vomiting. Negative for abdominal distention, abdominal pain, anal bleeding, blood in stool, constipation, diarrhea, nausea and rectal pain.  Endocrine: Negative for cold intolerance, heat intolerance, polydipsia, polyphagia and polyuria.  Genitourinary: Positive for enuresis, frequency and urgency. Negative for dyspareunia, dysuria, hematuria, menstrual problem and pelvic pain.  Musculoskeletal: Positive for back pain, neck pain and neck stiffness. Negative for arthralgias, gait problem, joint swelling and myalgias.  Skin: Negative for color change, pallor, rash and wound.  Allergic/Immunologic: Negative for environmental allergies, food allergies and immunocompromised state.  Neurological: Positive for weakness and numbness. Negative for dizziness, tremors, seizures, syncope, facial asymmetry, speech difficulty, light-headedness and headaches.  Hematological: Negative for adenopathy. Does not bruise/bleed easily.  Psychiatric/Behavioral: Negative for agitation, behavioral problems, confusion, decreased concentration, dysphoric mood, hallucinations, self-injury, sleep disturbance and suicidal ideas. The patient is not nervous/anxious and is not hyperactive.      Objective: Vital Signs: BP 108/66 (BP  Location: Left Arm, Patient Position: Sitting)   Pulse 81   Ht 4\' 8"  (1.422 m)   Wt 227 lb (103 kg)   BMI 50.89 kg/m   Physical Exam  Back Exam   Tenderness  The patient is experiencing tenderness in the cervical and lumbar.  Range of Motion  Extension:  10 abnormal  Flexion:  10 abnormal  Lateral bend right:  10 abnormal  Lateral bend left:  10 abnormal  Rotation right:  20 abnormal  Rotation left: 20   Muscle Strength  Right Quadriceps:  5/5  Left Quadriceps:  5/5  Right Hamstrings:  5/5  Left Hamstrings:  5/5   Tests  Straight leg raise right: negative Straight leg raise left: negative  Reflexes  Patellar: 1/4 Achilles: 0/4 Babinski's sign: normal   Comments:  Hoffman's sign is negative.      Specialty Comments:  No specialty comments available.  Imaging: No results found.   PMFS History: Patient Active Problem List   Diagnosis Date Noted  . Confusion 11/19/2018  . Chronic migraine 11/19/2018  . Acute encephalopathy 10/03/2018  . AKI (acute kidney injury) ([place]) 10/03/2018  . Diabetic foot ulcer ([place]) 09/16/2018  . Diabetic foot infection ([place]) 07/12/2018  . Morbid obesity ([place]) 07/12/2018  .  Toe infection 07/11/2018  . Muscle spasm of back 04/24/2017  . Osteoarthritis of both hips 01/15/2017  . Recurrent headache 01/06/2017  . Irregular menstrual cycle 05/30/2016  . Chronic pain syndrome 09/21/2015  . HYPERTRIGLYCERIDEMIA 05/02/2009  . 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 Insomnia 05/01/2009  . SCOLIOSIS 05/18/2008  . Diabetes type 2, uncontrolled ([place]) 04/18/2008  . DEPRESSION 04/18/2008  . Essential hypertension, benign 04/18/2008   Past Medical History:  Diagnosis Date  . Arthritis   . GERD (gastroesophageal reflux disease)   . Headache    migraine 2 weeks ago  . Hyperlipidemia   . Hypertension associated with diabetes ([place])   . Renal disorder    has both kidneys, compacted and 'squashed together"  . Scoliosis   . Sleep apnea    in  the midst of being tested for it.   waiting on scheduling   . Type 2 diabetes mellitus treated with insulin ([facility])    dx 2000    Family History  Problem Relation Age of Onset  . Cancer Maternal Grandmother        mandibular cancer   . Diabetes Mother   . Heart failure Mother   . CAD Mother        triple CABG  . Diabetes Maternal Aunt   . Stroke Father     Past Surgical History:  Procedure Laterality Date  . BACK SURGERY    . CERVICAL FUSION    . frontal closed sutures to forehead    . Harrington Rod placement    . HIP SURGERY     staph infection  . LUMBAR FUSION    . RADIOLOGY WITH ANESTHESIA N/A 02/25/2017   Procedure: RADIOLOGY WITH ANESTHESIA/MRI THORACIC SPINE AND LEFT SHOULDER WITHOUT CONTRAST.;  Surgeon: Radiologist, Medication, MD;  Location: [facility];  Service: Radiology;  Laterality: N/A;   Social History   Occupational History    Employer: UNEMPLOYED  Tobacco Use  . Smoking status: Never Smoker  . Smokeless tobacco: Never Used  Substance and Sexual Activity  . Alcohol use: No  . Drug use: No  . Sexual activity: Yes    Birth control/protection: Other-see comments    Comment: unable to have children d/t scoliosis

## 2018-12-18 ENCOUNTER — Telehealth: Payer: Self-pay | Admitting: Specialist

## 2018-12-18 NOTE — Telephone Encounter (Signed)
Spoke with Brittney from [facility] advised patient came to appointment 12/16/2018. Have not received the faxed paper work yet. Brittney said she will fax again.  [phone]   Ref# [id number]

## 2018-12-25 ENCOUNTER — Telehealth: Payer: Self-pay | Admitting: *Deleted

## 2018-12-25 NOTE — Telephone Encounter (Signed)
Patient called and notified of potential exposure to COVID-19 during recent visit and offered testing. Pt verbalized understanding but declines at this time. Pt advised to call back if she changes her mind. Understanding verbalized.

## 2018-12-28 ENCOUNTER — Other Ambulatory Visit: Payer: Self-pay | Admitting: Internal Medicine

## 2018-12-28 DIAGNOSIS — M62838 Other muscle spasm: Secondary | ICD-10-CM

## 2019-01-01 ENCOUNTER — Telehealth: Payer: Self-pay | Admitting: Orthopedic Surgery

## 2019-01-01 NOTE — Telephone Encounter (Signed)
Opened in error

## 2019-01-04 ENCOUNTER — Other Ambulatory Visit: Payer: Self-pay

## 2019-01-04 ENCOUNTER — Telehealth: Payer: Self-pay | Admitting: Radiology

## 2019-01-04 ENCOUNTER — Ambulatory Visit
Admission: RE | Admit: 2019-01-04 | Discharge: 2019-01-04 | Disposition: A | Discharge status: Home / Self Care | Payer: Medicaid Other | Source: Ambulatory Visit | Attending: Specialist | Admitting: Specialist

## 2019-01-04 DIAGNOSIS — Z981 Arthrodesis status: Secondary | ICD-10-CM

## 2019-01-04 DIAGNOSIS — M5124 Other intervertebral disc displacement, thoracic region: Secondary | ICD-10-CM | POA: Diagnosis not present

## 2019-01-04 NOTE — Telephone Encounter (Signed)
Briana Williams is calling to check the status of paperwork for Briana Williams's Hoveround.  ----Please advise --Ref # [id number]

## 2019-01-05 ENCOUNTER — Ambulatory Visit: Payer: Medicaid Other | Admitting: Neurology

## 2019-01-05 ENCOUNTER — Other Ambulatory Visit: Payer: Self-pay | Admitting: Internal Medicine

## 2019-01-06 ENCOUNTER — Encounter: Payer: Self-pay | Admitting: Specialist

## 2019-01-06 ENCOUNTER — Other Ambulatory Visit: Payer: Self-pay

## 2019-01-06 ENCOUNTER — Ambulatory Visit (INDEPENDENT_AMBULATORY_CARE_PROVIDER_SITE_OTHER): Payer: Medicaid Other | Admitting: Specialist

## 2019-01-06 VITALS — BP 122/71 | HR 85 | Ht <= 58 in | Wt 227.0 lb

## 2019-01-06 DIAGNOSIS — M4323 Fusion of spine, cervicothoracic region: Secondary | ICD-10-CM | POA: Diagnosis not present

## 2019-01-06 DIAGNOSIS — G9589 Other specified diseases of spinal cord: Secondary | ICD-10-CM

## 2019-01-06 DIAGNOSIS — M4712 Other spondylosis with myelopathy, cervical region: Secondary | ICD-10-CM | POA: Diagnosis not present

## 2019-01-06 NOTE — Progress Notes (Signed)
Office Visit Note   Patient: Briana Williams           Date of Birth: 1980-10-03           MRN: [id number] Visit Date: 01/06/2019              Requested by: Ladell Pier, MD [address] [place], [place] [address] PCP: Ladell Pier, MD   Assessment & Plan: Visit Diagnoses:  1. Other spondylosis with myelopathy, cervical region   2. Fusion of spine of cervicothoracic region   3. Persistent central canal syrinx (HCC)     Plan: Plan: Avoid overhead lifting and overhead use of the arms. Do not lift greater than 5 lbs. Adjust head rest in vehicle to prevent hyperextension if rear ended.Take extra precautions to avoid falling. Avoid frequent bending and stooping  No lifting greater than 10 lbs. May use ice or moist heat for pain. Weight loss is of benefit. Best medication for lumbar disc disease is arthritis medications like motrin, celebrex and naprosyn. Exercise is important to improve your indurance and does allow people to function better inspite of back pain. MRI of the thoracic spine to assess for worsening of the thoracic syrinx There is spinal stenosis residual in the area of C2-3 and this may be associated with persistence of the syrinx in the thoracic. A consultation with Dr.Joseph Vertell Limber is requested to evaluate cervical stenosis and thoracic syrinx in light of the increasing bowel and bladder incontinence and lower extremity weakness and increasing falls.  Follow-Up Instructions: Return in about 3 months (around 03/18/2019).  Fall Prevention and Home Safety Falls cause injuries and can affect all age groups. It is possible to use preventive measures to significantly decrease the likelihood of falls. There are many simple measures which can make your home safer and prevent falls. OUTDOORS  Repair cracks and edges of walkways and driveways.  Remove high doorway thresholds.  Trim shrubbery on the main path into your home.  Have good outside lighting.   Clear walkways of tools, rocks, debris, and clutter.  Check that handrails are not broken and are securely fastened. Both sides of steps should have handrails.  Have leaves, snow, and ice cleared regularly.  Use sand or salt on walkways during winter months.  In the garage, clean up grease or oil spills. BATHROOM  Install night lights.  Install grab bars by the toilet and in the tub and shower.  Use non-skid mats or decals in the tub or shower.  Place a plastic non-slip stool in the shower to sit on, if needed.  Keep floors dry and clean up all water on the floor immediately.  Remove soap buildup in the tub or shower on a regular basis.  Secure bath mats with non-slip, double-sided rug tape.  Remove throw rugs and tripping hazards from the floors. BEDROOMS  Install night lights.  Make sure a bedside light is easy to reach.  Do not use oversized bedding.  Keep a telephone by your bedside.  Have a firm chair with side arms to use for getting dressed.  Remove throw rugs and tripping hazards from the floor. KITCHEN  Keep handles on pots and pans turned toward the center of the stove. Use back burners when possible.  Clean up spills quickly and allow time for drying.  Avoid walking on wet floors.  Avoid hot utensils and knives.  Position shelves so they are not too high or low.  Place commonly used objects within  easy reach.  If necessary, use a sturdy step stool with a grab bar when reaching.  Keep electrical cables out of the way.  Do not use floor polish or wax that makes floors slippery. If you must use wax, use non-skid floor wax.  Remove throw rugs and tripping hazards from the floor. STAIRWAYS  Never leave objects on stairs.  Place handrails on both sides of stairways and use them. Fix any loose handrails. Make sure handrails on both sides of the stairways are as long as the stairs.  Check carpeting to make sure it is firmly attached along stairs.  Make repairs to worn or loose carpet promptly.  Avoid placing throw rugs at the top or bottom of stairways, or properly secure the rug with carpet tape to prevent slippage. Get rid of throw rugs, if possible.  Have an electrician put in a light switch at the top and bottom of the stairs. OTHER FALL PREVENTION TIPS  Wear low-heel or rubber-soled shoes that are supportive and fit well. Wear closed toe shoes.  When using a stepladder, make sure it is fully opened and both spreaders are firmly locked. Do not climb a closed stepladder.  Add color or contrast paint or tape to grab bars and handrails in your home. Place contrasting color strips on first and last steps.  Learn and use mobility aids as needed. Install an electrical emergency response system.  Turn on lights to avoid dark areas. Replace light bulbs that burn out immediately. Get light switches that glow.  Arrange furniture to create clear pathways. Keep furniture in the same place.  Firmly attach carpet with non-skid or double-sided tape.  Eliminate uneven floor surfaces.  Select a carpet pattern that does not visually hide the edge of steps.  Be aware of all pets. OTHER HOME SAFETY TIPS  Set the water temperature for 120 F (48.8 C).          Keep emergency numbers on or near the telephone.  Keep smoke detectors on every level of the home and near sleeping areas. Document Released: 07/05/2002 Document Revised: 01/14/2012 Document Reviewed: 10/04/2011 [facility] Patient Information 2014 [place]. Follow-Up Instructions: Return in about 4 weeks (around 02/03/2019).   Orders:  No orders of the defined types were placed in this encounter.  No orders of the defined types were placed in this encounter.     Procedures: No procedures performed   Clinical Data: No additional findings.   Subjective: Chief Complaint  Patient presents with  . Spine - Follow-up    She has had 2 more falls since she was here last,  left hand is completely numb    38 year old female right handed with history of thoracolumbar scoliosis she has had a long cervical fusion in the past, myelopathy concurrent with and due to spinal stenosis of cervical region [facility]), Scoliosis of thoracolumbar spine, unspecified scoliosis type. She is at high risk for falls, syrinx, persistent central canal ([place]).  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 She reports that the pain is severe and she is taking 2x30 mg MSER BID and 2-15 mg MSIR twice a day. She is being cared for in pain management at [facility].  She feels like in the last week and a half that the muscle spasm is worse. She has posterior neck and head pain with lying on her back. We had recommended that she  see Dr. Carloyn Manner for evaluation of the syrinx and persistent cervical stenosis but Dr.Roy has retired.       .    Review of Systems  Constitutional: Positive for activity change, appetite change and chills. Negative for diaphoresis, fatigue, fever and unexpected weight change (lost 32 lbs in the past one month.).  HENT: Negative.  Negative for congestion, dental problem, drooling, ear discharge, ear pain, facial swelling, hearing loss, mouth sores, nosebleeds, postnasal drip, rhinorrhea, sinus pressure, sinus pain, sneezing, sore throat, tinnitus, trouble swallowing and voice change.   Eyes: Negative for photophobia, pain, discharge, redness, itching and visual disturbance.  Respiratory: Negative for apnea, cough, choking, chest tightness, shortness of breath and stridor.   Cardiovascular: Positive for leg swelling. Negative for chest pain and palpitations.  Gastrointestinal: Positive for abdominal pain. Negative for abdominal distention, anal bleeding, blood in stool, constipation, diarrhea, nausea, rectal pain and vomiting.  Endocrine: Negative for cold intolerance, heat  intolerance, polydipsia, polyphagia and polyuria.  Genitourinary: Negative for difficulty urinating, dyspareunia, dysuria, enuresis, flank pain, frequency, genital sores, hematuria, menstrual problem, pelvic pain and urgency.  Musculoskeletal: Positive for back pain, gait problem, neck pain and neck stiffness. Negative for arthralgias, joint swelling and myalgias.  Skin: Negative.   Allergic/Immunologic: Negative for environmental allergies, food allergies and immunocompromised state.  Neurological: Positive for weakness, numbness and headaches. Negative for dizziness, tremors, seizures, syncope, facial asymmetry, speech difficulty and light-headedness.  Hematological: Negative for adenopathy. Does not bruise/bleed easily.  Psychiatric/Behavioral: Negative for agitation, behavioral problems, confusion, decreased concentration, dysphoric mood, hallucinations, self-injury, sleep disturbance and suicidal ideas. The patient is not nervous/anxious and is not hyperactive.      Objective: Vital Signs: BP 122/71 (BP Location: Left Arm, Patient Position: Sitting)   Pulse 85   Ht 4\' 8"  (1.422 m)   Wt 227 lb (103 kg)   BMI 50.89 kg/m   Physical Exam  Ortho Exam  Specialty Comments:  No specialty comments available.  Imaging: No results found.   PMFS History: Patient Active Problem List   Diagnosis Date Noted  . Confusion 11/19/2018  . Chronic migraine 11/19/2018  . Acute encephalopathy 10/03/2018  . AKI (acute kidney injury) ([place]) 10/03/2018  . Diabetic foot ulcer ([place]) 09/16/2018  . Diabetic foot infection ([place]) 07/12/2018  . Morbid obesity ([place]) 07/12/2018  . Toe infection 07/11/2018  . Muscle spasm of back 04/24/2017  . Osteoarthritis of both hips 01/15/2017  . Recurrent headache 01/06/2017  . Irregular menstrual cycle 05/30/2016  . Chronic pain syndrome 09/21/2015  . HYPERTRIGLYCERIDEMIA 05/02/2009  . 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 Insomnia 05/01/2009  . SCOLIOSIS 05/18/2008   . Diabetes type 2, uncontrolled ([place]) 04/18/2008  . DEPRESSION 04/18/2008  . Essential hypertension, benign 04/18/2008   Past Medical History:  Diagnosis Date  . Arthritis   . GERD (gastroesophageal reflux disease)   . Headache    migraine 2 weeks ago  . Hyperlipidemia   . Hypertension associated with diabetes ([place])   . Renal disorder    has both kidneys, compacted and 'squashed together"  . Scoliosis   . Sleep apnea    in the midst of being tested for it.   waiting on scheduling   . Type 2 diabetes mellitus treated with insulin (HCC)    dx 2000    Family History  Problem Relation Age of Onset  . Cancer Maternal Grandmother        mandibular cancer   . Diabetes Mother   . Heart failure Mother   .  CAD Mother        triple CABG  . Diabetes Maternal Aunt   . Stroke Father     Past Surgical History:  Procedure Laterality Date  . BACK SURGERY    . CERVICAL FUSION    . frontal closed sutures to forehead    . Harrington Rod placement    . HIP SURGERY     staph infection  . LUMBAR FUSION    . RADIOLOGY WITH ANESTHESIA N/A 02/25/2017   Procedure: RADIOLOGY WITH ANESTHESIA/MRI THORACIC SPINE AND LEFT SHOULDER WITHOUT CONTRAST.;  Surgeon: Radiologist, Medication, MD;  Location: [place];  Service: Radiology;  Laterality: N/A;   Social History   Occupational History    Employer: UNEMPLOYED  Tobacco Use  . Smoking status: Never Smoker  . Smokeless tobacco: Never Used  Substance and Sexual Activity  . Alcohol use: No  . Drug use: No  . Sexual activity: Yes    Birth control/protection: Other-see comments    Comment: unable to have children d/t scoliosis

## 2019-01-06 NOTE — Patient Instructions (Addendum)
Plan: Avoid overhead lifting and overhead use of the arms. Do not lift greater than 5 lbs. Adjust head rest in vehicle to prevent hyperextension if rear ended.Take extra precautions to avoid falling. Avoid frequent bending and stooping  No lifting greater than 10 lbs. May use ice or moist heat for pain. Weight loss is of benefit. Best medication for lumbar disc disease is arthritis medications like motrin, celebrex and naprosyn. Exercise is important to improve your indurance and does allow people to function better inspite of back pain. MRI of the thoracic spine to assess for worsening of the thoracic syrinx There is spinal stenosis residual in the area of C2-3 and this may be associated with persistence of the syrinx in the thoracic. A consultation with Dr.Joseph Vertell Limber is requested to evaluate cervical stenosis and thoracic syrinx in light of the increasing bowel and bladder incontinence and lower extremity weakness and increasing falls.  Follow-Up Instructions: Return in about 3 months (around 03/18/2019).   Fall Prevention and Home Safety Falls cause injuries and can affect all age groups. It is possible to use preventive measures to significantly decrease the likelihood of falls. There are many simple measures which can make your home safer and prevent falls. OUTDOORS  Repair cracks and edges of walkways and driveways.  Remove high doorway thresholds.  Trim shrubbery on the main path into your home.  Have good outside lighting.  Clear walkways of tools, rocks, debris, and clutter.  Check that handrails are not broken and are securely fastened. Both sides of steps should have handrails.  Have leaves, snow, and ice cleared regularly.  Use sand or salt on walkways during winter months.  In the garage, clean up grease or oil spills. BATHROOM  Install night lights.  Install grab bars by the toilet and in the tub and shower.  Use non-skid mats or decals in the tub or  shower.  Place a plastic non-slip stool in the shower to sit on, if needed.  Keep floors dry and clean up all water on the floor immediately.  Remove soap buildup in the tub or shower on a regular basis.  Secure bath mats with non-slip, double-sided rug tape.  Remove throw rugs and tripping hazards from the floors. BEDROOMS  Install night lights.  Make sure a bedside light is easy to reach.  Do not use oversized bedding.  Keep a telephone by your bedside.  Have a firm chair with side arms to use for getting dressed.  Remove throw rugs and tripping hazards from the floor. KITCHEN  Keep handles on pots and pans turned toward the center of the stove. Use back burners when possible.  Clean up spills quickly and allow time for drying.  Avoid walking on wet floors.  Avoid hot utensils and knives.  Position shelves so they are not too high or low.  Place commonly used objects within easy reach.  If necessary, use a sturdy step stool with a grab bar when reaching.  Keep electrical cables out of the way.  Do not use floor polish or wax that makes floors slippery. If you must use wax, use non-skid floor wax.  Remove throw rugs and tripping hazards from the floor. STAIRWAYS  Never leave objects on stairs.  Place handrails on both sides of stairways and use them. Fix any loose handrails. Make sure handrails on both sides of the stairways are as long as the stairs.  Check carpeting to make sure it is firmly attached along stairs. Make repairs  to worn or loose carpet promptly.  Avoid placing throw rugs at the top or bottom of stairways, or properly secure the rug with carpet tape to prevent slippage. Get rid of throw rugs, if possible.  Have an electrician put in a light switch at the top and bottom of the stairs. OTHER FALL PREVENTION TIPS  Wear low-heel or rubber-soled shoes that are supportive and fit well. Wear closed toe shoes.  When using a stepladder, make sure it  is fully opened and both spreaders are firmly locked. Do not climb a closed stepladder.  Add color or contrast paint or tape to grab bars and handrails in your home. Place contrasting color strips on first and last steps.  Learn and use mobility aids as needed. Install an electrical emergency response system.  Turn on lights to avoid dark areas. Replace light bulbs that burn out immediately. Get light switches that glow.  Arrange furniture to create clear pathways. Keep furniture in the same place.  Firmly attach carpet with non-skid or double-sided tape.  Eliminate uneven floor surfaces.  Select a carpet pattern that does not visually hide the edge of steps.  Be aware of all pets. OTHER HOME SAFETY TIPS  Set the water temperature for 120 F (48.8 C).  Keep emergency numbers on or near the telephone.  Keep smoke detectors on every level of the home and near sleeping areas. Document Released: 07/05/2002 Document Revised: 01/14/2012 Document Reviewed: 10/04/2011 [facility] Patient Information 2014 [place].

## 2019-01-07 ENCOUNTER — Telehealth: Payer: Self-pay | Admitting: Internal Medicine

## 2019-01-07 NOTE — Telephone Encounter (Signed)
-----   Message from Marcial Pacas, MD sent at 01/06/2019 12:17 PM EDT ----- Regarding: RE: Question about Gabapentin Dr. Wynetta Emery:  Thanks for keeping me updated. She denies significant benefit from gabapentin.  She is on polypharmacy treatment, complains of memory loss, confusion.   It is Ok for her to wean off Gabapentin.  Marcial Pacas, M.D. Ph.D.  [facility] [place], [place] [address] Phone: [phone] Fax:      [phone]    ----- Message ----- From: Ladell Pier, MD Sent: 01/06/2019   9:50 AM EDT To: Marcial Pacas, MD Subject: Question about Gabapentin                      Dr. Krista Blue:  good morning.  I am the PCP for this pt.  She tells that you recommend weaning her off Gabapentin.  I Just wanted to confirm as I did not see it mentioned in your note.   Thanks.

## 2019-01-07 NOTE — Telephone Encounter (Signed)
Contacted pt and lvm asking pt to give me a call at her earliest convenience

## 2019-01-08 NOTE — Telephone Encounter (Signed)
Contacted pt and made aware of Dr. Wynetta Emery message pt doesn't have any questions or concerns

## 2019-01-14 ENCOUNTER — Other Ambulatory Visit (INDEPENDENT_AMBULATORY_CARE_PROVIDER_SITE_OTHER): Payer: Self-pay | Admitting: Specialist

## 2019-01-14 DIAGNOSIS — G894 Chronic pain syndrome: Secondary | ICD-10-CM | POA: Diagnosis not present

## 2019-01-14 DIAGNOSIS — Z79891 Long term (current) use of opiate analgesic: Secondary | ICD-10-CM | POA: Diagnosis not present

## 2019-01-14 DIAGNOSIS — Z79899 Other long term (current) drug therapy: Secondary | ICD-10-CM | POA: Diagnosis not present

## 2019-01-14 DIAGNOSIS — M542 Cervicalgia: Secondary | ICD-10-CM | POA: Diagnosis not present

## 2019-01-15 ENCOUNTER — Telehealth: Payer: Self-pay | Admitting: Internal Medicine

## 2019-01-15 ENCOUNTER — Telehealth: Payer: Self-pay | Admitting: Specialist

## 2019-01-15 NOTE — Telephone Encounter (Signed)
Briana Williams with [employer] called asked for a call back concerning form he faxed over for a power wheelchair for the patient.  The number to contact Briana Williams is [phone]    Reference# is [id number]

## 2019-01-15 NOTE — Telephone Encounter (Signed)
Called the pt to discuss forms.

## 2019-01-19 NOTE — Telephone Encounter (Signed)
I called and advised that he has the paperwork but its not completed

## 2019-02-10 ENCOUNTER — Ambulatory Visit: Payer: Medicaid Other | Admitting: Specialist

## 2019-02-10 DIAGNOSIS — Z79891 Long term (current) use of opiate analgesic: Secondary | ICD-10-CM | POA: Diagnosis not present

## 2019-02-11 ENCOUNTER — Telehealth: Payer: Self-pay | Admitting: Specialist

## 2019-02-11 NOTE — Telephone Encounter (Signed)
Received call from [place] with [employer] needing status of the mobility exam and also needed to know if the paperwork was completed and when will it be faxed back to them. The number to contact Gerald Stabs is [phone]  Ref# is [id number]

## 2019-02-12 NOTE — Telephone Encounter (Signed)
Received call from [place] with [employer] needing status of the mobility exam and also needed to know if the paperwork was completed and when will it be faxed back to them. The number to contact Gerald Stabs is [phone]  Ref# is [id number]      Documentation

## 2019-02-24 ENCOUNTER — Telehealth: Payer: Self-pay | Admitting: Specialist

## 2019-02-24 NOTE — Telephone Encounter (Signed)
Chris from [facility] called requesting a status of the paperwork for the motorized wheelchair evaluation.  He stated that the paperwork expires on March 15, 2019.  CB#[phone].  Thank you.

## 2019-02-25 NOTE — Telephone Encounter (Signed)
Chris from [facility] called requesting a status of the paperwork for the motorized wheelchair evaluation.  He stated that the paperwork expires on March 15, 2019.

## 2019-03-03 DIAGNOSIS — Z79899 Other long term (current) drug therapy: Secondary | ICD-10-CM | POA: Diagnosis not present

## 2019-03-03 DIAGNOSIS — T50905A Adverse effect of unspecified drugs, medicaments and biological substances, initial encounter: Secondary | ICD-10-CM | POA: Diagnosis not present

## 2019-03-03 DIAGNOSIS — M542 Cervicalgia: Secondary | ICD-10-CM | POA: Diagnosis not present

## 2019-03-03 DIAGNOSIS — G894 Chronic pain syndrome: Secondary | ICD-10-CM | POA: Diagnosis not present

## 2019-03-03 DIAGNOSIS — Z79891 Long term (current) use of opiate analgesic: Secondary | ICD-10-CM | POA: Diagnosis not present

## 2019-03-08 ENCOUNTER — Telehealth: Payer: Self-pay | Admitting: Specialist

## 2019-03-08 ENCOUNTER — Other Ambulatory Visit: Payer: Self-pay

## 2019-03-08 DIAGNOSIS — M62838 Other muscle spasm: Secondary | ICD-10-CM

## 2019-03-08 MED ORDER — TIZANIDINE HCL 4 MG PO TABS: 4.0000 mg | ORAL_TABLET | Freq: Three times a day (TID) | ORAL | 0 refills | Status: DC | PRN

## 2019-03-08 NOTE — Telephone Encounter (Signed)
Briana Williams with [employer] called to follow up on the request for a power wheelchair. Gerald Stabs said the request will expire 03/15/2019. The number to contact Gerald Stabs is [phone]   Ref# [id number]

## 2019-03-11 ENCOUNTER — Encounter: Payer: Self-pay | Admitting: Neurology

## 2019-03-11 ENCOUNTER — Ambulatory Visit: Payer: Medicaid Other | Attending: Internal Medicine | Admitting: Physician Assistant

## 2019-03-11 ENCOUNTER — Ambulatory Visit: Payer: Medicaid Other | Admitting: Neurology

## 2019-03-11 ENCOUNTER — Other Ambulatory Visit: Payer: Self-pay

## 2019-03-11 VITALS — BP 168/92 | HR 83 | Temp 98.7°F | Ht <= 58 in | Wt 220.0 lb

## 2019-03-11 VITALS — BP 173/83 | HR 87 | Temp 98.0°F | Ht <= 58 in | Wt 220.0 lb

## 2019-03-11 DIAGNOSIS — R109 Unspecified abdominal pain: Secondary | ICD-10-CM

## 2019-03-11 DIAGNOSIS — E1159 Type 2 diabetes mellitus with other circulatory complications: Secondary | ICD-10-CM | POA: Insufficient documentation

## 2019-03-11 DIAGNOSIS — E785 Hyperlipidemia, unspecified: Secondary | ICD-10-CM | POA: Insufficient documentation

## 2019-03-11 DIAGNOSIS — G43709 Chronic migraine without aura, not intractable, without status migrainosus: Secondary | ICD-10-CM

## 2019-03-11 DIAGNOSIS — Z791 Long term (current) use of non-steroidal anti-inflammatories (NSAID): Secondary | ICD-10-CM | POA: Insufficient documentation

## 2019-03-11 DIAGNOSIS — Z9119 Patient's noncompliance with other medical treatment and regimen: Secondary | ICD-10-CM | POA: Insufficient documentation

## 2019-03-11 DIAGNOSIS — E1165 Type 2 diabetes mellitus with hyperglycemia: Secondary | ICD-10-CM | POA: Diagnosis not present

## 2019-03-11 DIAGNOSIS — G4730 Sleep apnea, unspecified: Secondary | ICD-10-CM | POA: Insufficient documentation

## 2019-03-11 DIAGNOSIS — M1990 Unspecified osteoarthritis, unspecified site: Secondary | ICD-10-CM | POA: Diagnosis not present

## 2019-03-11 DIAGNOSIS — Z888 Allergy status to other drugs, medicaments and biological substances status: Secondary | ICD-10-CM | POA: Insufficient documentation

## 2019-03-11 DIAGNOSIS — M419 Scoliosis, unspecified: Secondary | ICD-10-CM | POA: Diagnosis not present

## 2019-03-11 DIAGNOSIS — Z885 Allergy status to narcotic agent status: Secondary | ICD-10-CM | POA: Insufficient documentation

## 2019-03-11 DIAGNOSIS — N390 Urinary tract infection, site not specified: Secondary | ICD-10-CM | POA: Diagnosis not present

## 2019-03-11 DIAGNOSIS — Z79899 Other long term (current) drug therapy: Secondary | ICD-10-CM | POA: Diagnosis not present

## 2019-03-11 DIAGNOSIS — Z91048 Other nonmedicinal substance allergy status: Secondary | ICD-10-CM | POA: Insufficient documentation

## 2019-03-11 DIAGNOSIS — Z794 Long term (current) use of insulin: Secondary | ICD-10-CM | POA: Insufficient documentation

## 2019-03-11 DIAGNOSIS — IMO0002 Reserved for concepts with insufficient information to code with codable children: Secondary | ICD-10-CM

## 2019-03-11 LAB — POCT URINALYSIS DIP (CLINITEK)
Glucose, UA: 250 mg/dL — AB
Nitrite, UA: NEGATIVE
POC PROTEIN,UA: 30 — AB
Spec Grav, UA: 1.025 (ref 1.010–1.025)
Urobilinogen, UA: 0.2 E.U./dL
pH, UA: 5 (ref 5.0–8.0)

## 2019-03-11 LAB — POCT GLYCOSYLATED HEMOGLOBIN (HGB A1C): HbA1c, POC (controlled diabetic range): 10.2 % — AB (ref 0.0–7.0)

## 2019-03-11 LAB — GLUCOSE, POCT (MANUAL RESULT ENTRY): POC Glucose: 231 mg/dl — AB (ref 70–99)

## 2019-03-11 MED ORDER — FLUCONAZOLE 150 MG PO TABS: 150.0000 mg | ORAL_TABLET | Freq: Once | ORAL | 0 refills | Status: AC

## 2019-03-11 MED ORDER — ONDANSETRON 4 MG PO TBDP: 4.0000 mg | ORAL_TABLET | Freq: Three times a day (TID) | ORAL | 6 refills | Status: DC | PRN

## 2019-03-11 MED ORDER — TOPIRAMATE 50 MG PO TABS: 50.0000 mg | ORAL_TABLET | Freq: Two times a day (BID) | ORAL | 11 refills | Status: DC

## 2019-03-11 MED ORDER — TRAMADOL HCL 50 MG PO TABS: 50.0000 mg | ORAL_TABLET | Freq: Three times a day (TID) | ORAL | 0 refills | Status: AC | PRN

## 2019-03-11 MED ORDER — CEFTRIAXONE SODIUM 500 MG IJ SOLR
500.0000 mg | Freq: Once | INTRAMUSCULAR | Status: AC
  Administered 2019-03-11: 14:00:00 500 mg via INTRAMUSCULAR

## 2019-03-11 MED ORDER — NITROFURANTOIN MONOHYD MACRO 100 MG PO CAPS: 100.0000 mg | ORAL_CAPSULE | Freq: Two times a day (BID) | ORAL | 0 refills | Status: DC

## 2019-03-11 MED ORDER — SUMATRIPTAN SUCCINATE 100 MG PO TABS: 100.0000 mg | ORAL_TABLET | Freq: Once | ORAL | 11 refills | Status: DC | PRN

## 2019-03-11 MED FILL — traMADol HCL 50 MG TABS: 50 | 5 days supply | Qty: 15 | Fill #0

## 2019-03-11 MED FILL — FLUCONAZOLE 150 MG TABLET: 150 | 1 days supply | Qty: 1 | Fill #0

## 2019-03-11 MED FILL — NITROFURANTOIN MONO-MCR 100: 100 | 10 days supply | Qty: 20 | Fill #0

## 2019-03-11 NOTE — Progress Notes (Signed)
Patient ID: Briana Williams, female   DOB: [birth year], 38 y.o.   MRN: [id number]   Briana Williams, is a 38 y.o. female  GDJ:[id number]  QQI:[id number]  DOB - 03-21-1981  Subjective:  Chief Complaint and HPI: Briana Williams is a 38 y.o. female here today for R flank pain.  Tylenol and ibuprofen not helping.  C/o difficulty urinating and some burning with urination.  Pain is constant-not colicky.  Describes as an ache.  Present for about 5 days.  No fever.  Appetite is good.  No change in BM.  No abdominal pain.  admits to poor water intake.  No h/o kidney stones  Blood sugars "running about 200" at home.  However, A1C=10.2 today.  Admits to partial compliance with meds and doesn't follow diabetic diet.     ROS:   Constitutional:  No f/c, No night sweats, No unexplained weight loss. EENT:  No vision changes, No blurry vision, No hearing changes. No mouth, throat, or ear problems.  Respiratory: No cough, No SOB Cardiac: No CP, no palpitations GI:  No abd pain, No N/V/D. GU: see above Musculoskeletal: R flank pain Neuro: No headache, no dizziness, no motor weakness.  Skin: No rash Endocrine:  No polydipsia. No polyuria.  Psych: Denies SI/HI  No problems updated.  ALLERGIES: Allergies  Allergen Reactions  . Hydrocodone Itching    Note: benadryl does NOT help  . Metformin And Related Other (See Comments)    Causes blisters on skin  . Norvasc [Amlodipine Besylate] Swelling    Ankle swelling  . Ultrasound Gel Rash and Other (See Comments)    Patient gets a red rash with use of ultrasound gel/ burns skin    PAST MEDICAL HISTORY: Past Medical History:  Diagnosis Date  . Arthritis   . GERD (gastroesophageal reflux disease)   . Headache    migraine 2 weeks ago  . Hyperlipidemia   . Hypertension associated with diabetes ([place])   . Renal disorder    has both kidneys, compacted and 'squashed together"  . Scoliosis   . Sleep apnea    in the midst of being tested for  it.   waiting on scheduling   . Type 2 diabetes mellitus treated with insulin ([place])    dx 2000    MEDICATIONS AT HOME: Prior to Admission medications   Medication Sig Start Date End Date Taking? Authorizing Provider  ACCU-CHEK SOFTCLIX LANCETS lancets 1 each by Other route 3 (three) times daily. ICD 10 E11.9 09/21/15   [name], MD  albuterol (PROVENTIL HFA;VENTOLIN HFA) 108 (90 Base) MCG/ACT inhaler Inhale 1 puff into the lungs every 6 (six) hours as needed for wheezing or shortness of breath. 10/04/18   [name], MD  ALPRAZolam Duanne Moron) 0.25 MG tablet Take one tablet po at the MRI facility after signing in and filling paper work, May repeat x 1. 11/23/18   Jessy Oto, MD  amitriptyline (ELAVIL) 25 MG tablet Take 1 tablet (25 mg total) by mouth at bedtime. 12/03/18   Ladell Pier, MD  atorvastatin (LIPITOR) 10 MG tablet Take 1 tablet (10 mg total) by mouth daily. 09/15/18   Ladell Pier, MD  Blood Glucose Monitoring Suppl (ACCU-CHEK AVIVA PLUS) w/Device KIT 1 Device by Does not apply route 3 (three) times daily after meals. ICD 10 E11.9 09/21/15   [name], MD  carvedilol (COREG) 6.25 MG tablet Take 0.5 tablets (3.125 mg total) by mouth 2 (two) times daily with a meal. 12/03/18  Ladell Pier, MD  ciprofloxacin (CIPRO) 500 MG tablet Take 500 mg by mouth 2 (two) times daily.    [provider]  Continuous Blood Gluc Sensor (FREESTYLE LIBRE SENSOR SYSTEM) MISC Change sensor Q 2 wks 09/16/18   Ladell Pier, MD  diazepam (VALIUM) 5 MG tablet Take one valium tablet at the MRI after paperwork and may repeat if anxiety persists. 12/16/18   Jessy Oto, MD  Erenumab-aooe (AIMOVIG) 70 MG/ML SOAJ Inject 70 mg into the skin every 30 (thirty) days. 11/19/18   Marcial Pacas, MD  escitalopram (LEXAPRO) 20 MG tablet Take 1 tablet (20 mg total) by mouth daily. 09/15/18   Ladell Pier, MD  fluconazole (DIFLUCAN) 150 MG tablet Take 1 tablet (150 mg total) by  mouth once for 1 dose. 03/11/19 03/11/19  Argentina Donovan, PA-C  fluticasone (FLONASE) 50 MCG/ACT nasal spray Place 2 sprays into both nostrils daily. Patient not taking: Reported on 03/11/2019 08/19/17   Ladell Pier, MD  gabapentin (NEURONTIN) 100 MG capsule TAKE 3 CAPSULES(300 MG) BY MOUTH TWICE DAILY Patient not taking: Reported on 03/11/2019 01/06/19   Ladell Pier, MD  glucose blood (ACCU-CHEK AVIVA PLUS) test strip 1 each by Other route 3 (three) times daily. ICD 10 E11.9 09/21/15   [name], MD  hydrochlorothiazide (HYDRODIURIL) 25 MG tablet Take 1 tablet (25 mg total) by mouth daily. 09/15/18   Ladell Pier, MD  Insulin Glargine (LANTUS SOLOSTAR) 100 UNIT/ML Solostar Pen Inject 38 Units into the skin daily. 09/15/18   Ladell Pier, MD  Insulin Pen Needle (B-D ULTRAFINE III SHORT PEN) 31G X 8 MM MISC 1 application by Does not apply route daily. 01/02/17   [name], MD  Lancet Devices [facility]) lancets 1 each by Other route 3 (three) times daily. ICD 10 E11.9 09/21/15   Funches, Adriana Mccallum, MD  lisinopril (PRINIVIL,ZESTRIL) 40 MG tablet TAKE 1 TABLET BY MOUTH DAILY Patient taking differently: Take 40 mg by mouth daily.  09/16/18   Ladell Pier, MD  meloxicam (MOBIC) 15 MG tablet Take 15 mg by mouth daily.    [provider]  morphine (MSIR) 30 MG tablet Take 30 mg by mouth 3 (three) times daily as needed (pain).  07/24/18   [provider]  nitrofurantoin, macrocrystal-monohydrate, (MACROBID) 100 MG capsule Take 1 capsule (100 mg total) by mouth 2 (two) times daily. 03/11/19   Hector Venne, [name], PA-C  NOVOLOG FLEXPEN 100 UNIT/ML FlexPen INJECT 15 UNITS INTO THE SKIN WITH THE TWO LARGEST MEALS OF THE DAY. 11/27/18   Ladell Pier, MD  pantoprazole (PROTONIX) 40 MG tablet TAKE 1 TABLET(40 MG) BY MOUTH DAILY Patient not taking: Reported on 03/11/2019 12/04/18   Ladell Pier, MD  rizatriptan (MAXALT-MLT) 10 MG disintegrating  tablet Take 1 tablet (10 mg total) by mouth as needed. May repeat in 2 hours if needed 11/19/18   Marcial Pacas, MD  sitaGLIPtin (JANUVIA) 50 MG tablet Take 1 tablet (50 mg total) by mouth daily. 09/15/18   Ladell Pier, MD  tiZANidine (ZANAFLEX) 4 MG tablet Take 1 tablet (4 mg total) by mouth every 8 (eight) hours as needed for muscle spasms. 03/08/19   Ladell Pier, MD  traMADol (ULTRAM) 50 MG tablet Take 1 tablet (50 mg total) by mouth every 8 (eight) hours as needed for up to 5 days. 03/11/19 03/16/19  Argentina Donovan, PA-C  triamcinolone cream (KENALOG) 0.1 % Apply 1 application topically 2 (two)  times daily. 09/16/18   Ladell Pier, MD     Objective:  EXAM:   Vitals:   03/11/19 1358  BP: (!) 173/83  Pulse: 87  Temp: 98 F (36.7 C)  TempSrc: Oral  SpO2: 97%  Weight: 220 lb (99.8 kg)  Height: 4' 8" (1.422 m)    General appearance : A&OX3. NAD. Non-toxic-appearing HEENT: Atraumatic and Normocephalic.  PERRLA. EOM intact.  TM clear B. Mouth-MMM, post pharynx WNL w/o erythema, No PND. Neck: supple, no JVD. No cervical lymphadenopathy. No thyromegaly Chest/Lungs:  Breathing-non-labored, Good air entry bilaterally, breath sounds normal without rales, rhonchi, or wheezing  CVS: S1 S2 regular, no murmurs, gallops, rubs  Abdomen: Bowel sounds present, Non tender and not distended with no gaurding, rigidity or rebound. +R CVA TTP Extremities: Bilateral Lower Ext shows no edema, both legs are warm to touch with = pulse throughout Neurology:  CN II-XII grossly intact, Non focal.   Psych:  TP linear. J/I WNL. Normal speech. Appropriate eye contact and affect.  Skin:  No Rash  Data Review Lab Results  Component Value Date   HGBA1C 10.2 (A) 03/11/2019   HGBA1C 10.3 (A) 09/15/2018   HGBA1C 11.2 (A) 05/14/2018     Assessment & Plan   1. Uncontrolled type 2 diabetes mellitus with hyperglycemia (HCC) Uncontrolled and non-compliant.  Check blood sugars bid and record and  bring to next visit Compliance with meds and diet discussed at length.  She admits to only taking meds 50-75% of the time and doesn't follow diabetic diet.  Continue current regimen but compliance imperative - Glucose (CBG) - HgB A1c - POCT URINALYSIS DIP (CLINITEK)  2. Urinary tract infection without hematuria, site unspecified Will cover for pyelo due to flank pain.  Drink 80 ounces water daily.   - Urine Culture - cefTRIAXone (ROCEPHIN) injection 500 mg - nitrofurantoin, macrocrystal-monohydrate, (MACROBID) 100 MG capsule; Take 1 capsule (100 mg total) by mouth 2 (two) times daily.  Dispense: 20 capsule; Refill: 0 - fluconazole (DIFLUCAN) 150 MG tablet; Take 1 tablet (150 mg total) by mouth once for 1 dose.  Dispense: 1 tablet; Refill: 0  3. Flank pain Coverage for pyelo.   - Urine Culture - cefTRIAXone (ROCEPHIN) injection 500 mg - traMADol (ULTRAM) 50 MG tablet; Take 1 tablet (50 mg total) by mouth every 8 (eight) hours as needed for up to 5 days.  Dispense: 15 tablet; Refill: 0   Patient have been counseled extensively about nutrition and exercise  Return in about 3 months (around 06/11/2019) for with PCP.  The patient was given clear instructions to go to ER or return to medical center if symptoms don't improve, worsen or new problems develop. The patient verbalized understanding. The patient was told to call to get lab results if they haven't heard anything in the next week.     Freeman Caldron, PA-C [facility] [place], [place]   03/11/2019, 2:04 PM

## 2019-03-11 NOTE — Telephone Encounter (Signed)
Holding for Christy. 

## 2019-03-11 NOTE — Progress Notes (Signed)
PATIENT: Briana Williams DOB: 12/03/1980  Chief Complaint  Patient presents with  . Migraine    She is here with her husband, Grayland Ormond.  Reports 2-3 migraine days per week.  She uses OTC NSAIDS for her pain but typically has to lay down in a dark environment.  She would like to review her MRI results.  She has EEG pending on 04/12/2019.      HISTORICAL  Briana Williams is a 38 year old female, seen in request by orthopedic surgeon Dr. Basil Dess for evaluation of memory loss.  Initial evaluation was through virtual visit on November 19, 2018.  I have reviewed and summarized the referring note from the referring physician.   She had a past medical history of hypertension, diabetes, hyperlipidemia, chronic migraine headaches, had a history of severe scoliosis, had a Harrington rod placement at age 17, still has chronic low back pain, neck pain, later had multiple surgery due to rod infection in the past, is currently under pain management, on chronic narcotic treatment.  She lives at home with her family, is on the disability, baseline gait abnormality, require electronic wheelchair on long distance.  I reviewed hospital discharge summary on October 04, 2018, she was admitted for increased confusion, lethargy, falling asleep during conversation, it was considered due to polypharmacy treatment, recent increase of morphine dose to 30 mg 3 times a day, there was also a left great toe nonhealing ulcers, she was treated with antibiotics, overall she has much improved, but still has intermittent confusion episode.  She described difficulty finding her way around while driving,  She fell on October 26, 2018, she got up using bathroom, without any reasons on the floor, transient loss of consciousness, she was confused when she was awake, also complains of worsening neck pain, headaches,  I personally reviewed CT head without contrast on October 03, 2018: No acute abnormality.  She is currently on  polypharmacy treatment including pain management gabapentin 164m 3 tid, morphone 378mtid, lexpro 2090mtizanidine 60 mg 3 times daily, she denies significant benefit from gabapentin   She also has history of chronic migraine headaches, complains of worsening neck pain, headaches since her fall on October 26, 2018  UPDATE March 11 2019: She is accompanied by her husband at today's clinical visit, she continue complains of chronic low back pain, difficulty ambulating, also complains of worsening migraine headaches, right parietal occipital area severe pounding headache with associated light noise sensitivity, she was treated with aimovig since April, did not notice any significant difference, tried Maxalt without helping  She also complains of gradual worsening neck pain, gait abnormality, urinary and bowel incontinence  I personally reviewed MRI of the brain in May 2020, mild nonspecific supratentorium small vessel disease MRI of cervical spine: Posterior fusion C2 and 3, anterior fusion C3 and 6, severe C2 and 3, moderate C3 and 4 spinal stenosis with mass-effect on the spinal cord, MRI of thoracic spine, primary of thoracic spine posterior fusion without stenosis  She has pending appointment with neurosurgeon Dr. SteVertell Limberor potential cervical decompression surgery  She also reported recurrent episode of passing out spells, happened 6 times since March, only happen with standing up shortly, never happened in sitting down or standing position, lightheaded, then transient loss of consciousness, today there was no orthostatic blood pressure changes noted, sitting down blood pressure 172/92 heart rate of 87, standing up 168/91 heart rate of 90  REVIEW OF SYSTEMS: Full 14 system review of systems performed and  notable only for as above All other review of systems were negative.  ALLERGIES: Allergies  Allergen Reactions  . Hydrocodone Itching    Note: benadryl does NOT help  . Metformin And  Related Other (See Comments)    Causes blisters on skin  . Norvasc [Amlodipine Besylate] Swelling    Ankle swelling  . Ultrasound Gel Rash and Other (See Comments)    Patient gets a red rash with use of ultrasound gel/ burns skin    HOME MEDICATIONS: Current Outpatient Medications  Medication Sig Dispense Refill  . ACCU-CHEK SOFTCLIX LANCETS lancets 1 each by Other route 3 (three) times daily. ICD 10 E11.9 100 each 12  . albuterol (PROVENTIL HFA;VENTOLIN HFA) 108 (90 Base) MCG/ACT inhaler Inhale 1 puff into the lungs every 6 (six) hours as needed for wheezing or shortness of breath. 8 g 3  . amitriptyline (ELAVIL) 25 MG tablet Take 1 tablet (25 mg total) by mouth at bedtime. 30 tablet 6  . atorvastatin (LIPITOR) 10 MG tablet Take 1 tablet (10 mg total) by mouth daily. 90 tablet 3  . Blood Glucose Monitoring Suppl (ACCU-CHEK AVIVA PLUS) w/Device KIT 1 Device by Does not apply route 3 (three) times daily after meals. ICD 10 E11.9 1 kit 0  . carvedilol (COREG) 6.25 MG tablet Take 0.5 tablets (3.125 mg total) by mouth 2 (two) times daily with a meal. 60 tablet 6  . ciprofloxacin (CIPRO) 500 MG tablet Take 500 mg by mouth 2 (two) times daily.    . Continuous Blood Gluc Sensor (FREESTYLE LIBRE SENSOR SYSTEM) MISC Change sensor Q 2 wks 2 each 12  . diazepam (VALIUM) 5 MG tablet Take one valium tablet at the MRI after paperwork and may repeat if anxiety persists. 2 tablet 0  . Erenumab-aooe (AIMOVIG) 70 MG/ML SOAJ Inject 70 mg into the skin every 30 (thirty) days. 1 pen 11  . escitalopram (LEXAPRO) 20 MG tablet Take 1 tablet (20 mg total) by mouth daily. 90 tablet 2  . fluconazole (DIFLUCAN) 150 MG tablet Take 1 tablet (150 mg total) by mouth once for 1 dose. 1 tablet 0  . glucose blood (ACCU-CHEK AVIVA PLUS) test strip 1 each by Other route 3 (three) times daily. ICD 10 E11.9 100 each 12  . hydrochlorothiazide (HYDRODIURIL) 25 MG tablet Take 1 tablet (25 mg total) by mouth daily. 90 tablet 2  .  Insulin Glargine (LANTUS SOLOSTAR) 100 UNIT/ML Solostar Pen Inject 38 Units into the skin daily. 15 mL 11  . Insulin Pen Needle (B-D ULTRAFINE III SHORT PEN) 31G X 8 MM MISC 1 application by Does not apply route daily. 60 each 5  . Lancet Devices (ACCU-CHEK SOFTCLIX) lancets 1 each by Other route 3 (three) times daily. ICD 10 E11.9 1 each 0  . lisinopril (PRINIVIL,ZESTRIL) 40 MG tablet TAKE 1 TABLET BY MOUTH DAILY (Patient taking differently: Take 40 mg by mouth daily. ) 90 tablet 2  . meloxicam (MOBIC) 15 MG tablet Take 15 mg by mouth daily.    . nitrofurantoin, macrocrystal-monohydrate, (MACROBID) 100 MG capsule Take 1 capsule (100 mg total) by mouth 2 (two) times daily. 20 capsule 0  . NOVOLOG FLEXPEN 100 UNIT/ML FlexPen INJECT 15 UNITS INTO THE SKIN WITH THE TWO LARGEST MEALS OF THE DAY. 15 mL 2  . rizatriptan (MAXALT-MLT) 10 MG disintegrating tablet Take 1 tablet (10 mg total) by mouth as needed. May repeat in 2 hours if needed 15 tablet 6  . sitaGLIPtin (JANUVIA) 50 MG tablet Take  1 tablet (50 mg total) by mouth daily. 30 tablet 11  . tiZANidine (ZANAFLEX) 4 MG tablet Take 1 tablet (4 mg total) by mouth every 8 (eight) hours as needed for muscle spasms. 30 tablet 0  . traMADol (ULTRAM) 50 MG tablet Take 1 tablet (50 mg total) by mouth every 8 (eight) hours as needed for up to 5 days. 15 tablet 0  . triamcinolone cream (KENALOG) 0.1 % Apply 1 application topically 2 (two) times daily. 30 g 0   No current facility-administered medications for this visit.     PAST MEDICAL HISTORY: Past Medical History:  Diagnosis Date  . Arthritis   . GERD (gastroesophageal reflux disease)   . Headache    migraine 2 weeks ago  . Hyperlipidemia   . Hypertension associated with diabetes ([place])   . Renal disorder    has both kidneys, compacted and 'squashed together"  . Scoliosis   . Sleep apnea    in the midst of being tested for it.   waiting on scheduling   . Type 2 diabetes mellitus treated with  insulin ([place])    dx 2000    PAST SURGICAL HISTORY: Past Surgical History:  Procedure Laterality Date  . BACK SURGERY    . CERVICAL FUSION    . frontal closed sutures to forehead    . Harrington Rod placement    . HIP SURGERY     staph infection  . LUMBAR FUSION    . RADIOLOGY WITH ANESTHESIA N/A 02/25/2017   Procedure: RADIOLOGY WITH ANESTHESIA/MRI THORACIC SPINE AND LEFT SHOULDER WITHOUT CONTRAST.;  Surgeon: Radiologist, Medication, MD;  Location: [place];  Service: Radiology;  Laterality: N/A;    FAMILY HISTORY: Family History  Problem Relation Age of Onset  . Cancer Maternal Grandmother        mandibular cancer   . Diabetes Mother   . Heart failure Mother   . CAD Mother        triple CABG  . Diabetes Maternal Aunt   . Stroke Father     SOCIAL HISTORY: Social History   Socioeconomic History  . Marital status: Married    Spouse name: Not on file  . Number of children: 3  . Years of education: Not on file  . Highest education level: Not on file  Occupational History    Employer: UNEMPLOYED  Social Needs  . Financial resource strain: Not on file  . Food insecurity    Worry: Not on file    Inability: Not on file  . Transportation needs    Medical: Not on file    Non-medical: Not on file  Tobacco Use  . Smoking status: Never Smoker  . Smokeless tobacco: Never Used  Substance and Sexual Activity  . Alcohol use: No  . Drug use: No  . Sexual activity: Yes    Birth control/protection: Other-see comments    Comment: unable to have children d/t scoliosis  Lifestyle  . Physical activity    Days per week: Not on file    Minutes per session: Not on file  . Stress: Not on file  Relationships  . Social Herbalist on phone: Not on file    Gets together: Not on file    Attends religious service: Not on file    Active member of club or organization: Not on file    Attends meetings of clubs or organizations: Not on file    Relationship status: Not on file   .  Intimate partner violence    Fear of current or ex partner: Not on file    Emotionally abused: Not on file    Physically abused: Not on file    Forced sexual activity: Not on file  Other Topics Concern  . Not on file  Social History Narrative  . Not on file     PHYSICAL EXAM   Vitals:   03/11/19 1532  BP: (!) 168/92  Pulse: 83  Temp: 98.7 F (37.1 C)  Weight: 220 lb (99.8 kg)  Height: 4' 8"  (1.422 m)    Not recorded      Body mass index is 49.32 kg/m.  PHYSICAL EXAMNIATION:  Gen: NAD, conversant, well nourised, obese, well groomed                     Cardiovascular: Regular rate rhythm, no peripheral edema, warm, nontender. Eyes: Conjunctivae clear without exudates or hemorrhage Neck: Supple, no carotid bruits. Pulmonary: Clear to auscultation bilaterally   NEUROLOGICAL EXAM:  MENTAL STATUS: Speech:    Speech is normal; fluent and spontaneous with normal comprehension.  Cognition:     Orientation to time, place and person     Normal recent and remote memory     Normal Attention span and concentration     Normal Language, naming, repeating,spontaneous speech     Fund of knowledge   CRANIAL NERVES: CN II: Visual fields are full to confrontation.  Pupils are round equal and briskly reactive to light. CN III, IV, VI: extraocular movement are normal. No ptosis. CN V: Facial sensation is intact to pinprick in all 3 divisions bilaterally. Corneal responses are intact.  CN VII: Face is symmetric with normal eye closure and smile. CN VIII: Hearing is normal to rubbing fingers CN IX, X: Palate elevates symmetrically. Phonation is normal. CN XI: Head turning and shoulder shrug are intact CN XII: Tongue is midline with normal movements and no atrophy.  MOTOR: There is no pronator drift of out-stretched arms. Muscle bulk and tone are normal. Muscle strength is normal.  REFLEXES: Reflexes are 2+ and symmetric at the biceps, triceps, hyperreflexia bilateral knee  knees, and ankles. Plantar responses are flexor.  SENSORY: Intact to light touch  COORDINATION: Rapid alternating movements and fine finger movements are intact. There is no dysmetria on finger-to-nose and heel-knee-shin.    GAIT/STANCE: She needs push-up to get up from seated position, kyphosis, mildly unsteady  DIAGNOSTIC DATA (LABS, IMAGING, TESTING) - I reviewed patient records, labs, notes, testing and imaging myself where available.   ASSESSMENT AND PLAN  Briana Williams is a 38 y.o. female    Chronic migraine headaches  Continue Aimovig 70 mg monthly, Elavil 25 mg at bedtime, add on Topamax 50 mg twice a day,  She tried Maxalt without significant improvement, will try Imitrex 50 mg as needed   Cervical myelopathy  Severe stenosis at C2-3, moderate at C3-4  Follow-up with neurosurgeon Dr. Orie Rout, M.D. Ph.D.  [facility] [address], [place], [place] [address] Ph: [phone] Fax: [phone]  CC: Referring Provider

## 2019-03-11 NOTE — Patient Instructions (Signed)
Drink 80 ounces water daily. Go to the emergency department if you worsen.  Take all diabetes meds as directed.  Check blood sugar twice daily and record and bring to next visit.

## 2019-03-12 ENCOUNTER — Other Ambulatory Visit: Payer: Self-pay | Admitting: Internal Medicine

## 2019-03-12 DIAGNOSIS — M62838 Other muscle spasm: Secondary | ICD-10-CM

## 2019-03-12 NOTE — Telephone Encounter (Signed)
Chris with [employer] called to follow up on the request for a power wheelchair. Gerald Stabs said the request will expire 03/15/2019. The number to contact Gerald Stabs is [phone]   Ref# [id number]

## 2019-03-16 LAB — URINE CULTURE

## 2019-03-23 DIAGNOSIS — M4120 Other idiopathic scoliosis, site unspecified: Secondary | ICD-10-CM | POA: Diagnosis not present

## 2019-03-23 DIAGNOSIS — M4802 Spinal stenosis, cervical region: Secondary | ICD-10-CM | POA: Diagnosis not present

## 2019-03-23 DIAGNOSIS — M542 Cervicalgia: Secondary | ICD-10-CM | POA: Diagnosis not present

## 2019-03-23 DIAGNOSIS — G959 Disease of spinal cord, unspecified: Secondary | ICD-10-CM | POA: Diagnosis not present

## 2019-03-31 DIAGNOSIS — Z79891 Long term (current) use of opiate analgesic: Secondary | ICD-10-CM | POA: Diagnosis not present

## 2019-03-31 DIAGNOSIS — Z79899 Other long term (current) drug therapy: Secondary | ICD-10-CM | POA: Diagnosis not present

## 2019-03-31 DIAGNOSIS — M542 Cervicalgia: Secondary | ICD-10-CM | POA: Diagnosis not present

## 2019-03-31 DIAGNOSIS — G894 Chronic pain syndrome: Secondary | ICD-10-CM | POA: Diagnosis not present

## 2019-04-12 ENCOUNTER — Other Ambulatory Visit: Payer: Self-pay

## 2019-04-13 ENCOUNTER — Other Ambulatory Visit: Payer: Self-pay | Admitting: Internal Medicine

## 2019-04-13 DIAGNOSIS — M62838 Other muscle spasm: Secondary | ICD-10-CM

## 2019-04-20 ENCOUNTER — Other Ambulatory Visit: Payer: Self-pay | Admitting: Neurosurgery

## 2019-04-20 DIAGNOSIS — G959 Disease of spinal cord, unspecified: Secondary | ICD-10-CM

## 2019-04-27 ENCOUNTER — Ambulatory Visit
Admission: RE | Admit: 2019-04-27 | Discharge: 2019-04-27 | Disposition: A | Discharge status: Home / Self Care | Payer: Medicaid Other | Source: Ambulatory Visit | Attending: Neurosurgery | Admitting: Neurosurgery

## 2019-04-27 DIAGNOSIS — G959 Disease of spinal cord, unspecified: Secondary | ICD-10-CM | POA: Diagnosis not present

## 2019-04-27 DIAGNOSIS — R030 Elevated blood-pressure reading, without diagnosis of hypertension: Secondary | ICD-10-CM | POA: Diagnosis not present

## 2019-04-27 DIAGNOSIS — M4802 Spinal stenosis, cervical region: Secondary | ICD-10-CM | POA: Diagnosis not present

## 2019-04-27 DIAGNOSIS — S12121K Other nondisplaced dens fracture, subsequent encounter for fracture with nonunion: Secondary | ICD-10-CM | POA: Diagnosis not present

## 2019-04-27 DIAGNOSIS — Z6841 Body Mass Index (BMI) 40.0 and over, adult: Secondary | ICD-10-CM | POA: Diagnosis not present

## 2019-04-27 DIAGNOSIS — M4120 Other idiopathic scoliosis, site unspecified: Secondary | ICD-10-CM | POA: Diagnosis not present

## 2019-04-28 DIAGNOSIS — G894 Chronic pain syndrome: Secondary | ICD-10-CM | POA: Diagnosis not present

## 2019-04-28 DIAGNOSIS — Z79899 Other long term (current) drug therapy: Secondary | ICD-10-CM | POA: Diagnosis not present

## 2019-04-28 DIAGNOSIS — M542 Cervicalgia: Secondary | ICD-10-CM | POA: Diagnosis not present

## 2019-05-10 ENCOUNTER — Other Ambulatory Visit: Payer: Self-pay | Admitting: Internal Medicine

## 2019-05-10 DIAGNOSIS — M62838 Other muscle spasm: Secondary | ICD-10-CM

## 2019-05-26 DIAGNOSIS — M542 Cervicalgia: Secondary | ICD-10-CM | POA: Diagnosis not present

## 2019-05-26 DIAGNOSIS — Z79899 Other long term (current) drug therapy: Secondary | ICD-10-CM | POA: Diagnosis not present

## 2019-05-26 DIAGNOSIS — G894 Chronic pain syndrome: Secondary | ICD-10-CM | POA: Diagnosis not present

## 2019-06-04 ENCOUNTER — Other Ambulatory Visit: Payer: Self-pay | Admitting: Internal Medicine

## 2019-06-04 DIAGNOSIS — M62838 Other muscle spasm: Secondary | ICD-10-CM

## 2019-06-11 ENCOUNTER — Other Ambulatory Visit: Payer: Self-pay

## 2019-06-11 ENCOUNTER — Ambulatory Visit: Payer: Medicaid Other | Attending: Internal Medicine | Admitting: Internal Medicine

## 2019-06-11 DIAGNOSIS — S12100A Unspecified displaced fracture of second cervical vertebra, initial encounter for closed fracture: Secondary | ICD-10-CM | POA: Insufficient documentation

## 2019-06-11 DIAGNOSIS — G8929 Other chronic pain: Secondary | ICD-10-CM

## 2019-06-11 DIAGNOSIS — F419 Anxiety disorder, unspecified: Secondary | ICD-10-CM | POA: Diagnosis not present

## 2019-06-11 DIAGNOSIS — M545 Low back pain: Secondary | ICD-10-CM | POA: Diagnosis not present

## 2019-06-11 DIAGNOSIS — E119 Type 2 diabetes mellitus without complications: Secondary | ICD-10-CM | POA: Diagnosis not present

## 2019-06-11 DIAGNOSIS — D649 Anemia, unspecified: Secondary | ICD-10-CM | POA: Insufficient documentation

## 2019-06-11 DIAGNOSIS — G4700 Insomnia, unspecified: Secondary | ICD-10-CM

## 2019-06-11 DIAGNOSIS — Z794 Long term (current) use of insulin: Secondary | ICD-10-CM | POA: Diagnosis not present

## 2019-06-11 DIAGNOSIS — Z2821 Immunization not carried out because of patient refusal: Secondary | ICD-10-CM

## 2019-06-11 DIAGNOSIS — M62838 Other muscle spasm: Secondary | ICD-10-CM

## 2019-06-11 DIAGNOSIS — I10 Essential (primary) hypertension: Secondary | ICD-10-CM

## 2019-06-11 DIAGNOSIS — F329 Major depressive disorder, single episode, unspecified: Secondary | ICD-10-CM | POA: Diagnosis not present

## 2019-06-11 DIAGNOSIS — S12100S Unspecified displaced fracture of second cervical vertebra, sequela: Secondary | ICD-10-CM

## 2019-06-11 MED ORDER — CARVEDILOL 6.25 MG PO TABS: 3.1250 mg | ORAL_TABLET | Freq: Two times a day (BID) | ORAL | 6 refills | Status: DC

## 2019-06-11 MED ORDER — HYDROCHLOROTHIAZIDE 25 MG PO TABS: 25.0000 mg | ORAL_TABLET | Freq: Every day | ORAL | 2 refills | Status: DC

## 2019-06-11 MED ORDER — LISINOPRIL 40 MG PO TABS: 40.0000 mg | ORAL_TABLET | Freq: Every day | ORAL | 2 refills | Status: DC

## 2019-06-11 MED ORDER — AMITRIPTYLINE HCL 25 MG PO TABS: 25.0000 mg | ORAL_TABLET | Freq: Every day | ORAL | 6 refills | Status: DC

## 2019-06-11 MED ORDER — ESCITALOPRAM OXALATE 20 MG PO TABS: 20.0000 mg | ORAL_TABLET | Freq: Every day | ORAL | 2 refills | Status: DC

## 2019-06-11 MED ORDER — GABAPENTIN 100 MG PO CAPS: 100.0000 mg | ORAL_CAPSULE | Freq: Three times a day (TID) | ORAL | 0 refills | Status: DC

## 2019-06-11 MED ORDER — TIZANIDINE HCL 4 MG PO TABS: ORAL_TABLET | ORAL | 2 refills | Status: DC

## 2019-06-11 NOTE — Progress Notes (Signed)
Virtual Visit via Telephone Note Due to current restrictions/limitations of in-office visits due to the COVID-19 pandemic, this scheduled clinical appointment was converted to a telehealth visit  I connected with Briana Williams on 06/11/19 at 2:48 p.m by telephone and verified that I am speaking with the correct person using two identifiers. I am in my office.  The patient is at home.  Only the patient and myself participated in this encounter.  I discussed the limitations, risks, security and privacy concerns of performing an evaluation and management service by telephone and the availability of in person appointments. I also discussed with the patient that there may be a patient responsible charge related to this service. The patient expressed understanding and agreed to proceed.   History of Present Illness: Hx of scoliosis(multiple back surgeries), HTN, insomnia, DM2, depression, migraines. 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  Last eval 11/2018.  Purpose of today's visit is chronic ds management  Last spoke with pt 11/2018 after hosp for acute encephalopathy secondary to polypharmacy.  On that visit, she told me Dr. Krista Blue recommended weaning her off Neurontin.  I confirmed with Dr. Krista Blue and weaned her. Today, she is requesting to be restarted on Gabapentin.  Reports neurologist wants her back on Neurontin because of burning in LT foot and arm.   -Dr. Louanne Skye did MRI of c-spine in 03/2019 and found that she had a dens fracture. Spondylosis and spinal stenosis also noted.  Dr. Louanne Skye referred her to Dr. Vertell Limber who referred her to a specialist in [facility].  Await appt.    DM: checking BS BID BS before BF usually <150, after dinner highest has been 170 Taking Novolog 15 units with 2 largest meals, Lantus (she does not recall how many units she takes, should be 38 units) and Januvia Eating less and has loss some wgh.   HTN:  Compliant with meds.  BP good at pain specialist visits Limits salt in foods No  CP/SOB  C/o intermittent pain in RT side lower back Told she had kidney infection 02/2019 when eval by PA for same.  Never went away completely with treatment.  Wants to have kidneys check with lab test  Declines flu shot    Outpatient Encounter Medications as of 06/11/2019  Medication Sig  . ACCU-CHEK SOFTCLIX LANCETS lancets 1 each by Other route 3 (three) times daily. ICD 10 E11.9  . albuterol (PROVENTIL HFA;VENTOLIN HFA) 108 (90 Base) MCG/ACT inhaler Inhale 1 puff into the lungs every 6 (six) hours as needed for wheezing or shortness of breath.  Marland Kitchen amitriptyline (ELAVIL) 25 MG tablet Take 1 tablet (25 mg total) by mouth at bedtime.  Marland Kitchen atorvastatin (LIPITOR) 10 MG tablet Take 1 tablet (10 mg total) by mouth daily.  . Blood Glucose Monitoring Suppl (ACCU-CHEK AVIVA PLUS) w/Device KIT 1 Device by Does not apply route 3 (three) times daily after meals. ICD 10 E11.9  . carvedilol (COREG) 6.25 MG tablet Take 0.5 tablets (3.125 mg total) by mouth 2 (two) times daily with a meal.  . ciprofloxacin (CIPRO) 500 MG tablet Take 500 mg by mouth 2 (two) times daily.  . Continuous Blood Gluc Sensor (FREESTYLE LIBRE SENSOR SYSTEM) MISC Change sensor Q 2 wks  . diazepam (VALIUM) 5 MG tablet Take one valium tablet at the MRI after paperwork and may repeat if anxiety persists.  Eduard Roux (AIMOVIG) 70 MG/ML SOAJ Inject 70 mg into the skin every 30 (thirty) days.  Marland Kitchen escitalopram (LEXAPRO) 20 MG tablet Take 1 tablet (  20 mg total) by mouth daily.  Marland Kitchen glucose blood (ACCU-CHEK AVIVA PLUS) test strip 1 each by Other route 3 (three) times daily. ICD 10 E11.9  . hydrochlorothiazide (HYDRODIURIL) 25 MG tablet Take 1 tablet (25 mg total) by mouth daily.  . Insulin Glargine (LANTUS SOLOSTAR) 100 UNIT/ML Solostar Pen Inject 38 Units into the skin daily.  . Insulin Pen Needle (B-D ULTRAFINE III SHORT PEN) 31G X 8 MM MISC 1 application by Does not apply route daily.  Elmore Guise Devices (ACCU-CHEK SOFTCLIX) lancets 1 each  by Other route 3 (three) times daily. ICD 10 E11.9  . lisinopril (PRINIVIL,ZESTRIL) 40 MG tablet TAKE 1 TABLET BY MOUTH DAILY (Patient taking differently: Take 40 mg by mouth daily. )  . meloxicam (MOBIC) 15 MG tablet Take 15 mg by mouth daily.  . nitrofurantoin, macrocrystal-monohydrate, (MACROBID) 100 MG capsule Take 1 capsule (100 mg total) by mouth 2 (two) times daily. (Patient not taking: Reported on 06/11/2019)  . NOVOLOG FLEXPEN 100 UNIT/ML FlexPen INJECT 15 UNITS INTO THE SKIN WITH THE TWO LARGEST MEALS OF THE DAY.  Marland Kitchen ondansetron (ZOFRAN ODT) 4 MG disintegrating tablet Take 1 tablet (4 mg total) by mouth every 8 (eight) hours as needed.  . rizatriptan (MAXALT-MLT) 10 MG disintegrating tablet Take 1 tablet (10 mg total) by mouth as needed. May repeat in 2 hours if needed  . sitaGLIPtin (JANUVIA) 50 MG tablet Take 1 tablet (50 mg total) by mouth daily.  . SUMAtriptan (IMITREX) 100 MG tablet Take 1 tablet (100 mg total) by mouth once as needed for up to 1 dose for migraine. May repeat in 2 hours if headache persists or recurs.  Marland Kitchen tiZANidine (ZANAFLEX) 4 MG tablet TAKE 1 TABLET(4 MG) BY MOUTH EVERY 8 HOURS AS NEEDED FOR MUSCLE SPASMS  . topiramate (TOPAMAX) 50 MG tablet Take 1 tablet (50 mg total) by mouth 2 (two) times daily.  Marland Kitchen triamcinolone cream (KENALOG) 0.1 % Apply 1 application topically 2 (two) times daily.   No facility-administered encounter medications on file as of 06/11/2019.     Observations/Objective:  No direct observation do as this was a tele-visit  Lab Results  Component Value Date   WBC 5.6 10/04/2018   HGB 9.4 (L) 10/04/2018   HCT 29.0 (L) 10/04/2018   MCV 85.8 10/04/2018   PLT 193 10/04/2018    Assessment and Plan: 1. Type 2 diabetes mellitus without complication, with long-term current use of insulin (HCC) BS better.  Check A1C  Cont current meds.  Commended her on better eating habits - CBC; Future - Lipid panel; Future - Comprehensive metabolic panel;  Future - Hemoglobin A1c; Future  2. Essential hypertension Cont current meds and low salt diet - carvedilol (COREG) 6.25 MG tablet; Take 0.5 tablets (3.125 mg total) by mouth 2 (two) times daily with a meal.  Dispense: 60 tablet; Refill: 6 - hydrochlorothiazide (HYDRODIURIL) 25 MG tablet; Take 1 tablet (25 mg total) by mouth daily.  Dispense: 90 tablet; Refill: 2 - lisinopril (ZESTRIL) 40 MG tablet; Take 1 tablet (40 mg total) by mouth daily.  Dispense: 90 tablet; Refill: 2  3. Insomnia, unspecified type - amitriptyline (ELAVIL) 25 MG tablet; Take 1 tablet (25 mg total) by mouth at bedtime.  Dispense: 30 tablet; Refill: 6  4. Closed odontoid fracture, sequela Followed by spine specialist and referred to [facility]  5. Normochromic anemia - Iron, TIBC and Ferritin Panel; Future  6. Anxiety and depression - escitalopram (LEXAPRO) 20 MG tablet; Take 1 tablet (  20 mg total) by mouth daily.  Dispense: 90 tablet; Refill: 2  7. Muscle spasms of lower extremity, unspecified laterality - tiZANidine (ZANAFLEX) 4 MG tablet; TAKE 1 TABLET(4 MG) BY MOUTH EVERY 8 HOURS AS NEEDED FOR MUSCLE SPASMS  Dispense: 30 tablet; Refill: 2  8. Influenza vaccination declined   9. Chronic right-sided low back pain without sciatica    Follow Up Instructions: F/u in 4 mths   I discussed the assessment and treatment plan with the patient. The patient was provided an opportunity to ask questions and all were answered. The patient agreed with the plan and demonstrated an understanding of the instructions.   The patient was advised to call back or seek an in-person evaluation if the symptoms worsen or if the condition fails to improve as anticipated.  I provided 14  minutes of non-face-to-face time during this encounter.   Karle Plumber, MD

## 2019-06-11 NOTE — Progress Notes (Signed)
Pt is requesting a refill on gabapentin and insulin   Pt states her blood sugar this morning was 107

## 2019-06-23 DIAGNOSIS — M542 Cervicalgia: Secondary | ICD-10-CM | POA: Diagnosis not present

## 2019-06-23 DIAGNOSIS — Z9189 Other specified personal risk factors, not elsewhere classified: Secondary | ICD-10-CM | POA: Diagnosis not present

## 2019-06-23 DIAGNOSIS — G894 Chronic pain syndrome: Secondary | ICD-10-CM | POA: Diagnosis not present

## 2019-06-23 DIAGNOSIS — Z79899 Other long term (current) drug therapy: Secondary | ICD-10-CM | POA: Diagnosis not present

## 2019-07-07 ENCOUNTER — Other Ambulatory Visit: Payer: Self-pay | Admitting: Internal Medicine

## 2019-07-07 DIAGNOSIS — M62838 Other muscle spasm: Secondary | ICD-10-CM

## 2019-07-21 DIAGNOSIS — Z79899 Other long term (current) drug therapy: Secondary | ICD-10-CM | POA: Diagnosis not present

## 2019-07-21 DIAGNOSIS — G894 Chronic pain syndrome: Secondary | ICD-10-CM | POA: Diagnosis not present

## 2019-07-21 DIAGNOSIS — Z79891 Long term (current) use of opiate analgesic: Secondary | ICD-10-CM | POA: Diagnosis not present

## 2019-07-21 DIAGNOSIS — M542 Cervicalgia: Secondary | ICD-10-CM | POA: Diagnosis not present

## 2019-08-16 DIAGNOSIS — S12121K Other nondisplaced dens fracture, subsequent encounter for fracture with nonunion: Secondary | ICD-10-CM | POA: Diagnosis not present

## 2019-08-16 DIAGNOSIS — M4802 Spinal stenosis, cervical region: Secondary | ICD-10-CM | POA: Diagnosis not present

## 2019-08-16 DIAGNOSIS — M4120 Other idiopathic scoliosis, site unspecified: Secondary | ICD-10-CM | POA: Diagnosis not present

## 2019-08-16 DIAGNOSIS — G959 Disease of spinal cord, unspecified: Secondary | ICD-10-CM | POA: Diagnosis not present

## 2019-08-16 DIAGNOSIS — M542 Cervicalgia: Secondary | ICD-10-CM | POA: Diagnosis not present

## 2019-08-25 DIAGNOSIS — Z79899 Other long term (current) drug therapy: Secondary | ICD-10-CM | POA: Diagnosis not present

## 2019-08-25 DIAGNOSIS — G894 Chronic pain syndrome: Secondary | ICD-10-CM | POA: Diagnosis not present

## 2019-08-25 DIAGNOSIS — M542 Cervicalgia: Secondary | ICD-10-CM | POA: Diagnosis not present

## 2019-09-15 DIAGNOSIS — M542 Cervicalgia: Secondary | ICD-10-CM | POA: Diagnosis not present

## 2019-09-15 DIAGNOSIS — G894 Chronic pain syndrome: Secondary | ICD-10-CM | POA: Diagnosis not present

## 2019-09-15 DIAGNOSIS — Z79899 Other long term (current) drug therapy: Secondary | ICD-10-CM | POA: Diagnosis not present

## 2019-09-16 ENCOUNTER — Ambulatory Visit: Payer: Medicaid Other | Admitting: Neurology

## 2019-09-25 ENCOUNTER — Other Ambulatory Visit: Payer: Self-pay | Admitting: Internal Medicine

## 2019-09-25 DIAGNOSIS — M62838 Other muscle spasm: Secondary | ICD-10-CM

## 2019-10-06 ENCOUNTER — Telehealth: Payer: Self-pay

## 2019-10-06 NOTE — Telephone Encounter (Signed)
Pt contacted the office and stated that she is having 4 surgeries schedule with thin 2 weeks. Pt is requesting something for nerves.   Pt would like rx sent to [facility]

## 2019-10-07 MED ORDER — BUSPIRONE HCL 5 MG PO TABS: 5.0000 mg | ORAL_TABLET | Freq: Two times a day (BID) | ORAL | 0 refills | Status: DC

## 2019-10-07 NOTE — Telephone Encounter (Signed)
Contacted pt and made aware of rx being sent

## 2019-10-13 DIAGNOSIS — G709 Myoneural disorder, unspecified: Secondary | ICD-10-CM | POA: Diagnosis not present

## 2019-10-13 DIAGNOSIS — M4326 Fusion of spine, lumbar region: Secondary | ICD-10-CM | POA: Diagnosis not present

## 2019-10-13 DIAGNOSIS — Z4789 Encounter for other orthopedic aftercare: Secondary | ICD-10-CM | POA: Diagnosis not present

## 2019-10-13 DIAGNOSIS — M4322 Fusion of spine, cervical region: Secondary | ICD-10-CM | POA: Diagnosis not present

## 2019-10-13 DIAGNOSIS — M4185 Other forms of scoliosis, thoracolumbar region: Secondary | ICD-10-CM | POA: Diagnosis not present

## 2019-10-13 DIAGNOSIS — M1990 Unspecified osteoarthritis, unspecified site: Secondary | ICD-10-CM | POA: Diagnosis not present

## 2019-10-13 DIAGNOSIS — G4700 Insomnia, unspecified: Secondary | ICD-10-CM | POA: Diagnosis not present

## 2019-10-13 DIAGNOSIS — I10 Essential (primary) hypertension: Secondary | ICD-10-CM | POA: Diagnosis not present

## 2019-10-13 DIAGNOSIS — M4804 Spinal stenosis, thoracic region: Secondary | ICD-10-CM | POA: Diagnosis not present

## 2019-10-13 DIAGNOSIS — E119 Type 2 diabetes mellitus without complications: Secondary | ICD-10-CM | POA: Diagnosis not present

## 2019-10-13 DIAGNOSIS — F419 Anxiety disorder, unspecified: Secondary | ICD-10-CM | POA: Diagnosis not present

## 2019-10-13 DIAGNOSIS — M40202 Unspecified kyphosis, cervical region: Secondary | ICD-10-CM | POA: Diagnosis not present

## 2019-10-13 DIAGNOSIS — Z981 Arthrodesis status: Secondary | ICD-10-CM | POA: Diagnosis not present

## 2019-10-13 DIAGNOSIS — Z791 Long term (current) use of non-steroidal anti-inflammatories (NSAID): Secondary | ICD-10-CM | POA: Diagnosis not present

## 2019-10-13 DIAGNOSIS — M4802 Spinal stenosis, cervical region: Secondary | ICD-10-CM | POA: Diagnosis not present

## 2019-10-13 DIAGNOSIS — M4183 Other forms of scoliosis, cervicothoracic region: Secondary | ICD-10-CM | POA: Diagnosis not present

## 2019-10-13 DIAGNOSIS — M48061 Spinal stenosis, lumbar region without neurogenic claudication: Secondary | ICD-10-CM | POA: Diagnosis not present

## 2019-10-13 DIAGNOSIS — M419 Scoliosis, unspecified: Secondary | ICD-10-CM | POA: Diagnosis not present

## 2019-10-13 DIAGNOSIS — R159 Full incontinence of feces: Secondary | ICD-10-CM | POA: Diagnosis not present

## 2019-10-13 DIAGNOSIS — R32 Unspecified urinary incontinence: Secondary | ICD-10-CM | POA: Diagnosis not present

## 2019-10-13 DIAGNOSIS — Z794 Long term (current) use of insulin: Secondary | ICD-10-CM | POA: Diagnosis not present

## 2019-10-13 DIAGNOSIS — M5001 Cervical disc disorder with myelopathy,  high cervical region: Secondary | ICD-10-CM | POA: Diagnosis not present

## 2019-10-13 DIAGNOSIS — S12120K Other displaced dens fracture, subsequent encounter for fracture with nonunion: Secondary | ICD-10-CM | POA: Diagnosis not present

## 2019-10-21 ENCOUNTER — Other Ambulatory Visit: Payer: Self-pay

## 2019-10-21 ENCOUNTER — Ambulatory Visit: Payer: Medicaid Other | Attending: Internal Medicine | Admitting: Internal Medicine

## 2019-10-21 DIAGNOSIS — M62838 Other muscle spasm: Secondary | ICD-10-CM

## 2019-10-21 DIAGNOSIS — F329 Major depressive disorder, single episode, unspecified: Secondary | ICD-10-CM

## 2019-10-21 DIAGNOSIS — G4700 Insomnia, unspecified: Secondary | ICD-10-CM | POA: Diagnosis not present

## 2019-10-21 DIAGNOSIS — F32A Depression, unspecified: Secondary | ICD-10-CM

## 2019-10-21 DIAGNOSIS — Z794 Long term (current) use of insulin: Secondary | ICD-10-CM | POA: Diagnosis not present

## 2019-10-21 DIAGNOSIS — M4120 Other idiopathic scoliosis, site unspecified: Secondary | ICD-10-CM

## 2019-10-21 DIAGNOSIS — E119 Type 2 diabetes mellitus without complications: Secondary | ICD-10-CM

## 2019-10-21 DIAGNOSIS — F419 Anxiety disorder, unspecified: Secondary | ICD-10-CM

## 2019-10-21 DIAGNOSIS — I10 Essential (primary) hypertension: Secondary | ICD-10-CM | POA: Diagnosis not present

## 2019-10-21 NOTE — Progress Notes (Signed)
Virtual Visit via Telephone Note Due to current restrictions/limitations of in-office visits due to the COVID-19 pandemic, this scheduled clinical appointment was converted to a telehealth visit I connected with Briana Williams on 10/21/19 at  2:10 PM EDT by telephone and verified that I am speaking with the correct person using two identifiers.   I am in my office.  The patient is at home.  Only the patient and myself participated in this encounter.  I discussed the limitations, risks, security and privacy concerns of performing an evaluation and management service by telephone and the availability of in person appointments. I also discussed with the patient that there may be a patient responsible charge related to this service. The patient expressed understanding and agreed to proceed.   History of Present Illness: Hx of scoliosis(multiple back surgeries), HTN, insomnia, DM2, depression, migraines. 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 Last visit 05/2019  Cervical myelopathy: Patient will be having extensive surgery on her neck through [facility] on 4/29 and 11/29/2019.  Will be in hosp for 2 wks. she tells me she was told by the neurosurgeon that she may need to have a feeding tube for time.  After the surgery. -She is very anxious about the upcoming surgeries.  States she was told by the surgeon that one of the hardware in her neck is located very near or  through a blood vessel and that this may have contributed to her blackout spells. -Is worried about being away from her husband and her 2 sons who are 4 and 73 years old.  She is also worried about her husband's job loss. She is wanting something different to take for her nerves because she feels the BuSpar does not help.  I started her on a low-dose of 5 mg twice a day. -She would also like to know whether I would sign off on her getting a [employer] motorized chair.  She has one that is 40 years old and not functioning right anymore.  She states that  the orthopedics Dr. Louanne Skye and also the orthopedics through [facility] suggested that she gets it updated.  Due to the issues with her neck and significant curvature of her spine she has a difficult time getting around.  She states that she hurts so bad that she can barely walk.  She is still followed by the pain specialist in [facility].    DM:  BS 80-140s in mornings, lower after dinner On Lantus 40 daily , and Novolog 15 units with the 2 largest meals of the day Doing better with eating habits Labs were ordered on her last visit with me but she did not come to the lab to have them done.  She plans to come next week.  HTN:  BP good.  She tells me that blood pressures checked once a month when she sees a pain specialist and it has been good.  She does not recall the numbers specifically. Limiting salt in her food No CP/SOB   Outpatient Encounter Medications as of 10/21/2019  Medication Sig  . albuterol (PROVENTIL HFA;VENTOLIN HFA) 108 (90 Base) MCG/ACT inhaler Inhale 1 puff into the lungs every 6 (six) hours as needed for wheezing or shortness of breath.  Marland Kitchen amitriptyline (ELAVIL) 25 MG tablet Take 1 tablet (25 mg total) by mouth at bedtime.  Marland Kitchen atorvastatin (LIPITOR) 10 MG tablet Take 1 tablet (10 mg total) by mouth daily.  . carvedilol (COREG) 6.25 MG tablet Take 0.5 tablets (3.125 mg total) by  mouth 2 (two) times daily with a meal.  . Erenumab-aooe (AIMOVIG) 70 MG/ML SOAJ Inject 70 mg into the skin every 30 (thirty) days.  Marland Kitchen escitalopram (LEXAPRO) 20 MG tablet Take 1 tablet (20 mg total) by mouth daily.  Marland Kitchen gabapentin (NEURONTIN) 100 MG capsule Take 1 capsule (100 mg total) by mouth 3 (three) times daily.  . hydrochlorothiazide (HYDRODIURIL) 25 MG tablet Take 1 tablet (25 mg total) by mouth daily.  . Insulin Glargine (LANTUS SOLOSTAR) 100 UNIT/ML Solostar Pen Inject 38 Units into the skin daily.  Marland Kitchen lisinopril (ZESTRIL) 40 MG tablet Take 1 tablet (40 mg total) by mouth daily.  Marland Kitchen NOVOLOG FLEXPEN 100  UNIT/ML FlexPen INJECT 15 UNITS INTO THE SKIN WITH THE TWO LARGEST MEALS OF THE DAY.  Marland Kitchen ondansetron (ZOFRAN ODT) 4 MG disintegrating tablet Take 1 tablet (4 mg total) by mouth every 8 (eight) hours as needed.  . sitaGLIPtin (JANUVIA) 50 MG tablet Take 1 tablet (50 mg total) by mouth daily.  . SUMAtriptan (IMITREX) 100 MG tablet Take 1 tablet (100 mg total) by mouth once as needed for up to 1 dose for migraine. May repeat in 2 hours if headache persists or recurs.  Marland Kitchen tiZANidine (ZANAFLEX) 4 MG tablet TAKE 1 TABLET(4 MG) BY MOUTH EVERY 8 HOURS AS NEEDED FOR MUSCLE SPASMS  . topiramate (TOPAMAX) 50 MG tablet Take 1 tablet (50 mg total) by mouth 2 (two) times daily.  Marland Kitchen ACCU-CHEK SOFTCLIX LANCETS lancets 1 each by Other route 3 (three) times daily. ICD 10 E11.9  . Blood Glucose Monitoring Suppl (ACCU-CHEK AVIVA PLUS) w/Device KIT 1 Device by Does not apply route 3 (three) times daily after meals. ICD 10 E11.9  . busPIRone (BUSPAR) 5 MG tablet Take 1 tablet (5 mg total) by mouth 2 (two) times daily. (Patient not taking: Reported on 10/21/2019)  . Continuous Blood Gluc Sensor (FREESTYLE LIBRE SENSOR SYSTEM) MISC Change sensor Q 2 wks  . glucose blood (ACCU-CHEK AVIVA PLUS) test strip 1 each by Other route 3 (three) times daily. ICD 10 E11.9  . Insulin Pen Needle (B-D ULTRAFINE III SHORT PEN) 31G X 8 MM MISC 1 application by Does not apply route daily.  Elmore Guise Devices (ACCU-CHEK SOFTCLIX) lancets 1 each by Other route 3 (three) times daily. ICD 10 E11.9  . meloxicam (MOBIC) 15 MG tablet Take 15 mg by mouth daily.  Marland Kitchen triamcinolone cream (KENALOG) 0.1 % Apply 1 application topically 2 (two) times daily.   No facility-administered encounter medications on file as of 10/21/2019.    Observations/Objective: No direct observation done as this is a telephone encounter  Assessment and Plan: 1. Type 2 diabetes mellitus without complication, with long-term current use of insulin ([place]) Reported morning blood  sugars are acceptable. Advised patient to come to the lab to have her A1c checked.  Discussed the importance of good blood sugars control prior to her surgery as uncontrolled diabetes can cause poor wound healing.  2. Essential hypertension Reports that blood pressures have been good.  She will continue current blood pressure medications and low-salt diet.  Told her that the goal is for blood pressure to be 130/80 or lower.  3. Anxiety and depression Recommend increasing the BuSpar to 10 mg 3 times a day to help with the anxiety that she is experiencing. - escitalopram (LEXAPRO) 20 MG tablet; Take 1 tablet (20 mg total) by mouth daily.  Dispense: 90 tablet; Refill: 2 - busPIRone (BUSPAR) 10 MG tablet; Take 1 tablet (10 mg total)  by mouth 3 (three) times daily.  Dispense: 90 tablet; Refill: 1  4. SCOLIOSIS Followed by Ortho and neurosurgery.  Plan for major surgery on the neck in a few weeks In regards for her requests for the [facility] chair, I advised patient that it has to be an in person visit for full evaluation.  She will schedule that appointment  5. Muscle spasms of lower extremity, unspecified laterality - tiZANidine (ZANAFLEX) 4 MG tablet; 1 tablet every 8 hours as needed.  Dispense: 30 tablet; Refill: 1   Follow Up Instructions:    I discussed the assessment and treatment plan with the patient. The patient was provided an opportunity to ask questions and all were answered. The patient agreed with the plan and demonstrated an understanding of the instructions.   The patient was advised to call back or seek an in-person evaluation if the symptoms worsen or if the condition fails to improve as anticipated.  I provided 22 minutes of non-face-to-face time during this encounter.   Karle Plumber, MD

## 2019-10-21 NOTE — Progress Notes (Signed)
Pt states the Buspar doesn't work for her. Pt states the closer she gets to her surgery date she is more anxious   Refilles Needed: -Liptor -Lantus  -Januvia

## 2019-10-22 MED ORDER — BUSPIRONE HCL 10 MG PO TABS: 10.0000 mg | ORAL_TABLET | Freq: Three times a day (TID) | ORAL | 1 refills | Status: DC

## 2019-10-22 MED ORDER — GABAPENTIN 100 MG PO CAPS: 100.0000 mg | ORAL_CAPSULE | Freq: Three times a day (TID) | ORAL | 1 refills | Status: DC

## 2019-10-22 MED ORDER — TIZANIDINE HCL 4 MG PO TABS: ORAL_TABLET | ORAL | 1 refills | Status: DC

## 2019-10-22 MED ORDER — ESCITALOPRAM OXALATE 20 MG PO TABS: 20.0000 mg | ORAL_TABLET | Freq: Every day | ORAL | 2 refills | Status: DC

## 2019-10-26 ENCOUNTER — Other Ambulatory Visit: Payer: Medicaid Other

## 2019-11-10 DIAGNOSIS — Z01818 Encounter for other preprocedural examination: Secondary | ICD-10-CM | POA: Diagnosis not present

## 2019-11-10 DIAGNOSIS — G8929 Other chronic pain: Secondary | ICD-10-CM | POA: Diagnosis not present

## 2019-11-10 DIAGNOSIS — E785 Hyperlipidemia, unspecified: Secondary | ICD-10-CM | POA: Diagnosis not present

## 2019-11-10 DIAGNOSIS — R0683 Snoring: Secondary | ICD-10-CM | POA: Diagnosis not present

## 2019-11-10 DIAGNOSIS — Z79899 Other long term (current) drug therapy: Secondary | ICD-10-CM | POA: Diagnosis not present

## 2019-11-10 DIAGNOSIS — E11649 Type 2 diabetes mellitus with hypoglycemia without coma: Secondary | ICD-10-CM | POA: Diagnosis not present

## 2019-11-10 DIAGNOSIS — M40202 Unspecified kyphosis, cervical region: Secondary | ICD-10-CM | POA: Diagnosis not present

## 2019-11-23 ENCOUNTER — Other Ambulatory Visit: Payer: Self-pay | Admitting: Pharmacist

## 2019-11-23 DIAGNOSIS — M62838 Other muscle spasm: Secondary | ICD-10-CM

## 2019-11-23 MED ORDER — TIZANIDINE HCL 4 MG PO TABS: ORAL_TABLET | ORAL | 1 refills | Status: DC

## 2019-11-26 DIAGNOSIS — Z01812 Encounter for preprocedural laboratory examination: Secondary | ICD-10-CM | POA: Diagnosis not present

## 2019-11-26 DIAGNOSIS — Z20822 Contact with and (suspected) exposure to covid-19: Secondary | ICD-10-CM | POA: Diagnosis not present

## 2019-11-29 DIAGNOSIS — M40202 Unspecified kyphosis, cervical region: Secondary | ICD-10-CM | POA: Diagnosis not present

## 2019-11-29 DIAGNOSIS — G43909 Migraine, unspecified, not intractable, without status migrainosus: Secondary | ICD-10-CM | POA: Diagnosis not present

## 2019-11-29 DIAGNOSIS — Q675 Congenital deformity of spine: Secondary | ICD-10-CM | POA: Diagnosis not present

## 2019-11-29 DIAGNOSIS — M4183 Other forms of scoliosis, cervicothoracic region: Secondary | ICD-10-CM | POA: Diagnosis not present

## 2019-11-29 DIAGNOSIS — Z794 Long term (current) use of insulin: Secondary | ICD-10-CM | POA: Diagnosis not present

## 2019-11-29 DIAGNOSIS — E119 Type 2 diabetes mellitus without complications: Secondary | ICD-10-CM | POA: Diagnosis not present

## 2019-11-29 DIAGNOSIS — Z9889 Other specified postprocedural states: Secondary | ICD-10-CM | POA: Diagnosis not present

## 2019-11-29 DIAGNOSIS — M4712 Other spondylosis with myelopathy, cervical region: Secondary | ICD-10-CM | POA: Diagnosis not present

## 2019-11-29 DIAGNOSIS — I10 Essential (primary) hypertension: Secondary | ICD-10-CM | POA: Diagnosis not present

## 2019-11-29 DIAGNOSIS — M4802 Spinal stenosis, cervical region: Secondary | ICD-10-CM | POA: Diagnosis not present

## 2019-11-29 DIAGNOSIS — R110 Nausea: Secondary | ICD-10-CM | POA: Diagnosis not present

## 2019-11-30 DIAGNOSIS — Q675 Congenital deformity of spine: Secondary | ICD-10-CM | POA: Diagnosis not present

## 2019-11-30 DIAGNOSIS — G43909 Migraine, unspecified, not intractable, without status migrainosus: Secondary | ICD-10-CM | POA: Diagnosis not present

## 2019-11-30 DIAGNOSIS — I10 Essential (primary) hypertension: Secondary | ICD-10-CM | POA: Diagnosis not present

## 2019-11-30 DIAGNOSIS — R110 Nausea: Secondary | ICD-10-CM | POA: Diagnosis not present

## 2019-11-30 DIAGNOSIS — Z794 Long term (current) use of insulin: Secondary | ICD-10-CM | POA: Diagnosis not present

## 2019-11-30 DIAGNOSIS — E1165 Type 2 diabetes mellitus with hyperglycemia: Secondary | ICD-10-CM | POA: Diagnosis not present

## 2019-12-01 DIAGNOSIS — E1165 Type 2 diabetes mellitus with hyperglycemia: Secondary | ICD-10-CM | POA: Diagnosis not present

## 2019-12-01 DIAGNOSIS — S12100K Unspecified displaced fracture of second cervical vertebra, subsequent encounter for fracture with nonunion: Secondary | ICD-10-CM | POA: Diagnosis not present

## 2019-12-01 DIAGNOSIS — E119 Type 2 diabetes mellitus without complications: Secondary | ICD-10-CM | POA: Diagnosis not present

## 2019-12-01 DIAGNOSIS — F419 Anxiety disorder, unspecified: Secondary | ICD-10-CM | POA: Diagnosis not present

## 2019-12-01 DIAGNOSIS — Z794 Long term (current) use of insulin: Secondary | ICD-10-CM | POA: Diagnosis not present

## 2019-12-01 DIAGNOSIS — M5003 Cervical disc disorder with myelopathy, cervicothoracic region: Secondary | ICD-10-CM | POA: Diagnosis not present

## 2019-12-01 DIAGNOSIS — I10 Essential (primary) hypertension: Secondary | ICD-10-CM | POA: Diagnosis not present

## 2019-12-01 DIAGNOSIS — Z4789 Encounter for other orthopedic aftercare: Secondary | ICD-10-CM | POA: Diagnosis not present

## 2019-12-01 DIAGNOSIS — Q675 Congenital deformity of spine: Secondary | ICD-10-CM | POA: Diagnosis not present

## 2019-12-01 DIAGNOSIS — F329 Major depressive disorder, single episode, unspecified: Secondary | ICD-10-CM | POA: Diagnosis not present

## 2019-12-01 DIAGNOSIS — R52 Pain, unspecified: Secondary | ICD-10-CM | POA: Diagnosis not present

## 2019-12-01 DIAGNOSIS — G4700 Insomnia, unspecified: Secondary | ICD-10-CM | POA: Diagnosis not present

## 2019-12-02 DIAGNOSIS — Z79891 Long term (current) use of opiate analgesic: Secondary | ICD-10-CM | POA: Diagnosis not present

## 2019-12-02 DIAGNOSIS — D62 Acute posthemorrhagic anemia: Secondary | ICD-10-CM | POA: Diagnosis not present

## 2019-12-02 DIAGNOSIS — G8918 Other acute postprocedural pain: Secondary | ICD-10-CM | POA: Diagnosis not present

## 2019-12-02 DIAGNOSIS — Z794 Long term (current) use of insulin: Secondary | ICD-10-CM | POA: Diagnosis not present

## 2019-12-02 DIAGNOSIS — G992 Myelopathy in diseases classified elsewhere: Secondary | ICD-10-CM | POA: Diagnosis not present

## 2019-12-02 DIAGNOSIS — Q675 Congenital deformity of spine: Secondary | ICD-10-CM | POA: Diagnosis not present

## 2019-12-02 DIAGNOSIS — E1165 Type 2 diabetes mellitus with hyperglycemia: Secondary | ICD-10-CM | POA: Diagnosis not present

## 2019-12-02 DIAGNOSIS — M419 Scoliosis, unspecified: Secondary | ICD-10-CM | POA: Diagnosis not present

## 2019-12-02 DIAGNOSIS — R7989 Other specified abnormal findings of blood chemistry: Secondary | ICD-10-CM | POA: Diagnosis not present

## 2019-12-02 DIAGNOSIS — D649 Anemia, unspecified: Secondary | ICD-10-CM | POA: Diagnosis not present

## 2019-12-03 DIAGNOSIS — G8918 Other acute postprocedural pain: Secondary | ICD-10-CM | POA: Diagnosis not present

## 2019-12-03 DIAGNOSIS — M4183 Other forms of scoliosis, cervicothoracic region: Secondary | ICD-10-CM | POA: Diagnosis not present

## 2019-12-03 DIAGNOSIS — M4802 Spinal stenosis, cervical region: Secondary | ICD-10-CM | POA: Diagnosis not present

## 2019-12-03 DIAGNOSIS — M419 Scoliosis, unspecified: Secondary | ICD-10-CM | POA: Diagnosis not present

## 2019-12-03 DIAGNOSIS — G992 Myelopathy in diseases classified elsewhere: Secondary | ICD-10-CM | POA: Diagnosis not present

## 2019-12-03 DIAGNOSIS — D649 Anemia, unspecified: Secondary | ICD-10-CM | POA: Diagnosis not present

## 2019-12-03 DIAGNOSIS — Z79891 Long term (current) use of opiate analgesic: Secondary | ICD-10-CM | POA: Diagnosis not present

## 2019-12-03 DIAGNOSIS — G43909 Migraine, unspecified, not intractable, without status migrainosus: Secondary | ICD-10-CM | POA: Diagnosis not present

## 2019-12-03 DIAGNOSIS — R7989 Other specified abnormal findings of blood chemistry: Secondary | ICD-10-CM | POA: Diagnosis not present

## 2019-12-03 DIAGNOSIS — D62 Acute posthemorrhagic anemia: Secondary | ICD-10-CM | POA: Diagnosis not present

## 2019-12-03 DIAGNOSIS — R0789 Other chest pain: Secondary | ICD-10-CM | POA: Diagnosis not present

## 2019-12-03 DIAGNOSIS — M4712 Other spondylosis with myelopathy, cervical region: Secondary | ICD-10-CM | POA: Diagnosis not present

## 2019-12-03 DIAGNOSIS — S12112A Nondisplaced Type II dens fracture, initial encounter for closed fracture: Secondary | ICD-10-CM | POA: Diagnosis not present

## 2019-12-03 DIAGNOSIS — Q675 Congenital deformity of spine: Secondary | ICD-10-CM | POA: Diagnosis not present

## 2019-12-04 DIAGNOSIS — M419 Scoliosis, unspecified: Secondary | ICD-10-CM | POA: Diagnosis not present

## 2019-12-04 DIAGNOSIS — Z794 Long term (current) use of insulin: Secondary | ICD-10-CM | POA: Diagnosis not present

## 2019-12-04 DIAGNOSIS — Z79891 Long term (current) use of opiate analgesic: Secondary | ICD-10-CM | POA: Diagnosis not present

## 2019-12-04 DIAGNOSIS — E1165 Type 2 diabetes mellitus with hyperglycemia: Secondary | ICD-10-CM | POA: Diagnosis not present

## 2019-12-04 DIAGNOSIS — G992 Myelopathy in diseases classified elsewhere: Secondary | ICD-10-CM | POA: Diagnosis not present

## 2019-12-04 DIAGNOSIS — Z5189 Encounter for other specified aftercare: Secondary | ICD-10-CM | POA: Diagnosis not present

## 2019-12-04 DIAGNOSIS — G8918 Other acute postprocedural pain: Secondary | ICD-10-CM | POA: Diagnosis not present

## 2019-12-04 DIAGNOSIS — I499 Cardiac arrhythmia, unspecified: Secondary | ICD-10-CM | POA: Diagnosis not present

## 2019-12-05 DIAGNOSIS — G992 Myelopathy in diseases classified elsewhere: Secondary | ICD-10-CM | POA: Diagnosis not present

## 2019-12-05 DIAGNOSIS — M419 Scoliosis, unspecified: Secondary | ICD-10-CM | POA: Diagnosis not present

## 2019-12-05 DIAGNOSIS — G8918 Other acute postprocedural pain: Secondary | ICD-10-CM | POA: Diagnosis not present

## 2019-12-05 DIAGNOSIS — E1165 Type 2 diabetes mellitus with hyperglycemia: Secondary | ICD-10-CM | POA: Diagnosis not present

## 2019-12-05 DIAGNOSIS — L680 Hirsutism: Secondary | ICD-10-CM | POA: Diagnosis not present

## 2019-12-05 DIAGNOSIS — L659 Nonscarring hair loss, unspecified: Secondary | ICD-10-CM | POA: Diagnosis not present

## 2019-12-05 DIAGNOSIS — Z794 Long term (current) use of insulin: Secondary | ICD-10-CM | POA: Diagnosis not present

## 2019-12-05 DIAGNOSIS — Z79891 Long term (current) use of opiate analgesic: Secondary | ICD-10-CM | POA: Diagnosis not present

## 2019-12-06 DIAGNOSIS — G992 Myelopathy in diseases classified elsewhere: Secondary | ICD-10-CM | POA: Diagnosis not present

## 2019-12-06 DIAGNOSIS — T380X5A Adverse effect of glucocorticoids and synthetic analogues, initial encounter: Secondary | ICD-10-CM | POA: Diagnosis not present

## 2019-12-06 DIAGNOSIS — M5021 Other cervical disc displacement,  high cervical region: Secondary | ICD-10-CM | POA: Diagnosis not present

## 2019-12-06 DIAGNOSIS — Z794 Long term (current) use of insulin: Secondary | ICD-10-CM | POA: Diagnosis not present

## 2019-12-06 DIAGNOSIS — L680 Hirsutism: Secondary | ICD-10-CM | POA: Diagnosis not present

## 2019-12-06 DIAGNOSIS — G8918 Other acute postprocedural pain: Secondary | ICD-10-CM | POA: Diagnosis not present

## 2019-12-06 DIAGNOSIS — E1165 Type 2 diabetes mellitus with hyperglycemia: Secondary | ICD-10-CM | POA: Diagnosis not present

## 2019-12-06 DIAGNOSIS — Z79891 Long term (current) use of opiate analgesic: Secondary | ICD-10-CM | POA: Diagnosis not present

## 2019-12-06 DIAGNOSIS — M4802 Spinal stenosis, cervical region: Secondary | ICD-10-CM | POA: Diagnosis not present

## 2019-12-06 DIAGNOSIS — M419 Scoliosis, unspecified: Secondary | ICD-10-CM | POA: Diagnosis not present

## 2019-12-06 DIAGNOSIS — L659 Nonscarring hair loss, unspecified: Secondary | ICD-10-CM | POA: Diagnosis not present

## 2019-12-07 DIAGNOSIS — Z794 Long term (current) use of insulin: Secondary | ICD-10-CM | POA: Diagnosis not present

## 2019-12-07 DIAGNOSIS — G992 Myelopathy in diseases classified elsewhere: Secondary | ICD-10-CM | POA: Diagnosis not present

## 2019-12-07 DIAGNOSIS — L680 Hirsutism: Secondary | ICD-10-CM | POA: Diagnosis not present

## 2019-12-07 DIAGNOSIS — G8918 Other acute postprocedural pain: Secondary | ICD-10-CM | POA: Diagnosis not present

## 2019-12-07 DIAGNOSIS — Z79891 Long term (current) use of opiate analgesic: Secondary | ICD-10-CM | POA: Diagnosis not present

## 2019-12-07 DIAGNOSIS — T380X5A Adverse effect of glucocorticoids and synthetic analogues, initial encounter: Secondary | ICD-10-CM | POA: Diagnosis not present

## 2019-12-07 DIAGNOSIS — M419 Scoliosis, unspecified: Secondary | ICD-10-CM | POA: Diagnosis not present

## 2019-12-07 DIAGNOSIS — E1165 Type 2 diabetes mellitus with hyperglycemia: Secondary | ICD-10-CM | POA: Diagnosis not present

## 2019-12-08 DIAGNOSIS — Z794 Long term (current) use of insulin: Secondary | ICD-10-CM | POA: Diagnosis not present

## 2019-12-08 DIAGNOSIS — L680 Hirsutism: Secondary | ICD-10-CM | POA: Diagnosis not present

## 2019-12-08 DIAGNOSIS — M419 Scoliosis, unspecified: Secondary | ICD-10-CM | POA: Diagnosis not present

## 2019-12-08 DIAGNOSIS — G992 Myelopathy in diseases classified elsewhere: Secondary | ICD-10-CM | POA: Diagnosis not present

## 2019-12-08 DIAGNOSIS — G8918 Other acute postprocedural pain: Secondary | ICD-10-CM | POA: Diagnosis not present

## 2019-12-08 DIAGNOSIS — T380X5A Adverse effect of glucocorticoids and synthetic analogues, initial encounter: Secondary | ICD-10-CM | POA: Diagnosis not present

## 2019-12-08 DIAGNOSIS — E1165 Type 2 diabetes mellitus with hyperglycemia: Secondary | ICD-10-CM | POA: Diagnosis not present

## 2019-12-08 DIAGNOSIS — Z79891 Long term (current) use of opiate analgesic: Secondary | ICD-10-CM | POA: Diagnosis not present

## 2019-12-09 ENCOUNTER — Other Ambulatory Visit: Payer: Self-pay | Admitting: Neurology

## 2019-12-09 DIAGNOSIS — L680 Hirsutism: Secondary | ICD-10-CM | POA: Diagnosis not present

## 2019-12-09 DIAGNOSIS — Z794 Long term (current) use of insulin: Secondary | ICD-10-CM | POA: Diagnosis not present

## 2019-12-09 DIAGNOSIS — G992 Myelopathy in diseases classified elsewhere: Secondary | ICD-10-CM | POA: Diagnosis not present

## 2019-12-09 DIAGNOSIS — Z79891 Long term (current) use of opiate analgesic: Secondary | ICD-10-CM | POA: Diagnosis not present

## 2019-12-09 DIAGNOSIS — E1165 Type 2 diabetes mellitus with hyperglycemia: Secondary | ICD-10-CM | POA: Diagnosis not present

## 2019-12-09 DIAGNOSIS — G8918 Other acute postprocedural pain: Secondary | ICD-10-CM | POA: Diagnosis not present

## 2019-12-09 DIAGNOSIS — T380X5A Adverse effect of glucocorticoids and synthetic analogues, initial encounter: Secondary | ICD-10-CM | POA: Diagnosis not present

## 2019-12-09 DIAGNOSIS — M419 Scoliosis, unspecified: Secondary | ICD-10-CM | POA: Diagnosis not present

## 2019-12-10 DIAGNOSIS — E1165 Type 2 diabetes mellitus with hyperglycemia: Secondary | ICD-10-CM | POA: Diagnosis not present

## 2019-12-10 DIAGNOSIS — Z7409 Other reduced mobility: Secondary | ICD-10-CM | POA: Diagnosis not present

## 2019-12-10 DIAGNOSIS — T380X5A Adverse effect of glucocorticoids and synthetic analogues, initial encounter: Secondary | ICD-10-CM | POA: Diagnosis not present

## 2019-12-10 DIAGNOSIS — M4120 Other idiopathic scoliosis, site unspecified: Secondary | ICD-10-CM | POA: Diagnosis not present

## 2019-12-14 DIAGNOSIS — G894 Chronic pain syndrome: Secondary | ICD-10-CM | POA: Diagnosis not present

## 2019-12-14 DIAGNOSIS — M542 Cervicalgia: Secondary | ICD-10-CM | POA: Diagnosis not present

## 2019-12-14 DIAGNOSIS — Z79899 Other long term (current) drug therapy: Secondary | ICD-10-CM | POA: Diagnosis not present

## 2019-12-18 DIAGNOSIS — R109 Unspecified abdominal pain: Secondary | ICD-10-CM | POA: Diagnosis not present

## 2019-12-18 DIAGNOSIS — G8918 Other acute postprocedural pain: Secondary | ICD-10-CM | POA: Diagnosis not present

## 2019-12-18 DIAGNOSIS — Z20822 Contact with and (suspected) exposure to covid-19: Secondary | ICD-10-CM | POA: Diagnosis not present

## 2019-12-18 DIAGNOSIS — M40202 Unspecified kyphosis, cervical region: Secondary | ICD-10-CM | POA: Diagnosis not present

## 2019-12-18 DIAGNOSIS — L539 Erythematous condition, unspecified: Secondary | ICD-10-CM | POA: Diagnosis not present

## 2019-12-20 DIAGNOSIS — Z79891 Long term (current) use of opiate analgesic: Secondary | ICD-10-CM | POA: Diagnosis not present

## 2019-12-20 DIAGNOSIS — E1165 Type 2 diabetes mellitus with hyperglycemia: Secondary | ICD-10-CM | POA: Diagnosis not present

## 2019-12-20 DIAGNOSIS — Z794 Long term (current) use of insulin: Secondary | ICD-10-CM | POA: Diagnosis not present

## 2019-12-20 DIAGNOSIS — M4120 Other idiopathic scoliosis, site unspecified: Secondary | ICD-10-CM | POA: Diagnosis not present

## 2019-12-20 DIAGNOSIS — G8918 Other acute postprocedural pain: Secondary | ICD-10-CM | POA: Diagnosis not present

## 2019-12-28 ENCOUNTER — Other Ambulatory Visit: Payer: Self-pay

## 2019-12-28 DIAGNOSIS — G4700 Insomnia, unspecified: Secondary | ICD-10-CM

## 2019-12-28 MED ORDER — AMITRIPTYLINE HCL 25 MG PO TABS: 25.0000 mg | ORAL_TABLET | Freq: Every day | ORAL | 6 refills | Status: DC

## 2019-12-28 NOTE — Telephone Encounter (Signed)
Pt contacted the office and is requesting a refill on sleep medicine

## 2020-01-04 DIAGNOSIS — Z09 Encounter for follow-up examination after completed treatment for conditions other than malignant neoplasm: Secondary | ICD-10-CM | POA: Diagnosis not present

## 2020-01-04 DIAGNOSIS — Z981 Arthrodesis status: Secondary | ICD-10-CM | POA: Diagnosis not present

## 2020-01-04 DIAGNOSIS — S12100S Unspecified displaced fracture of second cervical vertebra, sequela: Secondary | ICD-10-CM | POA: Diagnosis not present

## 2020-01-04 DIAGNOSIS — M4120 Other idiopathic scoliosis, site unspecified: Secondary | ICD-10-CM | POA: Diagnosis not present

## 2020-01-10 ENCOUNTER — Other Ambulatory Visit: Payer: Self-pay | Admitting: Pharmacist

## 2020-01-10 DIAGNOSIS — M62838 Other muscle spasm: Secondary | ICD-10-CM

## 2020-01-10 MED ORDER — TIZANIDINE HCL 4 MG PO TABS: ORAL_TABLET | ORAL | 1 refills | Status: DC

## 2020-01-14 DIAGNOSIS — M79602 Pain in left arm: Secondary | ICD-10-CM | POA: Diagnosis not present

## 2020-01-14 DIAGNOSIS — E119 Type 2 diabetes mellitus without complications: Secondary | ICD-10-CM | POA: Diagnosis not present

## 2020-01-14 DIAGNOSIS — M79622 Pain in left upper arm: Secondary | ICD-10-CM | POA: Diagnosis not present

## 2020-01-14 DIAGNOSIS — R531 Weakness: Secondary | ICD-10-CM | POA: Diagnosis not present

## 2020-01-14 DIAGNOSIS — R200 Anesthesia of skin: Secondary | ICD-10-CM | POA: Diagnosis not present

## 2020-01-14 DIAGNOSIS — Z09 Encounter for follow-up examination after completed treatment for conditions other than malignant neoplasm: Secondary | ICD-10-CM | POA: Diagnosis not present

## 2020-01-14 DIAGNOSIS — E785 Hyperlipidemia, unspecified: Secondary | ICD-10-CM | POA: Diagnosis not present

## 2020-01-14 DIAGNOSIS — R2232 Localized swelling, mass and lump, left upper limb: Secondary | ICD-10-CM | POA: Diagnosis not present

## 2020-01-14 DIAGNOSIS — I10 Essential (primary) hypertension: Secondary | ICD-10-CM | POA: Diagnosis not present

## 2020-01-14 DIAGNOSIS — Z79899 Other long term (current) drug therapy: Secondary | ICD-10-CM | POA: Diagnosis not present

## 2020-01-14 DIAGNOSIS — M7989 Other specified soft tissue disorders: Secondary | ICD-10-CM | POA: Diagnosis not present

## 2020-01-14 DIAGNOSIS — Z981 Arthrodesis status: Secondary | ICD-10-CM | POA: Diagnosis not present

## 2020-01-14 DIAGNOSIS — Z794 Long term (current) use of insulin: Secondary | ICD-10-CM | POA: Diagnosis not present

## 2020-02-14 ENCOUNTER — Other Ambulatory Visit: Payer: Self-pay | Admitting: Internal Medicine

## 2020-02-16 DIAGNOSIS — I10 Essential (primary) hypertension: Secondary | ICD-10-CM | POA: Diagnosis not present

## 2020-02-16 DIAGNOSIS — G8918 Other acute postprocedural pain: Secondary | ICD-10-CM | POA: Diagnosis not present

## 2020-02-16 DIAGNOSIS — M79602 Pain in left arm: Secondary | ICD-10-CM | POA: Diagnosis not present

## 2020-02-16 DIAGNOSIS — G8929 Other chronic pain: Secondary | ICD-10-CM | POA: Diagnosis not present

## 2020-02-16 DIAGNOSIS — E119 Type 2 diabetes mellitus without complications: Secondary | ICD-10-CM | POA: Diagnosis not present

## 2020-02-16 DIAGNOSIS — Z794 Long term (current) use of insulin: Secondary | ICD-10-CM | POA: Diagnosis not present

## 2020-02-16 DIAGNOSIS — M542 Cervicalgia: Secondary | ICD-10-CM | POA: Diagnosis not present

## 2020-02-16 DIAGNOSIS — Z981 Arthrodesis status: Secondary | ICD-10-CM | POA: Diagnosis not present

## 2020-02-16 DIAGNOSIS — G43909 Migraine, unspecified, not intractable, without status migrainosus: Secondary | ICD-10-CM | POA: Diagnosis not present

## 2020-02-16 DIAGNOSIS — G4700 Insomnia, unspecified: Secondary | ICD-10-CM | POA: Diagnosis not present

## 2020-02-21 ENCOUNTER — Other Ambulatory Visit: Payer: Self-pay | Admitting: Internal Medicine

## 2020-02-21 DIAGNOSIS — M62838 Other muscle spasm: Secondary | ICD-10-CM

## 2020-02-21 NOTE — Telephone Encounter (Signed)
Requested medication (s) are due for refill today: no  Requested medication (s) are on the active medication list: yes  Last refill:  01/29/2020  Future visit scheduled: no  Notes to clinic:  this refill cannot be delegated    Requested Prescriptions  Pending Prescriptions Disp Refills   tiZANidine (ZANAFLEX) 4 MG tablet [Pharmacy Med Name: TIZANIDINE 4MG  TABLETS] 30 tablet 1    Sig: TAKE 1 TABLET BY MOUTH EVERY 8 HOURS AS NEEDED      Not Delegated - Cardiovascular:  Alpha-2 Agonists - tizanidine Failed - 02/21/2020  2:43 PM      Failed - This refill cannot be delegated      Passed - Valid encounter within last 6 months    Recent Outpatient Visits           4 months ago Type 2 diabetes mellitus without complication, with long-term current use of insulin ([place])   [place], [name], MD   8 months ago Type 2 diabetes mellitus without complication, with long-term current use of insulin ([place])   [place] Karle Plumber B, MD   11 months ago Uncontrolled type 2 diabetes mellitus with hyperglycemia [facility])   [place], [place], [place]   1 year ago Uncontrolled type 2 diabetes mellitus without complication, with long-term current use of insulin [facility])   [place] [facility] Karle Plumber B, MD   1 year ago Uncontrolled type 2 diabetes mellitus without complication, with long-term current use of insulin [facility])   [place] [facility] Ladell Pier, MD

## 2020-03-16 ENCOUNTER — Other Ambulatory Visit: Payer: Medicaid Other

## 2020-03-16 ENCOUNTER — Other Ambulatory Visit: Payer: Self-pay

## 2020-03-16 DIAGNOSIS — Z20822 Contact with and (suspected) exposure to covid-19: Secondary | ICD-10-CM

## 2020-03-17 ENCOUNTER — Telehealth: Payer: Self-pay

## 2020-03-17 ENCOUNTER — Other Ambulatory Visit: Payer: Self-pay | Admitting: Internal Medicine

## 2020-03-17 DIAGNOSIS — F419 Anxiety disorder, unspecified: Secondary | ICD-10-CM

## 2020-03-17 DIAGNOSIS — F329 Major depressive disorder, single episode, unspecified: Secondary | ICD-10-CM

## 2020-03-17 LAB — SARS-COV-2, NAA 2 DAY TAT

## 2020-03-17 LAB — NOVEL CORONAVIRUS, NAA: SARS-CoV-2, NAA: DETECTED — AB

## 2020-03-17 MED ORDER — BUSPIRONE HCL 10 MG PO TABS: 10.0000 mg | ORAL_TABLET | Freq: Three times a day (TID) | ORAL | 1 refills | Status: DC

## 2020-03-17 NOTE — Telephone Encounter (Signed)
Pt contacted the office and stated she is needing something for her nerves. Pt states her husband is in the hospital and she doesn't know how to deal with everything. [facility] would [facility]

## 2020-03-18 ENCOUNTER — Telehealth: Payer: Self-pay | Admitting: Unknown Physician Specialty

## 2020-03-18 NOTE — Telephone Encounter (Signed)
Called to discuss with [name] about Covid symptoms and the use of bamlanivimab, a monoclonal antibody infusion for those with mild to moderate Covid symptoms and at a high risk of hospitalization.      Pt does not qualify for infusion therapy as she has asymptomatic infection. Isolation precautions discussed. Advised to contact back for consideration should she develop symptoms. Patient verbalized understanding.      Patient Active Problem List   Diagnosis Date Noted  . Closed fracture of odontoid process of axis ([place]) 06/11/2019  . Normochromic anemia 06/11/2019  . Confusion 11/19/2018  . Chronic migraine 11/19/2018  . Acute encephalopathy 10/03/2018  . AKI (acute kidney injury) ([place]) 10/03/2018  . Diabetic foot ulcer ([place]) 09/16/2018  . Diabetic foot infection ([place]) 07/12/2018  . Morbid obesity ([place]) 07/12/2018  . Toe infection 07/11/2018  . Muscle spasm of back 04/24/2017  . Osteoarthritis of both hips 01/15/2017  . Recurrent headache 01/06/2017  . Irregular menstrual cycle 05/30/2016  . Chronic pain syndrome 09/21/2015  . HYPERTRIGLYCERIDEMIA 05/02/2009  . 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 Insomnia 05/01/2009  . SCOLIOSIS 05/18/2008  . Diabetes type 2, uncontrolled ([place]) 04/18/2008  . DEPRESSION 04/18/2008  . Essential hypertension, benign 04/18/2008

## 2020-03-19 ENCOUNTER — Encounter: Payer: Self-pay | Admitting: Internal Medicine

## 2020-03-21 ENCOUNTER — Other Ambulatory Visit: Payer: Self-pay | Admitting: Internal Medicine

## 2020-03-21 DIAGNOSIS — M62838 Other muscle spasm: Secondary | ICD-10-CM

## 2020-03-21 NOTE — Telephone Encounter (Signed)
Requested medication (s) are due for refill today -unsure  Requested medication (s) are on the active medication list -yes  Future visit scheduled -yes  Last refill: 02/21/20  Notes to clinic: Request for non delegated Rx  Requested Prescriptions  Pending Prescriptions Disp Refills   tiZANidine (ZANAFLEX) 4 MG tablet [Pharmacy Med Name: TIZANIDINE 4MG  TABLETS] 30 tablet 0    Sig: TAKE 1 TABLET BY MOUTH EVERY 8 HOURS AS NEEDED      Not Delegated - Cardiovascular:  Alpha-2 Agonists - tizanidine Failed - 03/21/2020 11:42 AM      Failed - This refill cannot be delegated      Passed - Valid encounter within last 6 months    Recent Outpatient Visits           5 months ago Type 2 diabetes mellitus without complication, with long-term current use of insulin ([place])   [name], [name], MD   9 months ago Type 2 diabetes mellitus without complication, with long-term current use of insulin ([place])   [name], MD   1 year ago Uncontrolled type 2 diabetes mellitus with hyperglycemia [facility])   [name], [name], [place]   1 year ago Uncontrolled type 2 diabetes mellitus without complication, with long-term current use of insulin ([name])   [place], [name], MD   1 year ago Uncontrolled type 2 diabetes mellitus without complication, with long-term current use of insulin ([name])   [name], MD       Future Appointments             In 1 week [name], [name], PA-C [place]                Requested Prescriptions  Pending Prescriptions Disp Refills   tiZANidine (ZANAFLEX) 4 MG tablet [Pharmacy Med Name: TIZANIDINE 4MG  TABLETS] 30 tablet 0    Sig: TAKE 1 TABLET BY MOUTH EVERY 8 HOURS AS NEEDED      Not Delegated - Cardiovascular:   Alpha-2 Agonists - tizanidine Failed - 03/21/2020 11:42 AM      Failed - This refill cannot be delegated      Passed - Valid encounter within last 6 months    Recent Outpatient Visits           5 months ago Type 2 diabetes mellitus without complication, with long-term current use of insulin ([name])   [name], [name], MD   9 months ago Type 2 diabetes mellitus without complication, with long-term current use of insulin ([name])   [name] Karle Plumber B, MD   1 year ago Uncontrolled type 2 diabetes mellitus with hyperglycemia [facility])   [name], [name], [place]   1 year ago Uncontrolled type 2 diabetes mellitus without complication, with long-term current use of insulin [facility])   [name], MD   1 year ago Uncontrolled type 2 diabetes mellitus without complication, with long-term current use of insulin [facility])   [name], MD       Future Appointments             In 1 week [place], [place] [facility]

## 2020-03-22 NOTE — Telephone Encounter (Signed)
Contacted pt and made aware that rx has been sent  

## 2020-03-30 ENCOUNTER — Ambulatory Visit: Payer: Medicaid Other | Admitting: Internal Medicine

## 2020-04-11 DIAGNOSIS — G894 Chronic pain syndrome: Secondary | ICD-10-CM | POA: Diagnosis not present

## 2020-04-11 DIAGNOSIS — Z9189 Other specified personal risk factors, not elsewhere classified: Secondary | ICD-10-CM | POA: Diagnosis not present

## 2020-04-11 DIAGNOSIS — M542 Cervicalgia: Secondary | ICD-10-CM | POA: Diagnosis not present

## 2020-04-11 DIAGNOSIS — Z79899 Other long term (current) drug therapy: Secondary | ICD-10-CM | POA: Diagnosis not present

## 2020-04-12 ENCOUNTER — Other Ambulatory Visit: Payer: Self-pay | Admitting: Internal Medicine

## 2020-04-12 ENCOUNTER — Encounter: Payer: Self-pay | Admitting: Internal Medicine

## 2020-04-12 DIAGNOSIS — Z981 Arthrodesis status: Secondary | ICD-10-CM

## 2020-04-12 DIAGNOSIS — M4120 Other idiopathic scoliosis, site unspecified: Secondary | ICD-10-CM

## 2020-04-12 NOTE — Progress Notes (Signed)
Received form for [employer]. Pt completed surgery on her neck at [facility] since last visit with me. Will refer to P.T for mobility chair eval.

## 2020-04-12 NOTE — Progress Notes (Signed)
Contacted pt and went over Dr. Wynetta Emery message pt states she is okay with the referral being placed

## 2020-04-12 NOTE — Progress Notes (Signed)
Physical therapy referral 

## 2020-04-16 ENCOUNTER — Other Ambulatory Visit: Payer: Self-pay | Admitting: Internal Medicine

## 2020-04-16 NOTE — Telephone Encounter (Signed)
Requested Prescriptions  Pending Prescriptions Disp Refills  . gabapentin (NEURONTIN) 100 MG capsule [Pharmacy Med Name: GABAPENTIN 100MG  CAPSULES] 90 capsule 1    Sig: TAKE 1 CAPSULE(100 MG) BY MOUTH THREE TIMES DAILY     Neurology: Anticonvulsants - gabapentin Passed - 04/16/2020 12:36 PM      Passed - Valid encounter within last 12 months    Recent Outpatient Visits          5 months ago Type 2 diabetes mellitus without complication, with long-term current use of insulin ([place])   [place], [name], MD   10 months ago Type 2 diabetes mellitus without complication, with long-term current use of insulin ([place])   [place] Karle Plumber B, MD   1 year ago Uncontrolled type 2 diabetes mellitus with hyperglycemia [facility])   [place], [place], [place]   1 year ago Uncontrolled type 2 diabetes mellitus without complication, with long-term current use of insulin [facility])   [facility] Karle Plumber B, MD   1 year ago Uncontrolled type 2 diabetes mellitus without complication, with long-term current use of insulin [facility])   [facility] Ladell Pier, MD

## 2020-05-04 DIAGNOSIS — Z79899 Other long term (current) drug therapy: Secondary | ICD-10-CM | POA: Diagnosis not present

## 2020-05-04 DIAGNOSIS — M542 Cervicalgia: Secondary | ICD-10-CM | POA: Diagnosis not present

## 2020-05-04 DIAGNOSIS — G894 Chronic pain syndrome: Secondary | ICD-10-CM | POA: Diagnosis not present

## 2020-05-05 ENCOUNTER — Other Ambulatory Visit: Payer: Self-pay | Admitting: Neurology

## 2020-05-17 ENCOUNTER — Telehealth: Payer: Self-pay

## 2020-05-17 ENCOUNTER — Telehealth: Payer: Self-pay | Admitting: Internal Medicine

## 2020-05-17 NOTE — Telephone Encounter (Signed)
Pt called in to request a refill for Ambien    Pharmacy:  [place], [place] - [name] [place] [name] Phone:  [phone]  Fax:  [phone]

## 2020-05-17 NOTE — Telephone Encounter (Signed)
Pt requested refill of Ambien. Not on current med profile. Attempted to reach pt, unable to leave message to CB.  Last refill 04/24/17.  Please advise.

## 2020-05-17 NOTE — Telephone Encounter (Signed)
Copied from [place] [id number]. Topic: General - Call Back - No Documentation >> May 16, 2020  2:52 PM [place] Blinks wrote: Pt needs a call back from the nurse, she is missing Rx refills. Declined appt offer, would prefer to speak to nurse  Best contact: [phone]

## 2020-05-18 NOTE — Telephone Encounter (Signed)
Called pt left VM, to call back

## 2020-05-22 NOTE — Telephone Encounter (Signed)
See notes in epic

## 2020-05-22 NOTE — Telephone Encounter (Signed)
Spoke with pt made next avalable appt 06/30/2020. Stated she would like something to help her sleeping till the appt. Thank you

## 2020-05-22 NOTE — Telephone Encounter (Signed)
Unable to reach pt , left VM to call back

## 2020-06-01 NOTE — Telephone Encounter (Signed)
Pt aware.

## 2020-06-07 DIAGNOSIS — Z79899 Other long term (current) drug therapy: Secondary | ICD-10-CM | POA: Diagnosis not present

## 2020-06-07 DIAGNOSIS — M542 Cervicalgia: Secondary | ICD-10-CM | POA: Diagnosis not present

## 2020-06-07 DIAGNOSIS — G894 Chronic pain syndrome: Secondary | ICD-10-CM | POA: Diagnosis not present

## 2020-06-07 DIAGNOSIS — Z9189 Other specified personal risk factors, not elsewhere classified: Secondary | ICD-10-CM | POA: Diagnosis not present

## 2020-06-29 ENCOUNTER — Ambulatory Visit: Payer: Medicaid Other | Admitting: Internal Medicine

## 2020-07-20 DIAGNOSIS — M542 Cervicalgia: Secondary | ICD-10-CM | POA: Diagnosis not present

## 2020-07-20 DIAGNOSIS — M549 Dorsalgia, unspecified: Secondary | ICD-10-CM | POA: Diagnosis not present

## 2020-07-20 DIAGNOSIS — G8929 Other chronic pain: Secondary | ICD-10-CM | POA: Diagnosis not present

## 2020-07-20 DIAGNOSIS — Z79899 Other long term (current) drug therapy: Secondary | ICD-10-CM | POA: Diagnosis not present

## 2020-07-20 DIAGNOSIS — G894 Chronic pain syndrome: Secondary | ICD-10-CM | POA: Diagnosis not present

## 2020-08-11 ENCOUNTER — Ambulatory Visit: Payer: Medicaid Other | Attending: Internal Medicine | Admitting: Internal Medicine

## 2020-08-11 ENCOUNTER — Encounter: Payer: Self-pay | Admitting: Internal Medicine

## 2020-08-11 ENCOUNTER — Other Ambulatory Visit: Payer: Self-pay

## 2020-08-11 ENCOUNTER — Other Ambulatory Visit: Payer: Self-pay | Admitting: Internal Medicine

## 2020-08-11 VITALS — BP 163/90 | HR 74 | Resp 16 | Wt 239.8 lb

## 2020-08-11 DIAGNOSIS — N920 Excessive and frequent menstruation with regular cycle: Secondary | ICD-10-CM | POA: Diagnosis not present

## 2020-08-11 DIAGNOSIS — H538 Other visual disturbances: Secondary | ICD-10-CM

## 2020-08-11 DIAGNOSIS — Z794 Long term (current) use of insulin: Secondary | ICD-10-CM

## 2020-08-11 DIAGNOSIS — I10 Essential (primary) hypertension: Secondary | ICD-10-CM | POA: Diagnosis not present

## 2020-08-11 DIAGNOSIS — E1169 Type 2 diabetes mellitus with other specified complication: Secondary | ICD-10-CM

## 2020-08-11 DIAGNOSIS — E119 Type 2 diabetes mellitus without complications: Secondary | ICD-10-CM

## 2020-08-11 DIAGNOSIS — R61 Generalized hyperhidrosis: Secondary | ICD-10-CM | POA: Diagnosis not present

## 2020-08-11 DIAGNOSIS — Z2821 Immunization not carried out because of patient refusal: Secondary | ICD-10-CM

## 2020-08-11 DIAGNOSIS — D649 Anemia, unspecified: Secondary | ICD-10-CM

## 2020-08-11 DIAGNOSIS — E669 Obesity, unspecified: Secondary | ICD-10-CM

## 2020-08-11 DIAGNOSIS — G959 Disease of spinal cord, unspecified: Secondary | ICD-10-CM

## 2020-08-11 LAB — POCT GLYCOSYLATED HEMOGLOBIN (HGB A1C): HbA1c, POC (controlled diabetic range): 8.2 % — AB (ref 0.0–7.0)

## 2020-08-11 LAB — GLUCOSE, POCT (MANUAL RESULT ENTRY): POC Glucose: 159 mg/dl — AB (ref 70–99)

## 2020-08-11 MED ORDER — SITAGLIPTIN PHOSPHATE 100 MG PO TABS: 100.0000 mg | ORAL_TABLET | Freq: Every day | ORAL | 4 refills | Status: DC

## 2020-08-11 NOTE — Patient Instructions (Signed)
I have referred you to the ophthalmologist.  Increase Januvia to 100 mg daily.  Keep appointment with clinical pharmacist in 3 weeks for repeat blood pressure check.  Please keep a log of your blood pressure readings and bring them in with you on that visit.

## 2020-08-11 NOTE — Progress Notes (Signed)
Patient ID: Briana Williams, female    DOB: 27-Oct-1980  MRN: [id number]  CC: No chief complaint on file.   Subjective: Briana Williams is a 40 y.o. female who presents for chronic ds management.  Briana Williams is with her. Her concerns today include:  Hx of scoliosis(multiple back surgeries), HTN, insomnia, DM2, depression,anxiety migraines. 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 Last visit 05/2019, cervical myelopathy  Since last visit with me, she has had staged anterior posterior fusion involving the cervical spine by [facility] neurosurgery.  She states that the plan is now to do surgery on her lower back.  She has an appointment with them next month.      Complains of having a lot of night sweats x1.5 mths where she is waking up and having to change her top..  She keeps her thermostat set between 65 to 70 degrees.  She also keeps a fan on overhead and one on the ground on the side of her bed.  Denies any fever or chronic cough.  She has not noticed any swollen lymph glands.  Not had any weight loss unexplained. thermostat 65-70  Still get cycles regularly but reports that bleeding is very excessive with painful cramps.  Had seen GYN in the past regarding this.  Due for Pap smear.  She would be interested in seeing gynecology again to see what can be done about the heavy cycles..   DM: Lab Results  Component Value Date   HGBA1C 8.2 (A) 08/11/2020   A1C down 2 points Not checking blood sugars because she has been caring for her Briana Williams as he recovers from prolonged hospitalization from COVID infection. Reports compliance with taking Januvia, NovoLog 15 units with 2 largest meals of the day and Lantus 38 units daily. -Overdue for eye exam.  Complains of seeing shadow at the corner of her right eye when she lays down.  Not noticeable when she is up and moving around but she does notice some squiggly lines in her visual field in this eye.    HTN: Reports compliance with taking carvedilol, lisinopril and has  taken them already for today.  She has not been checking blood pressure.  Tries to limit salt in the foods.     Patient Active Problem List   Diagnosis Date Noted  . Influenza vaccine refused 08/11/2020  . Cervical myelopathy ([place]) 08/11/2020  . Closed fracture of odontoid process of axis ([place]) 06/11/2019  . Normochromic anemia 06/11/2019  . Confusion 11/19/2018  . Chronic migraine 11/19/2018  . Acute encephalopathy 10/03/2018  . AKI (acute kidney injury) ([place]) 10/03/2018  . Diabetic foot ulcer ([place]) 09/16/2018  . Diabetic foot infection ([place]) 07/12/2018  . Morbid obesity ([place]) 07/12/2018  . Toe infection 07/11/2018  . Muscle spasm of back 04/24/2017  . Osteoarthritis of both hips 01/15/2017  . Recurrent headache 01/06/2017  . Irregular menstrual cycle 05/30/2016  . Chronic pain syndrome 09/21/2015  . HYPERTRIGLYCERIDEMIA 05/02/2009  . 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 Insomnia 05/01/2009  . SCOLIOSIS 05/18/2008  . Diabetes type 2, uncontrolled ([place]) 04/18/2008  . DEPRESSION 04/18/2008  . Essential hypertension, benign 04/18/2008     Current Outpatient Medications on File Prior to Visit  Medication Sig Dispense Refill  . ACCU-CHEK SOFTCLIX LANCETS lancets 1 each by Other route 3 (three) times daily. ICD 10 E11.9 100 each 12  . albuterol (PROVENTIL HFA;VENTOLIN HFA) 108 (90 Base) MCG/ACT inhaler Inhale 1 puff into the lungs every 6 (six) hours as needed for wheezing or shortness  of breath. 8 g 3  . amitriptyline (ELAVIL) 25 MG tablet Take 1 tablet (25 mg total) by mouth at bedtime. 30 tablet 6  . atorvastatin (LIPITOR) 10 MG tablet Take 1 tablet (10 mg total) by mouth daily. 90 tablet 3  . Blood Glucose Monitoring Suppl (ACCU-CHEK AVIVA PLUS) w/Device KIT 1 Device by Does not apply route 3 (three) times daily after meals. ICD 10 E11.9 1 kit 0  . busPIRone (BUSPAR) 10 MG tablet Take 1 tablet (10 mg total) by mouth 3 (three) times daily. 90 tablet 1  . carvedilol (COREG) 6.25 MG  tablet Take 0.5 tablets (3.125 mg total) by mouth 2 (two) times daily with a meal. 60 tablet 6  . Continuous Blood Gluc Sensor (FREESTYLE LIBRE SENSOR SYSTEM) MISC Change sensor Q 2 wks 2 each 12  . Erenumab-aooe (AIMOVIG) 70 MG/ML SOAJ Administer 1 mL under the skin every 30 days. Must be seen for further refills. 1 mL 0  . escitalopram (LEXAPRO) 20 MG tablet Take 1 tablet (20 mg total) by mouth daily. 90 tablet 2  . gabapentin (NEURONTIN) 100 MG capsule TAKE 1 CAPSULE(100 MG) BY MOUTH THREE TIMES DAILY 90 capsule 1  . glucose blood (ACCU-CHEK AVIVA PLUS) test strip 1 each by Other route 3 (three) times daily. ICD 10 E11.9 100 each 12  . hydrochlorothiazide (HYDRODIURIL) 25 MG tablet Take 1 tablet (25 mg total) by mouth daily. 90 tablet 2  . Insulin Glargine (LANTUS SOLOSTAR) 100 UNIT/ML Solostar Pen Inject 38 Units into the skin daily. 15 mL 11  . Insulin Pen Needle (B-D ULTRAFINE III SHORT PEN) 31G X 8 MM MISC 1 application by Does not apply route daily. 60 each 5  . Lancet Devices (ACCU-CHEK SOFTCLIX) lancets 1 each by Other route 3 (three) times daily. ICD 10 E11.9 1 each 0  . lisinopril (ZESTRIL) 40 MG tablet Take 1 tablet (40 mg total) by mouth daily. 90 tablet 2  . meloxicam (MOBIC) 15 MG tablet Take 15 mg by mouth daily.    Marland Kitchen NOVOLOG FLEXPEN 100 UNIT/ML FlexPen INJECT 15 UNITS INTO THE SKIN WITH THE TWO LARGEST MEALS OF THE DAY. 15 mL 2  . ondansetron (ZOFRAN ODT) 4 MG disintegrating tablet Take 1 tablet (4 mg total) by mouth every 8 (eight) hours as needed. 20 tablet 6  . SUMAtriptan (IMITREX) 100 MG tablet Take 1 tablet (100 mg total) by mouth once as needed for up to 1 dose for migraine. May repeat in 2 hours if headache persists or recurs. 12 tablet 11  . tiZANidine (ZANAFLEX) 4 MG tablet TAKE 1 TABLET BY MOUTH EVERY 8 HOURS AS NEEDED 30 tablet 0  . topiramate (TOPAMAX) 50 MG tablet Take 1 tablet (50 mg total) by mouth 2 (two) times daily. 60 tablet 11  . triamcinolone cream (KENALOG)  0.1 % Apply 1 application topically 2 (two) times daily. 30 g 0   No current facility-administered medications on file prior to visit.    Allergies  Allergen Reactions  . Hydrocodone Itching    Note: benadryl does NOT help  . Metformin And Related Other (See Comments)    Causes blisters on skin  . Norvasc [Amlodipine Besylate] Swelling    Ankle swelling  . Ultrasound Gel Rash and Other (See Comments)    Patient gets a red rash with use of ultrasound gel/ burns skin    Social History   Socioeconomic History  . Marital status: Married    Spouse name: Not on file  .  Number of children: 3  . Years of education: Not on file  . Highest education level: Not on file  Occupational History    Employer: UNEMPLOYED  Tobacco Use  . Smoking status: Never Smoker  . Smokeless tobacco: Never Used  Substance and Sexual Activity  . Alcohol use: No  . Drug use: No  . Sexual activity: Yes    Birth control/protection: Other-see comments    Comment: unable to have children d/t scoliosis  Other Topics Concern  . Not on file  Social History Narrative  . Not on file   Social Determinants of Health   Financial Resource Strain: Not on file  Food Insecurity: Not on file  Transportation Needs: Not on file  Physical Activity: Not on file  Stress: Not on file  Social Connections: Not on file  Intimate Partner Violence: Not on file    Family History  Problem Relation Age of Onset  . Cancer Maternal Grandmother        mandibular cancer   . Diabetes Mother   . Heart failure Mother   . CAD Mother        triple CABG  . Diabetes Maternal Aunt   . Stroke Father     Past Surgical History:  Procedure Laterality Date  . BACK SURGERY    . CERVICAL FUSION    . frontal closed sutures to forehead    . Harrington Rod placement    . HIP SURGERY     staph infection  . LUMBAR FUSION    . RADIOLOGY WITH ANESTHESIA N/A 02/25/2017   Procedure: RADIOLOGY WITH ANESTHESIA/MRI THORACIC SPINE AND LEFT  SHOULDER WITHOUT CONTRAST.;  Surgeon: Radiologist, Medication, MD;  Location: [facility];  Service: Radiology;  Laterality: N/A;    ROS: Review of Systems Negative except as stated above  PHYSICAL EXAM: BP (!) 163/93   Pulse 74   Resp 16   Wt 239 lb 12.8 oz (108.8 kg)   SpO2 100%   BMI 53.76 kg/m   Wt Readings from Last 3 Encounters:  08/11/20 239 lb 12.8 oz (108.8 kg)  03/11/19 220 lb (99.8 kg)  03/11/19 220 lb (99.8 kg)    Physical Exam  General appearance - alert, well appearing, and in no distress Mental status - normal mood, behavior, speech, dress, motor activity, and thought processes Neck -no abnormal masses.  No thyromegaly. Lymphatics -no cervical or axillary lymphadenopathy.   Chest - clear to auscultation, no wheezes, rales or rhonchi, symmetric air entry Heart - normal rate, regular rhythm, normal S1, S2, no murmurs, rubs, clicks or gallops Neurological -extraocular movement intact.  Peripheral vision on screening seems intact. Extremities -nonpitting edema in both lower extremities left greater than right.  Patient states that this is chronic and has been this way due to the scoliosis. Diabetic Foot Exam - Simple   Simple Foot Form Visual Inspection See comments: Yes Sensation Testing See comments: Yes Pulse Check Posterior Tibialis and Dorsalis pulse intact bilaterally: Yes Comments High arches bilaterally.  Mild decrease sensation on the plantar surface of the left foot.      CMP Latest Ref Rng & Units 10/12/2018 10/04/2018 10/03/2018  Glucose 70 - 99 mg/dL - 140(H) 259(H)  BUN 6 - 20 mg/dL - 17 42(H)  Creatinine 0.44 - 1.00 mg/dL - 0.84 1.01(H)  Sodium 135 - 145 mmol/L - 138 135  Potassium 3.5 - 5.1 mmol/L - 3.7 4.6  Chloride 98 - 111 mmol/L - 105 105  CO2 22 - 32  mmol/L - 25 22  Calcium 8.9 - 10.3 mg/dL - 9.1 9.5  Total Protein 6.1 - 8.1 g/dL 7.6 7.3 -  Total Bilirubin 0.3 - 1.2 mg/dL - 0.5 -  Alkaline Phos 38 - 126 U/L - 69 -  AST 15 - 41 U/L - 24 -   ALT 0 - 44 U/L - 20 -   Lipid Panel     Component Value Date/Time   CHOL 246 (H) 02/06/2018 1632   TRIG 260 (H) 02/06/2018 1632   HDL 46 02/06/2018 1632   CHOLHDL 5.3 (H) 02/06/2018 1632   CHOLHDL 5.3 (H) 09/21/2015 1740   VLDL 56 (H) 09/21/2015 1740   LDLCALC 148 (H) 02/06/2018 1632    CBC    Component Value Date/Time   WBC 5.6 10/04/2018 1207   RBC 3.38 (L) 10/04/2018 1207   HGB 9.4 (L) 10/04/2018 1207   HGB 11.2 09/15/2018 1702   HCT 29.0 (L) 10/04/2018 1207   HCT 33.8 (L) 09/15/2018 1702   PLT 193 10/04/2018 1207   PLT 278 09/15/2018 1702   MCV 85.8 10/04/2018 1207   MCV 83 09/15/2018 1702   MCH 27.8 10/04/2018 1207   MCHC 32.4 10/04/2018 1207   RDW 14.6 10/04/2018 1207   RDW 13.3 09/15/2018 1702   LYMPHSABS 2.0 10/04/2018 1207   LYMPHSABS 2.5 09/15/2018 1702   MONOABS 0.5 10/04/2018 1207   EOSABS 0.2 10/04/2018 1207   EOSABS 0.2 09/15/2018 1702   BASOSABS 0.0 10/04/2018 1207   BASOSABS 0.0 09/15/2018 1702    ASSESSMENT AND PLAN: 1. Type 2 diabetes mellitus with obesity ([place]) Not at goal but improved.  Encourage patient to check blood sugars daily.  We have increased the Januvia to 100 mg daily.  Continue current doses of insulin.  Healthy eating habits encouraged. - POCT glucose (manual entry) - POCT glycosylated hemoglobin (Hb A1C) - CBC - Comprehensive metabolic panel - Lipid panel - Ambulatory referral to Ophthalmology - sitaGLIPtin (JANUVIA) 100 MG tablet; Take 1 tablet (100 mg total) by mouth daily.  Dispense: 30 tablet; Refill: 4  2. Essential hypertension Not at goal.  Encouraged her to check blood pressure at home with goal being 130/80 or lower.  We will have her follow-up with the clinical pharmacist in a few weeks for recheck.  Continue hydrochlorothiazide, carvedilol, and lisinopril.  3. Blurred vision, right eye I will have our referral coordinator try to get her in with ophthalmology as soon as possible. - Ambulatory referral to  Ophthalmology  4. Night sweats Doubt she is perimenopausal given her age.  History and physical exam does not suggest any underlying chronic infection like tuberculosis.  No swollen lymph glands palpated on exam.  I have recommended just trying to keep the temperature in her room comfortable.  She is on polypharmacy including gabapentin which can sometimes help with night sweats. - Follicle stimulating hormone - Luteinizing hormone  5. Menorrhagia with regular cycle - Ambulatory referral to Gynecology  6. Influenza vaccine refused   7. Cervical myelopathy (HCC) Status post surgery.  She follows with [facility] neurosurgery.      Patient was given the opportunity to ask questions.  Patient verbalized understanding of the plan and was able to repeat key elements of the plan.   Orders Placed This Encounter  Procedures  . CBC  . Comprehensive metabolic panel  . Lipid panel  . Follicle stimulating hormone  . Luteinizing hormone  . Ambulatory referral to Ophthalmology  . Ambulatory referral to Gynecology  .  POCT glucose (manual entry)  . POCT glycosylated hemoglobin (Hb A1C)     Requested Prescriptions   Signed Prescriptions Disp Refills  . sitaGLIPtin (JANUVIA) 100 MG tablet 30 tablet 4    Sig: Take 1 tablet (100 mg total) by mouth daily.    Return in about 4 months (around 12/09/2020) for Give appointment with Lurena Joiner in 3 weeks for repeat blood pressure check.Karle Plumber, MD, FACP

## 2020-08-11 NOTE — Progress Notes (Signed)
Pt states her pain is also coming from

## 2020-08-12 LAB — LIPID PANEL
Chol/HDL Ratio: 3.2 ratio (ref 0.0–4.4)
Cholesterol, Total: 198 mg/dL (ref 100–199)
HDL: 62 mg/dL (ref >39)
LDL Chol Calc (NIH): 116 mg/dL — ABNORMAL HIGH (ref 0–99)
Triglycerides: 112 mg/dL (ref 0–149)
VLDL Cholesterol Cal: 20 mg/dL (ref 5–40)

## 2020-08-12 LAB — COMPREHENSIVE METABOLIC PANEL
ALT: 26 IU/L (ref 0–32)
AST: 19 IU/L (ref 0–40)
Albumin/Globulin Ratio: 1.3 (ref 1.2–2.2)
Albumin: 3.9 g/dL (ref 3.8–4.8)
Alkaline Phosphatase: 87 IU/L (ref 44–121)
BUN/Creatinine Ratio: 14 (ref 9–23)
BUN: 20 mg/dL (ref 6–24)
Bilirubin Total: 0.3 mg/dL (ref 0.0–1.2)
CO2: 23 mmol/L (ref 20–29)
Calcium: 9.1 mg/dL (ref 8.7–10.2)
Chloride: 102 mmol/L (ref 96–106)
Creatinine, Ser: 1.4 mg/dL — ABNORMAL HIGH (ref 0.57–1.00)
GFR calc Af Amer: 54 mL/min/{1.73_m2} — ABNORMAL LOW (ref >59)
GFR calc non Af Amer: 47 mL/min/{1.73_m2} — ABNORMAL LOW (ref >59)
Globulin, Total: 3.1 g/dL (ref 1.5–4.5)
Glucose: 138 mg/dL — ABNORMAL HIGH (ref 65–99)
Potassium: 4.6 mmol/L (ref 3.5–5.2)
Sodium: 137 mmol/L (ref 134–144)
Total Protein: 7 g/dL (ref 6.0–8.5)

## 2020-08-12 LAB — FOLLICLE STIMULATING HORMONE: FSH: 8.8 m[IU]/mL

## 2020-08-12 LAB — CBC
Hematocrit: 30.4 % — ABNORMAL LOW (ref 34.0–46.6)
Hemoglobin: 10.4 g/dL — ABNORMAL LOW (ref 11.1–15.9)
MCH: 27.5 pg (ref 26.6–33.0)
MCHC: 34.2 g/dL (ref 31.5–35.7)
MCV: 80 fL (ref 79–97)
Platelets: 230 10*3/uL (ref 150–450)
RBC: 3.78 x10E6/uL (ref 3.77–5.28)
RDW: 14.2 % (ref 11.7–15.4)
WBC: 7.7 10*3/uL (ref 3.4–10.8)

## 2020-08-12 LAB — LUTEINIZING HORMONE: LH: 7.6 m[IU]/mL

## 2020-08-13 NOTE — Addendum Note (Signed)
Addended by: Karle Plumber B on: 08/13/2020 11:33 AM   Modules accepted: Orders

## 2020-08-14 ENCOUNTER — Telehealth: Payer: Self-pay

## 2020-08-14 NOTE — Telephone Encounter (Signed)
Contacted pt to go over lab results pt is aware and doesn't any questions or concerns

## 2020-08-16 MED FILL — JANUVIA 100 MG TABLET: 100 | 90 days supply | Qty: 90 | Fill #0

## 2020-08-17 LAB — IRON,TIBC AND FERRITIN PANEL
Ferritin: 39 ng/mL (ref 15–150)
Iron Saturation: 25 % (ref 15–55)
Iron: 85 ug/dL (ref 27–159)
Total Iron Binding Capacity: 335 ug/dL (ref 250–450)
UIBC: 250 ug/dL (ref 131–425)

## 2020-08-17 LAB — SPECIMEN STATUS REPORT

## 2020-09-01 ENCOUNTER — Ambulatory Visit: Payer: Medicaid Other | Admitting: Pharmacist

## 2020-09-04 ENCOUNTER — Encounter: Payer: Self-pay | Admitting: Orthopedic Surgery

## 2020-09-04 ENCOUNTER — Ambulatory Visit (INDEPENDENT_AMBULATORY_CARE_PROVIDER_SITE_OTHER): Payer: Medicaid Other | Admitting: Orthopedic Surgery

## 2020-09-04 ENCOUNTER — Ambulatory Visit (INDEPENDENT_AMBULATORY_CARE_PROVIDER_SITE_OTHER): Payer: Medicaid Other

## 2020-09-04 DIAGNOSIS — M79672 Pain in left foot: Secondary | ICD-10-CM | POA: Diagnosis not present

## 2020-09-04 MED ORDER — GABAPENTIN 300 MG PO CAPS: 300.0000 mg | ORAL_CAPSULE | Freq: Three times a day (TID) | ORAL | 3 refills | Status: DC

## 2020-09-04 NOTE — Progress Notes (Signed)
Office Visit Note   Patient: Briana Williams           Date of Birth: Nov 01, 1980           MRN: [id number] Visit Date: 09/04/2020              Requested by: Ladell Pier, MD [address] [place],  [place] [address] PCP: Ladell Pier, MD  Chief Complaint  Patient presents with  . Left Foot - Pain      HPI: Patient is a 40 year old woman who presents with left foot pain and swelling.  She states that the pain is worse with attempted weightbearing minimal pain at rest she states she does have swelling for the past 2 to 3 days.  Patient states she has a history of multiple spine surgeries denies any back pain at this time denies any pain radiating from her back down to her foot.  Patient states she is currently not taking any Neurontin but has taken it 800 mg twice a day in the past.  Assessment & Plan: Visit Diagnoses:  1. Pain in left foot     Plan: We will place her in a fracture boot this may be related to her back or possible Charcot arthropathy.  She has no clinical signs of a DVT no clinical signs of infection.  Her symptoms seem to be more mechanical at this time we will start Neurontin.  Follow-Up Instructions: No follow-ups on file.   Ortho Exam  Patient is alert, oriented, no adenopathy, well-dressed, normal affect, normal respiratory effort. Examination patient does have swelling of the left foot but no cellulitis no ulcers.  The Doppler was used and she does have a strong dorsalis pedis and posterior tibial biphasic pulse she has a negative straight leg raise she has good hair growth on the foot she has global pain to palpation around the foot.  The calf is soft with minimal tenderness to palpation.  Active range of motion of her ankle causes foot pain does not cause any calf discomfort.  Pain is primarily across her midfoot with normal radiographs.  Imaging: XR Foot Complete Left  Result Date: 09/04/2020 Three-view radiographs of the left foot  shows no fractures no joint dislocations.  No images are attached to the encounter.  Labs: Lab Results  Component Value Date   HGBA1C 8.2 (A) 08/11/2020   HGBA1C 10.2 (A) 03/11/2019   HGBA1C 10.3 (A) 09/15/2018   ESRSEDRATE 73 (H) 09/15/2018   ESRSEDRATE 44 (H) 07/11/2018   ESRSEDRATE 21 (H) 12/10/2016   CRP 2.7 (H) 07/11/2018   CRP 5.3 12/10/2016   REPTSTATUS 10/08/2018 FINAL 10/03/2018   CULT  10/03/2018    NO GROWTH 5 DAYS Performed at [facility], [place] [address], [place], [place] [address]    LABORGA ESCHERICHIA COLI 01/12/2011     Lab Results  Component Value Date   ALBUMIN 3.9 08/11/2020   ALBUMIN 3.1 (L) 10/04/2018   ALBUMIN 3.5 10/02/2018    No results found for: MG No results found for: VD25OH  No results found for: PREALBUMIN CBC EXTENDED Latest Ref Rng & Units 08/11/2020 10/04/2018 10/03/2018  WBC 3.4 - 10.8 x10E3/uL 7.7 5.6 -  RBC 3.77 - 5.28 x10E6/uL 3.78 3.38(L) -  HGB 11.1 - 15.9 g/dL 10.4(L) 9.4(L) 12.6  HCT 34.0 - 46.6 % 30.4(L) 29.0(L) 37.0  PLT 150 - 450 x10E3/uL 230 193 -  NEUTROABS 1.7 - 7.7 K/uL - 2.9 -  LYMPHSABS 0.7 -  4.0 K/uL - 2.0 -     There is no height or weight on file to calculate BMI.  Orders:  Orders Placed This Encounter  Procedures  . XR Foot Complete Left   No orders of the defined types were placed in this encounter.    Procedures: No procedures performed  Clinical Data: No additional findings.  ROS:  All other systems negative, except as noted in the HPI. Review of Systems  Objective: Vital Signs: There were no vitals taken for this visit.  Specialty Comments:  No specialty comments available.  PMFS History: Patient Active Problem List   Diagnosis Date Noted  . Influenza vaccine refused 08/11/2020  . Cervical myelopathy ([place]) 08/11/2020  . Closed fracture of odontoid process of axis ([place]) 06/11/2019  . Normochromic anemia 06/11/2019  . Confusion 11/19/2018  . Chronic migraine 11/19/2018  . Acute  encephalopathy 10/03/2018  . AKI (acute kidney injury) ([place]) 10/03/2018  . Diabetic foot ulcer ([place]) 09/16/2018  . Diabetic foot infection ([place]) 07/12/2018  . Morbid obesity ([place]) 07/12/2018  . Toe infection 07/11/2018  . Muscle spasm of back 04/24/2017  . Osteoarthritis of both hips 01/15/2017  . Recurrent headache 01/06/2017  . Irregular menstrual cycle 05/30/2016  . Chronic pain syndrome 09/21/2015  . HYPERTRIGLYCERIDEMIA 05/02/2009  . 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 Insomnia 05/01/2009  . SCOLIOSIS 05/18/2008  . Diabetes type 2, uncontrolled ([place]) 04/18/2008  . DEPRESSION 04/18/2008  . Essential hypertension, benign 04/18/2008   Past Medical History:  Diagnosis Date  . Arthritis   . GERD (gastroesophageal reflux disease)   . Headache    migraine 2 weeks ago  . Hyperlipidemia   . Hypertension associated with diabetes ([place])   . Renal disorder    has both kidneys, compacted and 'squashed together"  . Scoliosis   . Sleep apnea    in the midst of being tested for it.   waiting on scheduling   . Type 2 diabetes mellitus treated with insulin (HCC)    dx 2000    Family History  Problem Relation Age of Onset  . Cancer Maternal Grandmother        mandibular cancer   . Diabetes Mother   . Heart failure Mother   . CAD Mother        triple CABG  . Diabetes Maternal Aunt   . Stroke Father     Past Surgical History:  Procedure Laterality Date  . BACK SURGERY    . CERVICAL FUSION    . frontal closed sutures to forehead    . Harrington Rod placement    . HIP SURGERY     staph infection  . LUMBAR FUSION    . RADIOLOGY WITH ANESTHESIA N/A 02/25/2017   Procedure: RADIOLOGY WITH ANESTHESIA/MRI THORACIC SPINE AND LEFT SHOULDER WITHOUT CONTRAST.;  Surgeon: Radiologist, Medication, MD;  Location: [facility];  Service: Radiology;  Laterality: N/A;   Social History   Occupational History    Employer: UNEMPLOYED  Tobacco Use  . Smoking status: Never Smoker  . Smokeless tobacco:  Never Used  Substance and Sexual Activity  . Alcohol use: No  . Drug use: No  . Sexual activity: Yes    Birth control/protection: Other-see comments    Comment: unable to have children d/t scoliosis

## 2020-09-05 NOTE — Progress Notes (Signed)
S:    PCP: Dr. Wynetta Emery  PMH: HTN, T2DM, migraines, HLD, depression/anxiety  Patient arrives in good spirits ambulating with a left leg cast/boot and accompanied a family member. Presents to the clinic for hypertension evaluation, counseling, and management.  Patient was referred and last seen by Primary Care Provider on 08/11/20 and BP was elevated at 163/90. Pt continued on current regimen and referred to pharmacy for BP check.  Today, patient endorses adherence to HCTZ and lisinopril daily. Reports taking carvedilol daily instead of twice daily as prescribed. LE edema present (at baseline). Denies headaches and dizziness. Reports home BP 150-160s/70-90s. She reports signficant left leg pain currently being managed by Dr. Sharol Given - follow-up appointment scheduled next week for X-rays. Denies NSAID use. She is taking oxycodone prn which is managed by her pain specialist for chronic pain management (hx of multuple skull and spine surgeries).  Medication adherence fair.  Current BP Medications include:  Carvedilol 6.25 mg BID (taking daily), HCTZ 25 mg daily (AM), lisinopril 40 mg daily (PM)  Antihypertensives tried in the past include: amlodipine (LE swelling)  Dietary habits include: will address at next visit  Exercise habits include: limited due to leg pain  Family / Social history: -Fhx: Mother with DM, HF and CAD; Father with stroke; aunt with DM -Tobacco use: denies   O:  Vitals:   09/06/20 1512  BP: (!) 152/92  Pulse: 88    Home BP readings: 164/91, 158/82, 158/91, 149/77, 156/76, 155/98  Last 3 Office BP readings: BP Readings from Last 3 Encounters:  09/06/20 (!) 152/92  08/11/20 (!) 163/90  03/11/19 (!) 168/92    BMET    Component Value Date/Time   NA 137 08/11/2020 1632   K 4.6 08/11/2020 1632   CL 102 08/11/2020 1632   CO2 23 08/11/2020 1632   GLUCOSE 138 (H) 08/11/2020 1632   GLUCOSE 140 (H) 10/04/2018 1207   BUN 20 08/11/2020 1632   CREATININE 1.40 (H)  08/11/2020 1632   CREATININE 0.59 09/21/2015 1740   CALCIUM 9.1 08/11/2020 1632   GFRNONAA 47 (L) 08/11/2020 1632   GFRNONAA >89 09/21/2015 1740   GFRAA 54 (L) 08/11/2020 1632   GFRAA >89 09/21/2015 1740    Renal function: CrCl cannot be calculated (Patient's most recent lab result is older than the maximum 21 days allowed.).  Clinical ASCVD: No  The 10-year ASCVD risk score Mikey Bussing DC Jr., et al., 2013) is: 1.6%   Values used to calculate the score:     Age: 40 years     Sex: Female     Is Non-Hispanic African American: No     Diabetic: Yes     Tobacco smoker: No     Systolic Blood Pressure: 0000000 mmHg     Is BP treated: Yes     HDL Cholesterol: 62 mg/dL     Total Cholesterol: 198 mg/dL  A/P: Hypertension longstanding currently uncontrolled on current medications. BP Goal = <130/80 mmHg. Medication adherence appears fair - taking carvedilol 6.25 mg daily instead of twice daily as prescribed. Additionally, patient experiencing increased leg pain and edema - monitored by Dr. Sharol Given with X-rays scheduled for next week. Unable to discuss diet and exercise habits due to time constraints, will address at next visit. -Increased carvedilol to 12.5 mg BID -Continued HCTZ 25 mg daily -Continued lisinopril 40 mg daily -Counseled on lifestyle modifications for blood pressure control including reduced dietary sodium, increased exercise, adequate sleep.  Results reviewed and written information provided. Total  time in face-to-face counseling 35 minutes.   F/U Clinic Visit in 4 weeks.    Lorel Monaco, PharmD, [place] PGY2 Ambulatory Care Resident [place]

## 2020-09-06 ENCOUNTER — Ambulatory Visit: Payer: Medicaid Other | Attending: Internal Medicine | Admitting: Pharmacist

## 2020-09-06 ENCOUNTER — Encounter: Payer: Self-pay | Admitting: Pharmacist

## 2020-09-06 ENCOUNTER — Other Ambulatory Visit: Payer: Self-pay

## 2020-09-06 DIAGNOSIS — E669 Obesity, unspecified: Secondary | ICD-10-CM

## 2020-09-06 DIAGNOSIS — I10 Essential (primary) hypertension: Secondary | ICD-10-CM | POA: Diagnosis not present

## 2020-09-06 DIAGNOSIS — E1169 Type 2 diabetes mellitus with other specified complication: Secondary | ICD-10-CM | POA: Diagnosis not present

## 2020-09-06 MED ORDER — CARVEDILOL 12.5 MG PO TABS: 12.5000 mg | ORAL_TABLET | Freq: Two times a day (BID) | ORAL | 3 refills | Status: DC

## 2020-09-07 ENCOUNTER — Encounter: Payer: Self-pay | Admitting: Internal Medicine

## 2020-09-07 ENCOUNTER — Other Ambulatory Visit: Payer: Self-pay | Admitting: Internal Medicine

## 2020-09-07 ENCOUNTER — Encounter: Payer: Self-pay | Admitting: Pharmacist

## 2020-09-07 DIAGNOSIS — E1121 Type 2 diabetes mellitus with diabetic nephropathy: Secondary | ICD-10-CM

## 2020-09-07 LAB — MICROALBUMIN / CREATININE URINE RATIO
Creatinine, Urine: 61.5 mg/dL
Microalb/Creat Ratio: 5362 mg/g creat — ABNORMAL HIGH (ref 0–29)
Microalbumin, Urine: 3297.7 ug/mL

## 2020-09-11 ENCOUNTER — Ambulatory Visit (INDEPENDENT_AMBULATORY_CARE_PROVIDER_SITE_OTHER): Payer: Medicaid Other

## 2020-09-11 ENCOUNTER — Ambulatory Visit (INDEPENDENT_AMBULATORY_CARE_PROVIDER_SITE_OTHER): Payer: Medicaid Other | Admitting: Orthopedic Surgery

## 2020-09-11 DIAGNOSIS — I872 Venous insufficiency (chronic) (peripheral): Secondary | ICD-10-CM | POA: Diagnosis not present

## 2020-09-11 DIAGNOSIS — M25572 Pain in left ankle and joints of left foot: Secondary | ICD-10-CM

## 2020-09-12 ENCOUNTER — Encounter: Payer: Self-pay | Admitting: Orthopedic Surgery

## 2020-09-12 DIAGNOSIS — G894 Chronic pain syndrome: Secondary | ICD-10-CM | POA: Diagnosis not present

## 2020-09-12 DIAGNOSIS — M542 Cervicalgia: Secondary | ICD-10-CM | POA: Diagnosis not present

## 2020-09-12 DIAGNOSIS — M549 Dorsalgia, unspecified: Secondary | ICD-10-CM | POA: Diagnosis not present

## 2020-09-12 DIAGNOSIS — Z79899 Other long term (current) drug therapy: Secondary | ICD-10-CM | POA: Diagnosis not present

## 2020-09-12 DIAGNOSIS — G8929 Other chronic pain: Secondary | ICD-10-CM | POA: Diagnosis not present

## 2020-09-12 NOTE — Progress Notes (Signed)
Office Visit Note   Patient: Briana Williams           Date of Birth: Dec 12, 1980           MRN: [id number] Visit Date: 09/11/2020              Requested by: Ladell Pier, MD [address] [place],  [place] [address] PCP: Ladell Pier, MD  Chief Complaint  Patient presents with  . Left Foot - Follow-up      HPI: Patient is a 40 year old woman who presents complaining of increasing swelling in the left lower extremity she has been taking Neurontin but states this has not affected her symptoms.  Assessment & Plan: Visit Diagnoses:  1. Pain in left ankle and joints of left foot   2. Venous stasis dermatitis of left lower extremity     Plan: We will apply a Dynaflex compression wrap due to the increased venous swelling recommended elevation.  Follow-Up Instructions: Return in about 1 week (around 09/18/2020).   Ortho Exam  Patient is alert, oriented, no adenopathy, well-dressed, normal affect, normal respiratory effort. Examination patient has increased pitting edema of the left lower extremity she has good pulses there is no pain to palpation over the greater saphenous vein. Dorsiflexion the ankle is not painful. There is no redness no cellulitis no signs of active Charcot collapse or infection. No signs or history of gout.  Imaging: XR Ankle Complete Left  Result Date: 09/12/2020 Three-view radiographs of the left ankle shows no fracture no Charcot arthropathy. The joint is congruent.  No images are attached to the encounter.  Labs: Lab Results  Component Value Date   HGBA1C 8.2 (A) 08/11/2020   HGBA1C 10.2 (A) 03/11/2019   HGBA1C 10.3 (A) 09/15/2018   ESRSEDRATE 73 (H) 09/15/2018   ESRSEDRATE 44 (H) 07/11/2018   ESRSEDRATE 21 (H) 12/10/2016   CRP 2.7 (H) 07/11/2018   CRP 5.3 12/10/2016   REPTSTATUS 10/08/2018 FINAL 10/03/2018   CULT  10/03/2018    NO GROWTH 5 DAYS Performed at [facility], [place] [address], [place], [place] [address]     LABORGA ESCHERICHIA COLI 01/12/2011     Lab Results  Component Value Date   ALBUMIN 3.9 08/11/2020   ALBUMIN 3.1 (L) 10/04/2018   ALBUMIN 3.5 10/02/2018    No results found for: MG No results found for: VD25OH  No results found for: PREALBUMIN CBC EXTENDED Latest Ref Rng & Units 08/11/2020 10/04/2018 10/03/2018  WBC 3.4 - 10.8 x10E3/uL 7.7 5.6 -  RBC 3.77 - 5.28 x10E6/uL 3.78 3.38(L) -  HGB 11.1 - 15.9 g/dL 10.4(L) 9.4(L) 12.6  HCT 34.0 - 46.6 % 30.4(L) 29.0(L) 37.0  PLT 150 - 450 x10E3/uL 230 193 -  NEUTROABS 1.7 - 7.7 K/uL - 2.9 -  LYMPHSABS 0.7 - 4.0 K/uL - 2.0 -     There is no height or weight on file to calculate BMI.  Orders:  Orders Placed This Encounter  Procedures  . XR Ankle Complete Left   No orders of the defined types were placed in this encounter.    Procedures: No procedures performed  Clinical Data: No additional findings.  ROS:  All other systems negative, except as noted in the HPI. Review of Systems  Objective: Vital Signs: There were no vitals taken for this visit.  Specialty Comments:  No specialty comments available.  PMFS History: Patient Active Problem List   Diagnosis Date Noted  . Influenza vaccine refused  08/11/2020  . Cervical myelopathy ([place]) 08/11/2020  . Closed fracture of odontoid process of axis ([place]) 06/11/2019  . Normochromic anemia 06/11/2019  . Confusion 11/19/2018  . Chronic migraine 11/19/2018  . Acute encephalopathy 10/03/2018  . AKI (acute kidney injury) ([place]) 10/03/2018  . Diabetic foot ulcer ([place]) 09/16/2018  . Diabetic foot infection ([place]) 07/12/2018  . Morbid obesity ([place]) 07/12/2018  . Toe infection 07/11/2018  . Muscle spasm of back 04/24/2017  . Osteoarthritis of both hips 01/15/2017  . Recurrent headache 01/06/2017  . Irregular menstrual cycle 05/30/2016  . Chronic pain syndrome 09/21/2015  . HYPERTRIGLYCERIDEMIA 05/02/2009  . 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 Insomnia 05/01/2009  . SCOLIOSIS  05/18/2008  . Diabetes type 2, uncontrolled ([place]) 04/18/2008  . DEPRESSION 04/18/2008  . Essential hypertension, benign 04/18/2008   Past Medical History:  Diagnosis Date  . Arthritis   . GERD (gastroesophageal reflux disease)   . Headache    migraine 2 weeks ago  . Hyperlipidemia   . Hypertension associated with diabetes ([place])   . Renal disorder    has both kidneys, compacted and 'squashed together"  . Scoliosis   . Sleep apnea    in the midst of being tested for it.   waiting on scheduling   . Type 2 diabetes mellitus treated with insulin (H[facility])    dx 2000    Family History  Problem Relation Age of Onset  . Cancer Maternal Grandmother        mandibular cancer   . Diabetes Mother   . Heart failure Mother   . CAD Mother        triple CABG  . Diabetes Maternal Aunt   . Stroke Father     Past Surgical History:  Procedure Laterality Date  . BACK SURGERY    . CERVICAL FUSION    . frontal closed sutures to forehead    . Harrington Rod placement    . HIP SURGERY     staph infection  . LUMBAR FUSION    . RADIOLOGY WITH ANESTHESIA N/A 02/25/2017   Procedure: RADIOLOGY WITH ANESTHESIA/MRI THORACIC SPINE AND LEFT SHOULDER WITHOUT CONTRAST.;  Surgeon: Radiologist, Medication, MD;  Location: [facility];  Service: Radiology;  Laterality: N/A;   Social History   Occupational History    Employer: UNEMPLOYED  Tobacco Use  . Smoking status: Never Smoker  . Smokeless tobacco: Never Used  Substance and Sexual Activity  . Alcohol use: No  . Drug use: No  . Sexual activity: Yes    Birth control/protection: Other-see comments    Comment: unable to have children d/t scoliosis

## 2020-09-13 ENCOUNTER — Other Ambulatory Visit: Payer: Self-pay | Admitting: Internal Medicine

## 2020-09-13 ENCOUNTER — Encounter: Payer: Self-pay | Admitting: Internal Medicine

## 2020-09-13 DIAGNOSIS — Z981 Arthrodesis status: Secondary | ICD-10-CM

## 2020-09-13 DIAGNOSIS — M4103 Infantile idiopathic scoliosis, cervicothoracic region: Secondary | ICD-10-CM

## 2020-09-18 ENCOUNTER — Ambulatory Visit: Payer: Medicaid Other | Admitting: Orthopedic Surgery

## 2020-09-25 ENCOUNTER — Ambulatory Visit (INDEPENDENT_AMBULATORY_CARE_PROVIDER_SITE_OTHER): Payer: Medicaid Other | Admitting: Physician Assistant

## 2020-09-25 ENCOUNTER — Encounter: Payer: Self-pay | Admitting: Orthopedic Surgery

## 2020-09-25 ENCOUNTER — Encounter: Payer: Self-pay | Admitting: Internal Medicine

## 2020-09-25 DIAGNOSIS — I872 Venous insufficiency (chronic) (peripheral): Secondary | ICD-10-CM

## 2020-09-25 NOTE — Progress Notes (Signed)
Office Visit Note   Patient: Briana Williams           Date of Birth: [birth year]           MRN: [id number] Visit Date: 09/25/2020              Requested by: Ladell Pier, MD [address] [place],  [place] [address] PCP: Ladell Pier, MD  Chief Complaint  Patient presents with  . Left Leg - Follow-up    VSI       HPI: Patient has a history of left lower extremity venous insufficiency.  She was placed in a Profore dressing wrap 2 weeks ago.  She said the following day she thought her toes were turning blue and she did try to call the on-call service and could not get a hold of anybody.  She then removed the wrap herself.  As she was concerned for circulation issues.  She says she tried to reschedule her appointment as she had a bad reaction from the booster shot.  She said she was not offered any earlier appointments  Assessment & Plan: Visit Diagnoses: No diagnosis found.  Plan: Patient will go back into a Profore dressing follow-up in 1 week emphasized the importance of elevating her extremity  Follow-Up Instructions: No follow-ups on file.   Ortho Exam  Patient is alert, oriented, no adenopathy, well-dressed, normal affect, normal respiratory effort. Focused examination of the left lower extremity demonstrate significant soft tissue swelling no cellulitis.  There are no ulcers or discoloration in the skin.  No signs of infection.  Skin is taut  Imaging: No results found. No images are attached to the encounter.  Labs: Lab Results  Component Value Date   HGBA1C 8.2 (A) 08/11/2020   HGBA1C 10.2 (A) 03/11/2019   HGBA1C 10.3 (A) 09/15/2018   ESRSEDRATE 73 (H) 09/15/2018   ESRSEDRATE 44 (H) 07/11/2018   ESRSEDRATE 21 (H) 12/10/2016   CRP 2.7 (H) 07/11/2018   CRP 5.3 12/10/2016   REPTSTATUS 10/08/2018 FINAL 10/03/2018   CULT  10/03/2018    NO GROWTH 5 DAYS Performed at [facility], [place] [address] [place], [place] [address]    LABORGA  ESCHERICHIA COLI 01/12/2011     Lab Results  Component Value Date   ALBUMIN 3.9 08/11/2020   ALBUMIN 3.1 (L) 10/04/2018   ALBUMIN 3.5 10/02/2018    No results found for: MG No results found for: VD25OH  No results found for: PREALBUMIN CBC EXTENDED Latest Ref Rng & Units 08/11/2020 10/04/2018 10/03/2018  WBC 3.4 - 10.8 x10E3/uL 7.7 5.6 -  RBC 3.77 - 5.28 x10E6/uL 3.78 3.38(L) -  HGB 11.1 - 15.9 g/dL 10.4(L) 9.4(L) 12.6  HCT 34.0 - 46.6 % 30.4(L) 29.0(L) 37.0  PLT 150 - 450 x10E3/uL 230 193 -  NEUTROABS 1.7 - 7.7 K/uL - 2.9 -  LYMPHSABS 0.7 - 4.0 K/uL - 2.0 -     There is no height or weight on file to calculate BMI.  Orders:  No orders of the defined types were placed in this encounter.  No orders of the defined types were placed in this encounter.    Procedures: No procedures performed  Clinical Data: No additional findings.  ROS:  All other systems negative, except as noted in the HPI. Review of Systems  Objective: Vital Signs: There were no vitals taken for this visit.  Specialty Comments:  No specialty comments available.  PMFS History: Patient Active Problem List  Diagnosis Date Noted  . Influenza vaccine refused 08/11/2020  . Cervical myelopathy ([place]) 08/11/2020  . Closed fracture of odontoid process of axis ([place]) 06/11/2019  . Normochromic anemia 06/11/2019  . Confusion 11/19/2018  . Chronic migraine 11/19/2018  . Acute encephalopathy 10/03/2018  . AKI (acute kidney injury) ([place]) 10/03/2018  . Diabetic foot ulcer ([place]) 09/16/2018  . Diabetic foot infection ([place]) 07/12/2018  . Morbid obesity ([place]) 07/12/2018  . Toe infection 07/11/2018  . Muscle spasm of back 04/24/2017  . Osteoarthritis of both hips 01/15/2017  . Recurrent headache 01/06/2017  . Irregular menstrual cycle 05/30/2016  . Chronic pain syndrome 09/21/2015  . HYPERTRIGLYCERIDEMIA 05/02/2009  . 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 Insomnia 05/01/2009  . SCOLIOSIS 05/18/2008  . Diabetes  type 2, uncontrolled ([place]) 04/18/2008  . DEPRESSION 04/18/2008  . Essential hypertension, benign 04/18/2008   Past Medical History:  Diagnosis Date  . Arthritis   . GERD (gastroesophageal reflux disease)   . Headache    migraine 2 weeks ago  . Hyperlipidemia   . Hypertension associated with diabetes ([place])   . Renal disorder    has both kidneys, compacted and 'squashed together"  . Scoliosis   . Sleep apnea    in the midst of being tested for it.   waiting on scheduling   . Type 2 diabetes mellitus treated with insulin ([facility])    dx 2000    Family History  Problem Relation Age of Onset  . Cancer Maternal Grandmother        mandibular cancer   . Diabetes Mother   . Heart failure Mother   . CAD Mother        triple CABG  . Diabetes Maternal Aunt   . Stroke Father     Past Surgical History:  Procedure Laterality Date  . BACK SURGERY    . CERVICAL FUSION    . frontal closed sutures to forehead    . Harrington Rod placement    . HIP SURGERY     staph infection  . LUMBAR FUSION    . RADIOLOGY WITH ANESTHESIA N/A 02/25/2017   Procedure: RADIOLOGY WITH ANESTHESIA/MRI THORACIC SPINE AND LEFT SHOULDER WITHOUT CONTRAST.;  Surgeon: Radiologist, Medication, MD;  Location: [facility];  Service: Radiology;  Laterality: N/A;   Social History   Occupational History    Employer: UNEMPLOYED  Tobacco Use  . Smoking status: Never Smoker  . Smokeless tobacco: Never Used  Substance and Sexual Activity  . Alcohol use: No  . Drug use: No  . Sexual activity: Yes    Birth control/protection: Other-see comments    Comment: unable to have children d/t scoliosis

## 2020-09-26 ENCOUNTER — Encounter: Payer: Self-pay | Admitting: Obstetrics and Gynecology

## 2020-09-26 ENCOUNTER — Encounter: Payer: Medicaid Other | Admitting: Obstetrics and Gynecology

## 2020-09-27 NOTE — Progress Notes (Signed)
Patient did not keep her new GYN appointment for 09/26/2020.  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

## 2020-10-02 ENCOUNTER — Ambulatory Visit (INDEPENDENT_AMBULATORY_CARE_PROVIDER_SITE_OTHER): Payer: Medicaid Other | Admitting: Orthopedic Surgery

## 2020-10-02 DIAGNOSIS — I872 Venous insufficiency (chronic) (peripheral): Secondary | ICD-10-CM | POA: Diagnosis not present

## 2020-10-03 ENCOUNTER — Ambulatory Visit: Payer: Medicaid Other | Admitting: Orthopedic Surgery

## 2020-10-04 ENCOUNTER — Ambulatory Visit: Payer: Medicaid Other | Admitting: Pharmacist

## 2020-10-10 DIAGNOSIS — G894 Chronic pain syndrome: Secondary | ICD-10-CM | POA: Diagnosis not present

## 2020-10-10 DIAGNOSIS — Z6841 Body Mass Index (BMI) 40.0 and over, adult: Secondary | ICD-10-CM | POA: Diagnosis not present

## 2020-10-10 DIAGNOSIS — M542 Cervicalgia: Secondary | ICD-10-CM | POA: Diagnosis not present

## 2020-10-10 DIAGNOSIS — M549 Dorsalgia, unspecified: Secondary | ICD-10-CM | POA: Diagnosis not present

## 2020-10-10 DIAGNOSIS — E119 Type 2 diabetes mellitus without complications: Secondary | ICD-10-CM | POA: Diagnosis not present

## 2020-10-10 DIAGNOSIS — G8929 Other chronic pain: Secondary | ICD-10-CM | POA: Diagnosis not present

## 2020-10-10 DIAGNOSIS — Z79899 Other long term (current) drug therapy: Secondary | ICD-10-CM | POA: Diagnosis not present

## 2020-10-17 ENCOUNTER — Encounter: Payer: Self-pay | Admitting: Orthopedic Surgery

## 2020-10-17 NOTE — Progress Notes (Signed)
Office Visit Note   Patient: Briana Williams           Date of Birth: 1980/12/24           MRN: [id number] Visit Date: 10/02/2020              Requested by: Ladell Pier, MD [address] [place],  [place] [address] PCP: Ladell Pier, MD  Chief Complaint  Patient presents with  . Left Foot - Pain      HPI: Patient is a 40 year old woman with multiple medical problems she has had venous swelling in the left leg she states she took the compression wrap off this weekend after it slid down and she states she now has increased swelling and increased pain.  Patient states she is status post anterior cervical discectomy and fusion revision she states she is going to follow-up with her spine surgeon this week.  Patient states that she has had 45 surgeries in her lumbar and cervical spine secondary to scoliosis.  Assessment & Plan: Visit Diagnoses:  1. Venous stasis dermatitis of left lower extremity     Plan: Recommended knee-high compression stockings follow-up as needed.  Follow-Up Instructions: No follow-ups on file.   Ortho Exam  Patient is alert, oriented, no adenopathy, well-dressed, normal affect, normal respiratory effort. Patient currently takes oxycodone 10 mg 3 times a day as per her pain clinic.  With gait she has a significant kyphosis.  Patient does have a positive straight leg raise on the left dorsiflexion the ankle reproduces her sciatic pain there is no focal motor weakness.  She has no ulcers in the left leg no cellulitis she does have venous swelling.  Imaging: No results found. No images are attached to the encounter.  Labs: Lab Results  Component Value Date   HGBA1C 8.2 (A) 08/11/2020   HGBA1C 10.2 (A) 03/11/2019   HGBA1C 10.3 (A) 09/15/2018   ESRSEDRATE 73 (H) 09/15/2018   ESRSEDRATE 44 (H) 07/11/2018   ESRSEDRATE 21 (H) 12/10/2016   CRP 2.7 (H) 07/11/2018   CRP 5.3 12/10/2016   REPTSTATUS 10/08/2018 FINAL 10/03/2018   CULT   10/03/2018    NO GROWTH 5 DAYS Performed at [facility], [place] [address], [place], [place] [address]    LABORGA ESCHERICHIA COLI 01/12/2011     Lab Results  Component Value Date   ALBUMIN 3.9 08/11/2020   ALBUMIN 3.1 (L) 10/04/2018   ALBUMIN 3.5 10/02/2018    No results found for: MG No results found for: VD25OH  No results found for: PREALBUMIN CBC EXTENDED Latest Ref Rng & Units 08/11/2020 10/04/2018 10/03/2018  WBC 3.4 - 10.8 x10E3/uL 7.7 5.6 -  RBC 3.77 - 5.28 x10E6/uL 3.78 3.38(L) -  HGB 11.1 - 15.9 g/dL 10.4(L) 9.4(L) 12.6  HCT 34.0 - 46.6 % 30.4(L) 29.0(L) 37.0  PLT 150 - 450 x10E3/uL 230 193 -  NEUTROABS 1.7 - 7.7 K/uL - 2.9 -  LYMPHSABS 0.7 - 4.0 K/uL - 2.0 -     There is no height or weight on file to calculate BMI.  Orders:  No orders of the defined types were placed in this encounter.  No orders of the defined types were placed in this encounter.    Procedures: No procedures performed  Clinical Data: No additional findings.  ROS:  All other systems negative, except as noted in the HPI. Review of Systems  Objective: Vital Signs: There were no vitals taken for this visit.  Specialty  Comments:  No specialty comments available.  PMFS History: Patient Active Problem List   Diagnosis Date Noted  . Influenza vaccine refused 08/11/2020  . Cervical myelopathy ([place]) 08/11/2020  . Closed fracture of odontoid process of axis ([place]) 06/11/2019  . Normochromic anemia 06/11/2019  . Confusion 11/19/2018  . Chronic migraine 11/19/2018  . Acute encephalopathy 10/03/2018  . AKI (acute kidney injury) ([place]) 10/03/2018  . Diabetic foot ulcer ([place]) 09/16/2018  . Diabetic foot infection ([place]) 07/12/2018  . Morbid obesity ([place]) 07/12/2018  . Toe infection 07/11/2018  . Muscle spasm of back 04/24/2017  . Osteoarthritis of both hips 01/15/2017  . Recurrent headache 01/06/2017  . Irregular menstrual cycle 05/30/2016  . Chronic pain syndrome 09/21/2015  .  HYPERTRIGLYCERIDEMIA 05/02/2009  . 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 Insomnia 05/01/2009  . SCOLIOSIS 05/18/2008  . Diabetes type 2, uncontrolled ([place]) 04/18/2008  . DEPRESSION 04/18/2008  . Essential hypertension, benign 04/18/2008   Past Medical History:  Diagnosis Date  . Arthritis   . GERD (gastroesophageal reflux disease)   . Headache    migraine 2 weeks ago  . Hyperlipidemia   . Hypertension associated with diabetes ([place])   . Renal disorder    has both kidneys, compacted and 'squashed together"  . Scoliosis   . Sleep apnea    in the midst of being tested for it.   waiting on scheduling   . Type 2 diabetes mellitus treated with insulin ([facility])    dx 2000    Family History  Problem Relation Age of Onset  . Cancer Maternal Grandmother        mandibular cancer   . Diabetes Mother   . Heart failure Mother   . CAD Mother        triple CABG  . Diabetes Maternal Aunt   . Stroke Father     Past Surgical History:  Procedure Laterality Date  . BACK SURGERY    . CERVICAL FUSION    . frontal closed sutures to forehead    . Harrington Rod placement    . HIP SURGERY     staph infection  . LUMBAR FUSION    . RADIOLOGY WITH ANESTHESIA N/A 02/25/2017   Procedure: RADIOLOGY WITH ANESTHESIA/MRI THORACIC SPINE AND LEFT SHOULDER WITHOUT CONTRAST.;  Surgeon: Radiologist, Medication, MD;  Location: [facility];  Service: Radiology;  Laterality: N/A;   Social History   Occupational History    Employer: UNEMPLOYED  Tobacco Use  . Smoking status: Never Smoker  . Smokeless tobacco: Never Used  Substance and Sexual Activity  . Alcohol use: No  . Drug use: No  . Sexual activity: Yes    Birth control/protection: Other-see comments    Comment: unable to have children d/t scoliosis

## 2020-10-24 MED FILL — JANUVIA 100 MG TABLET: 100 | 90 days supply | Qty: 90 | Fill #0

## 2020-11-08 DIAGNOSIS — G8929 Other chronic pain: Secondary | ICD-10-CM | POA: Diagnosis not present

## 2020-11-08 DIAGNOSIS — Z09 Encounter for follow-up examination after completed treatment for conditions other than malignant neoplasm: Secondary | ICD-10-CM | POA: Diagnosis not present

## 2020-11-08 DIAGNOSIS — M4802 Spinal stenosis, cervical region: Secondary | ICD-10-CM | POA: Diagnosis not present

## 2020-11-08 DIAGNOSIS — G4700 Insomnia, unspecified: Secondary | ICD-10-CM | POA: Diagnosis not present

## 2020-11-08 DIAGNOSIS — G43909 Migraine, unspecified, not intractable, without status migrainosus: Secondary | ICD-10-CM | POA: Diagnosis not present

## 2020-11-08 DIAGNOSIS — Z79899 Other long term (current) drug therapy: Secondary | ICD-10-CM | POA: Diagnosis not present

## 2020-11-08 DIAGNOSIS — S12100G Unspecified displaced fracture of second cervical vertebra, subsequent encounter for fracture with delayed healing: Secondary | ICD-10-CM | POA: Diagnosis not present

## 2020-11-08 DIAGNOSIS — I10 Essential (primary) hypertension: Secondary | ICD-10-CM | POA: Diagnosis not present

## 2020-11-08 DIAGNOSIS — Z9889 Other specified postprocedural states: Secondary | ICD-10-CM | POA: Diagnosis not present

## 2020-11-08 DIAGNOSIS — Z794 Long term (current) use of insulin: Secondary | ICD-10-CM | POA: Diagnosis not present

## 2020-11-08 DIAGNOSIS — M419 Scoliosis, unspecified: Secondary | ICD-10-CM | POA: Diagnosis not present

## 2020-11-08 DIAGNOSIS — Z981 Arthrodesis status: Secondary | ICD-10-CM | POA: Diagnosis not present

## 2020-11-08 DIAGNOSIS — E119 Type 2 diabetes mellitus without complications: Secondary | ICD-10-CM | POA: Diagnosis not present

## 2020-11-08 DIAGNOSIS — M542 Cervicalgia: Secondary | ICD-10-CM | POA: Diagnosis not present

## 2020-11-09 DIAGNOSIS — G894 Chronic pain syndrome: Secondary | ICD-10-CM | POA: Diagnosis not present

## 2020-11-09 DIAGNOSIS — M549 Dorsalgia, unspecified: Secondary | ICD-10-CM | POA: Diagnosis not present

## 2020-11-09 DIAGNOSIS — M542 Cervicalgia: Secondary | ICD-10-CM | POA: Diagnosis not present

## 2020-11-09 DIAGNOSIS — Z79899 Other long term (current) drug therapy: Secondary | ICD-10-CM | POA: Diagnosis not present

## 2020-11-09 DIAGNOSIS — Z6841 Body Mass Index (BMI) 40.0 and over, adult: Secondary | ICD-10-CM | POA: Diagnosis not present

## 2020-11-09 DIAGNOSIS — G8929 Other chronic pain: Secondary | ICD-10-CM | POA: Diagnosis not present

## 2020-11-09 DIAGNOSIS — E119 Type 2 diabetes mellitus without complications: Secondary | ICD-10-CM | POA: Diagnosis not present

## 2020-11-11 ENCOUNTER — Other Ambulatory Visit: Payer: Self-pay | Admitting: Internal Medicine

## 2020-11-11 DIAGNOSIS — F419 Anxiety disorder, unspecified: Secondary | ICD-10-CM

## 2020-11-12 NOTE — Telephone Encounter (Signed)
Requested Prescriptions  Pending Prescriptions Disp Refills  . escitalopram (LEXAPRO) 20 MG tablet [Pharmacy Med Name: ESCITALOPRAM '20MG'$  TABLETS] 90 tablet 0    Sig: TAKE 1 TABLET(20 MG) BY MOUTH DAILY     Psychiatry:  Antidepressants - SSRI Passed - 11/11/2020  8:23 PM      Passed - Completed PHQ-2 or PHQ-9 in the last 360 days      Passed - Valid encounter within last 6 months    Recent Outpatient Visits          2 months ago Essential hypertension   [place], Annie Main L, RPH-CPP   3 months ago Type 2 diabetes mellitus with obesity [facility])   [place] Karle Plumber B, MD   1 year ago Type 2 diabetes mellitus without complication, with long-term current use of insulin ([place])   [place], [name], MD   1 year ago Type 2 diabetes mellitus without complication, with long-term current use of insulin [facility])   [place], MD   1 year ago Uncontrolled type 2 diabetes mellitus with hyperglycemia [facility])   [place], [name], [place]      Future Appointments            In 4 weeks Ladell Pier, MD [place]

## 2020-11-24 ENCOUNTER — Other Ambulatory Visit: Payer: Self-pay | Admitting: Internal Medicine

## 2020-11-24 DIAGNOSIS — F32A Depression, unspecified: Secondary | ICD-10-CM

## 2020-11-24 DIAGNOSIS — F419 Anxiety disorder, unspecified: Secondary | ICD-10-CM

## 2020-11-24 NOTE — Telephone Encounter (Signed)
Requested Prescriptions  Pending Prescriptions Disp Refills  . busPIRone (BUSPAR) 10 MG tablet [Pharmacy Med Name: BUSPIRONE '10MG'$  TABLETS] 90 tablet 1    Sig: TAKE 1 TABLET(10 MG) BY MOUTH THREE TIMES DAILY     Psychiatry: Anxiolytics/Hypnotics - Non-controlled Passed - 11/24/2020  9:21 AM      Passed - Valid encounter within last 6 months    Recent Outpatient Visits          2 months ago Essential hypertension   [place], Annie Main L, RPH-CPP   3 months ago Type 2 diabetes mellitus with obesity [facility])   [place] Karle Plumber B, MD   1 year ago Type 2 diabetes mellitus without complication, with long-term current use of insulin ([place])   [place], [name], MD   1 year ago Type 2 diabetes mellitus without complication, with long-term current use of insulin [facility])   [place], [name], MD   1 year ago Uncontrolled type 2 diabetes mellitus with hyperglycemia [facility])   [place], [name], [place]      Future Appointments            In 2 weeks Ladell Pier, MD [place]

## 2020-12-09 ENCOUNTER — Other Ambulatory Visit: Payer: Self-pay

## 2020-12-09 ENCOUNTER — Encounter (HOSPITAL_BASED_OUTPATIENT_CLINIC_OR_DEPARTMENT_OTHER): Payer: Self-pay | Admitting: *Deleted

## 2020-12-09 ENCOUNTER — Emergency Department (HOSPITAL_BASED_OUTPATIENT_CLINIC_OR_DEPARTMENT_OTHER): Payer: Medicaid Other

## 2020-12-09 ENCOUNTER — Emergency Department (HOSPITAL_BASED_OUTPATIENT_CLINIC_OR_DEPARTMENT_OTHER): Payer: Medicaid Other | Admitting: Radiology

## 2020-12-09 ENCOUNTER — Emergency Department (HOSPITAL_BASED_OUTPATIENT_CLINIC_OR_DEPARTMENT_OTHER)
Admission: EM | Admit: 2020-12-09 | Discharge: 2020-12-09 | Disposition: A | Discharge status: Home / Self Care | Payer: Medicaid Other | Attending: Emergency Medicine | Admitting: Emergency Medicine

## 2020-12-09 DIAGNOSIS — Z7984 Long term (current) use of oral hypoglycemic drugs: Secondary | ICD-10-CM | POA: Diagnosis not present

## 2020-12-09 DIAGNOSIS — I10 Essential (primary) hypertension: Secondary | ICD-10-CM | POA: Insufficient documentation

## 2020-12-09 DIAGNOSIS — E119 Type 2 diabetes mellitus without complications: Secondary | ICD-10-CM | POA: Diagnosis not present

## 2020-12-09 DIAGNOSIS — R030 Elevated blood-pressure reading, without diagnosis of hypertension: Secondary | ICD-10-CM

## 2020-12-09 DIAGNOSIS — M545 Low back pain, unspecified: Secondary | ICD-10-CM | POA: Diagnosis not present

## 2020-12-09 DIAGNOSIS — Z79899 Other long term (current) drug therapy: Secondary | ICD-10-CM | POA: Diagnosis not present

## 2020-12-09 DIAGNOSIS — Z794 Long term (current) use of insulin: Secondary | ICD-10-CM | POA: Insufficient documentation

## 2020-12-09 DIAGNOSIS — N200 Calculus of kidney: Secondary | ICD-10-CM | POA: Diagnosis not present

## 2020-12-09 DIAGNOSIS — G8929 Other chronic pain: Secondary | ICD-10-CM | POA: Insufficient documentation

## 2020-12-09 LAB — PREGNANCY, URINE: Preg Test, Ur: NEGATIVE

## 2020-12-09 MED ORDER — METHOCARBAMOL 500 MG PO TABS
1000.0000 mg | ORAL_TABLET | Freq: Once | ORAL | Status: AC
  Administered 2020-12-09: 1000 mg via ORAL
  Filled 2020-12-09: qty 2

## 2020-12-09 MED ORDER — METHOCARBAMOL 750 MG PO TABS: 750.0000 mg | ORAL_TABLET | Freq: Three times a day (TID) | ORAL | 0 refills | Status: DC | PRN

## 2020-12-09 MED ORDER — HYDROMORPHONE HCL 1 MG/ML IJ SOLN
1.0000 mg | Freq: Once | INTRAMUSCULAR | Status: AC
  Administered 2020-12-09: 1 mg via INTRAMUSCULAR
  Filled 2020-12-09: qty 1

## 2020-12-09 NOTE — ED Provider Notes (Signed)
[facility] EMERGENCY DEPT Provider Note   CSN: [id number] Arrival date & time: 12/09/20  1525     History Chief Complaint  Patient presents with  . Back Pain    [name] is a 40 y.o. female.  Patient c/o left-sided low back pain in the past couple days. Symptoms acute onset, moderate-severe, constant, dull, non radiating, worse w certain movements and positional changes. No recent injury or strain. No trauma or fall. Denies radicular pain or leg pain. No saddle area numbness or leg numbness/weakness. No problems w normal bowel or bladder function. No hematuria or dysuria. States on normal period now. No abnormal vaginal discharge or bleeding. No anterior pain, no abd or pelvic pain. No fever or chills. Notes hx scoliosis, ddd, and multiple prior neck and back surgeries. States has chronic pain, and current her doctors is adjusting her meds - denies taking anything for pain today.   The history is provided by the patient.  Back Pain Associated symptoms: no abdominal pain, no chest pain, no dysuria, no fever, no headaches, no numbness, no pelvic pain and no weakness        Past Medical History:  Diagnosis Date  . Arthritis   . GERD (gastroesophageal reflux disease)   . Headache    migraine 2 weeks ago  . Hyperlipidemia   . Hypertension associated with diabetes ([place])   . Renal disorder    has both kidneys, compacted and 'squashed together"  . Scoliosis   . Sleep apnea    in the midst of being tested for it.   waiting on scheduling   . Type 2 diabetes mellitus treated with insulin ([place])    dx 2000    Patient Active Problem List   Diagnosis Date Noted  . Influenza vaccine refused 08/11/2020  . Cervical myelopathy ([place]) 08/11/2020  . Closed fracture of odontoid process of axis ([place]) 06/11/2019  . Normochromic anemia 06/11/2019  . Confusion 11/19/2018  . Chronic migraine 11/19/2018  . Acute encephalopathy 10/03/2018  . AKI (acute kidney injury)  ([place]) 10/03/2018  . Diabetic foot ulcer ([place]) 09/16/2018  . Diabetic foot infection ([place]) 07/12/2018  . Morbid obesity ([place]) 07/12/2018  . Toe infection 07/11/2018  . Muscle spasm of back 04/24/2017  . Osteoarthritis of both hips 01/15/2017  . Recurrent headache 01/06/2017  . Irregular menstrual cycle 05/30/2016  . Chronic pain syndrome 09/21/2015  . HYPERTRIGLYCERIDEMIA 05/02/2009  . 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 Insomnia 05/01/2009  . SCOLIOSIS 05/18/2008  . Diabetes type 2, uncontrolled ([place]) 04/18/2008  . DEPRESSION 04/18/2008  . Essential hypertension, benign 04/18/2008    Past Surgical History:  Procedure Laterality Date  . BACK SURGERY    . CERVICAL FUSION    . frontal closed sutures to forehead    . Harrington Rod placement    . HIP SURGERY     staph infection  . LUMBAR FUSION    . RADIOLOGY WITH ANESTHESIA N/A 02/25/2017   Procedure: RADIOLOGY WITH ANESTHESIA/MRI THORACIC SPINE AND LEFT SHOULDER WITHOUT CONTRAST.;  Surgeon: Radiologist, Medication, MD;  Location: [facility];  Service: Radiology;  Laterality: N/A;     OB History    Gravida  0   Para  0   Term  0   Preterm  0   AB  0   Living  0     SAB  0   IAB  0   Ectopic  0   Multiple  0   Live Births  0  Family History  Problem Relation Age of Onset  . Cancer Maternal Grandmother        mandibular cancer   . Diabetes Mother   . Heart failure Mother   . CAD Mother        triple CABG  . Diabetes Maternal Aunt   . Stroke Father     Social History   Tobacco Use  . Smoking status: Never Smoker  . Smokeless tobacco: Never Used  Substance Use Topics  . Alcohol use: No  . Drug use: No    Home Medications Prior to Admission medications   Medication Sig Start Date End Date Taking? Authorizing Provider  ACCU-CHEK SOFTCLIX LANCETS lancets 1 each by Other route 3 (three) times daily. ICD 10 E11.9 09/21/15   [name], MD  albuterol (PROVENTIL HFA;VENTOLIN HFA) 108 (90  Base) MCG/ACT inhaler Inhale 1 puff into the lungs every 6 (six) hours as needed for wheezing or shortness of breath. 10/04/18   [name], MD  amitriptyline (ELAVIL) 25 MG tablet Take 1 tablet (25 mg total) by mouth at bedtime. 12/28/19   Ladell Pier, MD  atorvastatin (LIPITOR) 10 MG tablet Take 1 tablet (10 mg total) by mouth daily. 09/15/18   Ladell Pier, MD  Blood Glucose Monitoring Suppl (ACCU-CHEK AVIVA PLUS) w/Device KIT 1 Device by Does not apply route 3 (three) times daily after meals. ICD 10 E11.9 09/21/15   [name], MD  busPIRone (BUSPAR) 10 MG tablet TAKE 1 TABLET(10 MG) BY MOUTH THREE TIMES DAILY 11/24/20   Ladell Pier, MD  carvedilol (COREG) 12.5 MG tablet Take 1 tablet (12.5 mg total) by mouth 2 (two) times daily with a meal. 09/06/20   Ladell Pier, MD  Continuous Blood Gluc Sensor (FREESTYLE LIBRE SENSOR SYSTEM) MISC Change sensor Q 2 wks 09/16/18   Ladell Pier, MD  Erenumab-aooe (AIMOVIG) 70 MG/ML SOAJ Administer 1 mL under the skin every 30 days. Must be seen for further refills. 12/09/19   Marcial Pacas, MD  escitalopram (LEXAPRO) 20 MG tablet TAKE 1 TABLET(20 MG) BY MOUTH DAILY 11/12/20   Ladell Pier, MD  gabapentin (NEURONTIN) 100 MG capsule TAKE 1 CAPSULE(100 MG) BY MOUTH THREE TIMES DAILY 04/16/20   Ladell Pier, MD  gabapentin (NEURONTIN) 300 MG capsule Take 1 capsule (300 mg total) by mouth 3 (three) times daily. 3 times a day when necessary neuropathy pain 09/04/20   Newt Minion, MD  glucose blood (ACCU-CHEK AVIVA PLUS) test strip 1 each by Other route 3 (three) times daily. ICD 10 E11.9 09/21/15   [name], MD  hydrochlorothiazide (HYDRODIURIL) 25 MG tablet Take 1 tablet (25 mg total) by mouth daily. 06/11/19   Ladell Pier, MD  Insulin Glargine (LANTUS SOLOSTAR) 100 UNIT/ML Solostar Pen Inject 38 Units into the skin daily. 09/15/18   Ladell Pier, MD  Insulin Pen Needle (B-D ULTRAFINE III SHORT PEN) 31G X  8 MM MISC 1 application by Does not apply route daily. 01/02/17   [name], MD  Lancet Devices [facility]) lancets 1 each by Other route 3 (three) times daily. ICD 10 E11.9 09/21/15   [name], MD  lisinopril (ZESTRIL) 40 MG tablet Take 1 tablet (40 mg total) by mouth daily. 06/11/19   Ladell Pier, MD  NOVOLOG FLEXPEN 100 UNIT/ML FlexPen INJECT 15 UNITS INTO THE SKIN WITH THE TWO LARGEST MEALS OF THE DAY. 11/27/18   Ladell Pier, MD  ondansetron (ZOFRAN ODT)  4 MG disintegrating tablet Take 1 tablet (4 mg total) by mouth every 8 (eight) hours as needed. 03/11/19   Marcial Pacas, MD  sitaGLIPtin (JANUVIA) 100 MG tablet TAKE 1 TABLET (100 MG TOTAL) BY MOUTH DAILY. 08/11/20 08/11/21  Ladell Pier, MD  SUMAtriptan (IMITREX) 100 MG tablet Take 1 tablet (100 mg total) by mouth once as needed for up to 1 dose for migraine. May repeat in 2 hours if headache persists or recurs. 03/11/19   Marcial Pacas, MD  tiZANidine (ZANAFLEX) 4 MG tablet TAKE 1 TABLET BY MOUTH EVERY 8 HOURS AS NEEDED 02/21/20   Ladell Pier, MD  topiramate (TOPAMAX) 50 MG tablet Take 1 tablet (50 mg total) by mouth 2 (two) times daily. 03/11/19   Marcial Pacas, MD  triamcinolone cream (KENALOG) 0.1 % Apply 1 application topically 2 (two) times daily. 09/16/18   Ladell Pier, MD    Allergies    Hydrocodone, Metformin and related, Norvasc [amlodipine besylate], and Ultrasound gel  Review of Systems   Review of Systems  Constitutional: Negative for chills and fever.  HENT: Negative for sore throat.   Eyes: Negative for redness.  Respiratory: Negative for cough and shortness of breath.   Cardiovascular: Negative for chest pain.  Gastrointestinal: Negative for abdominal pain, nausea and vomiting.  Genitourinary: Negative for dysuria, flank pain, hematuria and pelvic pain.  Musculoskeletal: Positive for back pain. Negative for neck pain.  Skin: Negative for rash.  Neurological: Negative for weakness,  numbness and headaches.  Hematological: Does not bruise/bleed easily.  Psychiatric/Behavioral: Negative for confusion.    Physical Exam Updated Vital Signs BP (!) 189/101 (BP Location: Left Arm)   Pulse 94   Temp 98.4 F (36.9 C) (Oral)   Resp 20   LMP 12/06/2020   SpO2 100%   Physical Exam Vitals and nursing note reviewed.  Constitutional:      Appearance: Normal appearance. She is well-developed.  HENT:     Head: Atraumatic.     Nose: Nose normal.     Mouth/Throat:     Mouth: Mucous membranes are moist.  Eyes:     General: No scleral icterus.    Conjunctiva/sclera: Conjunctivae normal.  Neck:     Trachea: No tracheal deviation.  Cardiovascular:     Rate and Rhythm: Normal rate and regular rhythm.     Pulses: Normal pulses.     Heart sounds: Normal heart sounds. No murmur heard. No friction rub. No gallop.   Pulmonary:     Effort: Pulmonary effort is normal. No respiratory distress.     Breath sounds: Normal breath sounds.  Abdominal:     General: Bowel sounds are normal. There is no distension.     Palpations: Abdomen is soft. There is no mass.     Tenderness: There is no abdominal tenderness. There is no guarding.  Genitourinary:    Comments: No cva tenderness.  Musculoskeletal:        General: No swelling.     Cervical back: Neck supple. No muscular tenderness.     Comments: CTLS spine, non tender, aligned, no step off. Left lower thoracic and upper lumbar muscular/paraspinal area tenderness - also reproduced w positional changes/movement. No sts, redness or skin changes to area of pain.   Skin:    General: Skin is warm and dry.     Findings: No rash.  Neurological:     Mental Status: She is alert.     Comments: Alert, speech normal. Motor intact bil,  stre 5/5. Sens grossly intact. Straight leg raise neg.   Psychiatric:        Mood and Affect: Mood normal.     ED Results / Procedures / Treatments   Labs (all labs ordered are listed, but only abnormal  results are displayed) Labs Reviewed - No data to display  EKG None  Radiology DG Thoracic Spine 2 View  Result Date: 12/09/2020 CLINICAL DATA:  Left upper back pain. No known injury. History of scoliosis. EXAM: THORACIC SPINE 2 VIEWS COMPARISON:  Thoracic MRI 01/04/2019 FINDINGS: History of scoliotic curvature of the thoracic spine, also seen on prior exam. Right posterior rod in the lower thoracic spine. Cervicothoracic striation hardware is partially included. Upper thoracic spine is not included on the lateral view. Posterior spinal fusion was better appreciated on prior MRI. No obvious fracture compression deformity. No paravertebral soft tissue abnormality. IMPRESSION: Thoracic scoliosis and postsurgical change. No acute radiographic findings. Electronically Signed   By: Keith Rake M.D.   On: 12/09/2020 19:26   DG Lumbar Spine Complete  Result Date: 12/09/2020 CLINICAL DATA:  Pain. EXAM: LUMBAR SPINE - COMPLETE 4+ VIEW COMPARISON:  Lumbar radiograph 08/31/2018 FINDINGS: Lumbar levoscoliosis. Extensive lumbar fusion. Remnant tearing 10 red at the thoracolumbar junction on the right, and at the level of L4 on the left. There is no obvious fracture or compression deformity. No evidence of bone destruction. IMPRESSION: 1. Scoliotic curvature with postsurgical change in the lumbar spine. 2. No acute radiographic findings. Electronically Signed   By: Keith Rake M.D.   On: 12/09/2020 19:27   CT Renal Stone Study  Result Date: 12/09/2020 CLINICAL DATA:  Flank pain, left upper back, history of scoliosis EXAM: CT ABDOMEN AND PELVIS WITHOUT CONTRAST TECHNIQUE: Multidetector CT imaging of the abdomen and pelvis was performed following the standard protocol without IV contrast. COMPARISON:  CT abdomen pelvis, 01/12/2011, MR pelvis, 11/06/2017 FINDINGS: Lower chest: No acute abnormality. Hepatobiliary: No solid liver abnormality is seen. Sludge in the gallbladder. No discrete gallstones.  Gallbladder wall thickening, or biliary dilatation. Pancreas: Unremarkable. No pancreatic ductal dilatation or surrounding inflammatory changes. Spleen: Normal in size without significant abnormality. 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 Punctuate, nonobstructive calculi of the inferior pole of the right kidney. No ureteral calculi, left-sided calculi, or hydronephrosis. Bladder is unremarkable. Stomach/Bowel: Stomach is within normal limits. Appendix appears normal. No evidence of bowel wall thickening, distention, or inflammatory changes. Vascular/Lymphatic: Scattered aortic atherosclerosis. No enlarged abdominal or pelvic lymph nodes. Reproductive: The left ovary is enlarged, measuring 5.6 x 3.7 cm, of soft tissue attenuation. This appearance is not significantly changed compared to prior MR dated 11/06/2017. Other: No abdominal wall hernia or abnormality. No abdominopelvic ascites. Musculoskeletal: Levoscoliosis of the thoracolumbar spine, status post partial rod fusion, with ankylosis throughout the included spine. IMPRESSION: 1. No acute noncontrast CT findings of the abdomen or pelvis to explain left flank or upper back pain. 2. Punctuate, nonobstructive calculi of the inferior pole of the right kidney. No ureteral calculi, left-sided calculi, or hydronephrosis. 3. The left ovary is enlarged, measuring 5.6 x 3.7 cm, of soft tissue attenuation. This appearance is not significantly changed compared to prior MR dated 11/06/2017 and benign. 4. Sludge in the gallbladder without discrete calculi identified. No evidence of acute cholecystitis. 5. Levoscoliosis of the thoracolumbar spine, status post partial rod fusion, with ankylosis throughout the included spine. Aortic Atherosclerosis (ICD10-I70.0). Electronically Signed   By: Eddie Candle  M.D.   On: 12/09/2020 20:49    Procedures Procedures   Medications  Ordered in ED Medications  HYDROmorphone (DILAUDID) injection 1 mg (has no administration in time range)    ED Course  I have reviewed the triage vital signs and the nursing notes.  Pertinent labs & imaging results that were available during my care of the patient were reviewed by me and considered in my medical decision making (see chart for details).    MDM Rules/Calculators/A&P                          Imaging ordered.   Reviewed nursing notes and prior charts for additional history.   Dilaudid 1 mg im.\  Labs reviewed/interpreted by me - preg neg.   Xrays reviewed/interpreted by me - neg acute. Similar to prior.   CT reviewed/interpreted by me - no left ureteral stone.   Incidental findings noted on imaging relayed to patient. rec pcp and back specialty f/u.  Pt requests med for muscle spasm - robaxin given.   Pt currently appears comfortable and stable for d/c.   Return precautions provided.     Final Clinical Impression(s) / ED Diagnoses Final diagnoses:  None    Rx / DC Orders ED Discharge Orders    None       Lajean Saver, MD 12/09/20 2135

## 2020-12-09 NOTE — ED Notes (Signed)
Pt verbalizes understanding of discharge instructions. Opportunity for questioning and answers were provided. [place] removed by staff, pt discharged from ED to home. Instructed to follow up with PCP and specialists. Rx given.

## 2020-12-09 NOTE — Discharge Instructions (Addendum)
It was our pleasure to provide your ER care today - we hope that you feel better.  Rest. Avoid bending at waist, or heavy lifting > 10 lbs for the next week. Try heat therapy and/or gentle massage to sore area. Take your pain meds as need. You may take robaxin as need for muscle pain/spasm - no driving when taking.   Your CT scan was read as showing: 1. No acute noncontrast CT findings of the abdomen or pelvis to explain left flank or back pain.  2. Punctuate, nonobstructive calculi of the inferior pole of the right kidney.3. The left ovary is enlarged, measuring 5.6 x 3.7 cm, of soft  tissue attenuation. This appearance is not significantly changed  compared to prior MR dated 11/06/2017 and benign. 4. Sludge in the gallbladder without discrete calculi identified. No evidence of acute cholecystitis. 5. Levoscoliosis of the thoracolumbar spine, status post partial rod fusion, with ankylosis throughout the included spine.  6. Aortic Atherosclerosis  Follow up with primary care doctor in one week - discuss above findings with them. Also have them recheck your blood pressure as it is high today.   Also follow up with back specialist in the next 1-2 weeks - call office to arrange appointment.   Return to ER if worse, new symptoms, intractable pain, numbness/weakness, fevers, problems with bowel/bladder function, or other concern.

## 2020-12-09 NOTE — ED Triage Notes (Signed)
Patient reports constant sharp stabbing pain left upper back x 1.5 days--increase with movement. Denies any injury or movement that may of caused. Patient with hx of scoliosis. Denies numbness or tingling to legs. Reports increase in pain with walking.

## 2020-12-09 NOTE — ED Notes (Signed)
ED Provider at bedside. 

## 2020-12-10 ENCOUNTER — Encounter: Payer: Self-pay | Admitting: Internal Medicine

## 2020-12-10 DIAGNOSIS — R809 Proteinuria, unspecified: Secondary | ICD-10-CM | POA: Insufficient documentation

## 2020-12-10 DIAGNOSIS — D631 Anemia in chronic kidney disease: Secondary | ICD-10-CM | POA: Insufficient documentation

## 2020-12-10 DIAGNOSIS — N189 Chronic kidney disease, unspecified: Secondary | ICD-10-CM | POA: Insufficient documentation

## 2020-12-11 ENCOUNTER — Ambulatory Visit: Payer: Medicaid Other | Admitting: Internal Medicine

## 2020-12-21 DIAGNOSIS — E119 Type 2 diabetes mellitus without complications: Secondary | ICD-10-CM | POA: Diagnosis not present

## 2020-12-21 DIAGNOSIS — G894 Chronic pain syndrome: Secondary | ICD-10-CM | POA: Diagnosis not present

## 2020-12-21 DIAGNOSIS — M542 Cervicalgia: Secondary | ICD-10-CM | POA: Diagnosis not present

## 2020-12-21 DIAGNOSIS — M549 Dorsalgia, unspecified: Secondary | ICD-10-CM | POA: Diagnosis not present

## 2020-12-21 DIAGNOSIS — G8929 Other chronic pain: Secondary | ICD-10-CM | POA: Diagnosis not present

## 2020-12-21 DIAGNOSIS — Z6841 Body Mass Index (BMI) 40.0 and over, adult: Secondary | ICD-10-CM | POA: Diagnosis not present

## 2020-12-21 DIAGNOSIS — R030 Elevated blood-pressure reading, without diagnosis of hypertension: Secondary | ICD-10-CM | POA: Diagnosis not present

## 2020-12-21 DIAGNOSIS — Z79899 Other long term (current) drug therapy: Secondary | ICD-10-CM | POA: Diagnosis not present

## 2020-12-24 ENCOUNTER — Encounter: Payer: Self-pay | Admitting: Internal Medicine

## 2021-01-01 ENCOUNTER — Encounter: Payer: Self-pay | Admitting: Internal Medicine

## 2021-01-02 ENCOUNTER — Other Ambulatory Visit: Payer: Self-pay | Admitting: Internal Medicine

## 2021-01-02 MED ORDER — ACCU-CHEK GUIDE VI STRP: ORAL_STRIP | 12 refills | Status: AC

## 2021-01-02 MED ORDER — ACCU-CHEK SOFTCLIX LANCETS MISC: 12 refills | Status: AC

## 2021-01-02 MED ORDER — ACCU-CHEK GUIDE W/DEVICE KIT: PACK | 0 refills | Status: AC

## 2021-01-13 ENCOUNTER — Telehealth: Payer: Self-pay | Admitting: Internal Medicine

## 2021-01-13 DIAGNOSIS — G959 Disease of spinal cord, unspecified: Secondary | ICD-10-CM

## 2021-01-13 NOTE — Telephone Encounter (Signed)
-----   Message from Ena Dawley sent at 01/12/2021  7:17 PM EDT ----- Regarding: RE: Physical therapy referral Can you place a new  referral  thank you  .   ----- Message ----- From: Ladell Pier, MD Sent: 01/10/2021   2:31 PM EDT To: Ena Dawley Subject: Physical therapy referral                      Can you please check on her P.T referral?

## 2021-01-15 ENCOUNTER — Other Ambulatory Visit: Payer: Self-pay | Admitting: Internal Medicine

## 2021-01-15 DIAGNOSIS — G4700 Insomnia, unspecified: Secondary | ICD-10-CM

## 2021-01-23 ENCOUNTER — Telehealth: Payer: Self-pay | Admitting: Internal Medicine

## 2021-01-23 DIAGNOSIS — G8929 Other chronic pain: Secondary | ICD-10-CM | POA: Diagnosis not present

## 2021-01-23 DIAGNOSIS — Z6841 Body Mass Index (BMI) 40.0 and over, adult: Secondary | ICD-10-CM | POA: Diagnosis not present

## 2021-01-23 DIAGNOSIS — E119 Type 2 diabetes mellitus without complications: Secondary | ICD-10-CM | POA: Diagnosis not present

## 2021-01-23 DIAGNOSIS — G894 Chronic pain syndrome: Secondary | ICD-10-CM | POA: Diagnosis not present

## 2021-01-23 DIAGNOSIS — M542 Cervicalgia: Secondary | ICD-10-CM | POA: Diagnosis not present

## 2021-01-23 DIAGNOSIS — R030 Elevated blood-pressure reading, without diagnosis of hypertension: Secondary | ICD-10-CM | POA: Diagnosis not present

## 2021-01-23 DIAGNOSIS — M549 Dorsalgia, unspecified: Secondary | ICD-10-CM | POA: Diagnosis not present

## 2021-01-23 DIAGNOSIS — Z79899 Other long term (current) drug therapy: Secondary | ICD-10-CM | POA: Diagnosis not present

## 2021-01-23 NOTE — Telephone Encounter (Signed)
Copied from [place] [id number]. Topic: General - Other >> Jan 22, 2021  1:12 PM Tessa Lerner A wrote: Reason for CRM: Briana Williams with [facility] has called on behalf of patient  Patient needs to be contacted regarding being fitted for their wheelchair   The patient shares that a referral was sent on their behalf but they're uncertain of to where it was sent and have not been contacted yet  Please contact to further advise when possible

## 2021-01-23 NOTE — Telephone Encounter (Signed)
Opal Sidles would you be able to help with this

## 2021-01-25 ENCOUNTER — Telehealth: Payer: Self-pay | Admitting: Internal Medicine

## 2021-01-25 NOTE — Telephone Encounter (Signed)
-----   Message from Ena Dawley sent at 01/25/2021  9:46 AM EDT ----- Regarding: [facility] Good [facility]  Dr Wynetta Emery   I contacted  [facility]  and  they said that patient is in the waiting list for wheel chair evaluation   ----- Message ----- From: Ladell Pier, MD Sent: 01/18/2021   8:46 AM EDT To: Ena Dawley  Please check on pt's P.T referral.

## 2021-02-01 NOTE — Telephone Encounter (Signed)
Dr. Wynetta Emery placed referral on 01/13/21. Referral was trasnferred on 01/15/21   Transferring referral to [place] for wheelchair eval

## 2021-02-02 ENCOUNTER — Other Ambulatory Visit: Payer: Self-pay

## 2021-02-02 MED FILL — Sitagliptin Phosphate Tab 100 MG (Base Equiv): ORAL | 30 days supply | Qty: 30 | Fill #0 | Status: CN

## 2021-02-08 NOTE — Telephone Encounter (Signed)
Sherri, [facility] called in about waiting list for status of wheel chair. Please call back

## 2021-02-09 ENCOUNTER — Other Ambulatory Visit: Payer: Self-pay

## 2021-02-11 ENCOUNTER — Other Ambulatory Visit: Payer: Self-pay | Admitting: Internal Medicine

## 2021-02-11 DIAGNOSIS — G4700 Insomnia, unspecified: Secondary | ICD-10-CM

## 2021-02-11 MED FILL — Sitagliptin Phosphate Tab 100 MG (Base Equiv): ORAL | 30 days supply | Qty: 30 | Fill #0 | Status: CN

## 2021-02-11 NOTE — Telephone Encounter (Signed)
Requested medication (s) are due for refill today: yes  Requested medication (s) are on the active medication list: yes  Last refill:  01/15/21  Future visit scheduled: yes  Notes to clinic:  note attached :pt must keep appt for further refills   Requested Prescriptions  Pending Prescriptions Disp Refills   amitriptyline (ELAVIL) 25 MG tablet [Pharmacy Med Name: AMITRIPTYLINE '25MG'$  TABLETS] 30 tablet 0    Sig: TAKE 1 TABLET(25 MG) BY MOUTH AT BEDTIME      Psychiatry:  Antidepressants - Heterocyclics (TCAs) Passed - 02/11/2021  1:58 PM      Passed - Completed PHQ-2 or PHQ-9 in the last 360 days      Passed - Valid encounter within last 6 months    Recent Outpatient Visits           5 months ago Essential hypertension   [place], Annie Main L, RPH-CPP   6 months ago Type 2 diabetes mellitus with obesity [facility])   [place], [name], MD   1 year ago Type 2 diabetes mellitus without complication, with long-term current use of insulin ([place])   [place], [name], MD   1 year ago Type 2 diabetes mellitus without complication, with long-term current use of insulin [facility])   [place] Ladell Pier, MD   1 year ago Uncontrolled type 2 diabetes mellitus with hyperglycemia [facility]   [place], [name], [place]       Future Appointments             In 1 week Ladell Pier, MD [place]

## 2021-02-12 ENCOUNTER — Other Ambulatory Visit: Payer: Self-pay

## 2021-02-19 ENCOUNTER — Encounter: Payer: Self-pay | Admitting: Internal Medicine

## 2021-02-19 ENCOUNTER — Other Ambulatory Visit: Payer: Self-pay

## 2021-02-19 ENCOUNTER — Ambulatory Visit: Payer: Medicaid Other | Attending: Internal Medicine | Admitting: Internal Medicine

## 2021-02-19 DIAGNOSIS — E1169 Type 2 diabetes mellitus with other specified complication: Secondary | ICD-10-CM

## 2021-02-19 DIAGNOSIS — M4125 Other idiopathic scoliosis, thoracolumbar region: Secondary | ICD-10-CM | POA: Diagnosis not present

## 2021-02-19 DIAGNOSIS — N920 Excessive and frequent menstruation with regular cycle: Secondary | ICD-10-CM | POA: Diagnosis not present

## 2021-02-19 DIAGNOSIS — I10 Essential (primary) hypertension: Secondary | ICD-10-CM

## 2021-02-19 DIAGNOSIS — E1121 Type 2 diabetes mellitus with diabetic nephropathy: Secondary | ICD-10-CM | POA: Insufficient documentation

## 2021-02-19 DIAGNOSIS — E785 Hyperlipidemia, unspecified: Secondary | ICD-10-CM

## 2021-02-19 DIAGNOSIS — N838 Other noninflammatory disorders of ovary, fallopian tube and broad ligament: Secondary | ICD-10-CM

## 2021-02-19 NOTE — Progress Notes (Signed)
Patient ID: Briana Williams, female   DOB: 1981/07/23, 40 y.o.   MRN: [id number] Virtual Visit via Telephone Note  I connected with Briana Williams on 02/19/2021 at 4:21 PM by telephone and verified that I am speaking with the correct person using two identifiers  Location: Patient: Grocery store.  Patient was able to get into a private place to have this visit. Provider: office  Participants: Myself Patient   I discussed the limitations, risks, security and privacy concerns of performing an evaluation and management service by telephone and the availability of in person appointments. I also discussed with the patient that there may be a patient responsible charge related to this service. The patient expressed understanding and agreed to proceed.   History of Present Illness: Hx of scoliosis (multiple back surgeries), HTN, insomnia, DM2, depression,anxiety migraines. 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  Last seen in February of this year.  This is for chronic disease management.  DM: Reports that her blood sugars have been running less than 140 for the past 4 months.  She checks blood sugars about 3 times a week.  She is eating less and eating more fruits.  Reports compliance with Lantus, NovoLog and Januvia. -On urine microalbumin done on last visit, she had greater than 5 g of protein in the urine.  I referred her to the nephrologist. Has 1st appt coming up this wk.  HTN: Checks blood pressure 1-2 times a week.  She reports that her readings have been less than 130/80.  She does not have her log book with her at this time.  She limits salt in the foods.  Reports compliance with her medications as listed below including carvedilol, hydrochlorothiazide and lisinopril.  Menorrhagia: Referred to GYN on last visit.  She no showed for that appointment.  Patient states she was never called about the appointment.  Menses remain heavy.  Seen in the emergency room in May.  Had renal CAT scan that showed  incidental finding of enlarged left ovary.  Radiologist states that it has not significantly changed compared to prior MRI dated back to 10/2017.  She reports that the joints all over her body hurt.  She has scoliosis and feels that it is worse despite the surgeries that she has had.  She states she was told by the neurosurgeon that she needs to have surgery on her lower back.  She would like to be evaluated for motorized wheelchair.  I had referred her to physical therapy 2 times since the beginning of this year.  Last month we were told that she is on a waiting list due to staffing shortage.  HL: Reports compliance with taking atorvastatin. Outpatient Encounter Medications as of 02/19/2021  Medication Sig   ACCU-CHEK SOFTCLIX LANCETS lancets 1 each by Other route 3 (three) times daily. ICD 10 E11.9   Accu-Chek Softclix Lancets lancets Use as instructed   albuterol (PROVENTIL HFA;VENTOLIN HFA) 108 (90 Base) MCG/ACT inhaler Inhale 1 puff into the lungs every 6 (six) hours as needed for wheezing or shortness of breath.   amitriptyline (ELAVIL) 25 MG tablet TAKE 1 TABLET(25 MG) BY MOUTH AT BEDTIME   atorvastatin (LIPITOR) 10 MG tablet Take 1 tablet (10 mg total) by mouth daily.   Blood Glucose Monitoring Suppl (ACCU-CHEK AVIVA PLUS) w/Device KIT 1 Device by Does not apply route 3 (three) times daily after meals. ICD 10 E11.9   Blood Glucose Monitoring Suppl (ACCU-CHEK GUIDE) w/Device KIT Use as directed   busPIRone (BUSPAR) 10  MG tablet TAKE 1 TABLET(10 MG) BY MOUTH THREE TIMES DAILY   carvedilol (COREG) 12.5 MG tablet Take 1 tablet (12.5 mg total) by mouth 2 (two) times daily with a meal.   Erenumab-aooe (AIMOVIG) 70 MG/ML SOAJ Administer 1 mL under the skin every 30 days. Must be seen for further refills.   escitalopram (LEXAPRO) 20 MG tablet TAKE 1 TABLET(20 MG) BY MOUTH DAILY   gabapentin (NEURONTIN) 100 MG capsule TAKE 1 CAPSULE(100 MG) BY MOUTH THREE TIMES DAILY   gabapentin (NEURONTIN) 300 MG  capsule Take 1 capsule (300 mg total) by mouth 3 (three) times daily. 3 times a day when necessary neuropathy pain   glucose blood (ACCU-CHEK AVIVA PLUS) test strip 1 each by Other route 3 (three) times daily. ICD 10 E11.9   glucose blood (ACCU-CHEK GUIDE) test strip Test 3x/day before meals   hydrochlorothiazide (HYDRODIURIL) 25 MG tablet Take 1 tablet (25 mg total) by mouth daily.   Insulin Glargine (LANTUS SOLOSTAR) 100 UNIT/ML Solostar Pen Inject 38 Units into the skin daily.   Insulin Pen Needle (B-D ULTRAFINE III SHORT PEN) 31G X 8 MM MISC 1 application by Does not apply route daily.   Lancet Devices (ACCU-CHEK SOFTCLIX) lancets 1 each by Other route 3 (three) times daily. ICD 10 E11.9   lisinopril (ZESTRIL) 40 MG tablet Take 1 tablet (40 mg total) by mouth daily.   methocarbamol (ROBAXIN) 750 MG tablet Take 1 tablet (750 mg total) by mouth 3 (three) times daily as needed (muscle spasm/pain).   NOVOLOG FLEXPEN 100 UNIT/ML FlexPen INJECT 15 UNITS INTO THE SKIN WITH THE TWO LARGEST MEALS OF THE DAY.   ondansetron (ZOFRAN ODT) 4 MG disintegrating tablet Take 1 tablet (4 mg total) by mouth every 8 (eight) hours as needed.   sitaGLIPtin (JANUVIA) 100 MG tablet TAKE 1 TABLET (100 MG TOTAL) BY MOUTH DAILY.   SUMAtriptan (IMITREX) 100 MG tablet Take 1 tablet (100 mg total) by mouth once as needed for up to 1 dose for migraine. May repeat in 2 hours if headache persists or recurs.   tiZANidine (ZANAFLEX) 4 MG tablet TAKE 1 TABLET BY MOUTH EVERY 8 HOURS AS NEEDED   topiramate (TOPAMAX) 50 MG tablet Take 1 tablet (50 mg total) by mouth 2 (two) times daily.   triamcinolone cream (KENALOG) 0.1 % Apply 1 application topically 2 (two) times daily.   No facility-administered encounter medications on file as of 02/19/2021.      Observations/Objective: Lab Results  Component Value Date   HGBA1C 8.2 (A) 08/11/2020     Chemistry      Component Value Date/Time   NA 137 08/11/2020 1632   K 4.6  08/11/2020 1632   CL 102 08/11/2020 1632   CO2 23 08/11/2020 1632   BUN 20 08/11/2020 1632   CREATININE 1.40 (H) 08/11/2020 1632   CREATININE 0.59 09/21/2015 1740      Component Value Date/Time   CALCIUM 9.1 08/11/2020 1632   ALKPHOS 87 08/11/2020 1632   AST 19 08/11/2020 1632   ALT 26 08/11/2020 1632   BILITOT 0.3 08/11/2020 1632     Lab Results  Component Value Date   WBC 7.7 08/11/2020   HGB 10.4 (L) 08/11/2020   HCT 30.4 (L) 08/11/2020   MCV 80 08/11/2020   PLT 230 08/11/2020     Assessment and Plan: 1. 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  She will come to the lab to have  A1c done.  Encouraged her to continue trying to eat healthy. -Continue insulins and Januvia Keep appt this wk with nephrology. - Hemoglobin A1c; Future  2. Essential hypertension Patient reports home blood pressure readings have been good.  She will continue current medications and low-salt diet.  3. Hyperlipidemia associated with type 2 diabetes mellitus ([place]) Continue atorvastatin  4. Menorrhagia with regular cycle Patient is agreeable for me to resubmit referral to the gynecologist. - Ambulatory referral to Gynecology  5. Left ovarian enlargement - Ambulatory referral to Gynecology  6. Other idiopathic scoliosis, thoracolumbar region I will check with our referral coordinator again about getting her the appointment with the physical therapist for motorized wheelchair   Follow Up Instructions: 4 mths   I discussed the assessment and treatment plan with the patient. The patient was provided an opportunity to ask questions and all were answered. The patient agreed with the plan and demonstrated an understanding of the instructions.   The patient was advised to call back or seek an in-person evaluation if the symptoms worsen or if the condition fails to improve as anticipated.  I  Spent 13 minutes on  this telephone encounter  Karle Plumber, MD

## 2021-02-20 DIAGNOSIS — M549 Dorsalgia, unspecified: Secondary | ICD-10-CM | POA: Diagnosis not present

## 2021-02-20 DIAGNOSIS — E119 Type 2 diabetes mellitus without complications: Secondary | ICD-10-CM | POA: Diagnosis not present

## 2021-02-20 DIAGNOSIS — M542 Cervicalgia: Secondary | ICD-10-CM | POA: Diagnosis not present

## 2021-02-20 DIAGNOSIS — G894 Chronic pain syndrome: Secondary | ICD-10-CM | POA: Diagnosis not present

## 2021-02-20 DIAGNOSIS — E559 Vitamin D deficiency, unspecified: Secondary | ICD-10-CM | POA: Diagnosis not present

## 2021-02-20 DIAGNOSIS — R030 Elevated blood-pressure reading, without diagnosis of hypertension: Secondary | ICD-10-CM | POA: Diagnosis not present

## 2021-02-20 DIAGNOSIS — G8929 Other chronic pain: Secondary | ICD-10-CM | POA: Diagnosis not present

## 2021-02-20 DIAGNOSIS — Z79899 Other long term (current) drug therapy: Secondary | ICD-10-CM | POA: Diagnosis not present

## 2021-02-20 DIAGNOSIS — Z6841 Body Mass Index (BMI) 40.0 and over, adult: Secondary | ICD-10-CM | POA: Diagnosis not present

## 2021-02-27 ENCOUNTER — Other Ambulatory Visit: Payer: Self-pay

## 2021-02-27 ENCOUNTER — Telehealth: Payer: Self-pay | Admitting: Internal Medicine

## 2021-02-27 DIAGNOSIS — M4125 Other idiopathic scoliosis, thoracolumbar region: Secondary | ICD-10-CM

## 2021-02-27 MED FILL — Sitagliptin Phosphate Tab 100 MG (Base Equiv): ORAL | 30 days supply | Qty: 30 | Fill #0 | Status: CN

## 2021-02-27 NOTE — Telephone Encounter (Signed)
Referral Request - Has patient seen PCP for this complaint? Yes.    Referral for which specialty: Wheelchair  Preferred provider/office: [facility] refer num:[phone] attention Jori Moll  Reason for referral: To get a wheelchair

## 2021-02-27 NOTE — Telephone Encounter (Signed)
Will forward to provider  

## 2021-02-28 ENCOUNTER — Encounter: Payer: Self-pay | Admitting: Internal Medicine

## 2021-02-28 ENCOUNTER — Other Ambulatory Visit: Payer: Self-pay | Admitting: Internal Medicine

## 2021-02-28 MED ORDER — VITAMIN D (ERGOCALCIFEROL) 1.25 MG (50000 UNIT) PO CAPS: 50000.0000 [IU] | ORAL_CAPSULE | ORAL | 1 refills | Status: DC

## 2021-02-28 NOTE — Progress Notes (Signed)
I received lab results from Briana Anton, NP at [facility] pain clinic.  Labs were done 02/20/2021. Creatinine 1.6, GFR 44 Vitamin D level 12.8 Hemoglobin 10.8, hematocrit 32, platelet count of 207 Patient hemoglobin is stable.  She has an appointment with nephrology for kidney function. Message sent to patient via MyChart recommending high-dose vitamin D to take once a week.

## 2021-02-28 NOTE — Telephone Encounter (Signed)
Order placed to [facility] as requested by pt.

## 2021-03-06 ENCOUNTER — Other Ambulatory Visit: Payer: Self-pay

## 2021-03-22 ENCOUNTER — Ambulatory Visit: Payer: Medicaid Other | Attending: Internal Medicine

## 2021-03-22 ENCOUNTER — Other Ambulatory Visit: Payer: Self-pay

## 2021-03-22 DIAGNOSIS — M549 Dorsalgia, unspecified: Secondary | ICD-10-CM | POA: Diagnosis not present

## 2021-03-22 DIAGNOSIS — E1121 Type 2 diabetes mellitus with diabetic nephropathy: Secondary | ICD-10-CM | POA: Diagnosis not present

## 2021-03-22 DIAGNOSIS — R030 Elevated blood-pressure reading, without diagnosis of hypertension: Secondary | ICD-10-CM | POA: Diagnosis not present

## 2021-03-22 DIAGNOSIS — E119 Type 2 diabetes mellitus without complications: Secondary | ICD-10-CM | POA: Diagnosis not present

## 2021-03-22 DIAGNOSIS — M542 Cervicalgia: Secondary | ICD-10-CM | POA: Diagnosis not present

## 2021-03-22 DIAGNOSIS — G8929 Other chronic pain: Secondary | ICD-10-CM | POA: Diagnosis not present

## 2021-03-22 DIAGNOSIS — Z79899 Other long term (current) drug therapy: Secondary | ICD-10-CM | POA: Diagnosis not present

## 2021-03-22 DIAGNOSIS — Z6841 Body Mass Index (BMI) 40.0 and over, adult: Secondary | ICD-10-CM | POA: Diagnosis not present

## 2021-03-22 DIAGNOSIS — G894 Chronic pain syndrome: Secondary | ICD-10-CM | POA: Diagnosis not present

## 2021-03-23 LAB — HEMOGLOBIN A1C
Est. average glucose Bld gHb Est-mCnc: 217 mg/dL
Hgb A1c MFr Bld: 9.2 % — ABNORMAL HIGH (ref 4.8–5.6)

## 2021-03-28 ENCOUNTER — Encounter: Payer: Self-pay | Admitting: Internal Medicine

## 2021-03-28 NOTE — Progress Notes (Signed)
Labs results received from [facility] pain clinic.  Labs were drawn 02/20/2021. Creatinine 1.6, BUN 21, calcium 8.7.  Liver function test normal. Vitamin D level 12.8 H/H10.8/32.3

## 2021-04-04 ENCOUNTER — Ambulatory Visit: Payer: Medicaid Other | Admitting: Orthopaedic Surgery

## 2021-04-05 ENCOUNTER — Other Ambulatory Visit: Payer: Self-pay | Admitting: Internal Medicine

## 2021-04-05 DIAGNOSIS — G4700 Insomnia, unspecified: Secondary | ICD-10-CM

## 2021-04-09 ENCOUNTER — Other Ambulatory Visit (HOSPITAL_COMMUNITY)
Admission: RE | Admit: 2021-04-09 | Discharge: 2021-04-09 | Disposition: A | Discharge status: Home / Self Care | Payer: Medicaid Other | Source: Ambulatory Visit | Attending: Obstetrics & Gynecology | Admitting: Obstetrics & Gynecology

## 2021-04-09 ENCOUNTER — Other Ambulatory Visit: Payer: Self-pay

## 2021-04-09 ENCOUNTER — Encounter (HOSPITAL_BASED_OUTPATIENT_CLINIC_OR_DEPARTMENT_OTHER): Payer: Self-pay | Admitting: Obstetrics & Gynecology

## 2021-04-09 ENCOUNTER — Ambulatory Visit (INDEPENDENT_AMBULATORY_CARE_PROVIDER_SITE_OTHER): Payer: Medicaid Other | Admitting: Obstetrics & Gynecology

## 2021-04-09 VITALS — BP 125/63 | HR 75 | Ht <= 58 in | Wt 236.4 lb

## 2021-04-09 DIAGNOSIS — D649 Anemia, unspecified: Secondary | ICD-10-CM

## 2021-04-09 DIAGNOSIS — Z124 Encounter for screening for malignant neoplasm of cervix: Secondary | ICD-10-CM

## 2021-04-09 DIAGNOSIS — Z1231 Encounter for screening mammogram for malignant neoplasm of breast: Secondary | ICD-10-CM

## 2021-04-09 DIAGNOSIS — E1165 Type 2 diabetes mellitus with hyperglycemia: Secondary | ICD-10-CM

## 2021-04-09 DIAGNOSIS — N920 Excessive and frequent menstruation with regular cycle: Secondary | ICD-10-CM

## 2021-04-09 DIAGNOSIS — N6489 Other specified disorders of breast: Secondary | ICD-10-CM

## 2021-04-09 MED ORDER — NORETHINDRONE 0.35 MG PO TABS: 1.0000 | ORAL_TABLET | Freq: Every day | ORAL | 5 refills | Status: DC

## 2021-04-09 NOTE — Progress Notes (Signed)
GYNECOLOGY  VISIT  CC:   heavy bleeding  HPI: 40 y.o. G0P0000 Married White or Caucasian female here for new patient visit for long hx of heavy bleeding.  Cycles are 10-14 days.  Uses pads and on severe days changes 6-10 times a days.  Does pass clots at times.  Last ob/gyn she saw was Dr. Lahoma Crocker.  Has never been on anything for treatment.  Has felt her concerns were not heard.    She does have a h/o anemia but iron level was normal 07/2020.  Long hx of neck/back surgeries (35 per pt).  She cannot turn her next to the right or left much and cannot extend neck as well.  Is diabetes.  Last HbA1C was 9.2 03/22/2021.  Sees pain specialists, Dr. Malena Catholic at [place] on [address].    Last pap smear 06/2016.  Was normal/neg HR HPV.  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 ([place]) 02/19/2021   Anemia in chronic kidney disease 12/10/2020   Nephrotic range proteinuria 12/10/2020   Influenza vaccine refused 08/11/2020   Cervical myelopathy ([place]) 08/11/2020   Closed fracture of odontoid process of axis (HCC) 06/11/2019   Chronic migraine 11/19/2018   AKI (acute kidney injury) ([place]) 10/03/2018   Morbid obesity ([place]) 07/12/2018   Muscle spasm of back 04/24/2017   Osteoarthritis of both hips 01/15/2017   Recurrent headache 01/06/2017   Irregular menstrual cycle 05/30/2016   Chronic pain syndrome 09/21/2015   HYPERTRIGLYCERIDEMIA 05/02/2009   Insomnia 05/01/2009   SCOLIOSIS 05/18/2008   Diabetes type 2, uncontrolled ([place]) 04/18/2008   DEPRESSION 04/18/2008   Essential hypertension, benign 04/18/2008    Past Medical History:  Diagnosis Date   Arthritis    GERD (gastroesophageal reflux disease)    Headache    migraine 2 weeks ago   Hyperlipidemia    Hypertension associated with diabetes ([place])    Renal disorder    has both kidneys, compacted and 'squashed together"   Scoliosis     Sleep apnea    in the midst of being tested for it.   waiting on scheduling    Type 2 diabetes mellitus treated with insulin ([place])    dx 2000    Past Surgical History:  Procedure Laterality Date   BACK SURGERY     CERVICAL FUSION     frontal closed sutures to forehead     Harrington Rod placement     HIP SURGERY     staph infection   LUMBAR FUSION     RADIOLOGY WITH ANESTHESIA N/A 02/25/2017   Procedure: RADIOLOGY WITH ANESTHESIA/MRI THORACIC SPINE AND LEFT SHOULDER WITHOUT CONTRAST.;  Surgeon: Radiologist, Medication, MD;  Location: [place];  Service: Radiology;  Laterality: N/A;    MEDS:   Current Outpatient Medications on File Prior to Visit  Medication Sig Dispense Refill   ACCU-CHEK SOFTCLIX LANCETS lancets 1 each by Other route 3 (three) times daily. ICD 10 E11.9 100 each 12   Accu-Chek Softclix Lancets lancets Use as instructed 100 each 12   albuterol (PROVENTIL HFA;VENTOLIN HFA) 108 (90 Base) MCG/ACT inhaler Inhale 1 puff into the lungs every 6 (six) hours as needed for wheezing or shortness of breath. 8 g 3   amitriptyline (ELAVIL) 25 MG tablet TAKE 1 TABLET(25 MG) BY MOUTH AT BEDTIME 30 tablet 2   atorvastatin (LIPITOR) 10 MG tablet Take 1 tablet (10 mg total) by mouth daily. 90 tablet 3  Blood Glucose Monitoring Suppl (ACCU-CHEK AVIVA PLUS) w/Device KIT 1 Device by Does not apply route 3 (three) times daily after meals. ICD 10 E11.9 1 kit 0   Blood Glucose Monitoring Suppl (ACCU-CHEK GUIDE) w/Device KIT Use as directed 1 kit 0   carvedilol (COREG) 12.5 MG tablet Take 1 tablet (12.5 mg total) by mouth 2 (two) times daily with a meal. 180 tablet 3   escitalopram (LEXAPRO) 20 MG tablet TAKE 1 TABLET(20 MG) BY MOUTH DAILY 90 tablet 0   glucose blood (ACCU-CHEK AVIVA PLUS) test strip 1 each by Other route 3 (three) times daily. ICD 10 E11.9 100 each 12   glucose blood (ACCU-CHEK GUIDE) test strip Test 3x/day before meals 100 each 12   hydrochlorothiazide (HYDRODIURIL) 25 MG  tablet Take 1 tablet (25 mg total) by mouth daily. 90 tablet 2   Insulin Glargine (LANTUS SOLOSTAR) 100 UNIT/ML Solostar Pen Inject 38 Units into the skin daily. 15 mL 11   Insulin Pen Needle (B-D ULTRAFINE III SHORT PEN) 31G X 8 MM MISC 1 application by Does not apply route daily. 60 each 5   Lancet Devices (ACCU-CHEK SOFTCLIX) lancets 1 each by Other route 3 (three) times daily. ICD 10 E11.9 1 each 0   lisinopril (ZESTRIL) 40 MG tablet Take 1 tablet (40 mg total) by mouth daily. 90 tablet 2   methocarbamol (ROBAXIN) 750 MG tablet Take 1 tablet (750 mg total) by mouth 3 (three) times daily as needed (muscle spasm/pain). 20 tablet 0   NOVOLOG FLEXPEN 100 UNIT/ML FlexPen INJECT 15 UNITS INTO THE SKIN WITH THE TWO LARGEST MEALS OF THE DAY. 15 mL 2   triamcinolone cream (KENALOG) 0.1 % Apply 1 application topically 2 (two) times daily. 30 g 0   Vitamin D, Ergocalciferol, (DRISDOL) 1.25 MG (50000 UNIT) CAPS capsule Take 1 capsule (50,000 Units total) by mouth every 7 (seven) days. 12 capsule 1   busPIRone (BUSPAR) 10 MG tablet TAKE 1 TABLET(10 MG) BY MOUTH THREE TIMES DAILY (Patient not taking: Reported on 04/09/2021) 90 tablet 1   Erenumab-aooe (AIMOVIG) 70 MG/ML SOAJ Administer 1 mL under the skin every 30 days. Must be seen for further refills. (Patient not taking: Reported on 04/09/2021) 1 mL 0   gabapentin (NEURONTIN) 100 MG capsule TAKE 1 CAPSULE(100 MG) BY MOUTH THREE TIMES DAILY (Patient not taking: Reported on 04/09/2021) 90 capsule 1   gabapentin (NEURONTIN) 300 MG capsule Take 1 capsule (300 mg total) by mouth 3 (three) times daily. 3 times a day when necessary neuropathy pain (Patient not taking: Reported on 04/09/2021) 90 capsule 3   ondansetron (ZOFRAN ODT) 4 MG disintegrating tablet Take 1 tablet (4 mg total) by mouth every 8 (eight) hours as needed. (Patient not taking: Reported on 04/09/2021) 20 tablet 6   sitaGLIPtin (JANUVIA) 100 MG tablet TAKE 1 TABLET (100 MG TOTAL) BY MOUTH DAILY. (Patient  not taking: Reported on 04/09/2021) 30 tablet 4   SUMAtriptan (IMITREX) 100 MG tablet Take 1 tablet (100 mg total) by mouth once as needed for up to 1 dose for migraine. May repeat in 2 hours if headache persists or recurs. (Patient not taking: Reported on 04/09/2021) 12 tablet 11   tiZANidine (ZANAFLEX) 4 MG tablet TAKE 1 TABLET BY MOUTH EVERY 8 HOURS AS NEEDED (Patient not taking: Reported on 04/09/2021) 30 tablet 0   topiramate (TOPAMAX) 50 MG tablet Take 1 tablet (50 mg total) by mouth 2 (two) times daily. (Patient not taking: Reported on 04/09/2021) 60 tablet 11  No current facility-administered medications on file prior to visit.    ALLERGIES: Hydrocodone, Metformin and related, Norvasc [amlodipine besylate], and Ultrasound gel  Family History  Problem Relation Age of Onset   Cancer Maternal Grandmother        mandibular cancer    Diabetes Mother    Heart failure Mother    CAD Mother        triple CABG   Diabetes Maternal Aunt    Stroke Father     SH:  married, non smoker  Review of Systems  Constitutional: Negative.   Cardiovascular: Negative.   Genitourinary:  Positive for menstrual problem.   PHYSICAL EXAMINATION:    BP 125/63 (BP Location: Left Arm, Patient Position: Sitting, Cuff Size: Large)   Pulse 75   Ht 4' 8"  (1.422 m) Comment: replaced  Wt 236 lb 6.4 oz (107.2 kg)   LMP 03/23/2021 (Approximate)   BMI 53.00 kg/m     General appearance: alert, cooperative and appears stated age Neck: cannot rotate to right or left past about 30 degrees CV:  Regular rate and rhythm Lungs:  clear to auscultation, no wheezes, rales or rhonchi, symmetric air entry Breasts: normal appearance, no masses or tenderness, left is larger than right by a few cup sizes Abdomen: soft, non-tender; bowel sounds normal; no masses,  no organomegaly Lymph:  no inguinal LAD noted  Pelvic: External genitalia:  no lesions              Urethra:  normal appearing urethra with no masses, tenderness  or lesions              Bartholins and Skenes: normal                 Vagina: normal appearing vagina with normal color and discharge, no lesions              Cervix: no lesions              Bimanual Exam:  Uterus:  normal size, contour, position, consistency, mobility, non-tender              Adnexa: no mass, fullness, tenderness  Chaperone, Octaviano Batty, CMA, was present for exam.  Assessment/Plan: 1. Menorrhagia with regular cycle - recommend pt have endometrial biopsy and ultrasound for evaluation of cause - US PELVIS TRANSVAGINAL NON-OB (TV ONLY); Future  2. Cervical cancer screening - Cytology - PAP( [place])  3. Asymmetrical breasts - reassured pt this does happen and will not interfere with ability to have MMG  4. Encounter for screening mammogram for malignant neoplasm of breast - MM 3D SCREEN BREAST BILATERAL; Future  5. Type 2 diabetes mellitus with hyperglycemia, without long-term current use of insulin (HCC)  6. Normocytic anemia - [place]; Future - B12 and Folate Panel; Future

## 2021-04-13 LAB — CYTOLOGY - PAP
Adequacy: ABSENT
Comment: NEGATIVE
Diagnosis: UNDETERMINED — AB
High risk HPV: NEGATIVE

## 2021-04-17 ENCOUNTER — Encounter (HOSPITAL_BASED_OUTPATIENT_CLINIC_OR_DEPARTMENT_OTHER): Payer: Self-pay

## 2021-04-25 DIAGNOSIS — G8929 Other chronic pain: Secondary | ICD-10-CM | POA: Diagnosis not present

## 2021-04-25 DIAGNOSIS — G894 Chronic pain syndrome: Secondary | ICD-10-CM | POA: Diagnosis not present

## 2021-04-25 DIAGNOSIS — M542 Cervicalgia: Secondary | ICD-10-CM | POA: Diagnosis not present

## 2021-04-25 DIAGNOSIS — Z6841 Body Mass Index (BMI) 40.0 and over, adult: Secondary | ICD-10-CM | POA: Diagnosis not present

## 2021-04-25 DIAGNOSIS — M549 Dorsalgia, unspecified: Secondary | ICD-10-CM | POA: Diagnosis not present

## 2021-04-25 DIAGNOSIS — Z79899 Other long term (current) drug therapy: Secondary | ICD-10-CM | POA: Diagnosis not present

## 2021-04-25 DIAGNOSIS — E119 Type 2 diabetes mellitus without complications: Secondary | ICD-10-CM | POA: Diagnosis not present

## 2021-04-25 DIAGNOSIS — R030 Elevated blood-pressure reading, without diagnosis of hypertension: Secondary | ICD-10-CM | POA: Diagnosis not present

## 2021-05-02 ENCOUNTER — Other Ambulatory Visit (HOSPITAL_BASED_OUTPATIENT_CLINIC_OR_DEPARTMENT_OTHER): Payer: Medicaid Other

## 2021-05-09 ENCOUNTER — Other Ambulatory Visit (HOSPITAL_BASED_OUTPATIENT_CLINIC_OR_DEPARTMENT_OTHER): Payer: Medicaid Other

## 2021-05-09 ENCOUNTER — Other Ambulatory Visit (HOSPITAL_BASED_OUTPATIENT_CLINIC_OR_DEPARTMENT_OTHER): Payer: Medicaid Other | Admitting: Obstetrics & Gynecology

## 2021-05-09 ENCOUNTER — Ambulatory Visit (HOSPITAL_BASED_OUTPATIENT_CLINIC_OR_DEPARTMENT_OTHER): Payer: Medicaid Other | Admitting: Radiology

## 2021-05-16 ENCOUNTER — Other Ambulatory Visit (HOSPITAL_BASED_OUTPATIENT_CLINIC_OR_DEPARTMENT_OTHER): Payer: Medicaid Other

## 2021-05-23 ENCOUNTER — Other Ambulatory Visit (HOSPITAL_BASED_OUTPATIENT_CLINIC_OR_DEPARTMENT_OTHER): Payer: Medicaid Other

## 2021-05-23 DIAGNOSIS — M549 Dorsalgia, unspecified: Secondary | ICD-10-CM | POA: Diagnosis not present

## 2021-05-23 DIAGNOSIS — Z6841 Body Mass Index (BMI) 40.0 and over, adult: Secondary | ICD-10-CM | POA: Diagnosis not present

## 2021-05-23 DIAGNOSIS — E119 Type 2 diabetes mellitus without complications: Secondary | ICD-10-CM | POA: Diagnosis not present

## 2021-05-23 DIAGNOSIS — R030 Elevated blood-pressure reading, without diagnosis of hypertension: Secondary | ICD-10-CM | POA: Diagnosis not present

## 2021-05-23 DIAGNOSIS — G894 Chronic pain syndrome: Secondary | ICD-10-CM | POA: Diagnosis not present

## 2021-05-23 DIAGNOSIS — M542 Cervicalgia: Secondary | ICD-10-CM | POA: Diagnosis not present

## 2021-05-23 DIAGNOSIS — Z79899 Other long term (current) drug therapy: Secondary | ICD-10-CM | POA: Diagnosis not present

## 2021-05-29 ENCOUNTER — Other Ambulatory Visit: Payer: Self-pay | Admitting: Internal Medicine

## 2021-05-29 DIAGNOSIS — G4700 Insomnia, unspecified: Secondary | ICD-10-CM

## 2021-05-29 NOTE — Telephone Encounter (Signed)
Rx filled 05/28/21- request future fills- too soon Requested Prescriptions  Pending Prescriptions Disp Refills  . 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 ([place])   [place], MD   8 months ago Essential hypertension   [place], Annie Main L, RPH-CPP   9 months ago Type 2 diabetes mellitus with obesity [facility])   [place], [name], MD   1 year ago Type 2 diabetes mellitus without complication, with long-term current use of insulin [facility])   [place], [name], MD   1 year ago Type 2 diabetes mellitus without complication, with long-term current use of insulin [facility])   [place], MD      Future Appointments            In 3 weeks Wynetta Emery Dalbert Batman, MD [place]

## 2021-06-04 ENCOUNTER — Encounter (HOSPITAL_BASED_OUTPATIENT_CLINIC_OR_DEPARTMENT_OTHER): Payer: Self-pay

## 2021-06-25 ENCOUNTER — Ambulatory Visit: Payer: Medicaid Other | Admitting: Internal Medicine

## 2021-06-27 ENCOUNTER — Ambulatory Visit: Payer: Medicaid Other | Attending: Internal Medicine | Admitting: Internal Medicine

## 2021-06-27 ENCOUNTER — Other Ambulatory Visit: Payer: Self-pay

## 2021-06-27 ENCOUNTER — Encounter: Payer: Self-pay | Admitting: Internal Medicine

## 2021-06-27 VITALS — BP 151/85 | HR 71 | Resp 16 | Wt 239.0 lb

## 2021-06-27 DIAGNOSIS — R030 Elevated blood-pressure reading, without diagnosis of hypertension: Secondary | ICD-10-CM | POA: Diagnosis not present

## 2021-06-27 DIAGNOSIS — Z2821 Immunization not carried out because of patient refusal: Secondary | ICD-10-CM | POA: Diagnosis not present

## 2021-06-27 DIAGNOSIS — Z6379 Other stressful life events affecting family and household: Secondary | ICD-10-CM

## 2021-06-27 DIAGNOSIS — G8929 Other chronic pain: Secondary | ICD-10-CM | POA: Diagnosis not present

## 2021-06-27 DIAGNOSIS — I10 Essential (primary) hypertension: Secondary | ICD-10-CM

## 2021-06-27 DIAGNOSIS — M542 Cervicalgia: Secondary | ICD-10-CM | POA: Diagnosis not present

## 2021-06-27 DIAGNOSIS — G4700 Insomnia, unspecified: Secondary | ICD-10-CM | POA: Diagnosis not present

## 2021-06-27 DIAGNOSIS — E119 Type 2 diabetes mellitus without complications: Secondary | ICD-10-CM | POA: Diagnosis not present

## 2021-06-27 DIAGNOSIS — G629 Polyneuropathy, unspecified: Secondary | ICD-10-CM

## 2021-06-27 DIAGNOSIS — G894 Chronic pain syndrome: Secondary | ICD-10-CM | POA: Diagnosis not present

## 2021-06-27 DIAGNOSIS — Z79899 Other long term (current) drug therapy: Secondary | ICD-10-CM | POA: Diagnosis not present

## 2021-06-27 DIAGNOSIS — M549 Dorsalgia, unspecified: Secondary | ICD-10-CM | POA: Diagnosis not present

## 2021-06-27 DIAGNOSIS — F411 Generalized anxiety disorder: Secondary | ICD-10-CM | POA: Diagnosis not present

## 2021-06-27 DIAGNOSIS — E1121 Type 2 diabetes mellitus with diabetic nephropathy: Secondary | ICD-10-CM

## 2021-06-27 DIAGNOSIS — Z6841 Body Mass Index (BMI) 40.0 and over, adult: Secondary | ICD-10-CM | POA: Diagnosis not present

## 2021-06-27 DIAGNOSIS — M4125 Other idiopathic scoliosis, thoracolumbar region: Secondary | ICD-10-CM

## 2021-06-27 LAB — POCT GLYCOSYLATED HEMOGLOBIN (HGB A1C): HbA1c, POC (controlled diabetic range): 8.9 % — AB (ref 0.0–7.0)

## 2021-06-27 LAB — GLUCOSE, POCT (MANUAL RESULT ENTRY): POC Glucose: 207 mg/dl — AB (ref 70–99)

## 2021-06-27 MED ORDER — PREGABALIN 25 MG PO CAPS
25.0000 mg | ORAL_CAPSULE | Freq: Two times a day (BID) | ORAL | 1 refills | Status: DC
Start: 2021-06-27 — End: 2021-12-17
  Filled 2021-06-27 – 2021-07-05 (×2): qty 60, 30d supply, fill #0

## 2021-06-27 NOTE — Progress Notes (Signed)
Patient ID: Briana Williams, female    DOB: 02-May-1981  MRN: [id number]  CC: Diabetes and Hypertension   Subjective: Briana Williams is a 40 y.o. female who presents for chronic ds management.  Her husband is with her Her concerns today include:  Hx of scoliosis (multiple back surgeries), HTN, insomnia, DM2, depression,anxiety migraines. 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  Patient does not have her medications with her.  Menorrhagia: She saw the gynecologist since her last visit and had Pap smear done.  She is scheduled for pelvic ultrasound on the seventh of next month.  Looks like she was started on Micronor.  DM: Results for orders placed or performed in visit on 06/27/21  POCT glucose (manual entry)  Result Value Ref Range   POC Glucose 207 (A) 70 - 99 mg/dl  POCT glycosylated hemoglobin (Hb A1C)  Result Value Ref Range   Hemoglobin A1C     HbA1c POC (<> result, manual entry)     HbA1c, POC (prediabetic range)     HbA1c, POC (controlled diabetic range) 8.9 (A) 0.0 - 7.0 %  BS under 200 the last few times she checked it but she has not been checking it consistently.  BS 208 this a.m before BF.  Reports compliance with NovoLog 15 units twice a day with the 2 largest meals of the day, Lantus insulin 38 units though she was not able to tell me her doses.  When I told her what is on her med list in terms of doses she confirms that she is taking.  Eating habits have been sporadic. Legs hurt from knees down LT>RT and burn.  No longer on gabapentin was weaned and did not find it helpful Some tingling in LT foot and leg She is on oxycodone 10 mg 5 times a day through her pain specialist. Reports having had her eye exam about 1-1/2 months ago through [facility]. -She has microalbumin with greater than 5 g of protein in the urine.  I had referred her to nephrology several months back.  Patient states that she was called but could not go at that time.  She plans to call and reschedule. -Blood  pressure noted to be elevated.  She did not take her medicines as yet for today.   She reports being very stressed over her husband's health.  He was recently hospitalized for about a week.  He has not been doing well since being hospitalized last year in the ICU due to COVID pneumonia.   She is not sleeping well.  She feels anxious all the time.  She tells me that her pain doctor recommended that she speak with me about being placed on Xanax.  She is on Lexapro and Elavil.  She was finally able to get in with the physical therapist in [facility] [place] to complete evaluation for all mobility chair.  She is requesting mobility chair because of difficulty ambulating due to severe scoliosis of her spine.  She has a manual wheelchair at home.  She also has a Hoveround which she states she has had for a while but it gave out.  She tells me that she can barely make it from the exam room back to the parking lot to her car due to her back issues.   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 ([place]) 02/19/2021   Anemia in chronic kidney disease 12/10/2020  Nephrotic range proteinuria 12/10/2020   Influenza vaccine refused 08/11/2020   Cervical myelopathy ([place]) 08/11/2020   Closed fracture of odontoid process of axis ([place]) 06/11/2019   Chronic migraine 11/19/2018   AKI (acute kidney injury) ([place]) 10/03/2018   Morbid obesity ([place]) 07/12/2018   Muscle spasm of back 04/24/2017   Osteoarthritis of both hips 01/15/2017   Recurrent headache 01/06/2017   Irregular menstrual cycle 05/30/2016   Chronic pain syndrome 09/21/2015   HYPERTRIGLYCERIDEMIA 05/02/2009   Insomnia 05/01/2009   SCOLIOSIS 05/18/2008   Diabetes type 2, uncontrolled 04/18/2008   DEPRESSION 04/18/2008   Essential hypertension, benign 04/18/2008     Current Outpatient Medications on File Prior to Visit  Medication Sig Dispense Refill    ACCU-CHEK SOFTCLIX LANCETS lancets 1 each by Other route 3 (three) times daily. ICD 10 E11.9 100 each 12   Accu-Chek Softclix Lancets lancets Use as instructed 100 each 12   albuterol (PROVENTIL HFA;VENTOLIN HFA) 108 (90 Base) MCG/ACT inhaler Inhale 1 puff into the lungs every 6 (six) hours as needed for wheezing or shortness of breath. 8 g 3   amitriptyline (ELAVIL) 25 MG tablet TAKE 1 TABLET(25 MG) BY MOUTH AT BEDTIME 30 tablet 2   atorvastatin (LIPITOR) 10 MG tablet Take 1 tablet (10 mg total) by mouth daily. 90 tablet 3   Blood Glucose Monitoring Suppl (ACCU-CHEK AVIVA PLUS) w/Device KIT 1 Device by Does not apply route 3 (three) times daily after meals. ICD 10 E11.9 1 kit 0   Blood Glucose Monitoring Suppl (ACCU-CHEK GUIDE) w/Device KIT Use as directed 1 kit 0   busPIRone (BUSPAR) 10 MG tablet TAKE 1 TABLET(10 MG) BY MOUTH THREE TIMES DAILY (Patient not taking: Reported on 04/09/2021) 90 tablet 1   carvedilol (COREG) 12.5 MG tablet Take 1 tablet (12.5 mg total) by mouth 2 (two) times daily with a meal. 180 tablet 3   Erenumab-aooe (AIMOVIG) 70 MG/ML SOAJ Administer 1 mL under the skin every 30 days. Must be seen for further refills. (Patient not taking: Reported on 04/09/2021) 1 mL 0   escitalopram (LEXAPRO) 20 MG tablet TAKE 1 TABLET(20 MG) BY MOUTH DAILY 90 tablet 0   glucose blood (ACCU-CHEK AVIVA PLUS) test strip 1 each by Other route 3 (three) times daily. ICD 10 E11.9 100 each 12   glucose blood (ACCU-CHEK GUIDE) test strip Test 3x/day before meals 100 each 12   hydrochlorothiazide (HYDRODIURIL) 25 MG tablet Take 1 tablet (25 mg total) by mouth daily. 90 tablet 2   Insulin Glargine (LANTUS SOLOSTAR) 100 UNIT/ML Solostar Pen Inject 38 Units into the skin daily. 15 mL 11   Insulin Pen Needle (B-D ULTRAFINE III SHORT PEN) 31G X 8 MM MISC 1 application by Does not apply route daily. 60 each 5   Lancet Devices (ACCU-CHEK SOFTCLIX) lancets 1 each by Other route 3 (three) times daily. ICD 10 E11.9  1 each 0   lisinopril (ZESTRIL) 40 MG tablet Take 1 tablet (40 mg total) by mouth daily. 90 tablet 2   methocarbamol (ROBAXIN) 750 MG tablet Take 1 tablet (750 mg total) by mouth 3 (three) times daily as needed (muscle spasm/pain). 20 tablet 0   norethindrone (MICRONOR) 0.35 MG tablet Take 1 tablet (0.35 mg total) by mouth daily. 28 tablet 5   NOVOLOG FLEXPEN 100 UNIT/ML FlexPen INJECT 15 UNITS INTO THE SKIN WITH THE TWO LARGEST MEALS OF THE DAY. 15 mL 2   ondansetron (ZOFRAN ODT) 4 MG disintegrating tablet Take 1 tablet (4 mg total)  by mouth every 8 (eight) hours as needed. (Patient not taking: Reported on 04/09/2021) 20 tablet 6   sitaGLIPtin (JANUVIA) 100 MG tablet TAKE 1 TABLET (100 MG TOTAL) BY MOUTH DAILY. (Patient not taking: Reported on 04/09/2021) 30 tablet 4   SUMAtriptan (IMITREX) 100 MG tablet Take 1 tablet (100 mg total) by mouth once as needed for up to 1 dose for migraine. May repeat in 2 hours if headache persists or recurs. (Patient not taking: Reported on 04/09/2021) 12 tablet 11   tiZANidine (ZANAFLEX) 4 MG tablet TAKE 1 TABLET BY MOUTH EVERY 8 HOURS AS NEEDED (Patient not taking: Reported on 04/09/2021) 30 tablet 0   topiramate (TOPAMAX) 50 MG tablet Take 1 tablet (50 mg total) by mouth 2 (two) times daily. (Patient not taking: Reported on 04/09/2021) 60 tablet 11   triamcinolone cream (KENALOG) 0.1 % Apply 1 application topically 2 (two) times daily. 30 g 0   Vitamin D, Ergocalciferol, (DRISDOL) 1.25 MG (50000 UNIT) CAPS capsule Take 1 capsule (50,000 Units total) by mouth every 7 (seven) days. 12 capsule 1   No current facility-administered medications on file prior to visit.    Allergies  Allergen Reactions   Hydrocodone Itching    Note: benadryl does NOT help   Metformin And Related Other (See Comments)    Causes blisters on skin   Norvasc [Amlodipine Besylate] Swelling    Ankle swelling   Ultrasound Gel Rash and Other (See Comments)    Patient gets a red rash with use of  ultrasound gel/ burns skin    Social History   Socioeconomic History   Marital status: Married    Spouse name: Not on file   Number of children: 3   Years of education: Not on file   Highest education level: Not on file  Occupational History    Employer: UNEMPLOYED  Tobacco Use   Smoking status: Never   Smokeless tobacco: Never  Substance and Sexual Activity   Alcohol use: No   Drug use: No   Sexual activity: Yes    Birth control/protection: Other-see comments    Comment: unable to have children d/t scoliosis  Other Topics Concern   Not on file  Social History Narrative   Not on file   Social Determinants of Health   Financial Resource Strain: Not on file  Food Insecurity: Not on file  Transportation Needs: Not on file  Physical Activity: Not on file  Stress: Not on file  Social Connections: Not on file  Intimate Partner Violence: Not on file    Family History  Problem Relation Age of Onset   Cancer Maternal Grandmother        mandibular cancer    Diabetes Mother    Heart failure Mother    CAD Mother        triple CABG   Diabetes Maternal Aunt    Stroke Father     Past Surgical History:  Procedure Laterality Date   BACK SURGERY     CERVICAL FUSION     frontal closed sutures to forehead     Harrington Rod placement     HIP SURGERY     staph infection   LUMBAR FUSION     RADIOLOGY WITH ANESTHESIA N/A 02/25/2017   Procedure: RADIOLOGY WITH ANESTHESIA/MRI THORACIC SPINE AND LEFT SHOULDER WITHOUT CONTRAST.;  Surgeon: Radiologist, Medication, MD;  Location: [place];  Service: Radiology;  Laterality: N/A;    ROS: Review of Systems Negative except as stated above  PHYSICAL  EXAM: BP (!) 151/85   Pulse 71   Resp 16   Wt 239 lb (108.4 kg)   SpO2 96%   BMI 53.58 kg/m   Wt Readings from Last 3 Encounters:  06/27/21 239 lb (108.4 kg)  04/09/21 236 lb 6.4 oz (107.2 kg)  08/11/20 239 lb 12.8 oz (108.8 kg)    Physical Exam  General appearance - alert,  well appearing, and in no distress Mental status - normal mood, behavior, speech, dress, motor activity, and thought processes Chest - clear to auscultation, no wheezes, rales or rhonchi, symmetric air entry Heart - normal rate, regular rhythm, normal S1, S2, no murmurs, rubs, clicks or gallops Extremities -1+ lower extremity edema.  Depression screen [facility] 2/9 06/27/2021 04/09/2021 08/11/2020  Decreased Interest 1 0 1  Down, Depressed, Hopeless 1 0 1  PHQ - 2 Score 2 0 2  Altered sleeping 2 - 3  Tired, decreased energy 2 - 1  Change in appetite 2 - 1  Feeling bad or failure about yourself  1 - 1  Trouble concentrating 1 - 1  Moving slowly or fidgety/restless 0 - 0  Suicidal thoughts 0 - 0  PHQ-9 Score 10 - 9  Some recent data might be hidden   GAD 7 : Generalized Anxiety Score 06/27/2021 08/11/2020 10/21/2019 03/11/2019  Nervous, Anxious, on Edge 1 1 3  0  Control/stop worrying 2 1 0 1  Worry too much - different things 2 1 1 1   Trouble relaxing 2 3 3 2   Restless 2 1 3 1   Easily annoyed or irritable 0 1 0 0  Afraid - awful might happen 3 1 1 1   Total GAD 7 Score 12 9 11 6       CMP Latest Ref Rng & Units 08/11/2020 10/12/2018 10/04/2018  Glucose 65 - 99 mg/dL 138(H) - 140(H)  BUN 6 - 24 mg/dL 20 - 17  Creatinine 0.57 - 1.00 mg/dL 1.40(H) - 0.84  Sodium 134 - 144 mmol/L 137 - 138  Potassium 3.5 - 5.2 mmol/L 4.6 - 3.7  Chloride 96 - 106 mmol/L 102 - 105  CO2 20 - 29 mmol/L 23 - 25  Calcium 8.7 - 10.2 mg/dL 9.1 - 9.1  Total Protein 6.0 - 8.5 g/dL 7.0 7.6 7.3  Total Bilirubin 0.0 - 1.2 mg/dL 0.3 - 0.5  Alkaline Phos 44 - 121 IU/L 87 - 69  AST 0 - 40 IU/L 19 - 24  ALT 0 - 32 IU/L 26 - 20   Lipid Panel     Component Value Date/Time   CHOL 198 08/11/2020 1632   TRIG 112 08/11/2020 1632   HDL 62 08/11/2020 1632   CHOLHDL 3.2 08/11/2020 1632   CHOLHDL 5.3 (H) 09/21/2015 1740   VLDL 56 (H) 09/21/2015 1740   LDLCALC 116 (H) 08/11/2020 1632    CBC    Component Value Date/Time    WBC 7.7 08/11/2020 1632   WBC 5.6 10/04/2018 1207   RBC 3.78 08/11/2020 1632   RBC 3.38 (L) 10/04/2018 1207   HGB 10.4 (L) 08/11/2020 1632   HCT 30.4 (L) 08/11/2020 1632   PLT 230 08/11/2020 1632   MCV 80 08/11/2020 1632   MCH 27.5 08/11/2020 1632   MCH 27.8 10/04/2018 1207   MCHC 34.2 08/11/2020 1632   MCHC 32.4 10/04/2018 1207   RDW 14.2 08/11/2020 1632   LYMPHSABS 2.0 10/04/2018 1207   LYMPHSABS 2.5 09/15/2018 1702   MONOABS 0.5 10/04/2018 1207   EOSABS 0.2 10/04/2018 1207  EOSABS 0.2 09/15/2018 1702   BASOSABS 0.0 10/04/2018 1207   BASOSABS 0.0 09/15/2018 1702   Results for orders placed or performed in visit on 06/27/21  POCT glucose (manual entry)  Result Value Ref Range   POC Glucose 207 (A) 70 - 99 mg/dl  POCT glycosylated hemoglobin (Hb A1C)  Result Value Ref Range   Hemoglobin A1C     HbA1c POC (<> result, manual entry)     HbA1c, POC (prediabetic range)     HbA1c, POC (controlled diabetic range) 8.9 (A) 0.0 - 7.0 %    ASSESSMENT AND PLAN: 1. 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  I recommend increasing Lantus insulin from 38 units daily to 42 units daily.  Encourage healthy eating habits. -Strongly advised that she call Dr. Serita Grit office the nephrologist and reschedule an appointment - POCT glucose (manual entry) - POCT glycosylated hemoglobin (Hb W6F) - Basic Metabolic Panel  2. Essential hypertension Not at goal but she has not taken medicines as yet for today.  According to her med list her medications are lisinopril, hydrochlorothiazide and carvedilol.  Advised to check blood pressure at least twice a week with goal being 130/80 or lower.  3. Stressful life event affecting family 4. Generalized anxiety disorder -Patient has had stressful life events related to her husband's health and her own health over the past 2 years including surgery that she had on her neck.  She is on Lexapro and  Elavil per her report but I see no recent prescriptions dispensed for Lexapro.  I did tell her that I am reluctant to use a benzodiazepine given that she is on oxycodone.  The 2 in combination puts her at increased risk for overdose and can be habit-forming.  I recommend referral to behavioral health specialist.  Patient states she will think about it and get back to me.  5. Insomnia, unspecified type Related to 3 and 4 above.  6. Peripheral polyneuropathy Likely due to diabetes.  We will try her with low-dose of Lyrica. - pregabalin (LYRICA) 25 MG capsule; Take 1 capsule (25 mg total) by mouth 2 (two) times daily.  Dispense: 60 capsule; Refill: 1 - Vitamin B12  7. Other idiopathic scoliosis, thoracolumbar region We will get her in for an evaluation for mobility device given that she has seen the physical therapist already.  Advised that she needs to get form from the company where she hopes to get her device from and bring it on that appointment.  8. Influenza vaccination declined Recommended.  Patient declined.  9. Pneumococcal vaccination declined Recommended.  Patient declined.    Patient was given the opportunity to ask questions.  Patient verbalized understanding of the plan and was able to repeat key elements of the plan.   Orders Placed This Encounter  Procedures   Basic Metabolic Panel   Vitamin K81   POCT glucose (manual entry)   POCT glycosylated hemoglobin (Hb A1C)     Requested Prescriptions   Signed Prescriptions Disp Refills   pregabalin (LYRICA) 25 MG capsule 60 capsule 1    Sig: Take 1 capsule (25 mg total) by mouth 2 (two) times daily.    Return f/u in 2-3 wks for Mobility chair eval.  Karle Plumber, MD, Rosalita Chessman

## 2021-06-28 ENCOUNTER — Encounter: Payer: Self-pay | Admitting: Internal Medicine

## 2021-06-28 LAB — BASIC METABOLIC PANEL
BUN/Creatinine Ratio: 18 (ref 9–23)
BUN: 32 mg/dL — ABNORMAL HIGH (ref 6–24)
CO2: 21 mmol/L (ref 20–29)
Calcium: 8.6 mg/dL — ABNORMAL LOW (ref 8.7–10.2)
Chloride: 106 mmol/L (ref 96–106)
Creatinine, Ser: 1.81 mg/dL — ABNORMAL HIGH (ref 0.57–1.00)
Glucose: 248 mg/dL — ABNORMAL HIGH (ref 70–99)
Potassium: 4.6 mmol/L (ref 3.5–5.2)
Sodium: 141 mmol/L (ref 134–144)
eGFR: 36 mL/min/{1.73_m2} — ABNORMAL LOW (ref >59)

## 2021-06-28 LAB — VITAMIN B12: Vitamin B-12: 533 pg/mL (ref 232–1245)

## 2021-06-28 NOTE — Progress Notes (Signed)
Let patient know that her kidney function is not 100% and has declined since January of this year.  It is imperative that she gets in with the nephrologist.  Please remind her to call them back and schedule an appointment.  Vitamin B12 level is normal.

## 2021-06-29 ENCOUNTER — Telehealth: Payer: Self-pay

## 2021-06-29 NOTE — Telephone Encounter (Signed)
-----   Message from Carilyn Goodpasture, RN sent at 06/29/2021 11:42 AM EST -----  ----- Message ----- From: Ladell Pier, MD Sent: 06/28/2021   7:53 AM EST To: Jackelyn Knife, RMA  Let patient know that her kidney function is not 100% and has declined since January of this year.  It is imperative that she gets in with the nephrologist.  Please remind her to call them back and schedule an appointment.  Vitamin B12 level is normal.

## 2021-06-29 NOTE — Telephone Encounter (Signed)
Pt was called and is aware of results, DOB was confirmed.  ?

## 2021-07-04 ENCOUNTER — Encounter: Payer: Self-pay | Admitting: Internal Medicine

## 2021-07-04 ENCOUNTER — Other Ambulatory Visit: Payer: Self-pay

## 2021-07-04 ENCOUNTER — Ambulatory Visit (INDEPENDENT_AMBULATORY_CARE_PROVIDER_SITE_OTHER): Payer: Medicaid Other | Admitting: Obstetrics & Gynecology

## 2021-07-04 ENCOUNTER — Ambulatory Visit (INDEPENDENT_AMBULATORY_CARE_PROVIDER_SITE_OTHER): Payer: Medicaid Other

## 2021-07-04 ENCOUNTER — Encounter (HOSPITAL_BASED_OUTPATIENT_CLINIC_OR_DEPARTMENT_OTHER): Payer: Self-pay | Admitting: Obstetrics & Gynecology

## 2021-07-04 ENCOUNTER — Ambulatory Visit (HOSPITAL_BASED_OUTPATIENT_CLINIC_OR_DEPARTMENT_OTHER)
Admission: RE | Admit: 2021-07-04 | Discharge: 2021-07-04 | Disposition: A | Discharge status: Home / Self Care | Payer: Medicaid Other | Source: Ambulatory Visit | Attending: Obstetrics & Gynecology | Admitting: Obstetrics & Gynecology

## 2021-07-04 VITALS — BP 197/84 | HR 74 | Wt 248.2 lb

## 2021-07-04 DIAGNOSIS — N920 Excessive and frequent menstruation with regular cycle: Secondary | ICD-10-CM | POA: Diagnosis not present

## 2021-07-04 DIAGNOSIS — N189 Chronic kidney disease, unspecified: Secondary | ICD-10-CM

## 2021-07-04 DIAGNOSIS — Z1231 Encounter for screening mammogram for malignant neoplasm of breast: Secondary | ICD-10-CM | POA: Insufficient documentation

## 2021-07-04 DIAGNOSIS — D631 Anemia in chronic kidney disease: Secondary | ICD-10-CM

## 2021-07-04 DIAGNOSIS — I10 Essential (primary) hypertension: Secondary | ICD-10-CM

## 2021-07-04 DIAGNOSIS — E1165 Type 2 diabetes mellitus with hyperglycemia: Secondary | ICD-10-CM | POA: Diagnosis not present

## 2021-07-04 DIAGNOSIS — D649 Anemia, unspecified: Secondary | ICD-10-CM | POA: Diagnosis not present

## 2021-07-04 LAB — CBC
Hematocrit: 25.7 % — ABNORMAL LOW (ref 34.0–46.6)
Hemoglobin: 8.8 g/dL — ABNORMAL LOW (ref 11.1–15.9)
MCH: 28.1 pg (ref 26.6–33.0)
MCHC: 34.2 g/dL (ref 31.5–35.7)
MCV: 82 fL (ref 79–97)
Platelets: 177 10*3/uL (ref 150–450)
RBC: 3.13 x10E6/uL — ABNORMAL LOW (ref 3.77–5.28)
RDW: 13 % (ref 11.7–15.4)
WBC: 7 10*3/uL (ref 3.4–10.8)

## 2021-07-04 MED ORDER — LISINOPRIL 40 MG PO TABS
40.0000 mg | ORAL_TABLET | Freq: Every day | ORAL | 2 refills | Status: DC
  Filled 2021-07-04 – 2021-08-09 (×3): qty 90, 90d supply, fill #0

## 2021-07-05 ENCOUNTER — Other Ambulatory Visit: Payer: Self-pay | Admitting: Internal Medicine

## 2021-07-05 ENCOUNTER — Encounter (HOSPITAL_BASED_OUTPATIENT_CLINIC_OR_DEPARTMENT_OTHER): Payer: Self-pay | Admitting: Obstetrics & Gynecology

## 2021-07-05 ENCOUNTER — Other Ambulatory Visit (HOSPITAL_COMMUNITY)
Admission: RE | Admit: 2021-07-05 | Discharge: 2021-07-05 | Disposition: A | Discharge status: Home / Self Care | Payer: Medicaid Other | Source: Ambulatory Visit | Attending: Obstetrics & Gynecology | Admitting: Obstetrics & Gynecology

## 2021-07-05 ENCOUNTER — Other Ambulatory Visit: Payer: Self-pay

## 2021-07-05 DIAGNOSIS — N920 Excessive and frequent menstruation with regular cycle: Secondary | ICD-10-CM | POA: Insufficient documentation

## 2021-07-05 LAB — IRON,TIBC AND FERRITIN PANEL
Ferritin: 16 ng/mL (ref 15–150)
Iron Saturation: 10 % — ABNORMAL LOW (ref 15–55)
Iron: 34 ug/dL (ref 27–159)
Total Iron Binding Capacity: 348 ug/dL (ref 250–450)
UIBC: 314 ug/dL (ref 131–425)

## 2021-07-05 MED ORDER — FERROUS SULFATE 325 (65 FE) MG PO TABS
325.0000 mg | ORAL_TABLET | Freq: Every day | ORAL | 1 refills | Status: DC
  Filled 2021-07-05 (×2): qty 90, 90d supply, fill #0
  Filled 2021-08-09: qty 100, 100d supply, fill #0
  Filled 2021-08-09: qty 30, 30d supply, fill #0

## 2021-07-05 NOTE — Progress Notes (Signed)
GYNECOLOGY  VISIT  CC:   discuss ultrasound, possible biopsy  HPI: 40 y.o. G0P0000 Married White or Caucasian female here for discussion of ultrasound results as well as endometrial biopsy.  She has been on progesterone and does not think it has helped at all.  Her last cycle was in late November and she bleed very heavily with that cycle.  She needed to wear three pads at a time and does pass large clots.  Is using the micronor but not SA.  Husband had a heart attack last month.    Ultrasound reviewed with pt.  Uterus normal.  Ovaries not clearly seen.  No free fluid.  Given amount of bleeding she's experienced, I do think it is reasonable to proceed with biopsy today.  Consent obtained.    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 ([place]) 02/19/2021   Anemia in chronic kidney disease 12/10/2020   Nephrotic range proteinuria 12/10/2020   Influenza vaccine refused 08/11/2020   Cervical myelopathy ([place]) 08/11/2020   Closed fracture of odontoid process of axis (HCC) 06/11/2019   Chronic migraine 11/19/2018   AKI (acute kidney injury) ([place]) 10/03/2018   Morbid obesity ([place]) 07/12/2018   Muscle spasm of back 04/24/2017   Osteoarthritis of both hips 01/15/2017   Recurrent headache 01/06/2017   Irregular menstrual cycle 05/30/2016   Chronic pain syndrome 09/21/2015   HYPERTRIGLYCERIDEMIA 05/02/2009   Insomnia 05/01/2009   SCOLIOSIS 05/18/2008   Diabetes type 2, uncontrolled 04/18/2008   DEPRESSION 04/18/2008   Essential hypertension, benign 04/18/2008    Past Medical History:  Diagnosis Date   Arthritis    GERD (gastroesophageal reflux disease)    Headache    migraine 2 weeks ago   Hyperlipidemia    Hypertension associated with diabetes ([place])    Renal disorder    has both kidneys, compacted and 'squashed together"   Scoliosis    Sleep apnea    in the midst of being tested for it.   waiting  on scheduling    Type 2 diabetes mellitus treated with insulin ([place])    dx 2000    Past Surgical History:  Procedure Laterality Date   BACK SURGERY     CERVICAL FUSION     frontal closed sutures to forehead     Harrington Rod placement     HIP SURGERY     staph infection   LUMBAR FUSION     RADIOLOGY WITH ANESTHESIA N/A 02/25/2017   Procedure: RADIOLOGY WITH ANESTHESIA/MRI THORACIC SPINE AND LEFT SHOULDER WITHOUT CONTRAST.;  Surgeon: Radiologist, Medication, MD;  Location: [place];  Service: Radiology;  Laterality: N/A;    MEDS:   Current Outpatient Medications on File Prior to Visit  Medication Sig Dispense Refill   ACCU-CHEK SOFTCLIX LANCETS lancets 1 each by Other route 3 (three) times daily. ICD 10 E11.9 100 each 12   Accu-Chek Softclix Lancets lancets Use as instructed 100 each 12   albuterol (PROVENTIL HFA;VENTOLIN HFA) 108 (90 Base) MCG/ACT inhaler Inhale 1 puff into the lungs every 6 (six) hours as needed for wheezing or shortness of breath. 8 g 3   amitriptyline (ELAVIL) 25 MG tablet TAKE 1 TABLET(25 MG) BY MOUTH AT BEDTIME 30 tablet 2   atorvastatin (LIPITOR) 10 MG tablet Take 1 tablet (10 mg total) by mouth daily. 90 tablet 3   Blood Glucose Monitoring Suppl (ACCU-CHEK AVIVA PLUS) w/Device KIT 1 Device by Does not apply route  3 (three) times daily after meals. ICD 10 E11.9 1 kit 0   Blood Glucose Monitoring Suppl (ACCU-CHEK GUIDE) w/Device KIT Use as directed 1 kit 0   busPIRone (BUSPAR) 10 MG tablet TAKE 1 TABLET(10 MG) BY MOUTH THREE TIMES DAILY 90 tablet 1   carvedilol (COREG) 12.5 MG tablet Take 1 tablet (12.5 mg total) by mouth 2 (two) times daily with a meal. 180 tablet 3   Erenumab-aooe (AIMOVIG) 70 MG/ML SOAJ Administer 1 mL under the skin every 30 days. Must be seen for further refills. 1 mL 0   escitalopram (LEXAPRO) 20 MG tablet TAKE 1 TABLET(20 MG) BY MOUTH DAILY 90 tablet 0   glucose blood (ACCU-CHEK AVIVA PLUS) test strip 1 each by Other route 3 (three) times  daily. ICD 10 E11.9 100 each 12   glucose blood (ACCU-CHEK GUIDE) test strip Test 3x/day before meals 100 each 12   hydrochlorothiazide (HYDRODIURIL) 25 MG tablet Take 1 tablet (25 mg total) by mouth daily. 90 tablet 2   Insulin Glargine (LANTUS SOLOSTAR) 100 UNIT/ML Solostar Pen Inject 38 Units into the skin daily. 15 mL 11   Insulin Pen Needle (B-D ULTRAFINE III SHORT PEN) 31G X 8 MM MISC 1 application by Does not apply route daily. 60 each 5   Lancet Devices (ACCU-CHEK SOFTCLIX) lancets 1 each by Other route 3 (three) times daily. ICD 10 E11.9 1 each 0   methocarbamol (ROBAXIN) 750 MG tablet Take 1 tablet (750 mg total) by mouth 3 (three) times daily as needed (muscle spasm/pain). 20 tablet 0   norethindrone (MICRONOR) 0.35 MG tablet Take 1 tablet (0.35 mg total) by mouth daily. 28 tablet 5   NOVOLOG FLEXPEN 100 UNIT/ML FlexPen INJECT 15 UNITS INTO THE SKIN WITH THE TWO LARGEST MEALS OF THE DAY. 15 mL 2   ondansetron (ZOFRAN ODT) 4 MG disintegrating tablet Take 1 tablet (4 mg total) by mouth every 8 (eight) hours as needed. 20 tablet 6   Oxycodone HCl 10 MG TABS Take 10 mg by mouth 5 (five) times daily as needed.     pregabalin (LYRICA) 25 MG capsule Take 1 capsule (25 mg total) by mouth 2 (two) times daily. 60 capsule 1   sitaGLIPtin (JANUVIA) 100 MG tablet TAKE 1 TABLET (100 MG TOTAL) BY MOUTH DAILY. 30 tablet 4   SUMAtriptan (IMITREX) 100 MG tablet Take 1 tablet (100 mg total) by mouth once as needed for up to 1 dose for migraine. May repeat in 2 hours if headache persists or recurs. 12 tablet 11   tiZANidine (ZANAFLEX) 4 MG tablet TAKE 1 TABLET BY MOUTH EVERY 8 HOURS AS NEEDED 30 tablet 0   triamcinolone cream (KENALOG) 0.1 % Apply 1 application topically 2 (two) times daily. 30 g 0   Vitamin D, Ergocalciferol, (DRISDOL) 1.25 MG (50000 UNIT) CAPS capsule Take 1 capsule (50,000 Units total) by mouth every 7 (seven) days. 12 capsule 1   No current facility-administered medications on file  prior to visit.    ALLERGIES: Hydrocodone, Metformin and related, Norvasc [amlodipine besylate], and Ultrasound gel  Family History  Problem Relation Age of Onset   Cancer Maternal Grandmother        mandibular cancer    Diabetes Mother    Heart failure Mother    CAD Mother        triple CABG   Diabetes Maternal Aunt    Stroke Father     SH:  married, non smoker  Review of Systems  Genitourinary:  Bleeding issues   PHYSICAL EXAMINATION:    BP (!) 197/84 (BP Location: Left Arm, Patient Position: Sitting, Cuff Size: Large)   Pulse 74   Wt 248 lb 3.2 oz (112.6 kg)   BMI 55.65 kg/m     General appearance: alert, cooperative and appears stated age Abdomen: soft, non-tender; bowel sounds normal; no masses,  no organomegaly Lymph:  no inguinal LAD noted  Pelvic: External genitalia:  no lesions              Urethra:  normal appearing urethra with no masses, tenderness or lesions              Bartholins and Skenes: normal                 Vagina: normal appearing vagina with normal color and discharge, no lesions              Cervix: no lesions   Procedure:  Speculum placed.  Cervix visualized and cleansed with betadine prep.  A single toothed tenaculum was applied to the anterior lip of the cervix.  Endometrial pipelle was advanced through the cervix into the endometrial cavity without difficulty.  Pipelle passed to 7.5cm.  Suction applied and pipelle removed with good tissue sample obtained.  Tenculum removed.  No bleeding noted.  Patient tolerated procedure well.  Chaperone, Octaviano Batty, CMA, was present for exam.  Assessment/Plan: 1. Menorrhagia with regular cycle - pathology pending - iron panel - CBC  2.  Uncontrolled diabetes - will need to get better control of this prior to scheduling surgery.

## 2021-07-06 ENCOUNTER — Other Ambulatory Visit (HOSPITAL_BASED_OUTPATIENT_CLINIC_OR_DEPARTMENT_OTHER): Payer: Self-pay | Admitting: Obstetrics & Gynecology

## 2021-07-06 DIAGNOSIS — D649 Anemia, unspecified: Secondary | ICD-10-CM

## 2021-07-06 LAB — SURGICAL PATHOLOGY

## 2021-07-06 NOTE — Telephone Encounter (Signed)
DOB verified. Informed pt that the mammogram follow up is being done because of a thickening along the skin that was seen.  Since this is her first mammogram, this is getting a baseline for her.  They did not see a mass or calcifications and those are findings that are typically more worrisome. Advised that she does not have iron deficiency anemia and that Dr. Sabra Heck would like her to see a hematologist to help get her hemoglobin better. Advised to continue taking progesterone to try and help her bleeding.  Advised that a new prescription for aygestin 5mg  to the pharmacy for her. Informed pt that her Hba1C was 9.2  This needs to be closer to 7 before any surgery can be done.  Advised that Dr. Sabra Heck can place a progesterone IUD in the office that should also help with bleeding for the short term. Pt will think about this and let us know if she wants to proceed. Pt states that she recently had an A1C done that was 8.2. Advised that she is headed in the right direction and to continue working with her PCP to get it closer to 7. Pt verbalized understanding.

## 2021-07-09 ENCOUNTER — Other Ambulatory Visit: Payer: Self-pay | Admitting: Obstetrics & Gynecology

## 2021-07-09 DIAGNOSIS — R928 Other abnormal and inconclusive findings on diagnostic imaging of breast: Secondary | ICD-10-CM

## 2021-07-11 ENCOUNTER — Other Ambulatory Visit: Payer: Self-pay

## 2021-07-12 ENCOUNTER — Other Ambulatory Visit: Payer: Self-pay

## 2021-07-12 ENCOUNTER — Ambulatory Visit: Payer: Medicaid Other | Attending: Internal Medicine | Admitting: Internal Medicine

## 2021-07-12 VITALS — BP 183/85 | HR 69 | Ht <= 58 in | Wt 235.6 lb

## 2021-07-12 DIAGNOSIS — Z9181 History of falling: Secondary | ICD-10-CM | POA: Diagnosis not present

## 2021-07-12 DIAGNOSIS — Z981 Arthrodesis status: Secondary | ICD-10-CM

## 2021-07-12 DIAGNOSIS — E1121 Type 2 diabetes mellitus with diabetic nephropathy: Secondary | ICD-10-CM | POA: Diagnosis not present

## 2021-07-12 DIAGNOSIS — G8928 Other chronic postprocedural pain: Secondary | ICD-10-CM | POA: Diagnosis not present

## 2021-07-12 DIAGNOSIS — I10 Essential (primary) hypertension: Secondary | ICD-10-CM

## 2021-07-12 DIAGNOSIS — M419 Scoliosis, unspecified: Secondary | ICD-10-CM | POA: Diagnosis not present

## 2021-07-12 DIAGNOSIS — M166 Other bilateral secondary osteoarthritis of hip: Secondary | ICD-10-CM | POA: Diagnosis not present

## 2021-07-12 LAB — GLUCOSE, POCT (MANUAL RESULT ENTRY): POC Glucose: 224 mg/dl — AB (ref 70–99)

## 2021-07-12 NOTE — Progress Notes (Signed)
Patient ID: Briana Williams, female    DOB: 10/14/1980  MRN: [id number]  CC: mobility evaluation for power wheelchair  Subjective: Briana Williams is a 40 y.o. 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   Patient presents today for mobility evaluation to get new power wheelchair.  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  Last surgery on the neck through the upper thoracic spine was done at [facility] 11/2019 due to worsening cervical myelopathy.  She also has chronic pain in her hips due to osteoarthritis.  Due to these conditions, she is unable to stand for more than a few minutes or walk any considerable distance.  She has had a power wheelchair for the past 9 years but it quit working 2 years ago.  PW is needed to allow her to perform some of her ADLs more efficiently, safely and with less pain.  She is on Oxycodone 5x/day due to chronic pain in her neck, back and hips.  She has history of falls.  She had fallen 09/2018 at home; CT of cervical spine showed dens fracture.  This resulted in the worsening cervical myelopathy that ultimately led her to have repeat surgery on her neck 11/2019.  She reports last fall was 02/2021 when her legs gave out on her. Patient has difficulty doing her laundry, cooking and washing dishes.  She has to sit in a chair and doing so for extended periods puts a lot of pressure on  her lower back contributing to pain.  She is not able to do grocery shopping without using one of the mobility carts in the store.  She has lower extremity edema and would benefit from keeping legs elevated when seated.  Pt reports manual wheelchair will not work for her due to weakness in her hands and in arms related to previous neck surgeries.  Also arthritis in her hip makes it difficult for her to use her legs to help with propelling herself in a wheelchair.  Thinks she will fall with a cane because she does not have sufficient trunk stability due to severe curvature of her spine.  A mobility scooter is not feasible due to weakness of her grip and trunk instability due to significant curvature of her spine which causes her trunk to lean more to one side.  Patient has been seen by physical therapy/Occupational Therapy through [facility] in September of this year.  She is deemed a good candidate for continued use of a power wheelchair.  She has ramps outside of her house.  Her house can accommodate the equipment.    BP noted to be elevated today.  She has taken her meds already for today.  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 02/19/2021   Anemia in chronic kidney disease 12/10/2020   Nephrotic range proteinuria 12/10/2020   Influenza vaccine refused 08/11/2020   Cervical myelopathy ([place]) 08/11/2020   Closed fracture of odontoid process of  axis ([place]) 06/11/2019   Chronic migraine 11/19/2018   AKI (acute kidney injury) ([place]) 10/03/2018   Morbid obesity ([place]) 07/12/2018   Muscle spasm of back 04/24/2017   Osteoarthritis of both hips 01/15/2017   Recurrent headache 01/06/2017   Irregular menstrual cycle 05/30/2016   Chronic pain syndrome 09/21/2015   HYPERTRIGLYCERIDEMIA 05/02/2009   Insomnia 05/01/2009   SCOLIOSIS 05/18/2008   Diabetes type 2, uncontrolled 04/18/2008   DEPRESSION  04/18/2008   Essential hypertension, benign 04/18/2008     Current Outpatient Medications on File Prior to Visit  Medication Sig Dispense Refill   ACCU-CHEK SOFTCLIX LANCETS lancets 1 each by Other route 3 (three) times daily. ICD 10 E11.9 100 each 12   Accu-Chek Softclix Lancets lancets Use as instructed 100 each 12   albuterol (PROVENTIL HFA;VENTOLIN HFA) 108 (90 Base) MCG/ACT inhaler Inhale 1 puff into the lungs every 6 (six) hours as needed for wheezing or shortness of breath. 8 g 3   amitriptyline (ELAVIL) 25 MG tablet TAKE 1 TABLET(25 MG) BY MOUTH AT BEDTIME 30 tablet 2   atorvastatin (LIPITOR) 10 MG tablet Take 1 tablet (10 mg total) by mouth daily. 90 tablet 3   Blood Glucose Monitoring Suppl (ACCU-CHEK AVIVA PLUS) w/Device KIT 1 Device by Does not apply route 3 (three) times daily after meals. ICD 10 E11.9 1 kit 0   Blood Glucose Monitoring Suppl (ACCU-CHEK GUIDE) w/Device KIT Use as directed 1 kit 0   busPIRone (BUSPAR) 10 MG tablet TAKE 1 TABLET(10 MG) BY MOUTH THREE TIMES DAILY 90 tablet 1   carvedilol (COREG) 12.5 MG tablet Take 1 tablet (12.5 mg total) by mouth 2 (two) times daily with a meal. 180 tablet 3   Erenumab-aooe (AIMOVIG) 70 MG/ML SOAJ Administer 1 mL under the skin every 30 days. Must be seen for further refills. 1 mL 0   escitalopram (LEXAPRO) 20 MG tablet TAKE 1 TABLET(20 MG) BY MOUTH DAILY 90 tablet 0   ferrous sulfate 325 (65 FE) MG tablet Take 1 tablet (325 mg total) by mouth daily with breakfast. 100 tablet 1   glucose blood (ACCU-CHEK AVIVA PLUS) test strip 1 each by Other route 3 (three) times daily. ICD 10 E11.9 100 each 12   glucose blood (ACCU-CHEK GUIDE) test strip Test 3x/day before meals 100 each 12   hydrochlorothiazide (HYDRODIURIL) 25 MG tablet Take 1 tablet (25 mg total) by mouth daily. 90 tablet 2   Insulin Glargine (LANTUS SOLOSTAR) 100 UNIT/ML Solostar Pen Inject 38 Units into the skin daily. 15 mL 11   Insulin Pen Needle (B-D ULTRAFINE III SHORT  PEN) 31G X 8 MM MISC 1 application by Does not apply route daily. 60 each 5   Lancet Devices (ACCU-CHEK SOFTCLIX) lancets 1 each by Other route 3 (three) times daily. ICD 10 E11.9 1 each 0   lisinopril (ZESTRIL) 40 MG tablet Take 1 tablet (40 mg total) by mouth daily. 90 tablet 2   methocarbamol (ROBAXIN) 750 MG tablet Take 1 tablet (750 mg total) by mouth 3 (three) times daily as needed (muscle spasm/pain). 20 tablet 0   norethindrone (MICRONOR) 0.35 MG tablet Take 1 tablet (0.35 mg total) by mouth daily. 28 tablet 5   NOVOLOG FLEXPEN 100 UNIT/ML FlexPen INJECT 15 UNITS INTO THE SKIN WITH THE TWO LARGEST MEALS OF THE DAY. 15 mL 2   ondansetron (ZOFRAN ODT) 4 MG disintegrating tablet Take 1 tablet (4 mg total) by mouth every 8 (eight) hours as needed. 20 tablet 6  Oxycodone HCl 10 MG TABS Take 10 mg by mouth 5 (five) times daily as needed.     pregabalin (LYRICA) 25 MG capsule Take 1 capsule (25 mg total) by mouth 2 (two) times daily. 60 capsule 1   sitaGLIPtin (JANUVIA) 100 MG tablet TAKE 1 TABLET (100 MG TOTAL) BY MOUTH DAILY. 30 tablet 4   SUMAtriptan (IMITREX) 100 MG tablet Take 1 tablet (100 mg total) by mouth once as needed for up to 1 dose for migraine. May repeat in 2 hours if headache persists or recurs. 12 tablet 11   tiZANidine (ZANAFLEX) 4 MG tablet TAKE 1 TABLET BY MOUTH EVERY 8 HOURS AS NEEDED 30 tablet 0   triamcinolone cream (KENALOG) 0.1 % Apply 1 application topically 2 (two) times daily. 30 g 0   Vitamin D, Ergocalciferol, (DRISDOL) 1.25 MG (50000 UNIT) CAPS capsule Take 1 capsule (50,000 Units total) by mouth every 7 (seven) days. 12 capsule 1   No current facility-administered medications on file prior to visit.    Allergies  Allergen Reactions   Hydrocodone Itching    Note: benadryl does NOT help   Metformin And Related Other (See Comments)    Causes blisters on skin   Norvasc [Amlodipine Besylate] Swelling    Ankle swelling   Ultrasound Gel Rash and Other (See  Comments)    Patient gets a red rash with use of ultrasound gel/ burns skin    Social History   Socioeconomic History   Marital status: Married    Spouse name: Not on file   Number of children: 3   Years of education: Not on file   Highest education level: Not on file  Occupational History    Employer: UNEMPLOYED  Tobacco Use   Smoking status: Never   Smokeless tobacco: Never  Substance and Sexual Activity   Alcohol use: No   Drug use: No   Sexual activity: Yes    Birth control/protection: Other-see comments    Comment: unable to have children d/t scoliosis  Other Topics Concern   Not on file  Social History Narrative   Not on file   Social Determinants of Health   Financial Resource Strain: Not on file  Food Insecurity: Not on file  Transportation Needs: Not on file  Physical Activity: Not on file  Stress: Not on file  Social Connections: Not on file  Intimate Partner Violence: Not on file    Family History  Problem Relation Age of Onset   Cancer Maternal Grandmother        mandibular cancer    Diabetes Mother    Heart failure Mother    CAD Mother        triple CABG   Diabetes Maternal Aunt    Stroke Father     Past Surgical History:  Procedure Laterality Date   BACK SURGERY     CERVICAL FUSION     frontal closed sutures to forehead     Harrington Rod placement     HIP SURGERY     staph infection   LUMBAR FUSION     RADIOLOGY WITH ANESTHESIA N/A 02/25/2017   Procedure: RADIOLOGY WITH ANESTHESIA/MRI THORACIC SPINE AND LEFT SHOULDER WITHOUT CONTRAST.;  Surgeon: Radiologist, Medication, MD;  Location: [place];  Service: Radiology;  Laterality: N/A;    ROS: Review of Systems Negative except as stated above  PHYSICAL EXAM: BP (!) 183/85 (BP Location: Left Arm, Cuff Size: Large)    Pulse 69    Ht _0  (1.422  m)    Wt 235 lb 9.6 oz (106.9 kg)    SpO2 99%    BMI 52.82 kg/m   Physical Exam  General appearance - alert, well appearing, morbidly obese  middle-age Caucasian female and in no distress Mental status -patient tearful at times when talking about her physical condition and limitations Chest - clear to auscultation, no wheezes, rales or rhonchi, symmetric air entry Heart - normal rate, regular rhythm, normal S1, S2, no murmurs, rubs, clicks or gallops Musculoskeletal -grip: Power 3/5 bilaterally. Power upper extremities: 3/5 proximally and distally bilaterally. Gross sensation decreased in the left upper extremity. Range of motion left upper extremity limited to less than 90 degrees due to pain and weakness. Lower extremities: 4/5 bilaterally with limited range of motion at both hips due to pain in the hips and lower back pain. Neck: Fixated with no range of motion from side to side or flexion extension due to previous surgeries. Patient with significant curvature of the spine in the neck and most of the thoracic spine. Gait: Patient's trunk is bent/leaning to the left with a hunched over posture. Extremities -1+ bilateral lower extremity edema.   CMP Latest Ref Rng & Units 06/27/2021 08/11/2020 10/12/2018  Glucose 70 - 99 mg/dL 248(H) 138(H) -  BUN 6 - 24 mg/dL 32(H) 20 -  Creatinine 0.57 - 1.00 mg/dL 1.81(H) 1.40(H) -  Sodium 134 - 144 mmol/L 141 137 -  Potassium 3.5 - 5.2 mmol/L 4.6 4.6 -  Chloride 96 - 106 mmol/L 106 102 -  CO2 20 - 29 mmol/L 21 23 -  Calcium 8.7 - 10.2 mg/dL 8.6(L) 9.1 -  Total Protein 6.0 - 8.5 g/dL - 7.0 7.6  Total Bilirubin 0.0 - 1.2 mg/dL - 0.3 -  Alkaline Phos 44 - 121 IU/L - 87 -  AST 0 - 40 IU/L - 19 -  ALT 0 - 32 IU/L - 26 -   Lipid Panel     Component Value Date/Time   CHOL 198 08/11/2020 1632   TRIG 112 08/11/2020 1632   HDL 62 08/11/2020 1632   CHOLHDL 3.2 08/11/2020 1632   CHOLHDL 5.3 (H) 09/21/2015 1740   VLDL 56 (H) 09/21/2015 1740   LDLCALC 116 (H) 08/11/2020 1632    CBC    Component Value Date/Time   WBC 7.0 07/04/2021 1456   WBC 5.6 10/04/2018 1207   RBC 3.13 (L)  07/04/2021 1456   RBC 3.38 (L) 10/04/2018 1207   HGB 8.8 (L) 07/04/2021 1456   HCT 25.7 (L) 07/04/2021 1456   PLT 177 07/04/2021 1456   MCV 82 07/04/2021 1456   MCH 28.1 07/04/2021 1456   MCH 27.8 10/04/2018 1207   MCHC 34.2 07/04/2021 1456   MCHC 32.4 10/04/2018 1207   RDW 13.0 07/04/2021 1456   LYMPHSABS 2.0 10/04/2018 1207   LYMPHSABS 2.5 09/15/2018 1702   MONOABS 0.5 10/04/2018 1207   EOSABS 0.2 10/04/2018 1207   EOSABS 0.2 09/15/2018 1702   BASOSABS 0.0 10/04/2018 1207   BASOSABS 0.0 09/15/2018 1702   Results for orders placed or performed in visit on 07/12/21  POCT glucose (manual entry)  Result Value Ref Range   POC Glucose 224 (A) 70 - 99 mg/dl    ASSESSMENT AND PLAN: 1. Kyphoscoliosis deformity of spine 2. Other secondary osteoarthritis of both hips 3. Other chronic postprocedural pain -from previous multiple spine surgeries 4. History of spinal fusion 5. Personal history of fall Power mobility is recommended to allow this pt to safely  negotiate the home and community environment. The patient is at risk for falls and is unable to propel a manual wheelchair. This device will provide positioning components to enable her  to safely perform activities of daily living.  For these reasons I am recommending a power mobility device for safety and functionality in and around the home. Manual wheelchair will not suffice because of weakness of grip and strength in her upper arms and chronic pain related to previous neck surgeries.  Also arthritis and pain in her hips and lower back makes it difficult for her to use her legs to help with propelling herself in a wheelchair.  Using a cane will not be beneficial due to significant curvature of her spine, she does not have sufficient trunk/gait stability.  A mobility scooter is not feasible due to weakness of her grip and trunk instability due to significant curvature of her spine which causes her trunk to lean more to one side.  I recommend  that her PWC have ability to elevate legs to help control edema in lower legs -copy of note from physical therapist will be sent with prescription for PWC.  6.  Essential Hypertension -not at goal.  Add Low dose Hydralazine  7. Type 2 diabetes mellitus with macroalbuminuric diabetic nephropathy (HCC) Not discussed on visit today. - POCT glucose (manual entry)   Manual wheelchair will not suffice because of weakness weakness of grip and strength in her upper arms related to previous neck surgeries.  Also arthritis in her hip makes it difficult for her to use her legs to help with propelling herself in a wheelchair.  Using a cane will not be beneficial due to significant curvature of her spine, she does not have sufficient gait stability.  A mobility scooter is not feasible due to weakness of her grip and trunk instability due to significant curvature of her spine which causes her trunk to lean more to 1 side.  Patient was given the opportunity to ask questions.  Patient verbalized understanding of the plan and was able to repeat key elements of the plan.   Orders Placed This Encounter  Procedures   POCT glucose (manual entry)     Requested Prescriptions    No prescriptions requested or ordered in this encounter    Return in about 3 months (around 10/10/2021) for Appt with [facility] in 3 weeks for bp check .  Karle Plumber, MD, FACP

## 2021-07-14 MED ORDER — HYDRALAZINE HCL 10 MG PO TABS: 10.0000 mg | ORAL_TABLET | Freq: Two times a day (BID) | ORAL | 4 refills | Status: DC

## 2021-07-17 ENCOUNTER — Encounter (HOSPITAL_BASED_OUTPATIENT_CLINIC_OR_DEPARTMENT_OTHER): Payer: Self-pay | Admitting: Obstetrics & Gynecology

## 2021-07-18 ENCOUNTER — Telehealth: Payer: Self-pay | Admitting: *Deleted

## 2021-07-18 NOTE — Progress Notes (Signed)
[facility] NOTE  Patient Care Team: Ladell Pier, MD as PCP - General (Internal Medicine) Jessy Oto, MD as Consulting Physician (Orthopedic Surgery)  CHIEF COMPLAINTS/PURPOSE OF CONSULTATION:  Acute on chronic anemia  INTERVAL HISTORY: Briana Williams is a 40 y.o. who was noted to have significant anemia and was referred to [name] for further evaluation and treatment. She has had longstanding chronic anemia with hemoglobin is between 9.5 and 10.5.  Most recently because of heavy uterine bleeding her hemoglobin has come down to 8.8.  She was referred to [name] for evaluation and treatment of her anemia.  The plan is to perform a hysterectomy but she needs clearance from anesthesiology as well as improve her hemoglobin so that she can tolerate such a surgery.  She is chronically wheelchair-bound and is brought in here today by her husband.  I reviewed her records extensively and collaborated the history with the patient.     MEDICAL HISTORY:  Past Medical History:  Diagnosis Date   Arthritis    GERD (gastroesophageal reflux disease)    Headache    migraine 2 weeks ago   Hyperlipidemia    Hypertension associated with diabetes ([place])    Renal disorder    has both kidneys, compacted and 'squashed together"   Scoliosis    Sleep apnea    in the midst of being tested for it.   waiting on scheduling    Type 2 diabetes mellitus treated with insulin ([place])    dx 2000    SURGICAL HISTORY: Past Surgical History:  Procedure Laterality Date   BACK SURGERY     CERVICAL FUSION     frontal closed sutures to forehead     Harrington Rod placement     HIP SURGERY     staph infection   LUMBAR FUSION     RADIOLOGY WITH ANESTHESIA N/A 02/25/2017   Procedure: RADIOLOGY WITH ANESTHESIA/MRI THORACIC SPINE AND LEFT SHOULDER WITHOUT CONTRAST.;  Surgeon: Radiologist, Medication, MD;  Location: [facility];  Service: Radiology;  Laterality: N/A;    SOCIAL HISTORY: Social  History   Socioeconomic History   Marital status: Married    Spouse name: Not on file   Number of children: 3   Years of education: Not on file   Highest education level: Not on file  Occupational History    Employer: UNEMPLOYED  Tobacco Use   Smoking status: Never   Smokeless tobacco: Never  Substance and Sexual Activity   Alcohol use: No   Drug use: No   Sexual activity: Yes    Birth control/protection: Other-see comments    Comment: unable to have children d/t scoliosis  Other Topics Concern   Not on file  Social History Narrative   Not on file   Social Determinants of Health   Financial Resource Strain: Not on file  Food Insecurity: Not on file  Transportation Needs: Not on file  Physical Activity: Not on file  Stress: Not on file  Social Connections: Not on file  Intimate Partner Violence: Not on file    FAMILY HISTORY: Family History  Problem Relation Age of Onset   Cancer Maternal Grandmother        mandibular cancer    Diabetes Mother    Heart failure Mother    CAD Mother        triple CABG   Diabetes Maternal Aunt    Stroke Father     ALLERGIES:  is allergic to hydrocodone, metformin and  related, norvasc [amlodipine besylate], and ultrasound gel.  MEDICATIONS:  Current Outpatient Medications  Medication Sig Dispense Refill   ACCU-CHEK SOFTCLIX LANCETS lancets 1 each by Other route 3 (three) times daily. ICD 10 E11.9 100 each 12   Accu-Chek Softclix Lancets lancets Use as instructed 100 each 12   albuterol (PROVENTIL HFA;VENTOLIN HFA) 108 (90 Base) MCG/ACT inhaler Inhale 1 puff into the lungs every 6 (six) hours as needed for wheezing or shortness of breath. 8 g 3   amitriptyline (ELAVIL) 25 MG tablet TAKE 1 TABLET(25 MG) BY MOUTH AT BEDTIME 30 tablet 2   atorvastatin (LIPITOR) 10 MG tablet Take 1 tablet (10 mg total) by mouth daily. 90 tablet 3   Blood Glucose Monitoring Suppl (ACCU-CHEK AVIVA PLUS) w/Device KIT 1 Device by Does not apply route 3  (three) times daily after meals. ICD 10 E11.9 1 kit 0   Blood Glucose Monitoring Suppl (ACCU-CHEK GUIDE) w/Device KIT Use as directed 1 kit 0   busPIRone (BUSPAR) 10 MG tablet TAKE 1 TABLET(10 MG) BY MOUTH THREE TIMES DAILY 90 tablet 1   carvedilol (COREG) 12.5 MG tablet Take 1 tablet (12.5 mg total) by mouth 2 (two) times daily with a meal. 180 tablet 3   Erenumab-aooe (AIMOVIG) 70 MG/ML SOAJ Administer 1 mL under the skin every 30 days. Must be seen for further refills. 1 mL 0   escitalopram (LEXAPRO) 20 MG tablet TAKE 1 TABLET(20 MG) BY MOUTH DAILY 90 tablet 0   ferrous sulfate 325 (65 FE) MG tablet Take 1 tablet (325 mg total) by mouth daily with breakfast. 100 tablet 1   glucose blood (ACCU-CHEK AVIVA PLUS) test strip 1 each by Other route 3 (three) times daily. ICD 10 E11.9 100 each 12   glucose blood (ACCU-CHEK GUIDE) test strip Test 3x/day before meals 100 each 12   hydrALAZINE (APRESOLINE) 10 MG tablet Take 1 tablet (10 mg total) by mouth 2 (two) times daily. 60 tablet 4   hydrochlorothiazide (HYDRODIURIL) 25 MG tablet Take 1 tablet (25 mg total) by mouth daily. 90 tablet 2   Insulin Glargine (LANTUS SOLOSTAR) 100 UNIT/ML Solostar Pen Inject 38 Units into the skin daily. 15 mL 11   Insulin Pen Needle (B-D ULTRAFINE III SHORT PEN) 31G X 8 MM MISC 1 application by Does not apply route daily. 60 each 5   Lancet Devices (ACCU-CHEK SOFTCLIX) lancets 1 each by Other route 3 (three) times daily. ICD 10 E11.9 1 each 0   lisinopril (ZESTRIL) 40 MG tablet Take 1 tablet (40 mg total) by mouth daily. 90 tablet 2   methocarbamol (ROBAXIN) 750 MG tablet Take 1 tablet (750 mg total) by mouth 3 (three) times daily as needed (muscle spasm/pain). 20 tablet 0   norethindrone (MICRONOR) 0.35 MG tablet Take 1 tablet (0.35 mg total) by mouth daily. 28 tablet 5   NOVOLOG FLEXPEN 100 UNIT/ML FlexPen INJECT 15 UNITS INTO THE SKIN WITH THE TWO LARGEST MEALS OF THE DAY. 15 mL 2   ondansetron (ZOFRAN ODT) 4 MG  disintegrating tablet Take 1 tablet (4 mg total) by mouth every 8 (eight) hours as needed. 20 tablet 6   Oxycodone HCl 10 MG TABS Take 10 mg by mouth 5 (five) times daily as needed.     pregabalin (LYRICA) 25 MG capsule Take 1 capsule (25 mg total) by mouth 2 (two) times daily. 60 capsule 1   sitaGLIPtin (JANUVIA) 100 MG tablet TAKE 1 TABLET (100 MG TOTAL) BY MOUTH DAILY. 30 tablet 4  SUMAtriptan (IMITREX) 100 MG tablet Take 1 tablet (100 mg total) by mouth once as needed for up to 1 dose for migraine. May repeat in 2 hours if headache persists or recurs. 12 tablet 11   tiZANidine (ZANAFLEX) 4 MG tablet TAKE 1 TABLET BY MOUTH EVERY 8 HOURS AS NEEDED 30 tablet 0   triamcinolone cream (KENALOG) 0.1 % Apply 1 application topically 2 (two) times daily. 30 g 0   Vitamin D, Ergocalciferol, (DRISDOL) 1.25 MG (50000 UNIT) CAPS capsule Take 1 capsule (50,000 Units total) by mouth every 7 (seven) days. 12 capsule 1   No current facility-administered medications for this visit.    REVIEW OF SYSTEMS:   Constitutional: Chronic wheelchair dependency because of her multiple neck and back surgeries All other systems were reviewed with the patient and are negative.  PHYSICAL EXAMINATION: ECOG PERFORMANCE STATUS: 2 - Symptomatic, <50% confined to bed  Vitals:   07/19/21 1233  BP: (!) 164/80  Pulse: 81  Resp: 18  Temp: 97.9 F (36.6 C)  SpO2: 100%   Filed Weights   07/19/21 1233  Weight: 233 lb 12.8 oz (106.1 kg)      LABORATORY DATA:  I have reviewed the data as listed Lab Results  Component Value Date   WBC 7.0 07/04/2021   HGB 8.8 (L) 07/04/2021   HCT 25.7 (L) 07/04/2021   MCV 82 07/04/2021   PLT 177 07/04/2021   Lab Results  Component Value Date   NA 141 06/27/2021   K 4.6 06/27/2021   CL 106 06/27/2021   CO2 21 06/27/2021    RADIOGRAPHIC STUDIES: I have personally reviewed the radiological reports and agreed with the findings in the report.  ASSESSMENT AND PLAN:  Anemia in  chronic kidney disease Normochromic normocytic anemia: Lab review: 10/02/2018: Hemoglobin 9.7, MCV 87, creatinine 1.01 08/11/2020: Hemoglobin 10.4, MCV 80 07/04/2021: Hemoglobin 8.8, MCV 82, creatinine 1.81 Endometrial biopsy 07/04/2021: Weakly proliferative endometrium, negative for atypical hyperplasia/EIN or malignancy  Differential diagnosis: 1. Anemia due to chronic disease and inflammation 2. anemia due to renal dysfunction 3. Combined B-12 and iron deficiency anemias 4. Hemolysis 5. Hypothyroidism 6. Plasma cell disorders myeloma 7. Bone marrow dysfunction with MDS  Workup performed: 1. CBC with differential to evaluate the smear 2. Haptoglobin, LDH, reticulocyte count to evaluate hemolysis 3. TSH 4. SPEP 5. Iron and X-47 and folic acid levels  Patient also had proliferative endometrium causing uterine bleeding.  So the cause of anemia could be multifactorial. I will call the patient in 2 weeks with results and discuss further evaluation.  If her anemia continues to get worse she will need a bone marrow biopsy.     All questions were answered. The patient knows to call the clinic with any problems, questions or concerns.    Harriette Ohara, MD 07/19/21

## 2021-07-18 NOTE — Telephone Encounter (Signed)
Per Dr.Gudena, called pt to request office visit. Pt was scheduled for 12/22 at 1pm. Pt called and made aware of appt. Advised to arrive 15 min early for appt. Pt verbalized understanding

## 2021-07-19 ENCOUNTER — Inpatient Hospital Stay: Payer: Medicaid Other

## 2021-07-19 ENCOUNTER — Inpatient Hospital Stay: Payer: Medicaid Other | Attending: Hematology and Oncology | Admitting: Hematology and Oncology

## 2021-07-19 ENCOUNTER — Other Ambulatory Visit: Payer: Self-pay

## 2021-07-19 DIAGNOSIS — N189 Chronic kidney disease, unspecified: Secondary | ICD-10-CM

## 2021-07-19 DIAGNOSIS — D631 Anemia in chronic kidney disease: Secondary | ICD-10-CM | POA: Diagnosis not present

## 2021-07-19 LAB — CBC WITH DIFFERENTIAL (CANCER CENTER ONLY)
Abs Immature Granulocytes: 0.01 10*3/uL (ref 0.00–0.07)
Basophils Absolute: 0 10*3/uL (ref 0.0–0.1)
Basophils Relative: 0 %
Eosinophils Absolute: 0.2 10*3/uL (ref 0.0–0.5)
Eosinophils Relative: 3 %
HCT: 30.4 % — ABNORMAL LOW (ref 36.0–46.0)
Hemoglobin: 9.6 g/dL — ABNORMAL LOW (ref 12.0–15.0)
Immature Granulocytes: 0 %
Lymphocytes Relative: 29 %
Lymphs Abs: 2.2 10*3/uL (ref 0.7–4.0)
MCH: 26.7 pg (ref 26.0–34.0)
MCHC: 31.6 g/dL (ref 30.0–36.0)
MCV: 84.7 fL (ref 80.0–100.0)
Monocytes Absolute: 0.5 10*3/uL (ref 0.1–1.0)
Monocytes Relative: 6 %
Neutro Abs: 4.5 10*3/uL (ref 1.7–7.7)
Neutrophils Relative %: 62 %
Platelet Count: 223 10*3/uL (ref 150–400)
RBC: 3.59 MIL/uL — ABNORMAL LOW (ref 3.87–5.11)
RDW: 13.9 % (ref 11.5–15.5)
WBC Count: 7.4 10*3/uL (ref 4.0–10.5)
nRBC: 0 % (ref 0.0–0.2)

## 2021-07-19 LAB — CMP (CANCER CENTER ONLY)
ALT: 16 U/L (ref 0–44)
AST: 16 U/L (ref 15–41)
Albumin: 3.7 g/dL (ref 3.5–5.0)
Alkaline Phosphatase: 73 U/L (ref 38–126)
Anion gap: 9 (ref 5–15)
BUN: 37 mg/dL — ABNORMAL HIGH (ref 6–20)
CO2: 23 mmol/L (ref 22–32)
Calcium: 9.3 mg/dL (ref 8.9–10.3)
Chloride: 105 mmol/L (ref 98–111)
Creatinine: 2.55 mg/dL — ABNORMAL HIGH (ref 0.44–1.00)
GFR, Estimated: 24 mL/min — ABNORMAL LOW (ref >60)
Glucose, Bld: 178 mg/dL — ABNORMAL HIGH (ref 70–99)
Potassium: 3.9 mmol/L (ref 3.5–5.1)
Sodium: 137 mmol/L (ref 135–145)
Total Bilirubin: 0.4 mg/dL (ref 0.3–1.2)
Total Protein: 7.3 g/dL (ref 6.5–8.1)

## 2021-07-19 LAB — FOLATE: Folate: 8.8 ng/mL (ref >5.9)

## 2021-07-19 LAB — RETIC PANEL
Immature Retic Fract: 15.6 % (ref 2.3–15.9)
RBC.: 3.54 MIL/uL — ABNORMAL LOW (ref 3.87–5.11)
Retic Count, Absolute: 61.6 10*3/uL (ref 19.0–186.0)
Retic Ct Pct: 1.7 % (ref 0.4–3.1)
Reticulocyte Hemoglobin: 29.7 pg (ref >27.9)

## 2021-07-19 LAB — VITAMIN B12: Vitamin B-12: 352 pg/mL (ref 180–914)

## 2021-07-19 LAB — TSH: TSH: 3.823 u[IU]/mL (ref 0.308–3.960)

## 2021-07-19 LAB — FERRITIN: Ferritin: 18 ng/mL (ref 11–307)

## 2021-07-19 LAB — LACTATE DEHYDROGENASE: LDH: 173 U/L (ref 98–192)

## 2021-07-19 NOTE — Assessment & Plan Note (Signed)
Normochromic normocytic anemia: Lab review: 10/02/2018: Hemoglobin 9.7, MCV 87, creatinine 1.01 08/11/2020: Hemoglobin 10.4, MCV 80 07/04/2021: Hemoglobin 8.8, MCV 82, creatinine 1.81 Endometrial biopsy 07/04/2021: Weakly proliferative endometrium, negative for atypical hyperplasia/EIN or malignancy  Differential diagnosis: 1. Anemia due to chronic disease and inflammation 2. anemia due to renal dysfunction 3. Combined B-12 and iron deficiency anemias 4. Hemolysis 5. Hypothyroidism 6. Plasma cell disorders myeloma 7. Bone marrow dysfunction with MDS  Workup performed: 1. CBC with differential to evaluate the smear 2. Haptoglobin, LDH, reticulocyte count to evaluate hemolysis 3. TSH 4. SPEP 5. Iron and K-18 and folic acid levels  Patient also had proliferative endometrium causing uterine bleeding.  So the cause of anemia could be multifactorial. I will call the patient in 2 weeks with results and discuss further evaluation.  If her anemia continues to get worse she will need a bone marrow biopsy.

## 2021-07-20 LAB — ERYTHROPOIETIN: Erythropoietin: 10.7 m[IU]/mL (ref 2.6–18.5)

## 2021-07-26 LAB — MULTIPLE MYELOMA PANEL, SERUM
Albumin SerPl Elph-Mcnc: 3.1 g/dL (ref 2.9–4.4)
Albumin/Glob SerPl: 0.9 (ref 0.7–1.7)
Alpha 1: 0.2 g/dL (ref 0.0–0.4)
Alpha2 Glob SerPl Elph-Mcnc: 1 g/dL (ref 0.4–1.0)
B-Globulin SerPl Elph-Mcnc: 1.2 g/dL (ref 0.7–1.3)
Gamma Glob SerPl Elph-Mcnc: 1 g/dL (ref 0.4–1.8)
Globulin, Total: 3.5 g/dL (ref 2.2–3.9)
IgA: 502 mg/dL — ABNORMAL HIGH (ref 87–352)
IgG (Immunoglobin G), Serum: 1034 mg/dL (ref 586–1602)
IgM (Immunoglobulin M), Srm: 75 mg/dL (ref 26–217)
Total Protein ELP: 6.6 g/dL (ref 6.0–8.5)

## 2021-08-02 ENCOUNTER — Ambulatory Visit (INDEPENDENT_AMBULATORY_CARE_PROVIDER_SITE_OTHER): Payer: Medicaid Other

## 2021-08-02 ENCOUNTER — Other Ambulatory Visit: Payer: Self-pay

## 2021-08-02 ENCOUNTER — Ambulatory Visit (INDEPENDENT_AMBULATORY_CARE_PROVIDER_SITE_OTHER): Payer: Medicaid Other | Admitting: Orthopedic Surgery

## 2021-08-02 DIAGNOSIS — M545 Low back pain, unspecified: Secondary | ICD-10-CM | POA: Diagnosis not present

## 2021-08-02 DIAGNOSIS — M25552 Pain in left hip: Secondary | ICD-10-CM | POA: Diagnosis not present

## 2021-08-03 ENCOUNTER — Encounter: Payer: Self-pay | Admitting: Orthopedic Surgery

## 2021-08-03 DIAGNOSIS — Z79899 Other long term (current) drug therapy: Secondary | ICD-10-CM | POA: Diagnosis not present

## 2021-08-03 DIAGNOSIS — M549 Dorsalgia, unspecified: Secondary | ICD-10-CM | POA: Diagnosis not present

## 2021-08-03 DIAGNOSIS — M542 Cervicalgia: Secondary | ICD-10-CM | POA: Diagnosis not present

## 2021-08-03 DIAGNOSIS — M1990 Unspecified osteoarthritis, unspecified site: Secondary | ICD-10-CM | POA: Diagnosis not present

## 2021-08-03 DIAGNOSIS — R030 Elevated blood-pressure reading, without diagnosis of hypertension: Secondary | ICD-10-CM | POA: Diagnosis not present

## 2021-08-03 DIAGNOSIS — E119 Type 2 diabetes mellitus without complications: Secondary | ICD-10-CM | POA: Diagnosis not present

## 2021-08-03 DIAGNOSIS — G8929 Other chronic pain: Secondary | ICD-10-CM | POA: Diagnosis not present

## 2021-08-03 DIAGNOSIS — Z6841 Body Mass Index (BMI) 40.0 and over, adult: Secondary | ICD-10-CM | POA: Diagnosis not present

## 2021-08-03 NOTE — Progress Notes (Signed)
Office Visit Note   Patient: Briana Williams           Date of Birth: 16-Apr-1981           MRN: [id number] Visit Date: 08/02/2021              Requested by: Ladell Pier, MD [address] [place],  [place] [address] PCP: Ladell Pier, MD  Chief Complaint  Patient presents with   Left Hip - Pain      HPI: Patient is a 41 year old woman who presents with acute pain left buttocks region.  She states that something feels like it is locking up in her hip.  She denies any falls denies any specific injury.  She states that both walking and sleeping hurts.  Assessment & Plan: Visit Diagnoses:  1. Acute left-sided low back pain, unspecified whether sciatica present   2. Pain in left hip     Plan: Will have patient follow-up with Dr. Ernestina Patches for a left hip intra-articular injection.  Discussed the possible need for total hip replacement.  Follow-Up Instructions: Return in about 1 week (around 08/09/2021) for Follow-up with Dr. Ernestina Patches for evaluation for left hip intra-articular injection.   Ortho Exam  Patient is alert, oriented, no adenopathy, well-dressed, normal affect, normal respiratory effort. Examination patient has limited range of motion of the left hip with external rotation of 20 degrees internal rotation of 0 degrees there is no pain with passive range of motion no clinical signs of a nondisplaced femoral neck fracture.  She has no focal motor weakness.  Negative sciatic tension sign  Imaging: No results found. No images are attached to the encounter.  Labs: Lab Results  Component Value Date   HGBA1C 8.9 (A) 06/27/2021   HGBA1C 9.2 (H) 03/22/2021   HGBA1C 8.2 (A) 08/11/2020   ESRSEDRATE 73 (H) 09/15/2018   ESRSEDRATE 44 (H) 07/11/2018   ESRSEDRATE 21 (H) 12/10/2016   CRP 2.7 (H) 07/11/2018   CRP 5.3 12/10/2016   REPTSTATUS 10/08/2018 FINAL 10/03/2018   CULT  10/03/2018    NO GROWTH 5 DAYS Performed at [facility], [place] [address],  [place], [place] [address]    LABORGA ESCHERICHIA COLI 01/12/2011     Lab Results  Component Value Date   ALBUMIN 3.7 07/19/2021   ALBUMIN 3.9 08/11/2020   ALBUMIN 3.1 (L) 10/04/2018    No results found for: MG No results found for: VD25OH  No results found for: PREALBUMIN CBC EXTENDED Latest Ref Rng & Units 07/19/2021 07/19/2021 07/04/2021  WBC 4.0 - 10.5 K/uL 7.4 - 7.0  RBC 3.87 - 5.11 MIL/uL 3.54(L) 3.59(L) 3.13(L)  HGB 12.0 - 15.0 g/dL 9.6(L) - 8.8(L)  HCT 36.0 - 46.0 % 30.4(L) - 25.7(L)  PLT 150 - 400 K/uL 223 - 177  NEUTROABS 1.7 - 7.7 K/uL 4.5 - -  LYMPHSABS 0.7 - 4.0 K/uL 2.2 - -     There is no height or weight on file to calculate BMI.  Orders:  Orders Placed This Encounter  Procedures   XR Lumbar Spine 2-3 Views   XR HIP UNILAT W OR W/O PELVIS 2-3 VIEWS LEFT   Ambulatory referral to Physical Medicine Rehab   No orders of the defined types were placed in this encounter.    Procedures: No procedures performed  Clinical Data: No additional findings.  ROS:  All other systems negative, except as noted in the HPI. Review of Systems  Objective: Vital Signs: There were  no vitals taken for this visit.  Specialty Comments:  No specialty comments available.  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 ([place]) 02/19/2021   Anemia in chronic kidney disease 12/10/2020   Nephrotic range proteinuria 12/10/2020   Influenza vaccine refused 08/11/2020   Cervical myelopathy ([facility]) 08/11/2020   Closed fracture of odontoid process of axis ([facility]) 06/11/2019   Chronic migraine 11/19/2018   AKI (acute kidney injury) ([place]) 10/03/2018   Morbid obesity ([place]) 07/12/2018   Muscle spasm of back 04/24/2017   Osteoarthritis of both hips 01/15/2017   Recurrent headache 01/06/2017   Irregular menstrual cycle 05/30/2016   Chronic pain syndrome 09/21/2015   HYPERTRIGLYCERIDEMIA  05/02/2009   Insomnia 05/01/2009   SCOLIOSIS 05/18/2008   Diabetes type 2, uncontrolled 04/18/2008   DEPRESSION 04/18/2008   Essential hypertension, benign 04/18/2008   Past Medical History:  Diagnosis Date   Arthritis    GERD (gastroesophageal reflux disease)    Headache    migraine 2 weeks ago   Hyperlipidemia    Hypertension associated with diabetes ([place])    Renal disorder    has both kidneys, compacted and 'squashed together"   Scoliosis    Sleep apnea    in the midst of being tested for it.   waiting on scheduling    Type 2 diabetes mellitus treated with insulin ([place])    dx 2000    Family History  Problem Relation Age of Onset   Cancer Maternal Grandmother        mandibular cancer    Diabetes Mother    Heart failure Mother    CAD Mother        triple CABG   Diabetes Maternal Aunt    Stroke Father     Past Surgical History:  Procedure Laterality Date   BACK SURGERY     CERVICAL FUSION     frontal closed sutures to forehead     Harrington Rod placement     HIP SURGERY     staph infection   LUMBAR FUSION     RADIOLOGY WITH ANESTHESIA N/A 02/25/2017   Procedure: RADIOLOGY WITH ANESTHESIA/MRI THORACIC SPINE AND LEFT SHOULDER WITHOUT CONTRAST.;  Surgeon: Radiologist, Medication, MD;  Location: [facility];  Service: Radiology;  Laterality: N/A;   Social History   Occupational History    Employer: UNEMPLOYED  Tobacco Use   Smoking status: Never   Smokeless tobacco: Never  Substance and Sexual Activity   Alcohol use: No   Drug use: No   Sexual activity: Yes    Birth control/protection: Other-see comments    Comment: unable to have children d/t scoliosis

## 2021-08-07 NOTE — Assessment & Plan Note (Signed)
Normochromic normocytic anemia: Lab review: 10/02/2018: Hemoglobin 9.7, MCV 87, creatinine 1.01 08/11/2020: Hemoglobin 10.4, MCV 80 07/04/2021: Hemoglobin 8.8, MCV 82, creatinine 1.81 Endometrial biopsy 07/04/2021: Weakly proliferative endometrium, negative for atypical hyperplasia/EIN or malignancy  Differential diagnosis: 1. Anemia due to chronic disease and inflammation 2. anemia due to renal dysfunction 3. Combined B-12 and iron deficiency anemias 4. Hemolysis 5. Hypothyroidism 6. Plasma cell disorders myeloma 7. Bone marrow dysfunction with MDS  Workup performed: 1. CBC with differential to evaluate the smear 2. Haptoglobin, LDH, reticulocyte count to evaluate hemolysis 3. TSH 4. SPEP 5. Iron and W-78 and folic acid levels  Patient also had proliferative endometrium causing uterine bleeding.  So the cause of anemia could be multifactorial. I will call the patient in 2 weeks with results and discuss further evaluation.  If her anemia continues to get worse she will need a bone marrow biopsy.

## 2021-08-08 ENCOUNTER — Inpatient Hospital Stay: Payer: Medicaid Other | Attending: Hematology and Oncology | Admitting: Hematology and Oncology

## 2021-08-08 DIAGNOSIS — N189 Chronic kidney disease, unspecified: Secondary | ICD-10-CM | POA: Diagnosis not present

## 2021-08-08 DIAGNOSIS — D631 Anemia in chronic kidney disease: Secondary | ICD-10-CM

## 2021-08-08 NOTE — Progress Notes (Signed)
HEMATOLOGY-ONCOLOGY TELEPHONE VISIT PROGRESS NOTE  I connected with Matthias Hughs on 08/08/21 at 10:45 AM EST by telephone and verified that I am speaking with the correct person using two identifiers.  I discussed the limitations, risks, security and privacy concerns of performing an evaluation and management service by telephone and the availability of in person appointments.  I also discussed with the patient that there may be a patient responsible charge related to this service. The patient expressed understanding and agreed to proceed.   History of Present Illness: Briana Williams is a 41 year old with above-mentioned history of anemia of chronic kidney disease who underwent extensive blood work and is here today to discuss results by phone.    REVIEW OF SYSTEMS:   Constitutional: Denies fevers, chills or abnormal weight loss   All other systems were reviewed with the patient and are negative. Observations/Objective:     Assessment Plan:  Anemia in chronic kidney disease Normochromic normocytic anemia: Lab review: 10/02/2018: Hemoglobin 9.7, MCV 87, creatinine 1.01 08/11/2020: Hemoglobin 10.4, MCV 80 07/04/2021: Hemoglobin 8.8, MCV 82, creatinine 1.81 Endometrial biopsy 07/04/2021: Weakly proliferative endometrium, negative for atypical hyperplasia/EIN or malignancy   Workup performed: CBC with differential to evaluate the smear: Hemoglobin 9.6, H60 and folic acid: Normal, ferritin 16, erythropoietin 10 SPEP: Normal   Patient also had proliferative endometrium causing uterine bleeding.  So the cause of anemia could be multifactorial.   Recommendation: The lab results confirm that this is anemia of chronic kidney disease.  We discussed the role of erythropoietin stimulating agents as a treatment option for anemia of chronic kidney disease. However since her hemoglobin appears to be slowly getting better we decided to watch and monitor it in 2 months for the recheck of her CBC  and then make a decision.  Return to clinic in 2 months with labs and follow-up   I discussed the assessment and treatment plan with the patient. The patient was provided an opportunity to ask questions and all were answered. The patient agreed with the plan and demonstrated an understanding of the instructions. The patient was advised to call back or seek an in-person evaluation if the symptoms worsen or if the condition fails to improve as anticipated.   I provided 11 minutes of non-face-to-face time during this encounter. Harriette Ohara, MD  She is an 41

## 2021-08-09 ENCOUNTER — Other Ambulatory Visit: Payer: Self-pay

## 2021-08-09 ENCOUNTER — Telehealth: Payer: Self-pay | Admitting: Hematology and Oncology

## 2021-08-09 MED FILL — Sitagliptin Phosphate Tab 100 MG (Base Equiv): ORAL | 30 days supply | Qty: 30 | Fill #0 | Status: CN

## 2021-08-09 MED FILL — Sitagliptin Phosphate Tab 100 MG (Base Equiv): ORAL | Qty: 30 | Fill #0 | Status: CN

## 2021-08-09 NOTE — Telephone Encounter (Signed)
Scheduled appointment per 11/1 los. Patient is aware. 

## 2021-08-14 ENCOUNTER — Ambulatory Visit: Payer: Medicaid Other | Admitting: Physical Medicine and Rehabilitation

## 2021-08-16 ENCOUNTER — Other Ambulatory Visit: Payer: Self-pay

## 2021-08-16 ENCOUNTER — Inpatient Hospital Stay: Admission: RE | Admit: 2021-08-16 | Payer: Medicaid Other | Source: Ambulatory Visit

## 2021-08-17 DIAGNOSIS — N2581 Secondary hyperparathyroidism of renal origin: Secondary | ICD-10-CM | POA: Diagnosis not present

## 2021-08-17 DIAGNOSIS — N183 Chronic kidney disease, stage 3 unspecified: Secondary | ICD-10-CM | POA: Diagnosis not present

## 2021-08-17 DIAGNOSIS — R809 Proteinuria, unspecified: Secondary | ICD-10-CM | POA: Diagnosis not present

## 2021-08-17 DIAGNOSIS — D631 Anemia in chronic kidney disease: Secondary | ICD-10-CM | POA: Diagnosis not present

## 2021-08-17 DIAGNOSIS — N189 Chronic kidney disease, unspecified: Secondary | ICD-10-CM | POA: Diagnosis not present

## 2021-08-20 ENCOUNTER — Other Ambulatory Visit: Payer: Self-pay | Admitting: Nephrology

## 2021-08-20 DIAGNOSIS — R809 Proteinuria, unspecified: Secondary | ICD-10-CM

## 2021-08-20 DIAGNOSIS — N183 Chronic kidney disease, stage 3 unspecified: Secondary | ICD-10-CM

## 2021-08-20 DIAGNOSIS — D631 Anemia in chronic kidney disease: Secondary | ICD-10-CM

## 2021-08-20 DIAGNOSIS — N2581 Secondary hyperparathyroidism of renal origin: Secondary | ICD-10-CM

## 2021-08-20 DIAGNOSIS — N189 Chronic kidney disease, unspecified: Secondary | ICD-10-CM

## 2021-08-27 ENCOUNTER — Other Ambulatory Visit (HOSPITAL_COMMUNITY): Payer: Self-pay | Admitting: *Deleted

## 2021-08-27 ENCOUNTER — Ambulatory Visit: Payer: Medicaid Other | Admitting: Internal Medicine

## 2021-08-28 ENCOUNTER — Ambulatory Visit (HOSPITAL_COMMUNITY)
Admission: RE | Admit: 2021-08-28 | Discharge: 2021-08-28 | Disposition: A | Discharge status: Home / Self Care | Payer: Medicaid Other | Source: Ambulatory Visit | Attending: Nephrology | Admitting: Nephrology

## 2021-08-28 ENCOUNTER — Other Ambulatory Visit: Payer: Self-pay | Admitting: Internal Medicine

## 2021-08-28 VITALS — BP 167/79 | HR 80 | Temp 97.7°F | Resp 20 | Wt 222.0 lb

## 2021-08-28 DIAGNOSIS — G4700 Insomnia, unspecified: Secondary | ICD-10-CM

## 2021-08-28 DIAGNOSIS — D631 Anemia in chronic kidney disease: Secondary | ICD-10-CM | POA: Insufficient documentation

## 2021-08-28 DIAGNOSIS — N183 Chronic kidney disease, stage 3 unspecified: Secondary | ICD-10-CM | POA: Diagnosis not present

## 2021-08-28 LAB — POCT HEMOGLOBIN-HEMACUE: Hemoglobin: 9.7 g/dL — ABNORMAL LOW (ref 12.0–15.0)

## 2021-08-28 MED ORDER — SODIUM CHLORIDE 0.9 % IV SOLN
510.0000 mg | INTRAVENOUS | Status: DC
  Administered 2021-08-28: 510 mg via INTRAVENOUS
  Filled 2021-08-28: qty 17

## 2021-08-28 MED ORDER — EPOETIN ALFA-EPBX 10000 UNIT/ML IJ SOLN
10000.0000 [IU] | INTRAMUSCULAR | Status: DC
  Administered 2021-08-28: 10000 [IU] via SUBCUTANEOUS

## 2021-08-28 MED ORDER — EPOETIN ALFA-EPBX 10000 UNIT/ML IJ SOLN
INTRAMUSCULAR | Status: AC
  Filled 2021-08-28: qty 1

## 2021-08-28 NOTE — Telephone Encounter (Signed)
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

## 2021-09-03 DIAGNOSIS — M549 Dorsalgia, unspecified: Secondary | ICD-10-CM | POA: Diagnosis not present

## 2021-09-03 DIAGNOSIS — E119 Type 2 diabetes mellitus without complications: Secondary | ICD-10-CM | POA: Diagnosis not present

## 2021-09-03 DIAGNOSIS — Z79899 Other long term (current) drug therapy: Secondary | ICD-10-CM | POA: Diagnosis not present

## 2021-09-03 DIAGNOSIS — Z6841 Body Mass Index (BMI) 40.0 and over, adult: Secondary | ICD-10-CM | POA: Diagnosis not present

## 2021-09-03 DIAGNOSIS — M542 Cervicalgia: Secondary | ICD-10-CM | POA: Diagnosis not present

## 2021-09-03 DIAGNOSIS — M1990 Unspecified osteoarthritis, unspecified site: Secondary | ICD-10-CM | POA: Diagnosis not present

## 2021-09-03 DIAGNOSIS — R030 Elevated blood-pressure reading, without diagnosis of hypertension: Secondary | ICD-10-CM | POA: Diagnosis not present

## 2021-09-03 DIAGNOSIS — G8929 Other chronic pain: Secondary | ICD-10-CM | POA: Diagnosis not present

## 2021-09-11 ENCOUNTER — Encounter (HOSPITAL_COMMUNITY)
Admission: RE | Admit: 2021-09-11 | Discharge: 2021-09-11 | Disposition: A | Discharge status: Home / Self Care | Payer: Medicaid Other | Source: Ambulatory Visit | Attending: Nephrology | Admitting: Nephrology

## 2021-09-11 ENCOUNTER — Other Ambulatory Visit: Payer: Self-pay

## 2021-09-11 VITALS — BP 165/84 | HR 89 | Temp 97.9°F | Resp 20 | Wt 223.0 lb

## 2021-09-11 DIAGNOSIS — D631 Anemia in chronic kidney disease: Secondary | ICD-10-CM | POA: Insufficient documentation

## 2021-09-11 DIAGNOSIS — N183 Chronic kidney disease, stage 3 unspecified: Secondary | ICD-10-CM | POA: Diagnosis not present

## 2021-09-11 LAB — POCT HEMOGLOBIN-HEMACUE: Hemoglobin: 10 g/dL — ABNORMAL LOW (ref 12.0–15.0)

## 2021-09-11 MED ORDER — EPOETIN ALFA-EPBX 10000 UNIT/ML IJ SOLN
10000.0000 [IU] | INTRAMUSCULAR | Status: DC
  Administered 2021-09-11: 10000 [IU] via SUBCUTANEOUS

## 2021-09-11 MED ORDER — SODIUM CHLORIDE 0.9 % IV SOLN
510.0000 mg | INTRAVENOUS | Status: AC
  Administered 2021-09-11: 510 mg via INTRAVENOUS
  Filled 2021-09-11: qty 510

## 2021-09-11 MED ORDER — EPOETIN ALFA-EPBX 10000 UNIT/ML IJ SOLN
INTRAMUSCULAR | Status: AC
  Filled 2021-09-11: qty 1

## 2021-09-12 ENCOUNTER — Ambulatory Visit: Payer: Self-pay

## 2021-09-12 ENCOUNTER — Encounter: Payer: Self-pay | Admitting: Physical Medicine and Rehabilitation

## 2021-09-12 ENCOUNTER — Ambulatory Visit (INDEPENDENT_AMBULATORY_CARE_PROVIDER_SITE_OTHER): Payer: Medicaid Other | Admitting: Physical Medicine and Rehabilitation

## 2021-09-12 DIAGNOSIS — M25552 Pain in left hip: Secondary | ICD-10-CM | POA: Diagnosis not present

## 2021-09-12 NOTE — Progress Notes (Signed)
Pt state left hip pain. Pt state walking, standing and sitting makes the pain worse. Pt state she takes pain meds to help ease her pain.  Numeric Pain Rating Scale and Functional Assessment Average Pain 9   In the last MONTH (on 0-10 scale) has pain interfered with the following?  1. General activity like being  able to carry out your everyday physical activities such as walking, climbing stairs, carrying groceries, or moving a chair?  Rating(10)   -BT, -Dye Allergies.

## 2021-09-23 MED ORDER — TRIAMCINOLONE ACETONIDE 40 MG/ML IJ SUSP
60.0000 mg | INTRAMUSCULAR | Status: AC | PRN
  Administered 2021-09-12: 60 mg via INTRA_ARTICULAR

## 2021-09-23 MED ORDER — BUPIVACAINE HCL 0.25 % IJ SOLN
4.0000 mL | INTRAMUSCULAR | Status: AC | PRN
  Administered 2021-09-12: 4 mL via INTRA_ARTICULAR

## 2021-09-23 NOTE — Progress Notes (Signed)
° °  Briana Williams - 41 y.o. female MRN [id number]  Date of birth: 08/10/1980  Office Visit Note: Visit Date: 09/12/2021 PCP: Ladell Pier, MD Referred by: Ladell Pier, MD  Subjective: Chief Complaint  Patient presents with   Left Hip - Pain   HPI:  Briana Williams is a 41 y.o. female who comes in today at the request of Dr. Meridee Score for planned Left anesthetic hip arthrogram with fluoroscopic guidance.  The patient has failed conservative care including home exercise, medications, time and activity modification.  This injection will be diagnostic and hopefully therapeutic.  Please see requesting physician notes for further details and justification.  ROS Otherwise per HPI.  Assessment & Plan: Visit Diagnoses:    ICD-10-CM   1. Pain in left hip  M25.552 XR C-ARM NO REPORT      Plan: No additional findings.   Meds & Orders: No orders of the defined types were placed in this encounter.   Orders Placed This Encounter  Procedures   Large Joint Inj   XR C-ARM NO REPORT    Follow-up: Return if symptoms worsen or fail to improve.   Procedures: Large Joint Inj: L hip joint on 09/12/2021 11:00 AM Indications: diagnostic evaluation and pain Details: 22 G 3.5 in needle, fluoroscopy-guided anterior approach  Arthrogram: No  Medications: 4 mL bupivacaine 0.25 %; 60 mg triamcinolone acetonide 40 MG/ML Outcome: tolerated well, no immediate complications  There was excellent flow of contrast producing a partial arthrogram of the hip. The patient did have relief of symptoms during the anesthetic phase of the injection. Procedure, treatment alternatives, risks and benefits explained, specific risks discussed. Consent was given by the patient. Immediately prior to procedure a time out was called to verify the correct patient, procedure, equipment, support staff and site/side marked as required. Patient was prepped and draped in the usual sterile fashion.          Clinical History: Electrodiagnostic study 09/07/2015 at [facility] Abnormal but incomplete study.        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  However, the  patient declined to complete the EMG portion of the study and therefore  conclusions are limited.  Of note, active denervation was observed in the  left first dorsal interosseous muscle.       There is electrodiagnostic evidence of patchy sensory peripheral neuropathy  as can be seen in diabetes.       Clinical correlation is advised.  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     Objective:  VS:  HT:     WT:    BMI:      BP:    HR: bpm   TEMP: ( )   RESP:  Physical Exam   Imaging: No results found.

## 2021-09-24 DIAGNOSIS — N183 Chronic kidney disease, stage 3 unspecified: Secondary | ICD-10-CM | POA: Diagnosis not present

## 2021-09-25 ENCOUNTER — Other Ambulatory Visit: Payer: Self-pay

## 2021-09-25 ENCOUNTER — Encounter (HOSPITAL_COMMUNITY)
Admission: RE | Admit: 2021-09-25 | Discharge: 2021-09-25 | Disposition: A | Discharge status: Home / Self Care | Payer: Medicaid Other | Source: Ambulatory Visit | Attending: Nephrology | Admitting: Nephrology

## 2021-09-25 DIAGNOSIS — N183 Chronic kidney disease, stage 3 unspecified: Secondary | ICD-10-CM | POA: Diagnosis not present

## 2021-09-25 DIAGNOSIS — D631 Anemia in chronic kidney disease: Secondary | ICD-10-CM

## 2021-09-25 LAB — IRON AND TIBC
Iron: 53 ug/dL (ref 28–170)
Saturation Ratios: 19 % (ref 10.4–31.8)
TIBC: 283 ug/dL (ref 250–450)
UIBC: 230 ug/dL

## 2021-09-25 LAB — FERRITIN: Ferritin: 185 ng/mL (ref 11–307)

## 2021-09-25 LAB — POCT HEMOGLOBIN-HEMACUE: Hemoglobin: 10.3 g/dL — ABNORMAL LOW (ref 12.0–15.0)

## 2021-09-25 MED ORDER — EPOETIN ALFA-EPBX 10000 UNIT/ML IJ SOLN
10000.0000 [IU] | INTRAMUSCULAR | Status: DC
  Administered 2021-09-25: 10000 [IU] via SUBCUTANEOUS

## 2021-09-25 MED ORDER — EPOETIN ALFA-EPBX 10000 UNIT/ML IJ SOLN
INTRAMUSCULAR | Status: AC
  Filled 2021-09-25: qty 1

## 2021-09-26 DIAGNOSIS — D631 Anemia in chronic kidney disease: Secondary | ICD-10-CM | POA: Diagnosis not present

## 2021-09-26 DIAGNOSIS — N1832 Chronic kidney disease, stage 3b: Secondary | ICD-10-CM | POA: Diagnosis not present

## 2021-09-26 DIAGNOSIS — R809 Proteinuria, unspecified: Secondary | ICD-10-CM | POA: Diagnosis not present

## 2021-09-26 DIAGNOSIS — I129 Hypertensive chronic kidney disease with stage 1 through stage 4 chronic kidney disease, or unspecified chronic kidney disease: Secondary | ICD-10-CM | POA: Diagnosis not present

## 2021-09-26 DIAGNOSIS — N2581 Secondary hyperparathyroidism of renal origin: Secondary | ICD-10-CM | POA: Diagnosis not present

## 2021-10-01 DIAGNOSIS — Z6841 Body Mass Index (BMI) 40.0 and over, adult: Secondary | ICD-10-CM | POA: Diagnosis not present

## 2021-10-01 DIAGNOSIS — G8929 Other chronic pain: Secondary | ICD-10-CM | POA: Diagnosis not present

## 2021-10-01 DIAGNOSIS — Z79899 Other long term (current) drug therapy: Secondary | ICD-10-CM | POA: Diagnosis not present

## 2021-10-01 DIAGNOSIS — M549 Dorsalgia, unspecified: Secondary | ICD-10-CM | POA: Diagnosis not present

## 2021-10-01 DIAGNOSIS — M1990 Unspecified osteoarthritis, unspecified site: Secondary | ICD-10-CM | POA: Diagnosis not present

## 2021-10-01 DIAGNOSIS — R030 Elevated blood-pressure reading, without diagnosis of hypertension: Secondary | ICD-10-CM | POA: Diagnosis not present

## 2021-10-01 DIAGNOSIS — E119 Type 2 diabetes mellitus without complications: Secondary | ICD-10-CM | POA: Diagnosis not present

## 2021-10-01 DIAGNOSIS — M542 Cervicalgia: Secondary | ICD-10-CM | POA: Diagnosis not present

## 2021-10-03 ENCOUNTER — Other Ambulatory Visit: Payer: Self-pay | Admitting: Internal Medicine

## 2021-10-03 ENCOUNTER — Encounter: Payer: Self-pay | Admitting: Internal Medicine

## 2021-10-03 NOTE — Progress Notes (Signed)
Note received from patient's nephrologist Dr. Posey Pronto.  She was seen 09/26/2021. ?Stage IIIb chronic kidney disease: Events suggests advancing chronic kidney disease from underlying diabetes and hypertension along with use of meloxicam.  Patient told him that she had stopped taking lisinopril several months ago because it was causing nausea. ? ?Hypertensive renal disease: Blood pressure elevated at 152/80.  Stop lisinopril.  Start Benicar 20 mg daily. ?Secondary hyperparathyroidism ?Intact parathyroid hormone 83/vitamin D 42.2 ?H/H10.9/33.5 ?GFR 28/creatinine 2.19 ?

## 2021-10-04 NOTE — Progress Notes (Incomplete)
? ?Patient Care Team: ?Ladell Pier, MD as PCP - General (Internal Medicine) ?Jessy Oto, MD as Consulting Physician (Orthopedic Surgery) ? ?DIAGNOSIS: No diagnosis found. ? ?SUMMARY OF ONCOLOGIC HISTORY: ?Oncology History  ? No history exists.  ? ? ?CHIEF COMPLIANT: anemia of chronic kidney disease  ? ?INTERVAL HISTORY: Briana Williams is a  41 year old with above-mentioned history of anemia of chronic kidney disease who underwent extensive blood work and presents to the clinic today for follow-up and labs. ? ? ?ALLERGIES:  is allergic to hydrocodone, lisinopril, metformin and related, norvasc [amlodipine besylate], and ultrasound gel. ? ?MEDICATIONS:  ?Current Outpatient Medications  ?Medication Sig Dispense Refill  ? ACCU-CHEK SOFTCLIX LANCETS lancets 1 each by Other route 3 (three) times daily. ICD 10 E11.9 100 each 12  ? Accu-Chek Softclix Lancets lancets Use as instructed 100 each 12  ? albuterol (PROVENTIL HFA;VENTOLIN HFA) 108 (90 Base) MCG/ACT inhaler Inhale 1 puff into the lungs every 6 (six) hours as needed for wheezing or shortness of breath. 8 g 3  ? amitriptyline (ELAVIL) 25 MG tablet TAKE 1 TABLET(25 MG) BY MOUTH AT BEDTIME 30 tablet 2  ? atorvastatin (LIPITOR) 10 MG tablet Take 1 tablet (10 mg total) by mouth daily. 90 tablet 3  ? Blood Glucose Monitoring Suppl (ACCU-CHEK AVIVA PLUS) w/Device KIT 1 Device by Does not apply route 3 (three) times daily after meals. ICD 10 E11.9 1 kit 0  ? Blood Glucose Monitoring Suppl (ACCU-CHEK GUIDE) w/Device KIT Use as directed 1 kit 0  ? busPIRone (BUSPAR) 10 MG tablet TAKE 1 TABLET(10 MG) BY MOUTH THREE TIMES DAILY 90 tablet 1  ? carvedilol (COREG) 12.5 MG tablet Take 1 tablet (12.5 mg total) by mouth 2 (two) times daily with a meal. 180 tablet 3  ? Erenumab-aooe (AIMOVIG) 70 MG/ML SOAJ Administer 1 mL under the skin every 30 days. Must be seen for further refills. 1 mL 0  ? escitalopram (LEXAPRO) 20 MG tablet TAKE 1 TABLET(20 MG) BY MOUTH DAILY  90 tablet 0  ? ferrous sulfate 325 (65 FE) MG tablet Take 1 tablet (325 mg total) by mouth daily with breakfast. 100 tablet 1  ? glucose blood (ACCU-CHEK AVIVA PLUS) test strip 1 each by Other route 3 (three) times daily. ICD 10 E11.9 100 each 12  ? glucose blood (ACCU-CHEK GUIDE) test strip Test 3x/day before meals 100 each 12  ? hydrALAZINE (APRESOLINE) 10 MG tablet Take 1 tablet (10 mg total) by mouth 2 (two) times daily. 60 tablet 4  ? hydrochlorothiazide (HYDRODIURIL) 25 MG tablet Take 1 tablet (25 mg total) by mouth daily. 90 tablet 2  ? Insulin Glargine (LANTUS SOLOSTAR) 100 UNIT/ML Solostar Pen Inject 38 Units into the skin daily. 15 mL 11  ? Insulin Pen Needle (B-D ULTRAFINE III SHORT PEN) 31G X 8 MM MISC 1 application by Does not apply route daily. 60 each 5  ? Lancet Devices (ACCU-CHEK SOFTCLIX) lancets 1 each by Other route 3 (three) times daily. ICD 10 E11.9 1 each 0  ? methocarbamol (ROBAXIN) 750 MG tablet Take 1 tablet (750 mg total) by mouth 3 (three) times daily as needed (muscle spasm/pain). 20 tablet 0  ? norethindrone (MICRONOR) 0.35 MG tablet Take 1 tablet (0.35 mg total) by mouth daily. 28 tablet 5  ? NOVOLOG FLEXPEN 100 UNIT/ML FlexPen INJECT 15 UNITS INTO THE SKIN WITH THE TWO LARGEST MEALS OF THE DAY. 15 mL 2  ? olmesartan (BENICAR) 20 MG tablet Take 20 mg by  mouth daily.    ? ondansetron (ZOFRAN ODT) 4 MG disintegrating tablet Take 1 tablet (4 mg total) by mouth every 8 (eight) hours as needed. 20 tablet 6  ? Oxycodone HCl 10 MG TABS Take 10 mg by mouth 5 (five) times daily as needed.    ? pregabalin (LYRICA) 25 MG capsule Take 1 capsule (25 mg total) by mouth 2 (two) times daily. 60 capsule 1  ? sitaGLIPtin (JANUVIA) 100 MG tablet TAKE 1 TABLET (100 MG TOTAL) BY MOUTH DAILY. 30 tablet 4  ? SUMAtriptan (IMITREX) 100 MG tablet Take 1 tablet (100 mg total) by mouth once as needed for up to 1 dose for migraine. May repeat in 2 hours if headache persists or recurs. 12 tablet 11  ? tiZANidine  (ZANAFLEX) 4 MG tablet TAKE 1 TABLET BY MOUTH EVERY 8 HOURS AS NEEDED 30 tablet 0  ? triamcinolone cream (KENALOG) 0.1 % Apply 1 application topically 2 (two) times daily. 30 g 0  ? Vitamin D, Ergocalciferol, (DRISDOL) 1.25 MG (50000 UNIT) CAPS capsule Take 1 capsule (50,000 Units total) by mouth every 7 (seven) days. 12 capsule 1  ? ?No current facility-administered medications for this visit.  ? ? ?PHYSICAL EXAMINATION: ?ECOG PERFORMANCE STATUS: {CHL ONC ECOG [id number]:[id number]} ? ?There were no vitals filed for this visit. ?There were no vitals filed for this visit. ? ?BREAST:*** No palpable masses or nodules in either right or left breasts. No palpable axillary supraclavicular or infraclavicular adenopathy no breast tenderness or nipple discharge. (exam performed in the presence of a chaperone) ? ?LABORATORY DATA:  ?I have reviewed the data as listed ?CMP Latest Ref Rng & Units 07/19/2021 06/27/2021 08/11/2020  ?Glucose 70 - 99 mg/dL 178(H) 248(H) 138(H)  ?BUN 6 - 20 mg/dL 37(H) 32(H) 20  ?Creatinine 0.44 - 1.00 mg/dL 2.55(H) 1.81(H) 1.40(H)  ?Sodium 135 - 145 mmol/L 137 141 137  ?Potassium 3.5 - 5.1 mmol/L 3.9 4.6 4.6  ?Chloride 98 - 111 mmol/L 105 106 102  ?CO2 22 - 32 mmol/L _0 ?Calcium 8.9 - 10.3 mg/dL 9.3 8.6(L) 9.1  ?Total Protein 6.5 - 8.1 g/dL 7.3 - 7.0  ?Total Bilirubin 0.3 - 1.2 mg/dL 0.4 - 0.3  ?Alkaline Phos 38 - 126 U/L 73 - 87  ?AST 15 - 41 U/L 16 - 19  ?ALT 0 - 44 U/L 16 - 26  ? ? ?Lab Results  ?Component Value Date  ? WBC 7.4 07/19/2021  ? HGB 10.3 (L) 09/25/2021  ? HCT 30.4 (L) 07/19/2021  ? MCV 84.7 07/19/2021  ? PLT 223 07/19/2021  ? NEUTROABS 4.5 07/19/2021  ? ? ?ASSESSMENT & PLAN:  ?No problem-specific Assessment & Plan notes found for this encounter. ? ? ? ?No orders of the defined types were placed in this encounter. ? ?The patient has a good understanding of the overall plan. she agrees with it. she will call with any problems that may develop before the next visit here. ?Total time  spent: 30 mins including face to face time and time spent for planning, charting and co-ordination of care ? ? Suzzette Righter, CMA ?10/04/21 ? ? ?  ?

## 2021-10-08 ENCOUNTER — Inpatient Hospital Stay: Payer: Medicaid Other | Admitting: Hematology and Oncology

## 2021-10-08 ENCOUNTER — Inpatient Hospital Stay: Payer: Medicaid Other | Attending: Hematology and Oncology

## 2021-10-08 NOTE — Assessment & Plan Note (Deleted)
Normochromic normocytic anemia: ?Lab review: ?10/02/2018: Hemoglobin 9.7, MCV 87, creatinine 1.01 ?08/11/2020: Hemoglobin 10.4, MCV 80 ?07/04/2021: Hemoglobin 8.8, MCV 82, creatinine 1.81 ?Endometrial biopsy 07/04/2021: Weakly proliferative endometrium, negative for atypical hyperplasia/EIN or malignancy ?? ?Workup performed: ?CBC with differential to evaluate the smear: Hemoglobin 9.6, O35 and folic acid: Normal, ferritin 16, erythropoietin 10 ?SPEP: Normal ?? ?Patient also had proliferative endometrium causing uterine bleeding. ?So the cause of anemia could be multifactorial. ?  ?Recommendation: Holding off on starting erythropoietin stimulating agents until her hemoglobin is consistently below 10 ?? ?Return to clinic in 2 months with labs and follow-up ?

## 2021-10-09 ENCOUNTER — Encounter (HOSPITAL_COMMUNITY)
Admission: RE | Admit: 2021-10-09 | Discharge: 2021-10-09 | Disposition: A | Discharge status: Home / Self Care | Payer: Medicaid Other | Source: Ambulatory Visit | Attending: Nephrology | Admitting: Nephrology

## 2021-10-09 VITALS — BP 173/105 | HR 87 | Temp 97.7°F | Resp 18

## 2021-10-09 DIAGNOSIS — N183 Chronic kidney disease, stage 3 unspecified: Secondary | ICD-10-CM | POA: Insufficient documentation

## 2021-10-09 DIAGNOSIS — D631 Anemia in chronic kidney disease: Secondary | ICD-10-CM | POA: Insufficient documentation

## 2021-10-09 LAB — POCT HEMOGLOBIN-HEMACUE: Hemoglobin: 12.7 g/dL (ref 12.0–15.0)

## 2021-10-09 MED ORDER — EPOETIN ALFA-EPBX 10000 UNIT/ML IJ SOLN: 10000.0000 [IU] | INTRAMUSCULAR | Status: DC

## 2021-10-23 ENCOUNTER — Encounter (HOSPITAL_COMMUNITY)
Admission: RE | Admit: 2021-10-23 | Discharge: 2021-10-23 | Disposition: A | Discharge status: Home / Self Care | Payer: Medicaid Other | Source: Ambulatory Visit | Attending: Nephrology | Admitting: Nephrology

## 2021-10-23 ENCOUNTER — Other Ambulatory Visit: Payer: Self-pay

## 2021-10-23 VITALS — BP 148/80 | HR 67 | Temp 97.6°F | Resp 18

## 2021-10-23 DIAGNOSIS — N183 Chronic kidney disease, stage 3 unspecified: Secondary | ICD-10-CM | POA: Diagnosis not present

## 2021-10-23 DIAGNOSIS — D631 Anemia in chronic kidney disease: Secondary | ICD-10-CM | POA: Diagnosis not present

## 2021-10-23 LAB — IRON AND TIBC
Iron: 60 ug/dL (ref 28–170)
Saturation Ratios: 21 % (ref 10.4–31.8)
TIBC: 286 ug/dL (ref 250–450)
UIBC: 226 ug/dL

## 2021-10-23 LAB — POCT HEMOGLOBIN-HEMACUE: Hemoglobin: 9.8 g/dL — ABNORMAL LOW (ref 12.0–15.0)

## 2021-10-23 LAB — FERRITIN: Ferritin: 86 ng/mL (ref 11–307)

## 2021-10-23 MED ORDER — EPOETIN ALFA-EPBX 10000 UNIT/ML IJ SOLN
INTRAMUSCULAR | Status: AC
  Filled 2021-10-23: qty 1

## 2021-10-23 MED ORDER — EPOETIN ALFA-EPBX 10000 UNIT/ML IJ SOLN
10000.0000 [IU] | INTRAMUSCULAR | Status: DC
  Administered 2021-10-23: 10000 [IU] via SUBCUTANEOUS

## 2021-10-27 ENCOUNTER — Other Ambulatory Visit: Payer: Self-pay | Admitting: Internal Medicine

## 2021-10-27 DIAGNOSIS — G4700 Insomnia, unspecified: Secondary | ICD-10-CM

## 2021-10-29 NOTE — Telephone Encounter (Signed)
Requested Prescriptions  ?Pending Prescriptions Disp Refills  ?? amitriptyline (ELAVIL) 25 MG tablet [Pharmacy Med Name: AMITRIPTYLINE 25MG  TABLETS] 30 tablet 2  ?  Sig: TAKE 1 TABLET(25 MG) BY MOUTH AT BEDTIME  ?  ? Psychiatry:  Antidepressants - Heterocyclics (TCAs) Passed - 10/27/2021  8:07 AM  ?  ?  Passed - Completed PHQ-2 or PHQ-9 in the last 360 days  ?  ?  Passed - Valid encounter within last 6 months  ?  Recent Outpatient Visits   ?      ? 3 months ago Kyphoscoliosis deformity of spine  ? [place] Karle Plumber B, MD  ? 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 [place] Karle Plumber B, MD  ? 8 months ago Controlled type 2 diabetes mellitus with macroalbuminuric diabetic nephropathy ([place])  ? [place] Ladell Pier, MD  ? 1 year ago Essential hypertension  ? [place], RPH-CPP  ? 1 year ago Type 2 diabetes mellitus with obesity ([place])  ? [place] Ladell Pier, MD  ?  ?  ?Future Appointments   ?        ? In 3 months Ladell Pier, MD [place]  ?  ? ?  ?  ?  ? ?

## 2021-10-31 DIAGNOSIS — E119 Type 2 diabetes mellitus without complications: Secondary | ICD-10-CM | POA: Diagnosis not present

## 2021-10-31 DIAGNOSIS — Z6841 Body Mass Index (BMI) 40.0 and over, adult: Secondary | ICD-10-CM | POA: Diagnosis not present

## 2021-10-31 DIAGNOSIS — Z79899 Other long term (current) drug therapy: Secondary | ICD-10-CM | POA: Diagnosis not present

## 2021-10-31 DIAGNOSIS — M549 Dorsalgia, unspecified: Secondary | ICD-10-CM | POA: Diagnosis not present

## 2021-10-31 DIAGNOSIS — R030 Elevated blood-pressure reading, without diagnosis of hypertension: Secondary | ICD-10-CM | POA: Diagnosis not present

## 2021-10-31 DIAGNOSIS — M1990 Unspecified osteoarthritis, unspecified site: Secondary | ICD-10-CM | POA: Diagnosis not present

## 2021-10-31 DIAGNOSIS — M542 Cervicalgia: Secondary | ICD-10-CM | POA: Diagnosis not present

## 2021-10-31 DIAGNOSIS — G8929 Other chronic pain: Secondary | ICD-10-CM | POA: Diagnosis not present

## 2021-11-06 ENCOUNTER — Encounter (HOSPITAL_COMMUNITY)
Admission: RE | Admit: 2021-11-06 | Discharge: 2021-11-06 | Disposition: A | Discharge status: Home / Self Care | Payer: Medicaid Other | Source: Ambulatory Visit | Attending: Nephrology | Admitting: Nephrology

## 2021-11-06 ENCOUNTER — Encounter (HOSPITAL_COMMUNITY): Payer: Medicaid Other

## 2021-11-06 DIAGNOSIS — D631 Anemia in chronic kidney disease: Secondary | ICD-10-CM | POA: Diagnosis not present

## 2021-11-06 DIAGNOSIS — N189 Chronic kidney disease, unspecified: Secondary | ICD-10-CM | POA: Diagnosis not present

## 2021-11-06 MED ORDER — SODIUM CHLORIDE 0.9 % IV SOLN
510.0000 mg | INTRAVENOUS | Status: DC
  Administered 2021-11-06: 510 mg via INTRAVENOUS
  Filled 2021-11-06: qty 510

## 2021-11-13 ENCOUNTER — Encounter (HOSPITAL_COMMUNITY)
Admission: RE | Admit: 2021-11-13 | Discharge: 2021-11-13 | Disposition: A | Discharge status: Home / Self Care | Payer: Medicaid Other | Source: Ambulatory Visit | Attending: Nephrology | Admitting: Nephrology

## 2021-11-13 DIAGNOSIS — N189 Chronic kidney disease, unspecified: Secondary | ICD-10-CM | POA: Diagnosis not present

## 2021-11-13 DIAGNOSIS — D631 Anemia in chronic kidney disease: Secondary | ICD-10-CM | POA: Diagnosis not present

## 2021-11-13 MED ORDER — SODIUM CHLORIDE 0.9 % IV SOLN
510.0000 mg | INTRAVENOUS | Status: AC
  Administered 2021-11-13: 510 mg via INTRAVENOUS
  Filled 2021-11-13: qty 510

## 2021-11-28 DIAGNOSIS — Z79899 Other long term (current) drug therapy: Secondary | ICD-10-CM | POA: Diagnosis not present

## 2021-11-28 DIAGNOSIS — M549 Dorsalgia, unspecified: Secondary | ICD-10-CM | POA: Diagnosis not present

## 2021-11-28 DIAGNOSIS — M542 Cervicalgia: Secondary | ICD-10-CM | POA: Diagnosis not present

## 2021-11-28 DIAGNOSIS — Z6841 Body Mass Index (BMI) 40.0 and over, adult: Secondary | ICD-10-CM | POA: Diagnosis not present

## 2021-11-28 DIAGNOSIS — R030 Elevated blood-pressure reading, without diagnosis of hypertension: Secondary | ICD-10-CM | POA: Diagnosis not present

## 2021-11-28 DIAGNOSIS — M1990 Unspecified osteoarthritis, unspecified site: Secondary | ICD-10-CM | POA: Diagnosis not present

## 2021-11-28 DIAGNOSIS — G8929 Other chronic pain: Secondary | ICD-10-CM | POA: Diagnosis not present

## 2021-11-28 DIAGNOSIS — E119 Type 2 diabetes mellitus without complications: Secondary | ICD-10-CM | POA: Diagnosis not present

## 2021-11-28 DIAGNOSIS — E559 Vitamin D deficiency, unspecified: Secondary | ICD-10-CM | POA: Diagnosis not present

## 2021-12-12 ENCOUNTER — Encounter: Payer: Self-pay | Admitting: Internal Medicine

## 2021-12-12 NOTE — Progress Notes (Signed)
Received lab results from NP Jeanella Anton from [facility]. ?Labs collected 11/28/2021. ?Vitamin D level 12.89 ?CBC revealed H/H of 10.3/33 which is improved. ?Creatinine 2.3, GFR 23, calcium 8.3, albumin 3.3, CO2 of 23. ?

## 2021-12-17 ENCOUNTER — Inpatient Hospital Stay (HOSPITAL_COMMUNITY)
Admission: EM | Admit: 2021-12-17 | Discharge: 2021-12-21 | DRG: 418 | Disposition: A | Discharge status: Home / Self Care | Payer: Medicaid Other | Attending: Family Medicine | Admitting: Family Medicine

## 2021-12-17 ENCOUNTER — Ambulatory Visit: Payer: Self-pay

## 2021-12-17 ENCOUNTER — Encounter (HOSPITAL_COMMUNITY): Payer: Self-pay | Admitting: Emergency Medicine

## 2021-12-17 ENCOUNTER — Emergency Department (HOSPITAL_COMMUNITY): Payer: Medicaid Other

## 2021-12-17 ENCOUNTER — Other Ambulatory Visit: Payer: Self-pay

## 2021-12-17 DIAGNOSIS — G43909 Migraine, unspecified, not intractable, without status migrainosus: Secondary | ICD-10-CM | POA: Diagnosis present

## 2021-12-17 DIAGNOSIS — Z833 Family history of diabetes mellitus: Secondary | ICD-10-CM

## 2021-12-17 DIAGNOSIS — R1011 Right upper quadrant pain: Secondary | ICD-10-CM | POA: Diagnosis not present

## 2021-12-17 DIAGNOSIS — Z79891 Long term (current) use of opiate analgesic: Secondary | ICD-10-CM

## 2021-12-17 DIAGNOSIS — I160 Hypertensive urgency: Secondary | ICD-10-CM

## 2021-12-17 DIAGNOSIS — Z79899 Other long term (current) drug therapy: Secondary | ICD-10-CM

## 2021-12-17 DIAGNOSIS — D631 Anemia in chronic kidney disease: Secondary | ICD-10-CM | POA: Diagnosis not present

## 2021-12-17 DIAGNOSIS — N179 Acute kidney failure, unspecified: Secondary | ICD-10-CM | POA: Diagnosis present

## 2021-12-17 DIAGNOSIS — K5730 Diverticulosis of large intestine without perforation or abscess without bleeding: Secondary | ICD-10-CM | POA: Diagnosis not present

## 2021-12-17 DIAGNOSIS — I10 Essential (primary) hypertension: Secondary | ICD-10-CM

## 2021-12-17 DIAGNOSIS — T465X6A Underdosing of other antihypertensive drugs, initial encounter: Secondary | ICD-10-CM | POA: Diagnosis present

## 2021-12-17 DIAGNOSIS — G4700 Insomnia, unspecified: Secondary | ICD-10-CM | POA: Diagnosis not present

## 2021-12-17 DIAGNOSIS — N183 Chronic kidney disease, stage 3 unspecified: Secondary | ICD-10-CM

## 2021-12-17 DIAGNOSIS — N189 Chronic kidney disease, unspecified: Secondary | ICD-10-CM

## 2021-12-17 DIAGNOSIS — R079 Chest pain, unspecified: Secondary | ICD-10-CM | POA: Diagnosis not present

## 2021-12-17 DIAGNOSIS — K8020 Calculus of gallbladder without cholecystitis without obstruction: Secondary | ICD-10-CM | POA: Diagnosis not present

## 2021-12-17 DIAGNOSIS — Z794 Long term (current) use of insulin: Secondary | ICD-10-CM

## 2021-12-17 DIAGNOSIS — Z8739 Personal history of other diseases of the musculoskeletal system and connective tissue: Secondary | ICD-10-CM | POA: Diagnosis not present

## 2021-12-17 DIAGNOSIS — E669 Obesity, unspecified: Secondary | ICD-10-CM

## 2021-12-17 DIAGNOSIS — E119 Type 2 diabetes mellitus without complications: Secondary | ICD-10-CM

## 2021-12-17 DIAGNOSIS — N200 Calculus of kidney: Secondary | ICD-10-CM | POA: Diagnosis not present

## 2021-12-17 DIAGNOSIS — E781 Pure hyperglyceridemia: Secondary | ICD-10-CM | POA: Diagnosis not present

## 2021-12-17 DIAGNOSIS — R0602 Shortness of breath: Secondary | ICD-10-CM | POA: Diagnosis not present

## 2021-12-17 DIAGNOSIS — K219 Gastro-esophageal reflux disease without esophagitis: Secondary | ICD-10-CM | POA: Diagnosis present

## 2021-12-17 DIAGNOSIS — F32A Depression, unspecified: Secondary | ICD-10-CM | POA: Diagnosis present

## 2021-12-17 DIAGNOSIS — M4326 Fusion of spine, lumbar region: Secondary | ICD-10-CM | POA: Diagnosis not present

## 2021-12-17 DIAGNOSIS — M1990 Unspecified osteoarthritis, unspecified site: Secondary | ICD-10-CM | POA: Diagnosis present

## 2021-12-17 DIAGNOSIS — E1122 Type 2 diabetes mellitus with diabetic chronic kidney disease: Secondary | ICD-10-CM | POA: Diagnosis present

## 2021-12-17 DIAGNOSIS — R0789 Other chest pain: Secondary | ICD-10-CM

## 2021-12-17 DIAGNOSIS — Z885 Allergy status to narcotic agent status: Secondary | ICD-10-CM

## 2021-12-17 DIAGNOSIS — K8010 Calculus of gallbladder with chronic cholecystitis without obstruction: Principal | ICD-10-CM | POA: Diagnosis present

## 2021-12-17 DIAGNOSIS — Z981 Arthrodesis status: Secondary | ICD-10-CM

## 2021-12-17 DIAGNOSIS — G894 Chronic pain syndrome: Secondary | ICD-10-CM | POA: Diagnosis not present

## 2021-12-17 DIAGNOSIS — Z888 Allergy status to other drugs, medicaments and biological substances status: Secondary | ICD-10-CM

## 2021-12-17 DIAGNOSIS — E785 Hyperlipidemia, unspecified: Secondary | ICD-10-CM | POA: Diagnosis present

## 2021-12-17 DIAGNOSIS — E1169 Type 2 diabetes mellitus with other specified complication: Secondary | ICD-10-CM | POA: Diagnosis not present

## 2021-12-17 DIAGNOSIS — N1832 Chronic kidney disease, stage 3b: Secondary | ICD-10-CM | POA: Diagnosis present

## 2021-12-17 DIAGNOSIS — K810 Acute cholecystitis: Secondary | ICD-10-CM

## 2021-12-17 DIAGNOSIS — Z6841 Body Mass Index (BMI) 40.0 and over, adult: Secondary | ICD-10-CM

## 2021-12-17 DIAGNOSIS — R002 Palpitations: Secondary | ICD-10-CM | POA: Diagnosis not present

## 2021-12-17 DIAGNOSIS — I959 Hypotension, unspecified: Secondary | ICD-10-CM | POA: Diagnosis not present

## 2021-12-17 DIAGNOSIS — Z91048 Other nonmedicinal substance allergy status: Secondary | ICD-10-CM

## 2021-12-17 DIAGNOSIS — I152 Hypertension secondary to endocrine disorders: Secondary | ICD-10-CM | POA: Diagnosis present

## 2021-12-17 DIAGNOSIS — F332 Major depressive disorder, recurrent severe without psychotic features: Secondary | ICD-10-CM | POA: Diagnosis present

## 2021-12-17 DIAGNOSIS — Z91148 Patient's other noncompliance with medication regimen for other reason: Secondary | ICD-10-CM

## 2021-12-17 DIAGNOSIS — M4120 Other idiopathic scoliosis, site unspecified: Secondary | ICD-10-CM | POA: Diagnosis not present

## 2021-12-17 LAB — URINALYSIS, ROUTINE W REFLEX MICROSCOPIC
Bilirubin Urine: NEGATIVE
Glucose, UA: >=500 mg/dL — AB
Ketones, ur: NEGATIVE mg/dL
Leukocytes,Ua: NEGATIVE
Nitrite: NEGATIVE
Protein, ur: >=300 mg/dL — AB
Specific Gravity, Urine: 1.012 (ref 1.005–1.030)
pH: 6 (ref 5.0–8.0)

## 2021-12-17 LAB — BASIC METABOLIC PANEL
Anion gap: 8 (ref 5–15)
BUN: 31 mg/dL — ABNORMAL HIGH (ref 6–20)
CO2: 19 mmol/L — ABNORMAL LOW (ref 22–32)
Calcium: 8.5 mg/dL — ABNORMAL LOW (ref 8.9–10.3)
Chloride: 108 mmol/L (ref 98–111)
Creatinine, Ser: 2.65 mg/dL — ABNORMAL HIGH (ref 0.44–1.00)
GFR, Estimated: 23 mL/min — ABNORMAL LOW (ref >60)
Glucose, Bld: 249 mg/dL — ABNORMAL HIGH (ref 70–99)
Potassium: 3.9 mmol/L (ref 3.5–5.1)
Sodium: 135 mmol/L (ref 135–145)

## 2021-12-17 LAB — I-STAT BETA HCG BLOOD, ED (MC, WL, AP ONLY): I-stat hCG, quantitative: <5 m[IU]/mL (ref <5)

## 2021-12-17 LAB — CBC WITH DIFFERENTIAL/PLATELET
Abs Immature Granulocytes: 0.08 10*3/uL — ABNORMAL HIGH (ref 0.00–0.07)
Basophils Absolute: 0.1 10*3/uL (ref 0.0–0.1)
Basophils Relative: 1 %
Eosinophils Absolute: 0.2 10*3/uL (ref 0.0–0.5)
Eosinophils Relative: 2 %
HCT: 34.3 % — ABNORMAL LOW (ref 36.0–46.0)
Hemoglobin: 11.3 g/dL — ABNORMAL LOW (ref 12.0–15.0)
Immature Granulocytes: 1 %
Lymphocytes Relative: 26 %
Lymphs Abs: 2.5 10*3/uL (ref 0.7–4.0)
MCH: 30.8 pg (ref 26.0–34.0)
MCHC: 32.9 g/dL (ref 30.0–36.0)
MCV: 93.5 fL (ref 80.0–100.0)
Monocytes Absolute: 0.6 10*3/uL (ref 0.1–1.0)
Monocytes Relative: 6 %
Neutro Abs: 6.3 10*3/uL (ref 1.7–7.7)
Neutrophils Relative %: 64 %
Platelets: 214 10*3/uL (ref 150–400)
RBC: 3.67 MIL/uL — ABNORMAL LOW (ref 3.87–5.11)
RDW: 13.7 % (ref 11.5–15.5)
WBC: 9.7 10*3/uL (ref 4.0–10.5)
nRBC: 0 % (ref 0.0–0.2)

## 2021-12-17 LAB — TROPONIN I (HIGH SENSITIVITY)
Troponin I (High Sensitivity): 14 ng/L (ref <18)
Troponin I (High Sensitivity): 12 ng/L (ref <18)

## 2021-12-17 MED ORDER — SODIUM CHLORIDE 0.9 % IV SOLN: INTRAVENOUS | Status: AC

## 2021-12-17 MED ORDER — IRBESARTAN 300 MG PO TABS
150.0000 mg | ORAL_TABLET | Freq: Every day | ORAL | Status: DC
  Administered 2021-12-18: 150 mg via ORAL
  Filled 2021-12-17 (×3): qty 1

## 2021-12-17 MED ORDER — SODIUM CHLORIDE 0.9% FLUSH
3.0000 mL | Freq: Two times a day (BID) | INTRAVENOUS | Status: DC
  Administered 2021-12-18 – 2021-12-21 (×7): 3 mL via INTRAVENOUS

## 2021-12-17 MED ORDER — CARVEDILOL 12.5 MG PO TABS
12.5000 mg | ORAL_TABLET | Freq: Two times a day (BID) | ORAL | Status: DC
Start: 2021-12-18 — End: 2021-12-19
  Administered 2021-12-18 (×2): 12.5 mg via ORAL
  Filled 2021-12-17 (×4): qty 1

## 2021-12-17 MED ORDER — HYDROCHLOROTHIAZIDE 25 MG PO TABS
25.0000 mg | ORAL_TABLET | Freq: Every day | ORAL | Status: DC
  Administered 2021-12-18 – 2021-12-21 (×4): 25 mg via ORAL
  Filled 2021-12-17 (×4): qty 1

## 2021-12-17 MED ORDER — GABAPENTIN 400 MG PO CAPS
400.0000 mg | ORAL_CAPSULE | Freq: Three times a day (TID) | ORAL | Status: DC
  Administered 2021-12-18 – 2021-12-21 (×10): 400 mg via ORAL
  Filled 2021-12-17 (×10): qty 1

## 2021-12-17 MED ORDER — HYDRALAZINE HCL 10 MG PO TABS
10.0000 mg | ORAL_TABLET | Freq: Two times a day (BID) | ORAL | Status: DC
  Administered 2021-12-18 – 2021-12-19 (×4): 10 mg via ORAL
  Filled 2021-12-17 (×4): qty 1

## 2021-12-17 MED ORDER — ATORVASTATIN CALCIUM 10 MG PO TABS
10.0000 mg | ORAL_TABLET | Freq: Every day | ORAL | Status: DC
  Administered 2021-12-18 – 2021-12-21 (×4): 10 mg via ORAL
  Filled 2021-12-17 (×4): qty 1

## 2021-12-17 MED ORDER — ACETAMINOPHEN 325 MG PO TABS: 650.0000 mg | ORAL_TABLET | Freq: Four times a day (QID) | ORAL | Status: DC | PRN

## 2021-12-17 MED ORDER — ENOXAPARIN SODIUM 30 MG/0.3ML IJ SOSY
30.0000 mg | PREFILLED_SYRINGE | Freq: Every day | INTRAMUSCULAR | Status: DC
  Filled 2021-12-17: qty 0.3

## 2021-12-17 MED ORDER — INSULIN GLARGINE-YFGN 100 UNIT/ML ~~LOC~~ SOLN
20.0000 [IU] | Freq: Every day | SUBCUTANEOUS | Status: DC
  Administered 2021-12-18 – 2021-12-21 (×4): 20 [IU] via SUBCUTANEOUS
  Filled 2021-12-17 (×5): qty 0.2

## 2021-12-17 MED ORDER — POLYETHYLENE GLYCOL 3350 17 G PO PACK: 17.0000 g | PACK | Freq: Every day | ORAL | Status: DC | PRN

## 2021-12-17 MED ORDER — ACETAMINOPHEN 500 MG PO TABS
1000.0000 mg | ORAL_TABLET | Freq: Once | ORAL | Status: DC
Start: 2021-12-17 — End: 2021-12-17

## 2021-12-17 MED ORDER — LABETALOL HCL 5 MG/ML IV SOLN
5.0000 mg | INTRAVENOUS | Status: DC | PRN
  Administered 2021-12-18: 5 mg via INTRAVENOUS
  Filled 2021-12-17: qty 4

## 2021-12-17 MED ORDER — MORPHINE SULFATE (PF) 2 MG/ML IV SOLN
2.0000 mg | INTRAVENOUS | Status: DC | PRN
  Administered 2021-12-18 – 2021-12-19 (×4): 2 mg via INTRAVENOUS
  Filled 2021-12-17 (×4): qty 1

## 2021-12-17 MED ORDER — ESCITALOPRAM OXALATE 20 MG PO TABS
20.0000 mg | ORAL_TABLET | Freq: Every day | ORAL | Status: DC
  Administered 2021-12-18 – 2021-12-21 (×4): 20 mg via ORAL
  Filled 2021-12-17 (×4): qty 1

## 2021-12-17 MED ORDER — OXYCODONE HCL 5 MG PO TABS
10.0000 mg | ORAL_TABLET | Freq: Four times a day (QID) | ORAL | Status: DC | PRN
  Administered 2021-12-18 – 2021-12-20 (×5): 10 mg via ORAL
  Filled 2021-12-17 (×5): qty 2

## 2021-12-17 MED ORDER — ACETAMINOPHEN 650 MG RE SUPP: 650.0000 mg | Freq: Four times a day (QID) | RECTAL | Status: DC | PRN

## 2021-12-17 MED ORDER — MORPHINE SULFATE (PF) 4 MG/ML IV SOLN
4.0000 mg | Freq: Once | INTRAVENOUS | Status: AC
  Administered 2021-12-17: 4 mg via INTRAVENOUS
  Filled 2021-12-17: qty 1

## 2021-12-17 MED ORDER — BUSPIRONE HCL 5 MG PO TABS
10.0000 mg | ORAL_TABLET | Freq: Three times a day (TID) | ORAL | Status: DC
  Administered 2021-12-18 – 2021-12-21 (×11): 10 mg via ORAL
  Filled 2021-12-17 (×5): qty 2
  Filled 2021-12-17: qty 1
  Filled 2021-12-17 (×5): qty 2

## 2021-12-17 MED ORDER — AMITRIPTYLINE HCL 25 MG PO TABS
25.0000 mg | ORAL_TABLET | Freq: Every day | ORAL | Status: DC
  Administered 2021-12-18 – 2021-12-20 (×3): 25 mg via ORAL
  Filled 2021-12-17 (×4): qty 1

## 2021-12-17 MED ORDER — ONDANSETRON HCL 4 MG/2ML IJ SOLN
4.0000 mg | Freq: Once | INTRAMUSCULAR | Status: AC
  Administered 2021-12-17: 4 mg via INTRAVENOUS
  Filled 2021-12-17: qty 2

## 2021-12-17 MED ORDER — INSULIN ASPART 100 UNIT/ML IJ SOLN
0.0000 [IU] | Freq: Three times a day (TID) | INTRAMUSCULAR | Status: DC
  Administered 2021-12-18: 5 [IU] via SUBCUTANEOUS
  Administered 2021-12-18 – 2021-12-19 (×3): 3 [IU] via SUBCUTANEOUS
  Administered 2021-12-19: 2 [IU] via SUBCUTANEOUS
  Administered 2021-12-20: 3 [IU] via SUBCUTANEOUS
  Administered 2021-12-20: 2 [IU] via SUBCUTANEOUS
  Administered 2021-12-21: 5 [IU] via SUBCUTANEOUS

## 2021-12-17 MED ORDER — SODIUM CHLORIDE 0.9 % IV BOLUS
500.0000 mL | Freq: Once | INTRAVENOUS | Status: AC
  Administered 2021-12-17: 500 mL via INTRAVENOUS

## 2021-12-17 MED ORDER — METOPROLOL TARTRATE 5 MG/5ML IV SOLN
5.0000 mg | Freq: Once | INTRAVENOUS | Status: AC
  Administered 2021-12-17: 5 mg via INTRAVENOUS
  Filled 2021-12-17: qty 5

## 2021-12-17 NOTE — Telephone Encounter (Signed)
  Chief Complaint: Heart pounding, stopping, SOB and then ok Symptoms: Heart pounding, stopping, SOB and then ok Frequency: 4-5 days off and on Pertinent Negatives: Patient denies Current SOB, chest pain Disposition: [x] ED /[] Urgent Care (no appt availability in office) / [] Appointment(In office/virtual)/ []  [facility]/ [] Home Care/ [] Refused Recommended Disposition /[] [facility]/ []  Follow-up with PCP Additional Notes: Pt states that over the past 4-5 days she has had several episodes of her heart pounding, followed by her heart stopping for a second or 2 , shortness of breath and then feeling fine. Reason for Disposition  [1] Chest pain (or "angina") comes and goes AND [2] is happening more often (increasing in frequency) or getting worse (increasing in severity) (Exception: chest pains that last only a few seconds)  Answer Assessment - Initial Assessment Questions 1. LOCATION: "Where does it hurt?"       chest 2. RADIATION: "Does the pain go anywhere else?" (e.g., into neck, jaw, arms, back)     no 3. ONSET: "When did the chest pain begin?" (Minutes, hours or days)      4-5 days ago off and on 4. PATTERN "Does the pain come and go, or has it been constant since it started?"  "Does it get worse with exertion?"       5. DURATION: "How long does it last" (e.g., seconds, minutes, hours)     minutes 6. SEVERITY: "How bad is the pain?"  (e.g., Scale 1-10; mild, moderate, or severe)    - MILD (1-3): doesn't interfere with normal activities     - MODERATE (4-7): interferes with normal activities or awakens from sleep    - SEVERE (8-10): excruciating pain, unable to do any normal activities       mild 7. CARDIAC RISK FACTORS: "Do you have any history of heart problems or risk factors for heart disease?" (e.g., angina, prior heart attack; diabetes, high blood pressure, high cholesterol, smoker, or strong family history of heart disease)      8. PULMONARY RISK FACTORS:  "Do you have any history of lung disease?"  (e.g., blood clots in lung, asthma, emphysema, birth control pills)     no 9. CAUSE: "What do you think is causing the chest pain?"     unsure 10. OTHER SYMPTOMS: "Do you have any other symptoms?" (e.g., dizziness, nausea, vomiting, sweating, fever, difficulty breathing, cough)       chills 11. PREGNANCY: "Is there any chance you are pregnant?" "When was your last menstrual period?"       na  Protocols used: Chest Pain-A-AH

## 2021-12-17 NOTE — ED Triage Notes (Signed)
Patient coming from home, complaint of on and off chest pain and shortness of breath for several days. States episodes are intermittent lasting 2-4 minutes.

## 2021-12-17 NOTE — Consult Note (Incomplete)
Reason for Consult/Chief Complaint:*** Consultant: ***, {Blank single:19197::"DO","PA","MD"}  Briana Williams is an 41 y.o. female.   HPI: ***  Past Medical History:  Diagnosis Date   Arthritis    GERD (gastroesophageal reflux disease)    Headache    migraine 2 weeks ago   Hyperlipidemia    Hypertension associated with diabetes ([place])    Renal disorder    has both kidneys, compacted and 'squashed together"   Scoliosis    Sleep apnea    in the midst of being tested for it.   waiting on scheduling    Type 2 diabetes mellitus treated with insulin ([place])    dx 2000    Past Surgical History:  Procedure Laterality Date   BACK SURGERY     CERVICAL FUSION     frontal closed sutures to forehead     Harrington Rod placement     HIP SURGERY     staph infection   LUMBAR FUSION     RADIOLOGY WITH ANESTHESIA N/A 02/25/2017   Procedure: RADIOLOGY WITH ANESTHESIA/MRI THORACIC SPINE AND LEFT SHOULDER WITHOUT CONTRAST.;  Surgeon: Radiologist, Medication, MD;  Location: [place] [facility];  Service: Radiology;  Laterality: N/A;    Family History  Problem Relation Age of Onset   Cancer Maternal Grandmother        mandibular cancer    Diabetes Mother    Heart failure Mother    CAD Mother        triple CABG   Diabetes Maternal Aunt    Stroke Father     Social History:  reports that she has never smoked. She has never used smokeless tobacco. She reports that she does not drink alcohol and does not use drugs.  Allergies:  Allergies  Allergen Reactions   Hydrocodone Itching    Note: benadryl does NOT help   Lisinopril Nausea And Vomiting    Caused nausea   Metformin And Related Other (See Comments)    Causes blisters on skin   Norvasc [Amlodipine Besylate] Swelling    Ankle swelling   Ultrasound Gel Rash and Other (See Comments)    Patient gets a red rash with use of ultrasound gel/ burns skin    Medications: I have reviewed the patient's current medications.  Results for orders  placed or performed during the hospital encounter of 12/17/21 (from the past 48 hour(s))  Basic metabolic panel     Status: Abnormal   Collection Time: 12/17/21  6:44 PM  Result Value Ref Range   Sodium 135 135 - 145 mmol/L   Potassium 3.9 3.5 - 5.1 mmol/L   Chloride 108 98 - 111 mmol/L   CO2 19 (L) 22 - 32 mmol/L   Glucose, Bld 249 (H) 70 - 99 mg/dL    Comment: Glucose reference range applies only to samples taken after fasting for at least 8 hours.   BUN 31 (H) 6 - 20 mg/dL   Creatinine, Ser 2.65 (H) 0.44 - 1.00 mg/dL   Calcium 8.5 (L) 8.9 - 10.3 mg/dL   GFR, Estimated 23 (L) >60 mL/min    Comment: (NOTE) Calculated using the CKD-EPI Creatinine Equation (2021)    Anion gap 8 5 - 15    Comment: Performed at [facility] [address] [place], [place] [address]  Troponin I (High Sensitivity)     Status: None   Collection Time: 12/17/21  6:44 PM  Result Value Ref Range   Troponin I (High Sensitivity) 14 <18 ng/L  Comment: (NOTE) Elevated high sensitivity troponin I (hsTnI) values and significant  changes across serial measurements may suggest ACS but many other  chronic and acute conditions are known to elevate hsTnI results.  Refer to the "Links" section for chest pain algorithms and additional  guidance. Performed at [facility], [place] [address] [place], [place] [address]   CBC with Differential     Status: Abnormal   Collection Time: 12/17/21  6:44 PM  Result Value Ref Range   WBC 9.7 4.0 - 10.5 K/uL   RBC 3.67 (L) 3.87 - 5.11 MIL/uL   Hemoglobin 11.3 (L) 12.0 - 15.0 g/dL   HCT 34.3 (L) 36.0 - 46.0 %   MCV 93.5 80.0 - 100.0 fL   MCH 30.8 26.0 - 34.0 pg   MCHC 32.9 30.0 - 36.0 g/dL   RDW 13.7 11.5 - 15.5 %   Platelets 214 150 - 400 K/uL   nRBC 0.0 0.0 - 0.2 %   Neutrophils Relative % 64 %   Neutro Abs 6.3 1.7 - 7.7 K/uL   Lymphocytes Relative 26 %   Lymphs Abs 2.5 0.7 - 4.0 K/uL   Monocytes Relative 6 %   Monocytes Absolute 0.6 0.1 - 1.0 K/uL    Eosinophils Relative 2 %   Eosinophils Absolute 0.2 0.0 - 0.5 K/uL   Basophils Relative 1 %   Basophils Absolute 0.1 0.0 - 0.1 K/uL   Immature Granulocytes 1 %   Abs Immature Granulocytes 0.08 (H) 0.00 - 0.07 K/uL    Comment: Performed at [facility] [address] [place], [place] [address]  I-Stat beta hCG blood, ED     Status: None   Collection Time: 12/17/21  6:58 PM  Result Value Ref Range   I-stat hCG, quantitative <5.0 <5 mIU/mL   Comment 3            Comment:   GEST. AGE      CONC.  (mIU/mL)   <=1 WEEK        5 - 50     2 WEEKS       50 - 500     3 WEEKS       100 - 10,000     4 WEEKS     1,000 - 30,000        FEMALE AND NON-PREGNANT FEMALE:     LESS THAN 5 mIU/mL   Troponin I (High Sensitivity)     Status: None   Collection Time: 12/17/21  8:23 PM  Result Value Ref Range   Troponin I (High Sensitivity) 12 <18 ng/L    Comment: (NOTE) Elevated high sensitivity troponin I (hsTnI) values and significant  changes across serial measurements may suggest ACS but many other  chronic and acute conditions are known to elevate hsTnI results.  Refer to the "Links" section for chest pain algorithms and additional  guidance. Performed at [facility], [place] [address] [place],  [address]   Urinalysis, Routine w reflex microscopic     Status: Abnormal   Collection Time: 12/17/21  9:05 PM  Result Value Ref Range   Color, Urine STRAW (A) YELLOW   APPearance CLEAR CLEAR   Specific Gravity, Urine 1.012 1.005 - 1.030   pH 6.0 5.0 - 8.0   Glucose, UA >=500 (A) NEGATIVE mg/dL   Hgb urine dipstick SMALL (A) NEGATIVE   Bilirubin Urine NEGATIVE NEGATIVE   Ketones, ur NEGATIVE NEGATIVE mg/dL   Protein, ur >=300 (  A) NEGATIVE mg/dL   Nitrite NEGATIVE NEGATIVE   Leukocytes,Ua NEGATIVE NEGATIVE   RBC / HPF 0-5 0 - 5 RBC/hpf   WBC, UA 0-5 0 - 5 WBC/hpf   Bacteria, UA RARE (A) NONE SEEN   Squamous Epithelial / LPF 0-5 0 - 5    Comment: Performed at [facility], [place] [address] [place], [place] [address]    DG Chest 2 View  Result Date: 12/17/2021 CLINICAL DATA:  Chest pain short of breath EXAM: CHEST - 2 VIEW COMPARISON:  05/10/2008 FINDINGS: Hardware in the cervicothoracic spine. Retained metallic rods at the thoracolumbar spine as before. No acute airspace disease, pleural effusion, or pneumothorax. Stable cardiomediastinal silhouette. IMPRESSION: No active cardiopulmonary disease. Electronically Signed   By: Donavan Foil M.D.   On: 12/17/2021 19:16   CT Renal Stone Study  Result Date: 12/17/2021 CLINICAL DATA:  Right abdominal pain EXAM: CT ABDOMEN AND PELVIS WITHOUT CONTRAST TECHNIQUE: Multidetector CT imaging of the abdomen and pelvis was performed following the standard protocol without IV contrast. RADIATION DOSE REDUCTION: This exam was performed according to the departmental dose-optimization program which includes automated exposure control, adjustment of the mA and/or kV according to patient size and/or use of iterative reconstruction technique. COMPARISON:  None Available. FINDINGS: Lower chest: No acute abnormality. Hepatobiliary: Cholelithiasis without pericholecystic inflammatory change identified. No intra or extrahepatic biliary ductal dilation. No definite intrahepatic mass on this noncontrast examination. Pancreas: Unremarkable Spleen: Unremarkable Adrenals/Urinary Tract: The right adrenal gland is unremarkable. Stable 2.6 cm macroscopic fat containing mass within the left adrenal gland in keeping with a benign adrenal myelolipoma. The kidneys are normal in size and position. Vascular calcifications are seen within the renal hila bilaterally. Stable 2 mm nonobstructing calculi noted within the lower pole of the right kidney. No ureteral calculi. No hydronephrosis. No perinephric inflammatory stranding or fluid collections are seen. The bladder is unremarkable. Stomach/Bowel: Mild scattered descending and sigmoid colonic diverticulosis. The  stomach, small bowel, and large bowel are otherwise unremarkable. Appendix normal. No free intraperitoneal gas or fluid. Vascular/Lymphatic: Aortic atherosclerosis. No enlarged abdominal or pelvic lymph nodes. Reproductive: Fullness of the ovaries bilaterally is unchanged from prior examination better assessed on prior MRI examination of 11/06/2017. This is unchanged. The pelvic organs are otherwise unremarkable. Other: No abdominal wall hernia. 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 No acute bone abnormality. IMPRESSION: No acute intra-abdominal pathology identified. No definite radiographic explanation for the patient's reported symptoms. Cholelithiasis. 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 Minimal right nonobstructing nephrolithiasis, stable. Mild distal colonic diverticulosis without superimposed acute inflammatory change. Extensive lumbar fusion, unchanged. Aortic Atherosclerosis (ICD10-I70.0). Electronically Signed   By: Fidela Salisbury M.D.   On: 12/17/2021 22:32    ROS 10 point review of systems is negative except as listed above in HPI.   Physical Exam Blood pressure (!) 225/85, pulse 84, temperature 98.2 F (36.8 C), resp. rate 17, SpO2 99 %. Constitutional: well-developed, well-nourished*** HEENT: pupils equal, round, reactive to light, 2***mm b/l, moist conjunctiva, external inspection of ears and nose normal, hearing {Blank single:19197::"intact","diminished","unable to be assessed"} Oropharynx: normal oropharyngeal mucosa, {Blank single:19197::"normal","poor"} dentition Neck: no thyromegaly, trachea midline, {Blank single:19197::"+","no","unable to assess"} midline cervical tenderness to palpation Chest: breath sounds equal bilaterally, {Blank single:19197::"normal","absent","labored"} respiratory effort, {Blank single:19197::"+","no"}  midline or lateral chest wall tenderness to palpation/deformity Abdomen: soft, NT, no bruising, no hepatosplenomegaly GU: {Blank  single:19197::"no blood at urethral meatus of penis, no scrotal masses or abnormality","normal female genitalia"}  Back: no wounds, {Blank single:19197::"+","no","unable to assess"} thoracic/lumbar spine tenderness to palpation, {Blank single:19197::"+","no"} thoracic/lumbar spine stepoffs Rectal: {Blank single:19197::"good tone, no blood","deferred"} Extremities: 2+ radial and pedal pulses bilaterally, {Blank single:19197::"intact","unable to assess"} motor and sensation bilateral UE and LE, {Blank single:19197::"+","no"} peripheral edema MSK: {Blank single:19197::"normal","abnormal","unable to assess"} gait/station, no clubbing/cyanosis of fingers/toes, {Blank single:19197::"normal","limited","unable to assess"} ROM of all four extremities Skin: warm, dry, no rashes Psych: {Blank single:19197::"normal memory, normal mood/affect","unable to assess"}     Assessment/Plan: ***   Jesusita Oka, MD General and [facility]

## 2021-12-17 NOTE — ED Provider Triage Note (Signed)
Emergency Medicine Provider Triage Evaluation Note  Briana Williams , a 41 y.o. female  was evaluated in triage.  Pt complains of palpitations, chest pain.  Has been noticing symptoms randomly over the past 4 days.  No specific aggravating or alleviating factor.  Does report some shortness of breath with these episodes.  Denies any vomiting but does endorse diarrhea for the past few days.  Has been having a "head cold" over the past week.  Review of Systems  Positive: Chest pain, palpitations, shortness of breath Negative: Vomiting  Physical Exam  There were no vitals taken for this visit. Gen:   Awake, no distress   Resp:  Normal effort  MSK:   Moves extremities without difficulty  Other:  Lungs are clear bilaterally  Medical Decision Making  Medically screening exam initiated at 6:22 PM.  Appropriate orders placed.  Briana Williams was informed that the remainder of the evaluation will be completed by another provider, this initial triage assessment does not replace that evaluation, and the importance of remaining in the ED until their evaluation is complete.  Labs and EKG ordered   Delia Heady, [place] 12/17/21 1824

## 2021-12-17 NOTE — ED Provider Notes (Signed)
[facility] EMERGENCY DEPARTMENT Provider Note   CSN: [id number] Arrival date & time: 12/17/21  1719     History  Chief Complaint  Patient presents with   Chest Pain   Shortness of Breath    Briana Williams is a 41 y.o. female.  Patient presents with multiple different symptoms over the past 5 days.  Patient had intermittent cough and had cold symptoms and palpitations.  Patient had intermittent chest tightness nonradiating.  No heart failure, blood clot or cardiac history.  Patient has a history of high blood pressure has not been compliant with medications.  Patient is also had intermittent right flank abdominal pain without urinary symptoms.  Patient had multiple surgeries for scoliosis last one approximately 4 years ago and cervical fusion.      Home Medications Prior to Admission medications   Medication Sig Start Date End Date Taking? Authorizing Provider  acetaminophen (TYLENOL) 650 MG CR tablet Take 650 mg by mouth every 8 (eight) hours as needed for pain.   Yes [provider]  albuterol (PROVENTIL HFA;VENTOLIN HFA) 108 (90 Base) MCG/ACT inhaler Inhale 1 puff into the lungs every 6 (six) hours as needed for wheezing or shortness of breath. 10/04/18  Yes [name], MD  amitriptyline (ELAVIL) 25 MG tablet TAKE 1 TABLET(25 MG) BY MOUTH AT BEDTIME 10/29/21  Yes Ladell Pier, MD  atorvastatin (LIPITOR) 10 MG tablet Take 1 tablet (10 mg total) by mouth daily. 09/15/18  Yes Ladell Pier, MD  busPIRone (BUSPAR) 10 MG tablet TAKE 1 TABLET(10 MG) BY MOUTH THREE TIMES DAILY 11/24/20  Yes Ladell Pier, MD  carvedilol (COREG) 12.5 MG tablet Take 1 tablet (12.5 mg total) by mouth 2 (two) times daily with a meal. 09/06/20  Yes Ladell Pier, MD  D3-50 1.25 MG (50000 UT) capsule Take 50,000 Units by mouth once a week. 09/03/21  Yes [provider]  escitalopram (LEXAPRO) 20 MG tablet TAKE 1 TABLET(20 MG) BY MOUTH DAILY 11/12/20   Yes Ladell Pier, MD  ferrous sulfate 325 (65 FE) MG tablet Take 1 tablet (325 mg total) by mouth daily with breakfast. 07/05/21  Yes Ladell Pier, MD  gabapentin (NEURONTIN) 400 MG capsule Take 400 mg by mouth 3 (three) times daily.   Yes [provider]  hydrALAZINE (APRESOLINE) 10 MG tablet Take 1 tablet (10 mg total) by mouth 2 (two) times daily. 07/14/21  Yes Ladell Pier, MD  hydrochlorothiazide (HYDRODIURIL) 25 MG tablet Take 1 tablet (25 mg total) by mouth daily. 06/11/19  Yes Ladell Pier, MD  Insulin Glargine (LANTUS SOLOSTAR) 100 UNIT/ML Solostar Pen Inject 38 Units into the skin daily. 09/15/18  Yes Ladell Pier, MD  NOVOLOG FLEXPEN 100 UNIT/ML FlexPen INJECT 15 UNITS INTO THE SKIN WITH THE TWO LARGEST MEALS OF THE DAY. 11/27/18  Yes Ladell Pier, MD  olmesartan (BENICAR) 20 MG tablet Take 20 mg by mouth daily. 09/26/21  Yes [provider]  Oxycodone HCl 10 MG TABS Take 10 mg by mouth 5 (five) times daily. 06/10/21  Yes [provider]  tiZANidine (ZANAFLEX) 4 MG tablet TAKE 1 TABLET BY MOUTH EVERY 8 HOURS AS NEEDED Patient taking differently: Take 4 mg by mouth every 8 (eight) hours as needed for muscle spasms. 02/21/20  Yes Ladell Pier, MD  Vitamin D, Ergocalciferol, (DRISDOL) 1.25 MG (50000 UNIT) CAPS capsule Take 1 capsule (50,000 Units total) by mouth every 7 (seven) days. 02/28/21  Yes Ladell Pier, MD  ACCU-CHEK SOFTCLIX LANCETS lancets 1 each by Other route 3 (three) times daily. ICD 10 E11.9 09/21/15   [name], MD  Accu-Chek Softclix Lancets lancets Use as instructed 01/02/21   Ladell Pier, MD  Blood Glucose Monitoring Suppl (ACCU-CHEK AVIVA PLUS) w/Device KIT 1 Device by Does not apply route 3 (three) times daily after meals. ICD 10 E11.9 09/21/15   [name], MD  Blood Glucose Monitoring Suppl (ACCU-CHEK GUIDE) w/Device KIT Use as directed 01/02/21   Ladell Pier, MD  Erenumab-aooe  (AIMOVIG) 70 MG/ML SOAJ Administer 1 mL under the skin every 30 days. Must be seen for further refills. Patient not taking: Reported on 12/17/2021 12/09/19   Marcial Pacas, MD  glucose blood (ACCU-CHEK AVIVA PLUS) test strip 1 each by Other route 3 (three) times daily. ICD 10 E11.9 09/21/15   [name], MD  glucose blood (ACCU-CHEK GUIDE) test strip Test 3x/day before meals 01/02/21   Ladell Pier, MD  Insulin Pen Needle (B-D ULTRAFINE III SHORT PEN) 31G X 8 MM MISC 1 application by Does not apply route daily. 01/02/17   [name], MD  Lancet Devices [facility]) lancets 1 each by Other route 3 (three) times daily. ICD 10 E11.9 09/21/15   [name], MD  methocarbamol (ROBAXIN) 750 MG tablet Take 1 tablet (750 mg total) by mouth 3 (three) times daily as needed (muscle spasm/pain). Patient not taking: Reported on 12/17/2021 12/09/20   Lajean Saver, MD  norethindrone (MICRONOR) 0.35 MG tablet Take 1 tablet (0.35 mg total) by mouth daily. Patient not taking: Reported on 12/17/2021 04/09/21   Megan Salon, MD  ondansetron (ZOFRAN ODT) 4 MG disintegrating tablet Take 1 tablet (4 mg total) by mouth every 8 (eight) hours as needed. Patient not taking: Reported on 12/17/2021 03/11/19   Marcial Pacas, MD  pregabalin (LYRICA) 25 MG capsule Take 1 capsule (25 mg total) by mouth 2 (two) times daily. Patient not taking: Reported on 12/17/2021 06/27/21   Ladell Pier, MD  sitaGLIPtin (JANUVIA) 100 MG tablet TAKE 1 TABLET (100 MG TOTAL) BY MOUTH DAILY. 08/11/20 08/11/21  Ladell Pier, MD  SUMAtriptan (IMITREX) 100 MG tablet Take 1 tablet (100 mg total) by mouth once as needed for up to 1 dose for migraine. May repeat in 2 hours if headache persists or recurs. Patient not taking: Reported on 12/17/2021 03/11/19   Marcial Pacas, MD  triamcinolone cream (KENALOG) 0.1 % Apply 1 application topically 2 (two) times daily. Patient not taking: Reported on 12/17/2021 09/16/18   Ladell Pier, MD       Allergies    Hydrocodone, Lisinopril, Metformin and related, Norvasc [amlodipine besylate], and Ultrasound gel    Review of Systems   Review of Systems  Constitutional:  Negative for chills and fever.  HENT:  Positive for congestion.   Eyes:  Negative for visual disturbance.  Respiratory:  Positive for cough and shortness of breath.   Cardiovascular:  Positive for chest pain. Negative for leg swelling.  Gastrointestinal:  Positive for abdominal pain. Negative for vomiting.  Genitourinary:  Positive for flank pain. Negative for dysuria.  Musculoskeletal:  Negative for back pain, neck pain and neck stiffness.  Skin:  Negative for rash.  Neurological:  Negative for light-headedness and headaches.   Physical Exam Updated Vital Signs BP (!) 225/85   Pulse 84   Temp 98.2 F (36.8 C)   Resp 17   SpO2 99%  Physical Exam  Vitals and nursing note reviewed.  Constitutional:      General: She is not in acute distress.    Appearance: She is well-developed.  HENT:     Head: Normocephalic and atraumatic.     Comments: Decreased range of motion head neck fusion history    Mouth/Throat:     Mouth: Mucous membranes are moist.  Eyes:     General:        Right eye: No discharge.        Left eye: No discharge.     Conjunctiva/sclera: Conjunctivae normal.  Neck:     Trachea: No tracheal deviation.  Cardiovascular:     Rate and Rhythm: Normal rate and regular rhythm.     Heart sounds: No murmur heard. Pulmonary:     Effort: Pulmonary effort is normal.     Breath sounds: Normal breath sounds.  Abdominal:     General: There is no distension.     Palpations: Abdomen is soft.     Tenderness: There is abdominal tenderness (right mid abdomen). There is no guarding.  Musculoskeletal:     Cervical back: Neck supple. No rigidity.     Right lower leg: No edema.     Left lower leg: No edema.  Skin:    General: Skin is warm.     Capillary Refill: Capillary refill takes less than 2 seconds.      Findings: No rash.  Neurological:     General: No focal deficit present.     Mental Status: She is alert.     Cranial Nerves: No cranial nerve deficit.  Psychiatric:        Mood and Affect: Mood normal.    ED Results / Procedures / Treatments   Labs (all labs ordered are listed, but only abnormal results are displayed) Labs Reviewed  BASIC METABOLIC PANEL - Abnormal; Notable for the following components:      Result Value   CO2 19 (*)    Glucose, Bld 249 (*)    BUN 31 (*)    Creatinine, Ser 2.65 (*)    Calcium 8.5 (*)    GFR, Estimated 23 (*)    All other components within normal limits  CBC WITH DIFFERENTIAL/PLATELET - Abnormal; Notable for the following components:   RBC 3.67 (*)    Hemoglobin 11.3 (*)    HCT 34.3 (*)    Abs Immature Granulocytes 0.08 (*)    All other components within normal limits  URINALYSIS, ROUTINE W REFLEX MICROSCOPIC - Abnormal; Notable for the following components:   Color, Urine STRAW (*)    Glucose, UA >=500 (*)    Hgb urine dipstick SMALL (*)    Protein, ur >=300 (*)    Bacteria, UA RARE (*)    All other components within normal limits  HEPATIC FUNCTION PANEL  LIPASE, BLOOD  I-STAT BETA HCG BLOOD, ED ([facility], [facility], [facility] ONLY)  TROPONIN I (HIGH SENSITIVITY)  TROPONIN I (HIGH SENSITIVITY)    EKG EKG Interpretation  Date/Time:  Monday Dec 17 2021 18:27:50 EDT Ventricular Rate:  89 PR Interval:  166 QRS Duration: 78 QT Interval:  360 QTC Calculation: 438 R Axis:   106 Text Interpretation: Normal sinus rhythm Rightward axis Abnormal ECG When compared with ECG of 10-Feb-2017 14:42, PREVIOUS ECG IS PRESENT Confirmed by Elnora Morrison [id number]) on 12/17/2021 8:24:29 PM  Radiology DG Chest 2 View  Result Date: 12/17/2021 CLINICAL DATA:  Chest pain short of breath EXAM: CHEST - 2 VIEW COMPARISON:  05/10/2008 FINDINGS: Hardware in the cervicothoracic spine. Retained metallic rods at the thoracolumbar spine as before. No acute airspace disease,  pleural effusion, or pneumothorax. Stable cardiomediastinal silhouette. IMPRESSION: No active cardiopulmonary disease. Electronically Signed   By: Donavan Foil M.D.   On: 12/17/2021 19:16   CT Renal Stone Study  Result Date: 12/17/2021 CLINICAL DATA:  Right abdominal pain EXAM: CT ABDOMEN AND PELVIS WITHOUT CONTRAST TECHNIQUE: Multidetector CT imaging of the abdomen and pelvis was performed following the standard protocol without IV contrast. RADIATION DOSE REDUCTION: This exam was performed according to the departmental dose-optimization program which includes automated exposure control, adjustment of the mA and/or kV according to patient size and/or use of iterative reconstruction technique. COMPARISON:  None Available. FINDINGS: Lower chest: No acute abnormality. Hepatobiliary: Cholelithiasis without pericholecystic inflammatory change identified. No intra or extrahepatic biliary ductal dilation. No definite intrahepatic mass on this noncontrast examination. Pancreas: Unremarkable Spleen: Unremarkable Adrenals/Urinary Tract: The right adrenal gland is unremarkable. Stable 2.6 cm macroscopic fat containing mass within the left adrenal gland in keeping with a benign adrenal myelolipoma. The kidneys are normal in size and position. Vascular calcifications are seen within the renal hila bilaterally. Stable 2 mm nonobstructing calculi noted within the lower pole of the right kidney. No ureteral calculi. No hydronephrosis. No perinephric inflammatory stranding or fluid collections are seen. The bladder is unremarkable. Stomach/Bowel: Mild scattered descending and sigmoid colonic diverticulosis. The stomach, small bowel, and large bowel are otherwise unremarkable. Appendix normal. No free intraperitoneal gas or fluid. Vascular/Lymphatic: Aortic atherosclerosis. No enlarged abdominal or pelvic lymph nodes. Reproductive: Fullness of the ovaries bilaterally is unchanged from prior examination better assessed on prior  MRI examination of 11/06/2017. This is unchanged. The pelvic organs are otherwise unremarkable. Other: No abdominal wall hernia. 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 No acute bone abnormality. IMPRESSION: No acute intra-abdominal pathology identified. No definite radiographic explanation for the patient's reported symptoms. Cholelithiasis. 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 Minimal right nonobstructing nephrolithiasis, stable. Mild distal colonic diverticulosis without superimposed acute inflammatory change. Extensive lumbar fusion, unchanged. Aortic Atherosclerosis (ICD10-I70.0). Electronically Signed   By: Fidela Salisbury M.D.   On: 12/17/2021 22:32    Procedures Procedures    Medications Ordered in ED Medications  metoprolol tartrate (LOPRESSOR) injection 5 mg (has no administration in time range)  sodium chloride 0.9 % bolus 500 mL (has no administration in time range)  ondansetron (ZOFRAN) injection 4 mg (has no administration in time range)  morphine (PF) 4 MG/ML injection 4 mg (has no administration in time range)    ED Course/ Medical Decision Making/ A&P                           Medical Decision Making Amount and/or Complexity of Data Reviewed Labs: ordered. Radiology: ordered.  Risk OTC drugs.   Patient presents with primarily palpitations, intermittent chest pain and right abdominal pain.  Differential including reflux, musculoskeletal, ACS, gallbladder related, kidney stones, other.  Patient not having tearing or chest pain rating to the back to suggest dissection.  From a chest pain standpoint patient does have 3 risk factors of intermittent symptoms 4 to 5 days, troponins negative, EKG no acute ischemic findings.  Patient will need stress test as neck steps.  Patient's blood work reviewed creatinine 2.65 similar to  previous.  From the abdominal  pain standpoint this may be related to the chest pain or separate.  CT stone study performed as patient has kidney disease and showed multiple gallstones, ultrasound ordered for further delineation.  LFTs and lipase added and pending.  On recheck patient having nausea and persistent pain, general surgery consult and plan for medicine admission for chest pain, blood pressure management as patient's not able to tolerate oral fluids or blood pressure pills.  Discussed with general surgery who will be on consult.  Paged unassigned for admission.  Patient comfortable this plan.          Final Clinical Impression(s) / ED Diagnoses Final diagnoses:  History of scoliosis  Palpitations  Chest tightness  Primary hypertension  Chronic kidney disease, unspecified CKD stage    Rx / DC Orders ED Discharge Orders     None         Elnora Morrison, MD 12/17/21 2312

## 2021-12-17 NOTE — H&P (Signed)
History and Physical   Briana Williams NIO:[id number] DOB: 02/09/1981 DOA: 12/17/2021  PCP: Ladell Pier, MD   Patient coming from: Home  Chief Complaint: Multiple complaints including chest pain, shortness of breath, palpitations, abdominal pain  HPI: Briana Williams is a 41 y.o. female with medical history significant of diabetes, hypertension, scoliosis, insomnia, depression, chronic pain, hyperlipidemia, obesity, migraines, anemia presenting with multiple complaints as above.  Patient reports ongoing abdominal pain for the past several months.  She describes this as intermittent sometimes wavelike sharp stabbing pain at her right upper abdomen.    She also reports 5 days of intermittent palpitations and chest pain that is nonradiating.  In that time also experienced a cough and cold symptoms.  She describes the pain as stabbing intermittent pain in her epigastrium. She is also had some associated nausea vomiting and diarrhea decreased p.o. intake.  She denies fevers, chills, constipation.  ED Course: Vital signs in the ED significant for blood pressure in the 818E to 993Z systolic.  Lab work-up including BMP showed bicarb 19, BUN 31, creatinine elevated 2.65 which is similar to measurement 4 months ago but increased from 1.85 months ago and 1.4 a year ago.  Glucose 249, calcium 8.5.  LFTs pending.  CBC with hemoglobin stable at 11.3.  Troponin normal x2.  Lipase pending.  Urinalysis with hemoglobin protein and rare bacteria only.  Chest x-ray with no acute abnormality.  CT renal stone study showed no acute abnormality but did demonstrate gallstones, stable adrenal myolipoma, nonobstructing renal stones, diverticulosis, stable lumbar fusion.  Patient received morphine, metoprolol IV, Zofran, 500 cc of fluid in the ED.  General surgery was consulted considering abdominal pain and they will see the patient while admitted.  Review of Systems: As per HPI otherwise all other systems  reviewed and are negative.  Past Medical History:  Diagnosis Date   Arthritis    GERD (gastroesophageal reflux disease)    Headache    migraine 2 weeks ago   Hyperlipidemia    Hypertension associated with diabetes ([place])    Renal disorder    has both kidneys, compacted and 'squashed together"   Scoliosis    Sleep apnea    in the midst of being tested for it.   waiting on scheduling    Type 2 diabetes mellitus treated with insulin ([name])    dx 2000    Past Surgical History:  Procedure Laterality Date   BACK SURGERY     CERVICAL FUSION     frontal closed sutures to forehead     Harrington Rod placement     HIP SURGERY     staph infection   LUMBAR FUSION     RADIOLOGY WITH ANESTHESIA N/A 02/25/2017   Procedure: RADIOLOGY WITH ANESTHESIA/MRI THORACIC SPINE AND LEFT SHOULDER WITHOUT CONTRAST.;  Surgeon: Radiologist, Medication, MD;  Location: [facility];  Service: Radiology;  Laterality: N/A;    Social History  reports that she has never smoked. She has never used smokeless tobacco. She reports that she does not drink alcohol and does not use drugs.  Allergies  Allergen Reactions   Hydrocodone Itching    Note: benadryl does NOT help   Lisinopril Nausea And Vomiting    Caused nausea   Metformin And Related Other (See Comments)    Causes blisters on skin   Norvasc [Amlodipine Besylate] Swelling    Ankle swelling   Ultrasound Gel Rash and Other (See Comments)    Patient gets a red rash with  use of ultrasound gel/ burns skin    Family History  Problem Relation Age of Onset   Cancer Maternal Grandmother        mandibular cancer    Diabetes Mother    Heart failure Mother    CAD Mother        triple CABG   Diabetes Maternal Aunt    Stroke Father   Reviewed on admission  Prior to Admission medications   Medication Sig Start Date End Date Taking? Authorizing Provider  acetaminophen (TYLENOL) 650 MG CR tablet Take 650 mg by mouth every 8 (eight) hours as needed for pain.   Yes  [provider]  albuterol (PROVENTIL HFA;VENTOLIN HFA) 108 (90 Base) MCG/ACT inhaler Inhale 1 puff into the lungs every 6 (six) hours as needed for wheezing or shortness of breath. 10/04/18  Yes [name], MD  amitriptyline (ELAVIL) 25 MG tablet TAKE 1 TABLET(25 MG) BY MOUTH AT BEDTIME 10/29/21  Yes Ladell Pier, MD  atorvastatin (LIPITOR) 10 MG tablet Take 1 tablet (10 mg total) by mouth daily. 09/15/18  Yes Ladell Pier, MD  busPIRone (BUSPAR) 10 MG tablet TAKE 1 TABLET(10 MG) BY MOUTH THREE TIMES DAILY 11/24/20  Yes Ladell Pier, MD  carvedilol (COREG) 12.5 MG tablet Take 1 tablet (12.5 mg total) by mouth 2 (two) times daily with a meal. 09/06/20  Yes Ladell Pier, MD  D3-50 1.25 MG (50000 UT) capsule Take 50,000 Units by mouth once a week. 09/03/21  Yes [provider]  escitalopram (LEXAPRO) 20 MG tablet TAKE 1 TABLET(20 MG) BY MOUTH DAILY 11/12/20  Yes Ladell Pier, MD  ferrous sulfate 325 (65 FE) MG tablet Take 1 tablet (325 mg total) by mouth daily with breakfast. 07/05/21  Yes Ladell Pier, MD  gabapentin (NEURONTIN) 400 MG capsule Take 400 mg by mouth 3 (three) times daily.   Yes [provider]  hydrALAZINE (APRESOLINE) 10 MG tablet Take 1 tablet (10 mg total) by mouth 2 (two) times daily. 07/14/21  Yes Ladell Pier, MD  hydrochlorothiazide (HYDRODIURIL) 25 MG tablet Take 1 tablet (25 mg total) by mouth daily. 06/11/19  Yes Ladell Pier, MD  Insulin Glargine (LANTUS SOLOSTAR) 100 UNIT/ML Solostar Pen Inject 38 Units into the skin daily. 09/15/18  Yes Ladell Pier, MD  NOVOLOG FLEXPEN 100 UNIT/ML FlexPen INJECT 15 UNITS INTO THE SKIN WITH THE TWO LARGEST MEALS OF THE DAY. 11/27/18  Yes Ladell Pier, MD  olmesartan (BENICAR) 20 MG tablet Take 20 mg by mouth daily. 09/26/21  Yes [provider]  Oxycodone HCl 10 MG TABS Take 10 mg by mouth 5 (five) times daily. 06/10/21  Yes [provider]   tiZANidine (ZANAFLEX) 4 MG tablet TAKE 1 TABLET BY MOUTH EVERY 8 HOURS AS NEEDED Patient taking differently: Take 4 mg by mouth every 8 (eight) hours as needed for muscle spasms. 02/21/20  Yes Ladell Pier, MD  Vitamin D, Ergocalciferol, (DRISDOL) 1.25 MG (50000 UNIT) CAPS capsule Take 1 capsule (50,000 Units total) by mouth every 7 (seven) days. 02/28/21  Yes Ladell Pier, MD  ACCU-CHEK SOFTCLIX LANCETS lancets 1 each by Other route 3 (three) times daily. ICD 10 E11.9 09/21/15   [name], MD  Accu-Chek Softclix Lancets lancets Use as instructed 01/02/21   Ladell Pier, MD  Blood Glucose Monitoring Suppl (ACCU-CHEK AVIVA PLUS) w/Device KIT 1 Device by Does not apply route 3 (three) times daily after meals. ICD 10  E11.9 09/21/15   [name], MD  Blood Glucose Monitoring Suppl (ACCU-CHEK GUIDE) w/Device KIT Use as directed 01/02/21   Ladell Pier, MD  glucose blood (ACCU-CHEK AVIVA PLUS) test strip 1 each by Other route 3 (three) times daily. ICD 10 E11.9 09/21/15   [name], MD  glucose blood (ACCU-CHEK GUIDE) test strip Test 3x/day before meals 01/02/21   Ladell Pier, MD  Insulin Pen Needle (B-D ULTRAFINE III SHORT PEN) 31G X 8 MM MISC 1 application by Does not apply route daily. 01/02/17   [name], MD  Lancet Devices [facility]) lancets 1 each by Other route 3 (three) times daily. ICD 10 E11.9 09/21/15   Funches, Adriana Mccallum, MD  ondansetron (ZOFRAN ODT) 4 MG disintegrating tablet Take 1 tablet (4 mg total) by mouth every 8 (eight) hours as needed. Patient not taking: Reported on 12/17/2021 03/11/19   Marcial Pacas, MD  sitaGLIPtin (JANUVIA) 100 MG tablet TAKE 1 TABLET (100 MG TOTAL) BY MOUTH DAILY. 08/11/20 08/11/21  Ladell Pier, MD  SUMAtriptan (IMITREX) 100 MG tablet Take 1 tablet (100 mg total) by mouth once as needed for up to 1 dose for migraine. May repeat in 2 hours if headache persists or recurs. Patient not taking: Reported on 12/17/2021  03/11/19   Marcial Pacas, MD    Physical Exam: Vitals:   12/17/21 2215 12/17/21 2230 12/17/21 2245 12/17/21 2300  BP: (!) 165/109   (!) 225/85  Pulse: 88 78 84 84  Resp:      Temp:      SpO2: 100% 100% 100% 99%    Physical Exam Constitutional:      General: She is not in acute distress.    Appearance: Normal appearance. She is obese.  HENT:     Head: Normocephalic and atraumatic.     Mouth/Throat:     Mouth: Mucous membranes are moist.     Pharynx: Oropharynx is clear.  Eyes:     Extraocular Movements: Extraocular movements intact.     Pupils: Pupils are equal, round, and reactive to light.  Cardiovascular:     Rate and Rhythm: Normal rate and regular rhythm.     Pulses: Normal pulses.     Heart sounds: Normal heart sounds.  Pulmonary:     Effort: Pulmonary effort is normal. No respiratory distress.     Breath sounds: Normal breath sounds.  Abdominal:     General: Bowel sounds are normal. There is no distension.     Palpations: Abdomen is soft.     Tenderness: There is abdominal tenderness.  Musculoskeletal:        General: No swelling or deformity.  Skin:    General: Skin is warm and dry.  Neurological:     General: No focal deficit present.     Mental Status: Mental status is at baseline.   Labs on Admission: I have personally reviewed following labs and imaging studies  CBC: Recent Labs  Lab 12/17/21 1844  WBC 9.7  NEUTROABS 6.3  HGB 11.3*  HCT 34.3*  MCV 93.5  PLT 163    Basic Metabolic Panel: Recent Labs  Lab 12/17/21 1844  NA 135  K 3.9  CL 108  CO2 19*  GLUCOSE 249*  BUN 31*  CREATININE 2.65*  CALCIUM 8.5*    GFR: CrCl cannot be calculated (Unknown ideal weight.).  Liver Function Tests: No results for input(s): AST, ALT, ALKPHOS, BILITOT, PROT, ALBUMIN in the last 168 hours.  Urine analysis:  Component Value Date/Time   COLORURINE STRAW (A) 12/17/2021 2105   APPEARANCEUR CLEAR 12/17/2021 2105   LABSPEC 1.012 12/17/2021 2105    PHURINE 6.0 12/17/2021 2105   GLUCOSEU >=500 (A) 12/17/2021 2105   HGBUR SMALL (A) 12/17/2021 2105   HGBUR negative 10/04/2010 0947   BILIRUBINUR NEGATIVE 12/17/2021 2105   BILIRUBINUR small (A) 03/11/2019 1401   KETONESUR NEGATIVE 12/17/2021 2105   PROTEINUR >=300 (A) 12/17/2021 2105   UROBILINOGEN 0.2 03/11/2019 1401   UROBILINOGEN 0.2 01/12/2011 2009   NITRITE NEGATIVE 12/17/2021 2105   LEUKOCYTESUR NEGATIVE 12/17/2021 2105    Radiological Exams on Admission: DG Chest 2 View  Result Date: 12/17/2021 CLINICAL DATA:  Chest pain short of breath EXAM: CHEST - 2 VIEW COMPARISON:  05/10/2008 FINDINGS: Hardware in the cervicothoracic spine. Retained metallic rods at the thoracolumbar spine as before. No acute airspace disease, pleural effusion, or pneumothorax. Stable cardiomediastinal silhouette. IMPRESSION: No active cardiopulmonary disease. Electronically Signed   By: Donavan Foil M.D.   On: 12/17/2021 19:16   CT Renal Stone Study  Result Date: 12/17/2021 CLINICAL DATA:  Right abdominal pain EXAM: CT ABDOMEN AND PELVIS WITHOUT CONTRAST TECHNIQUE: Multidetector CT imaging of the abdomen and pelvis was performed following the standard protocol without IV contrast. RADIATION DOSE REDUCTION: This exam was performed according to the departmental dose-optimization program which includes automated exposure control, adjustment of the mA and/or kV according to patient size and/or use of iterative reconstruction technique. COMPARISON:  None Available. FINDINGS: Lower chest: No acute abnormality. Hepatobiliary: Cholelithiasis without pericholecystic inflammatory change identified. No intra or extrahepatic biliary ductal dilation. No definite intrahepatic mass on this noncontrast examination. Pancreas: Unremarkable Spleen: Unremarkable Adrenals/Urinary Tract: The right adrenal gland is unremarkable. Stable 2.6 cm macroscopic fat containing mass within the left adrenal gland in keeping with a benign adrenal  myelolipoma. The kidneys are normal in size and position. Vascular calcifications are seen within the renal hila bilaterally. Stable 2 mm nonobstructing calculi noted within the lower pole of the right kidney. No ureteral calculi. No hydronephrosis. No perinephric inflammatory stranding or fluid collections are seen. The bladder is unremarkable. Stomach/Bowel: Mild scattered descending and sigmoid colonic diverticulosis. The stomach, small bowel, and large bowel are otherwise unremarkable. Appendix normal. No free intraperitoneal gas or fluid. Vascular/Lymphatic: Aortic atherosclerosis. No enlarged abdominal or pelvic lymph nodes. Reproductive: Fullness of the ovaries bilaterally is unchanged from prior examination better assessed on prior MRI examination of 11/06/2017. This is unchanged. The pelvic organs are otherwise unremarkable. Other: No abdominal wall hernia. 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 No acute bone abnormality. IMPRESSION: No acute intra-abdominal pathology identified. No definite radiographic explanation for the patient's reported symptoms. Cholelithiasis. 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 Minimal right nonobstructing nephrolithiasis, stable. Mild distal colonic diverticulosis without superimposed acute inflammatory change. Extensive lumbar fusion, unchanged. Aortic Atherosclerosis (ICD10-I70.0). Electronically Signed   By: Fidela Salisbury M.D.   On: 12/17/2021 22:32    EKG: Independently reviewed.  Normal sinus rhythm at 89 bpm.  Assessment/Plan Principal Problem:   Hypertensive urgency Active Problems:   Diabetes ([place])   HYPERTRIGLYCERIDEMIA   Depression   Essential hypertension, benign   Idiopathic scoliosis and kyphoscoliosis   Insomnia   Chronic pain syndrome   Morbid obesity ([facility])   Migraines   Anemia in chronic  kidney disease   Hypertensive urgency Chest pain > Patient presenting with significant hypertension  in the 297L to 892J systolic.  Reporting intermittent chest pains shortness of breath palpitations for the last several days.  Possibly representing hypertensive urgency.  Also unclear how her hypertension may be affecting her kidneys. > Received dose of IV metoprolol in the ED.  Concern in the ED for patient inability to tolerate p.o. medications due to nausea and vomiting. > Troponin normal x2. -  Monitor on telemetry - As needed IV labetalol for systolic greater than 194 - When tolerating p.o., tomorrow, resume carvedilol, hydralazine, hydrochlorothiazide, replace olmesartan with formulary irbesartan  AKI versus progressive CKD > Patient noted creatinine elevated to 2.65 this is similar to 4 months ago however it is up from 1.85 months ago and up from 1.4-year ago. > Suspicious for AKI versus progression of CKD. > Received 500 cc of fluids in the ED, will continue IV fluids overnight - Continue with IV fluids overnight - Trend renal function and electrolytes - Avoid nephrotoxic agents  Abdominal pain Nausea vomiting > Patient has ongoing intermittent abdominal pain somewhat colicky in nature. > On CT noted to have gallstones and renal stones none that are obstructing. > Case discussed with general surgery by EDP, general surgery will see the patient in consultation recommending formal ultrasound to evaluate further for gallstones.  There is a possibility had a gallstone that passed through to the small intestine.  Checking LFTs to evaluate further. - Appreciate general surgery recommendations - Follow-up lipase and LFTs - Follow-up ultrasound - As needed pain medication with home oxycodone and then morphine for severe pain.  Diabetes > On 38 units daily at home - 20 units daily - SSI  Insomnia Depression - When tolerating p.o., resume amitriptyline, BuSpar, Lexapro  Chronic  pain - Morphine as needed as above for acute and chronic pain - Continue home oxycodone - Continue home gabapentin  Hyperlipidemia - Continue home atorvastatin  Anemia > Hemoglobin stable at 11.3 - Trend CBC  DVT prophylaxis: Lovenox Code Status:   Full Family Communication:  Updated at bedside  Disposition Plan:   Patient is from:  Home  Anticipated DC to:  Home  Anticipated DC date:  1 to 3 days  Anticipated DC barriers: None  Consults called:  General surgery, consulted by EDP, will see the patient Admission status:  Observation, telemetry  Severity of Illness: The appropriate patient status for this patient is OBSERVATION. Observation status is judged to be reasonable and necessary in order to provide the required intensity of service to ensure the patient's safety. The patient's presenting symptoms, physical exam findings, and initial radiographic and laboratory data in the context of their medical condition is felt to place them at decreased risk for further clinical deterioration. Furthermore, it is anticipated that the patient will be medically stable for discharge from the hospital within 2 midnights of admission.    Marcelyn Bruins MD [facility]  How to contact the [facility] Attending or Consulting provider Rodriguez Hevia or covering provider during after hours [place], for this patient?   Check the care team in [facility] and look for a) attending/consulting [facility] provider listed and b) the [facility] team listed Log into [email or web address] and use [place]'s universal password to access. If you do not have the password, please contact the hospital operator. Locate the [facility] provider you are looking for under [facility] and page to a number that you can be directly reached. If you still have difficulty reaching the provider, please page the [facility] (Director on Call) for  the Hospitalists listed on amion for assistance.  12/17/2021, 11:27 PM

## 2021-12-17 NOTE — Discharge Instructions (Addendum)
Briana Williams  You were in the hospital with a few things: Abdominal pain: this may be related to your gallbladder. You had this removed while admitted. Please continue your pain medication and follow-up with the surgeon as directed Kidney impairment: this may have been related to your low blood pressure issue. This will take time to correct. Please follow-up with the kidney doctor for repeat lab-work. I have adjusted your doses of gabapentin and Januvia to account for your decreased kidney function High/low blood pressure: your blood pressure medications have been adjusted to account for your kidney injury, in addition to your low blood pressure episode. Please check your blood pressure daily and discuss with your primary care about the need to resume all of your blood pressure medications  Sincerely, Cordelia Poche, MD    [facility]  Please arrive at least 30 min before your appointment to complete your check in paperwork.  If you are unable to arrive 30 min prior to your appointment time we may have to cancel or reschedule you. LAPAROSCOPIC SURGERY: POST OP INSTRUCTIONS Always review your discharge instruction sheet given to you by the facility where your surgery was performed. IF YOU HAVE DISABILITY OR FAMILY LEAVE FORMS, YOU MUST BRING THEM TO THE OFFICE FOR PROCESSING.   DO NOT GIVE THEM TO YOUR DOCTOR.  PAIN CONTROL  First take acetaminophen (Tylenol) to control your pain after surgery.  Follow directions on package.  Taking acetaminophen (Tylenol) and/or ibuprofen (Advil) regularly after surgery will help to control your pain and lower the amount of prescription pain medication you may need.  You should not take more than 4,000 mg (4 grams) of acetaminophen (Tylenol) in 24 hours.  You should not take ibuprofen (Advil), aleve, motrin, naprosyn or other NSAIDS if you have a history of stomach ulcers or chronic kidney disease.  A prescription for pain  medication may be given to you upon discharge.  Take your pain medication as prescribed, if you still have uncontrolled pain after taking acetaminophen (Tylenol) or ibuprofen (Advil). Use ice packs to help control pain. If you need a refill on your pain medication, please contact your pharmacy.  They will contact our office to request authorization. Prescriptions will not be filled after 5pm or on week-ends.  HOME MEDICATIONS Take your usually prescribed medications unless otherwise directed.  DIET You should follow a light diet the first few days after arrival home.  Be sure to include lots of fluids daily. Avoid fatty, fried foods.   CONSTIPATION It is common to experience some constipation after surgery and if you are taking pain medication.  Increasing fluid intake and taking a stool softener (such as Colace) will usually help or prevent this problem from occurring.  A mild laxative (Milk of Magnesia or Miralax) should be taken according to package instructions if there are no bowel movements after 48 hours.  WOUND/INCISION CARE Most patients will experience some swelling and bruising in the area of the incisions.  Ice packs will help.  Swelling and bruising can take several days to resolve.  Unless discharge instructions indicate otherwise, follow guidelines below  STERI-STRIPS - you may remove your outer bandages 48 hours after surgery, and you may shower at that time.  You have steri-strips (small skin tapes) in place directly over the incision.  These strips should be left on the skin for 7-10 days.   DERMABOND/SKIN GLUE - you may shower in 24 hours.  The glue will flake off over the  next 2-3 weeks. Any sutures or staples will be removed at the office during your follow-up visit.  ACTIVITIES You may resume regular (light) daily activities beginning the next day--such as daily self-care, walking, climbing stairs--gradually increasing activities as tolerated.  You may have sexual  intercourse when it is comfortable.  Refrain from any heavy lifting or straining until approved by your doctor. You may drive when you are no longer taking prescription pain medication, you can comfortably wear a seatbelt, and you can safely maneuver your car and apply brakes.  FOLLOW-UP You should see your doctor in the office for a follow-up appointment approximately 2-3 weeks after your surgery.  You should have been given your post-op/follow-up appointment when your surgery was scheduled.  If you did not receive a post-op/follow-up appointment, make sure that you call for this appointment within a day or two after you arrive home to insure a convenient appointment time.  OTHER INSTRUCTIONS  WHEN TO CALL YOUR DOCTOR: Fever over 101.0 Inability to urinate Continued bleeding from incision. Increased pain, redness, or drainage from the incision. Increasing abdominal pain  The clinic staff is available to answer your questions during regular business hours.  Please don't hesitate to call and ask to speak to one of the nurses for clinical concerns.  If you have a medical emergency, go to the nearest emergency room or call 911.  A surgeon from [facility] is always on call at the hospital. [address], [place], [place], [place]  [address] ? [address], [place], [place]   [id number] [phone] ? [phone] ? FAX [phone]

## 2021-12-18 ENCOUNTER — Observation Stay (HOSPITAL_COMMUNITY): Payer: Medicaid Other

## 2021-12-18 DIAGNOSIS — M4326 Fusion of spine, lumbar region: Secondary | ICD-10-CM | POA: Diagnosis not present

## 2021-12-18 DIAGNOSIS — N200 Calculus of kidney: Secondary | ICD-10-CM | POA: Diagnosis not present

## 2021-12-18 DIAGNOSIS — I10 Essential (primary) hypertension: Secondary | ICD-10-CM | POA: Diagnosis not present

## 2021-12-18 DIAGNOSIS — N1831 Chronic kidney disease, stage 3a: Secondary | ICD-10-CM

## 2021-12-18 DIAGNOSIS — I160 Hypertensive urgency: Secondary | ICD-10-CM | POA: Diagnosis not present

## 2021-12-18 DIAGNOSIS — G894 Chronic pain syndrome: Secondary | ICD-10-CM | POA: Diagnosis not present

## 2021-12-18 DIAGNOSIS — R0602 Shortness of breath: Secondary | ICD-10-CM | POA: Diagnosis not present

## 2021-12-18 DIAGNOSIS — K5730 Diverticulosis of large intestine without perforation or abscess without bleeding: Secondary | ICD-10-CM | POA: Diagnosis not present

## 2021-12-18 DIAGNOSIS — R0789 Other chest pain: Secondary | ICD-10-CM | POA: Diagnosis not present

## 2021-12-18 DIAGNOSIS — K828 Other specified diseases of gallbladder: Secondary | ICD-10-CM | POA: Diagnosis not present

## 2021-12-18 DIAGNOSIS — K219 Gastro-esophageal reflux disease without esophagitis: Secondary | ICD-10-CM | POA: Diagnosis not present

## 2021-12-18 DIAGNOSIS — Z981 Arthrodesis status: Secondary | ICD-10-CM | POA: Diagnosis not present

## 2021-12-18 DIAGNOSIS — E781 Pure hyperglyceridemia: Secondary | ICD-10-CM | POA: Diagnosis not present

## 2021-12-18 DIAGNOSIS — K8020 Calculus of gallbladder without cholecystitis without obstruction: Secondary | ICD-10-CM | POA: Diagnosis not present

## 2021-12-18 DIAGNOSIS — E785 Hyperlipidemia, unspecified: Secondary | ICD-10-CM | POA: Diagnosis not present

## 2021-12-18 DIAGNOSIS — I152 Hypertension secondary to endocrine disorders: Secondary | ICD-10-CM | POA: Diagnosis not present

## 2021-12-18 DIAGNOSIS — Z79899 Other long term (current) drug therapy: Secondary | ICD-10-CM | POA: Diagnosis not present

## 2021-12-18 DIAGNOSIS — F32A Depression, unspecified: Secondary | ICD-10-CM | POA: Diagnosis not present

## 2021-12-18 DIAGNOSIS — E1122 Type 2 diabetes mellitus with diabetic chronic kidney disease: Secondary | ICD-10-CM | POA: Diagnosis not present

## 2021-12-18 DIAGNOSIS — Z79891 Long term (current) use of opiate analgesic: Secondary | ICD-10-CM | POA: Diagnosis not present

## 2021-12-18 DIAGNOSIS — R1010 Upper abdominal pain, unspecified: Secondary | ICD-10-CM | POA: Diagnosis not present

## 2021-12-18 DIAGNOSIS — D631 Anemia in chronic kidney disease: Secondary | ICD-10-CM | POA: Diagnosis not present

## 2021-12-18 DIAGNOSIS — M1990 Unspecified osteoarthritis, unspecified site: Secondary | ICD-10-CM | POA: Diagnosis not present

## 2021-12-18 DIAGNOSIS — K8010 Calculus of gallbladder with chronic cholecystitis without obstruction: Secondary | ICD-10-CM | POA: Diagnosis not present

## 2021-12-18 DIAGNOSIS — R002 Palpitations: Secondary | ICD-10-CM | POA: Diagnosis not present

## 2021-12-18 DIAGNOSIS — G4700 Insomnia, unspecified: Secondary | ICD-10-CM | POA: Diagnosis not present

## 2021-12-18 DIAGNOSIS — Z6841 Body Mass Index (BMI) 40.0 and over, adult: Secondary | ICD-10-CM | POA: Diagnosis not present

## 2021-12-18 DIAGNOSIS — R079 Chest pain, unspecified: Secondary | ICD-10-CM | POA: Diagnosis not present

## 2021-12-18 DIAGNOSIS — N1832 Chronic kidney disease, stage 3b: Secondary | ICD-10-CM | POA: Diagnosis not present

## 2021-12-18 DIAGNOSIS — R1013 Epigastric pain: Secondary | ICD-10-CM | POA: Diagnosis not present

## 2021-12-18 DIAGNOSIS — I959 Hypotension, unspecified: Secondary | ICD-10-CM | POA: Diagnosis not present

## 2021-12-18 DIAGNOSIS — Z8739 Personal history of other diseases of the musculoskeletal system and connective tissue: Secondary | ICD-10-CM | POA: Diagnosis not present

## 2021-12-18 DIAGNOSIS — R1011 Right upper quadrant pain: Secondary | ICD-10-CM | POA: Diagnosis not present

## 2021-12-18 DIAGNOSIS — N179 Acute kidney failure, unspecified: Secondary | ICD-10-CM | POA: Diagnosis not present

## 2021-12-18 DIAGNOSIS — M4120 Other idiopathic scoliosis, site unspecified: Secondary | ICD-10-CM | POA: Diagnosis not present

## 2021-12-18 DIAGNOSIS — E1169 Type 2 diabetes mellitus with other specified complication: Secondary | ICD-10-CM | POA: Diagnosis not present

## 2021-12-18 DIAGNOSIS — K819 Cholecystitis, unspecified: Secondary | ICD-10-CM | POA: Diagnosis not present

## 2021-12-18 DIAGNOSIS — Z794 Long term (current) use of insulin: Secondary | ICD-10-CM | POA: Diagnosis not present

## 2021-12-18 LAB — COMPREHENSIVE METABOLIC PANEL
ALT: 27 U/L (ref 0–44)
AST: 23 U/L (ref 15–41)
Albumin: 3.3 g/dL — ABNORMAL LOW (ref 3.5–5.0)
Alkaline Phosphatase: 78 U/L (ref 38–126)
Anion gap: 7 (ref 5–15)
BUN: 26 mg/dL — ABNORMAL HIGH (ref 6–20)
CO2: 20 mmol/L — ABNORMAL LOW (ref 22–32)
Calcium: 8.9 mg/dL (ref 8.9–10.3)
Chloride: 113 mmol/L — ABNORMAL HIGH (ref 98–111)
Creatinine, Ser: 2.47 mg/dL — ABNORMAL HIGH (ref 0.44–1.00)
GFR, Estimated: 25 mL/min — ABNORMAL LOW (ref >60)
Glucose, Bld: 159 mg/dL — ABNORMAL HIGH (ref 70–99)
Potassium: 3.9 mmol/L (ref 3.5–5.1)
Sodium: 140 mmol/L (ref 135–145)
Total Bilirubin: 0.4 mg/dL (ref 0.3–1.2)
Total Protein: 7.6 g/dL (ref 6.5–8.1)

## 2021-12-18 LAB — CBC
HCT: 37.5 % (ref 36.0–46.0)
Hemoglobin: 12.7 g/dL (ref 12.0–15.0)
MCH: 31.3 pg (ref 26.0–34.0)
MCHC: 33.9 g/dL (ref 30.0–36.0)
MCV: 92.4 fL (ref 80.0–100.0)
Platelets: 256 10*3/uL (ref 150–400)
RBC: 4.06 MIL/uL (ref 3.87–5.11)
RDW: 13.6 % (ref 11.5–15.5)
WBC: 11.7 10*3/uL — ABNORMAL HIGH (ref 4.0–10.5)
nRBC: 0 % (ref 0.0–0.2)

## 2021-12-18 LAB — GLUCOSE, CAPILLARY
Glucose-Capillary: 223 mg/dL — ABNORMAL HIGH (ref 70–99)
Glucose-Capillary: 167 mg/dL — ABNORMAL HIGH (ref 70–99)
Glucose-Capillary: 198 mg/dL — ABNORMAL HIGH (ref 70–99)
Glucose-Capillary: 185 mg/dL — ABNORMAL HIGH (ref 70–99)

## 2021-12-18 LAB — HIV ANTIBODY (ROUTINE TESTING W REFLEX): HIV Screen 4th Generation wRfx: NONREACTIVE

## 2021-12-18 LAB — LIPASE, BLOOD: Lipase: 90 U/L — ABNORMAL HIGH (ref 11–51)

## 2021-12-18 MED ORDER — SODIUM CHLORIDE 0.9 % IV SOLN: INTRAVENOUS | Status: DC

## 2021-12-18 MED ORDER — TIZANIDINE HCL 4 MG PO TABS
4.0000 mg | ORAL_TABLET | Freq: Three times a day (TID) | ORAL | Status: DC | PRN
  Administered 2021-12-18 – 2021-12-20 (×4): 4 mg via ORAL
  Filled 2021-12-18 (×4): qty 1

## 2021-12-18 MED ORDER — ONDANSETRON HCL 4 MG/2ML IJ SOLN
4.0000 mg | Freq: Four times a day (QID) | INTRAMUSCULAR | Status: AC | PRN
  Administered 2021-12-18 (×2): 4 mg via INTRAVENOUS
  Filled 2021-12-18: qty 2

## 2021-12-18 MED ORDER — TECHNETIUM TC 99M MEBROFENIN IV KIT
5.2000 | PACK | Freq: Once | INTRAVENOUS | Status: AC | PRN
  Administered 2021-12-18: 5.2 via INTRAVENOUS

## 2021-12-18 NOTE — ED Notes (Signed)
Breakfast order placed ?

## 2021-12-18 NOTE — Progress Notes (Signed)
PROGRESS NOTE    Briana Williams  RDE:[id number] DOB: 02-26-1981 DOA: 12/17/2021 PCP: Ladell Pier, MD   Brief Narrative: Briana Williams is a 41 y.o. female with a history of diabetes, hypertension, scoliosis, insomnia, depression, chronic pain, hyperlipidemia, obesity, migraines, anemia who presented secondary to chest pain, palpitations, abdominal pain and shortness of breath. During admission she was also found to have a possible AKI, hypertensive urgency and nausea/vomiting. Concern for possible biliary etiology for nausea/vomiting. Hypertensive urgency secondary to nausea/vomiting and resultant medication nonadherence. General surgery consulted. HIDA scan significant for biliary dyskinesia.   Assessment and Plan:  Hypertensive urgency Primary hypertension Hypertensive urgency in setting of nausea/vomiting and inability to manage oral intake.  -Continue home Coreg, hydralazine, olmesartan (irbesartan while inpatient)  Chest pain Resolved.  Palpitations Patient appears to be having infrequent PVCs. -Monitor on telemetry -Check electrolytes  AKI on CKD stage IIIa Patient with AKI vs worsening CKD. Creatinine of 2.65 on admission and has trended down slightly. Most recent baseline appears to be between 1.8-2.55. -Continue IV fluids -BMP in AM  Abdominal pain Nausea and vomiting Concern for gallbladder pathology. Normal LFTs. Cholelithiasis confirmed on imaging and HIDA significant for decreased gallbladder ejection fracture consistent with biliary dyskinesia.  Diabetes mellitus, type 2 Patient is on insulin glargine 38 units daily and Novolog 15 units BID as an outpatient -Continue SSI while inpatient  Depression -Continue Elavil, Buspar and Lexapro  Chronic pain -Continue oxycodone PRN while able to take PO -Continue morphine PRN while unable to take PO  Hyperlipidemia -Continue Lipitor  Anemia Recent iron panel was unremarkable for etiology.  Anemia is extremely mild. Patient has a history of irregular menstrual cycle.  DVT prophylaxis: Lovenox Code Status:   Code Status: Full Code Family Communication: None at bedside Disposition Plan: Discharge home pending general surgery recommendations/management   Consultants:  General surgery  Procedures:  None  Antimicrobials: None    Subjective: Patient reports some persistent RLQ abdominal pain. Emesis last night but none so far today.  Objective: BP (!) 178/98   Pulse 72   Temp 97.7 F (36.5 C)   Resp 19   SpO2 100%   Examination:  General exam: Appears calm and comfortable Respiratory system: Clear to auscultation. Respiratory effort normal. Cardiovascular system: S1 & S2 heard, RRR. No murmurs, rubs, gallops or clicks. Gastrointestinal system: Abdomen is nondistended, soft and tender in RLQ to light touch. Normal bowel sounds heard. Central nervous system: Alert and oriented. No focal neurological deficits. Musculoskeletal: BLE edema. No calf tenderness Skin: No cyanosis. No rashes Psychiatry: Judgement and insight appear normal. Mood & affect appropriate.    Data Reviewed: I have personally reviewed following labs and imaging studies  CBC Lab Results  Component Value Date   WBC 11.7 (H) 12/18/2021   RBC 4.06 12/18/2021   HGB 12.7 12/18/2021   HCT 37.5 12/18/2021   MCV 92.4 12/18/2021   MCH 31.3 12/18/2021   PLT 256 12/18/2021   MCHC 33.9 12/18/2021   RDW 13.6 12/18/2021   LYMPHSABS 2.5 12/17/2021   MONOABS 0.6 12/17/2021   EOSABS 0.2 12/17/2021   BASOSABS 0.1 63/14/9702     Last metabolic panel Lab Results  Component Value Date   NA 140 12/18/2021   K 3.9 12/18/2021   CL 113 (H) 12/18/2021   CO2 20 (L) 12/18/2021   BUN 26 (H) 12/18/2021   CREATININE 2.47 (H) 12/18/2021   GLUCOSE 159 (H) 12/18/2021   GFRNONAA 25 (L) 12/18/2021   GFRAA  54 (L) 08/11/2020   CALCIUM 8.9 12/18/2021   PROT 7.6 12/18/2021   ALBUMIN 3.3 (L) 12/18/2021    LABGLOB 3.5 07/19/2021   AGRATIO 1.3 08/11/2020   BILITOT 0.4 12/18/2021   ALKPHOS 78 12/18/2021   AST 23 12/18/2021   ALT 27 12/18/2021   ANIONGAP 7 12/18/2021    GFR: CrCl cannot be calculated (Unknown ideal weight.).  No results found for this or any previous visit (from the past 240 hour(s)).    Radiology Studies: DG Chest 2 View  Result Date: 12/17/2021 CLINICAL DATA:  Chest pain short of breath EXAM: CHEST - 2 VIEW COMPARISON:  05/10/2008 FINDINGS: Hardware in the cervicothoracic spine. Retained metallic rods at the thoracolumbar spine as before. No acute airspace disease, pleural effusion, or pneumothorax. Stable cardiomediastinal silhouette. IMPRESSION: No active cardiopulmonary disease. Electronically Signed   By: Donavan Foil M.D.   On: 12/17/2021 19:16   CT Renal Stone Study  Result Date: 12/17/2021 CLINICAL DATA:  Right abdominal pain EXAM: CT ABDOMEN AND PELVIS WITHOUT CONTRAST TECHNIQUE: Multidetector CT imaging of the abdomen and pelvis was performed following the standard protocol without IV contrast. RADIATION DOSE REDUCTION: This exam was performed according to the departmental dose-optimization program which includes automated exposure control, adjustment of the mA and/or kV according to patient size and/or use of iterative reconstruction technique. COMPARISON:  None Available. FINDINGS: Lower chest: No acute abnormality. Hepatobiliary: Cholelithiasis without pericholecystic inflammatory change identified. No intra or extrahepatic biliary ductal dilation. No definite intrahepatic mass on this noncontrast examination. Pancreas: Unremarkable Spleen: Unremarkable Adrenals/Urinary Tract: The right adrenal gland is unremarkable. Stable 2.6 cm macroscopic fat containing mass within the left adrenal gland in keeping with a benign adrenal myelolipoma. The kidneys are normal in size and position. Vascular calcifications are seen within the renal hila bilaterally. Stable 2 mm  nonobstructing calculi noted within the lower pole of the right kidney. No ureteral calculi. No hydronephrosis. No perinephric inflammatory stranding or fluid collections are seen. The bladder is unremarkable. Stomach/Bowel: Mild scattered descending and sigmoid colonic diverticulosis. The stomach, small bowel, and large bowel are otherwise unremarkable. Appendix normal. No free intraperitoneal gas or fluid. Vascular/Lymphatic: Aortic atherosclerosis. No enlarged abdominal or pelvic lymph nodes. Reproductive: Fullness of the ovaries bilaterally is unchanged from prior examination better assessed on prior MRI examination of 11/06/2017. This is unchanged. The pelvic organs are otherwise unremarkable. Other: No abdominal wall hernia. 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 No acute bone abnormality. IMPRESSION: No acute intra-abdominal pathology identified. No definite radiographic explanation for the patient's reported symptoms. Cholelithiasis. 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 Minimal right nonobstructing nephrolithiasis, stable. Mild distal colonic diverticulosis without superimposed acute inflammatory change. Extensive lumbar fusion, unchanged. Aortic Atherosclerosis (ICD10-I70.0). Electronically Signed   By: Fidela Salisbury M.D.   On: 12/17/2021 22:32   US Abdomen Limited RUQ (LIVER/GB)  Result Date: 12/18/2021 CLINICAL DATA:  [id number].  Right upper quadrant abdominal pain. EXAM: ULTRASOUND ABDOMEN LIMITED RIGHT UPPER QUADRANT COMPARISON:  None Available. FINDINGS: Gallbladder: Small stones and sludge is seen within the gallbladder. The gallbladder, however, is not distended, there is no gallbladder wall thickening, and no pericholecystic fluid is identified. The sonographic Percell Miller sign is reportedly negative. Common bile duct: Diameter: 6 mm in proximal  diameter Liver: Evaluation of the right hepatic lobe is limited by the patient's body habitus and overlying aerated lung. Much of the right  hepatic lobe is obscured. No focal intrahepatic masses are seen within the visualized liver. No intrahepatic biliary ductal dilation. Hepatic parenchymal echogenicity and echotexture is normal. Portal vein is patent on color Doppler imaging with normal direction of blood flow towards the liver. Other: No ascites IMPRESSION: Cholelithiasis without sonographic evidence of acute cholecystitis. Limited, but unremarkable examination of the liver. Electronically Signed   By: Fidela Salisbury M.D.   On: 12/18/2021 00:03      LOS: 0 days    Cordelia Poche, MD [facility] 12/18/2021, 7:23 AM   If 7PM-7AM, please contact night-coverage [email or web address]

## 2021-12-18 NOTE — Consult Note (Signed)
[facility] Surgery Consult Note  Briana Williams Nov 23, 1980  [id number].    Requesting MD: Cordelia Poche Chief Complaint/Reason for Consult: abdominal pain, gallstones  HPI:  Briana Williams is a 41yo female PMH HTN, HLD, DM, obesity, chronic pain who was admitted to [facility] last night with acute on chronic abdominal pain and flutters. States that she has had intermittent abdominal pain for 5-6 months. The pain is mostly epigastric and RUQ. States that her episodes of pain are random. Denies aggravating or alleviating factors. No association with PO intake. During the episodes she does experience nausea and sometimes vomiting. The main reason she came to the hospital last night was because she was experiencing flutters. Denies dysuria, fevers, chills.  In the ED she underwent CT renal stone study which showed no intraabdominal pathology. RUQ u/s shows cholelithiasis without sonographic evidence of acute cholecystitis. WBC 9.7>>11.7. LFTs WNL. Lipase 90. General surgery asked to see.  Abdominal surgical history: ovarian lesion removal (benign) Anticoagulants: none Nonsmoker Denies alcohol or illicit drug use   Family History  Problem Relation Age of Onset   Cancer Maternal Grandmother        mandibular cancer    Diabetes Mother    Heart failure Mother    CAD Mother        triple CABG   Diabetes Maternal Aunt    Stroke Father     Past Medical History:  Diagnosis Date   Arthritis    GERD (gastroesophageal reflux disease)    Headache    migraine 2 weeks ago   Hyperlipidemia    Hypertension associated with diabetes ([place])    Renal disorder    has both kidneys, compacted and 'squashed together"   Scoliosis    Sleep apnea    in the midst of being tested for it.   waiting on scheduling    Type 2 diabetes mellitus treated with insulin ([place])    dx 2000    Past Surgical History:  Procedure Laterality Date   BACK SURGERY     CERVICAL FUSION     frontal closed  sutures to forehead     Har[place] Rod placement     HIP SURGERY     staph infection   LUMBAR FUSION     RADIOLOGY WITH ANESTHESIA N/A 02/25/2017   Procedure: RADIOLOGY WITH ANESTHESIA/MRI THORACIC SPINE AND LEFT SHOULDER WITHOUT CONTRAST.;  Surgeon: Radiologist, Medication, MD;  Location: [facility];  Service: Radiology;  Laterality: N/A;    Social History:  reports that she has never smoked. She has never used smokeless tobacco. She reports that she does not drink alcohol and does not use drugs.  Allergies:  Allergies  Allergen Reactions   Hydrocodone Itching    Note: benadryl does NOT help   Lisinopril Nausea And Vomiting    Caused nausea   Metformin And Related Other (See Comments)    Causes blisters on skin   Norvasc [Amlodipine Besylate] Swelling    Ankle swelling   Ultrasound Gel Rash and Other (See Comments)    Patient gets a red rash with use of ultrasound gel/ burns skin    Medications Prior to Admission  Medication Sig Dispense Refill   acetaminophen (TYLENOL) 650 MG CR tablet Take 650 mg by mouth every 8 (eight) hours as needed for pain.     albuterol (PROVENTIL HFA;VENTOLIN HFA) 108 (90 Base) MCG/ACT inhaler Inhale 1 puff into the lungs every 6 (six) hours as needed for wheezing or shortness of breath. 8 g  3   amitriptyline (ELAVIL) 25 MG tablet TAKE 1 TABLET(25 MG) BY MOUTH AT BEDTIME 30 tablet 2   atorvastatin (LIPITOR) 10 MG tablet Take 1 tablet (10 mg total) by mouth daily. 90 tablet 3   busPIRone (BUSPAR) 10 MG tablet TAKE 1 TABLET(10 MG) BY MOUTH THREE TIMES DAILY 90 tablet 1   carvedilol (COREG) 12.5 MG tablet Take 1 tablet (12.5 mg total) by mouth 2 (two) times daily with a meal. 180 tablet 3   D3-50 1.25 MG (50000 UT) capsule Take 50,000 Units by mouth once a week.     escitalopram (LEXAPRO) 20 MG tablet TAKE 1 TABLET(20 MG) BY MOUTH DAILY 90 tablet 0   ferrous sulfate 325 (65 FE) MG tablet Take 1 tablet (325 mg total) by mouth daily with breakfast. 100 tablet 1    gabapentin (NEURONTIN) 400 MG capsule Take 400 mg by mouth 3 (three) times daily.     hydrALAZINE (APRESOLINE) 10 MG tablet Take 1 tablet (10 mg total) by mouth 2 (two) times daily. 60 tablet 4   hydrochlorothiazide (HYDRODIURIL) 25 MG tablet Take 1 tablet (25 mg total) by mouth daily. 90 tablet 2   Insulin Glargine (LANTUS SOLOSTAR) 100 UNIT/ML Solostar Pen Inject 38 Units into the skin daily. 15 mL 11   NOVOLOG FLEXPEN 100 UNIT/ML FlexPen INJECT 15 UNITS INTO THE SKIN WITH THE TWO LARGEST MEALS OF THE DAY. 15 mL 2   olmesartan (BENICAR) 20 MG tablet Take 20 mg by mouth daily.     Oxycodone HCl 10 MG TABS Take 10 mg by mouth 5 (five) times daily.     tiZANidine (ZANAFLEX) 4 MG tablet TAKE 1 TABLET BY MOUTH EVERY 8 HOURS AS NEEDED (Patient taking differently: Take 4 mg by mouth every 8 (eight) hours as needed for muscle spasms.) 30 tablet 0   Vitamin D, Ergocalciferol, (DRISDOL) 1.25 MG (50000 UNIT) CAPS capsule Take 1 capsule (50,000 Units total) by mouth every 7 (seven) days. 12 capsule 1   ACCU-CHEK SOFTCLIX LANCETS lancets 1 each by Other route 3 (three) times daily. ICD 10 E11.9 100 each 12   Accu-Chek Softclix Lancets lancets Use as instructed 100 each 12   Blood Glucose Monitoring Suppl (ACCU-CHEK AVIVA PLUS) w/Device KIT 1 Device by Does not apply route 3 (three) times daily after meals. ICD 10 E11.9 1 kit 0   Blood Glucose Monitoring Suppl (ACCU-CHEK GUIDE) w/Device KIT Use as directed 1 kit 0   glucose blood (ACCU-CHEK AVIVA PLUS) test strip 1 each by Other route 3 (three) times daily. ICD 10 E11.9 100 each 12   glucose blood (ACCU-CHEK GUIDE) test strip Test 3x/day before meals 100 each 12   Insulin Pen Needle (B-D ULTRAFINE III SHORT PEN) 31G X 8 MM MISC 1 application by Does not apply route daily. 60 each 5   Lancet Devices (ACCU-CHEK SOFTCLIX) lancets 1 each by Other route 3 (three) times daily. ICD 10 E11.9 1 each 0   ondansetron (ZOFRAN ODT) 4 MG disintegrating tablet Take 1 tablet  (4 mg total) by mouth every 8 (eight) hours as needed. (Patient not taking: Reported on 12/17/2021) 20 tablet 6   sitaGLIPtin (JANUVIA) 100 MG tablet TAKE 1 TABLET (100 MG TOTAL) BY MOUTH DAILY. 30 tablet 4   SUMAtriptan (IMITREX) 100 MG tablet Take 1 tablet (100 mg total) by mouth once as needed for up to 1 dose for migraine. May repeat in 2 hours if headache persists or recurs. (Patient not taking: Reported on 12/17/2021)  12 tablet 11    Prior to Admission medications   Medication Sig Start Date End Date Taking? Authorizing Provider  acetaminophen (TYLENOL) 650 MG CR tablet Take 650 mg by mouth every 8 (eight) hours as needed for pain.   Yes [provider]  albuterol (PROVENTIL HFA;VENTOLIN HFA) 108 (90 Base) MCG/ACT inhaler Inhale 1 puff into the lungs every 6 (six) hours as needed for wheezing or shortness of breath. 10/04/18  Yes [name], MD  amitriptyline (ELAVIL) 25 MG tablet TAKE 1 TABLET(25 MG) BY MOUTH AT BEDTIME 10/29/21  Yes Ladell Pier, MD  atorvastatin (LIPITOR) 10 MG tablet Take 1 tablet (10 mg total) by mouth daily. 09/15/18  Yes Ladell Pier, MD  busPIRone (BUSPAR) 10 MG tablet TAKE 1 TABLET(10 MG) BY MOUTH THREE TIMES DAILY 11/24/20  Yes Ladell Pier, MD  carvedilol (COREG) 12.5 MG tablet Take 1 tablet (12.5 mg total) by mouth 2 (two) times daily with a meal. 09/06/20  Yes Ladell Pier, MD  D3-50 1.25 MG (50000 UT) capsule Take 50,000 Units by mouth once a week. 09/03/21  Yes [provider]  escitalopram (LEXAPRO) 20 MG tablet TAKE 1 TABLET(20 MG) BY MOUTH DAILY 11/12/20  Yes Ladell Pier, MD  ferrous sulfate 325 (65 FE) MG tablet Take 1 tablet (325 mg total) by mouth daily with breakfast. 07/05/21  Yes Ladell Pier, MD  gabapentin (NEURONTIN) 400 MG capsule Take 400 mg by mouth 3 (three) times daily.   Yes [provider]  hydrALAZINE (APRESOLINE) 10 MG tablet Take 1 tablet (10 mg total) by mouth 2 (two) times daily.  07/14/21  Yes Ladell Pier, MD  hydrochlorothiazide (HYDRODIURIL) 25 MG tablet Take 1 tablet (25 mg total) by mouth daily. 06/11/19  Yes Ladell Pier, MD  Insulin Glargine (LANTUS SOLOSTAR) 100 UNIT/ML Solostar Pen Inject 38 Units into the skin daily. 09/15/18  Yes Ladell Pier, MD  NOVOLOG FLEXPEN 100 UNIT/ML FlexPen INJECT 15 UNITS INTO THE SKIN WITH THE TWO LARGEST MEALS OF THE DAY. 11/27/18  Yes Ladell Pier, MD  olmesartan (BENICAR) 20 MG tablet Take 20 mg by mouth daily. 09/26/21  Yes [provider]  Oxycodone HCl 10 MG TABS Take 10 mg by mouth 5 (five) times daily. 06/10/21  Yes [provider]  tiZANidine (ZANAFLEX) 4 MG tablet TAKE 1 TABLET BY MOUTH EVERY 8 HOURS AS NEEDED Patient taking differently: Take 4 mg by mouth every 8 (eight) hours as needed for muscle spasms. 02/21/20  Yes Ladell Pier, MD  Vitamin D, Ergocalciferol, (DRISDOL) 1.25 MG (50000 UNIT) CAPS capsule Take 1 capsule (50,000 Units total) by mouth every 7 (seven) days. 02/28/21  Yes Ladell Pier, MD  ACCU-CHEK SOFTCLIX LANCETS lancets 1 each by Other route 3 (three) times daily. ICD 10 E11.9 09/21/15   [name], MD  Accu-Chek Softclix Lancets lancets Use as instructed 01/02/21   Ladell Pier, MD  Blood Glucose Monitoring Suppl (ACCU-CHEK AVIVA PLUS) w/Device KIT 1 Device by Does not apply route 3 (three) times daily after meals. ICD 10 E11.9 09/21/15   [name], MD  Blood Glucose Monitoring Suppl (ACCU-CHEK GUIDE) w/Device KIT Use as directed 01/02/21   Ladell Pier, MD  glucose blood (ACCU-CHEK AVIVA PLUS) test strip 1 each by Other route 3 (three) times daily. ICD 10 E11.9 09/21/15   [name], MD  glucose blood (ACCU-CHEK GUIDE) test strip Test 3x/day before meals 01/02/21   Wynetta Emery,  Dalbert Batman, MD  Insulin Pen Needle (B-D ULTRAFINE III SHORT PEN) 31G X 8 MM MISC 1 application by Does not apply route daily. 01/02/17   [name], MD  Lancet  Devices [facility]) lancets 1 each by Other route 3 (three) times daily. ICD 10 E11.9 09/21/15   Funches, Adriana Mccallum, MD  ondansetron (ZOFRAN ODT) 4 MG disintegrating tablet Take 1 tablet (4 mg total) by mouth every 8 (eight) hours as needed. Patient not taking: Reported on 12/17/2021 03/11/19   Marcial Pacas, MD  sitaGLIPtin (JANUVIA) 100 MG tablet TAKE 1 TABLET (100 MG TOTAL) BY MOUTH DAILY. 08/11/20 08/11/21  Ladell Pier, MD  SUMAtriptan (IMITREX) 100 MG tablet Take 1 tablet (100 mg total) by mouth once as needed for up to 1 dose for migraine. May repeat in 2 hours if headache persists or recurs. Patient not taking: Reported on 12/17/2021 03/11/19   Marcial Pacas, MD    Blood pressure (!) 160/87, pulse 74, temperature 98.1 F (36.7 C), temperature source Oral, resp. rate 17, SpO2 100 %. Physical Exam: General: pleasant, WD/WN female who is laying in bed in NAD HEENT: head is normocephalic, atraumatic.  Sclera are noninjected.  Pupils equal and round.  Ears and nose without any masses or lesions.  Mouth is pink and moist. Dentition fair Heart: regular, rate, and rhythm. Lungs: CTAB, no wheezes, rhonchi, or rales noted.  Respiratory effort nonlabored Abd: obese, soft, +BS, no masses, hernias, or organomegaly. Diffuse tenderness, somewhat hypersensitive to touch, more moderate TTP RUQ/epigastric region MS: calves soft and nontender Skin: warm and dry with no masses, lesions, or rashes Psych: A&Ox4 with an appropriate affect Neuro: MAEs  Results for orders placed or performed during the hospital encounter of 12/17/21 (from the past 48 hour(s))  Basic metabolic panel     Status: Abnormal   Collection Time: 12/17/21  6:44 PM  Result Value Ref Range   Sodium 135 135 - 145 mmol/L   Potassium 3.9 3.5 - 5.1 mmol/L   Chloride 108 98 - 111 mmol/L   CO2 19 (L) 22 - 32 mmol/L   Glucose, Bld 249 (H) 70 - 99 mg/dL    Comment: Glucose reference range applies only to samples taken after fasting for at  least 8 hours.   BUN 31 (H) 6 - 20 mg/dL   Creatinine, Ser 2.65 (H) 0.44 - 1.00 mg/dL   Calcium 8.5 (L) 8.9 - 10.3 mg/dL   GFR, Estimated 23 (L) >60 mL/min    Comment: (NOTE) Calculated using the CKD-EPI Creatinine Equation (2021)    Anion gap 8 5 - 15    Comment: Performed at [facility] [address], [place], [place] [address]  Troponin I (High Sensitivity)     Status: None   Collection Time: 12/17/21  6:44 PM  Result Value Ref Range   Troponin I (High Sensitivity) 14 <18 ng/L    Comment: (NOTE) Elevated high sensitivity troponin I (hsTnI) values and significant  changes across serial measurements may suggest ACS but many other  chronic and acute conditions are known to elevate hsTnI results.  Refer to the "Links" section for chest pain algorithms and additional  guidance. Performed at [facility], [place] [address] [place], [place] [address]   CBC with Differential     Status: Abnormal   Collection Time: 12/17/21  6:44 PM  Result Value Ref Range   WBC 9.7 4.0 - 10.5 K/uL   RBC 3.67 (L) 3.87 - 5.11 MIL/uL  Hemoglobin 11.3 (L) 12.0 - 15.0 g/dL   HCT 34.3 (L) 36.0 - 46.0 %   MCV 93.5 80.0 - 100.0 fL   MCH 30.8 26.0 - 34.0 pg   MCHC 32.9 30.0 - 36.0 g/dL   RDW 13.7 11.5 - 15.5 %   Platelets 214 150 - 400 K/uL   nRBC 0.0 0.0 - 0.2 %   Neutrophils Relative % 64 %   Neutro Abs 6.3 1.7 - 7.7 K/uL   Lymphocytes Relative 26 %   Lymphs Abs 2.5 0.7 - 4.0 K/uL   Monocytes Relative 6 %   Monocytes Absolute 0.6 0.1 - 1.0 K/uL   Eosinophils Relative 2 %   Eosinophils Absolute 0.2 0.0 - 0.5 K/uL   Basophils Relative 1 %   Basophils Absolute 0.1 0.0 - 0.1 K/uL   Immature Granulocytes 1 %   Abs Immature Granulocytes 0.08 (H) 0.00 - 0.07 K/uL    Comment: Performed at [facility] [address] [place], [place] [address]  I-Stat beta hCG blood, ED     Status: None   Collection Time: 12/17/21  6:58 PM  Result Value Ref Range   I-stat hCG, quantitative <5.0 <5  mIU/mL   Comment 3            Comment:   GEST. AGE      CONC.  (mIU/mL)   <=1 WEEK        5 - 50     2 WEEKS       50 - 500     3 WEEKS       100 - 10,000     4 WEEKS     1,000 - 30,000        FEMALE AND NON-PREGNANT FEMALE:     LESS THAN 5 mIU/mL   Troponin I (High Sensitivity)     Status: None   Collection Time: 12/17/21  8:23 PM  Result Value Ref Range   Troponin I (High Sensitivity) 12 <18 ng/L    Comment: (NOTE) Elevated high sensitivity troponin I (hsTnI) values and significant  changes across serial measurements may suggest ACS but many other  chronic and acute conditions are known to elevate hsTnI results.  Refer to the "Links" section for chest pain algorithms and additional  guidance. Performed at [facility], [place] [address], [place], [place] [address]   Urinalysis, Routine w reflex microscopic     Status: Abnormal   Collection Time: 12/17/21  9:05 PM  Result Value Ref Range   Color, Urine STRAW (A) YELLOW   APPearance CLEAR CLEAR   Specific Gravity, Urine 1.012 1.005 - 1.030   pH 6.0 5.0 - 8.0   Glucose, UA >=500 (A) NEGATIVE mg/dL   Hgb urine dipstick SMALL (A) NEGATIVE   Bilirubin Urine NEGATIVE NEGATIVE   Ketones, ur NEGATIVE NEGATIVE mg/dL   Protein, ur >=300 (A) NEGATIVE mg/dL   Nitrite NEGATIVE NEGATIVE   Leukocytes,Ua NEGATIVE NEGATIVE   RBC / HPF 0-5 0 - 5 RBC/hpf   WBC, UA 0-5 0 - 5 WBC/hpf   Bacteria, UA RARE (A) NONE SEEN   Squamous Epithelial / LPF 0-5 0 - 5    Comment: Performed at [facility] [address] [place], [place] [address]  Lipase, blood     Status: Abnormal   Collection Time: 12/18/21  3:15 AM  Result Value Ref Range   Lipase 90 (H) 11 - 51 U/L    Comment: Performed  at [facility], [place] [address], [place], [place] [address]  HIV Antibody (routine testing w rflx)     Status: None   Collection Time: 12/18/21  3:15 AM  Result Value Ref Range   HIV Screen 4th Generation wRfx Non Reactive Non Reactive    Comment:  Performed at [facility], [place] [address] [place], [place] [address]  Comprehensive metabolic panel     Status: Abnormal   Collection Time: 12/18/21  3:15 AM  Result Value Ref Range   Sodium 140 135 - 145 mmol/L   Potassium 3.9 3.5 - 5.1 mmol/L   Chloride 113 (H) 98 - 111 mmol/L   CO2 20 (L) 22 - 32 mmol/L   Glucose, Bld 159 (H) 70 - 99 mg/dL    Comment: Glucose reference range applies only to samples taken after fasting for at least 8 hours.   BUN 26 (H) 6 - 20 mg/dL   Creatinine, Ser 2.47 (H) 0.44 - 1.00 mg/dL   Calcium 8.9 8.9 - 10.3 mg/dL   Total Protein 7.6 6.5 - 8.1 g/dL   Albumin 3.3 (L) 3.5 - 5.0 g/dL   AST 23 15 - 41 U/L   ALT 27 0 - 44 U/L   Alkaline Phosphatase 78 38 - 126 U/L   Total Bilirubin 0.4 0.3 - 1.2 mg/dL   GFR, Estimated 25 (L) >60 mL/min    Comment: (NOTE) Calculated using the CKD-EPI Creatinine Equation (2021)    Anion gap 7 5 - 15    Comment: Performed at [facility] [address] [place], [place] [address]  CBC     Status: Abnormal   Collection Time: 12/18/21  3:15 AM  Result Value Ref Range   WBC 11.7 (H) 4.0 - 10.5 K/uL   RBC 4.06 3.87 - 5.11 MIL/uL   Hemoglobin 12.7 12.0 - 15.0 g/dL   HCT 37.5 36.0 - 46.0 %   MCV 92.4 80.0 - 100.0 fL   MCH 31.3 26.0 - 34.0 pg   MCHC 33.9 30.0 - 36.0 g/dL   RDW 13.6 11.5 - 15.5 %   Platelets 256 150 - 400 K/uL   nRBC 0.0 0.0 - 0.2 %    Comment: Performed at [facility], [place] [address] [place], [place] [address]  Glucose, capillary     Status: Abnormal   Collection Time: 12/18/21  8:20 AM  Result Value Ref Range   Glucose-Capillary 223 (H) 70 - 99 mg/dL    Comment: Glucose reference range applies only to samples taken after fasting for at least 8 hours.   DG Chest 2 View  Result Date: 12/17/2021 CLINICAL DATA:  Chest pain short of breath EXAM: CHEST - 2 VIEW COMPARISON:  05/10/2008 FINDINGS: Hardware in the cervicothoracic spine. Retained metallic rods at the thoracolumbar spine as  before. No acute airspace disease, pleural effusion, or pneumothorax. Stable cardiomediastinal silhouette. IMPRESSION: No active cardiopulmonary disease. Electronically Signed   By: Donavan Foil M.D.   On: 12/17/2021 19:16   CT Renal Stone Study  Result Date: 12/17/2021 CLINICAL DATA:  Right abdominal pain EXAM: CT ABDOMEN AND PELVIS WITHOUT CONTRAST TECHNIQUE: Multidetector CT imaging of the abdomen and pelvis was performed following the standard protocol without IV contrast. RADIATION DOSE REDUCTION: This exam was performed according to the departmental dose-optimization program which includes automated exposure control, adjustment of the mA and/or kV according to patient size and/or use of iterative reconstruction technique. COMPARISON:  None Available. FINDINGS: Lower  chest: No acute abnormality. Hepatobiliary: Cholelithiasis without pericholecystic inflammatory change identified. No intra or extrahepatic biliary ductal dilation. No definite intrahepatic mass on this noncontrast examination. Pancreas: Unremarkable Spleen: Unremarkable Adrenals/Urinary Tract: The right adrenal gland is unremarkable. Stable 2.6 cm macroscopic fat containing mass within the left adrenal gland in keeping with a benign adrenal myelolipoma. The kidneys are normal in size and position. Vascular calcifications are seen within the renal hila bilaterally. Stable 2 mm nonobstructing calculi noted within the lower pole of the right kidney. No ureteral calculi. No hydronephrosis. No perinephric inflammatory stranding or fluid collections are seen. The bladder is unremarkable. Stomach/Bowel: Mild scattered descending and sigmoid colonic diverticulosis. The stomach, small bowel, and large bowel are otherwise unremarkable. Appendix normal. No free intraperitoneal gas or fluid. Vascular/Lymphatic: Aortic atherosclerosis. No enlarged abdominal or pelvic lymph nodes. Reproductive: Fullness of the ovaries bilaterally is unchanged from prior  examination better assessed on prior MRI examination of 11/06/2017. This is unchanged. The pelvic organs are otherwise unremarkable. Other: No abdominal wall hernia. 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 No acute bone abnormality. IMPRESSION: No acute intra-abdominal pathology identified. No definite radiographic explanation for the patient's reported symptoms. Cholelithiasis. 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 Minimal right nonobstructing nephrolithiasis, stable. Mild distal colonic diverticulosis without superimposed acute inflammatory change. Extensive lumbar fusion, unchanged. Aortic Atherosclerosis (ICD10-I70.0). Electronically Signed   By: Fidela Salisbury M.D.   On: 12/17/2021 22:32   US Abdomen Limited RUQ (LIVER/GB)  Result Date: 12/18/2021 CLINICAL DATA:  [id number].  Right upper quadrant abdominal pain. EXAM: ULTRASOUND ABDOMEN LIMITED RIGHT UPPER QUADRANT COMPARISON:  None Available. FINDINGS: Gallbladder: Small stones and sludge is seen within the gallbladder. The gallbladder, however, is not distended, there is no gallbladder wall thickening, and no pericholecystic fluid is identified. The sonographic Percell Miller sign is reportedly negative. Common bile duct: Diameter: 6 mm in proximal diameter Liver: Evaluation of the right hepatic lobe is limited by the patient's body habitus and overlying aerated lung. Much of the right hepatic lobe is obscured. No focal intrahepatic masses are seen within the visualized liver. No intrahepatic biliary ductal dilation. Hepatic parenchymal echogenicity and echotexture is normal. Portal vein is patent on color Doppler imaging with normal direction of blood flow towards the liver. Other: No ascites IMPRESSION: Cholelithiasis without sonographic evidence of acute cholecystitis. Limited, but unremarkable examination of  the liver. Electronically Signed   By: Fidela Salisbury M.D.   On: 12/18/2021 00:03    Anti-infectives (From admission, onward)    None        Assessment/Plan Epigastric/RUQ abdominal pain Gallstones - CT renal stone study negative for intraabdominal pathology - RUQ u/s shows gallstones but no signs of cholecystitis - WBC was WNL yesterday and slightly elevated today, LFTs WNL, lipase mildly elevated at 90 - no indication for acute surgical intervention - Will obtain HIDA for further evaluation of the gallbladder. She does not seem to acute acute cholecystitis, and her symptoms are not consistent with classic biliary colic  ID - none VTE - lovenox FEN - HH/CM diet Foley - none  Hypertensive urgency  HTN HLD DM Obesity Chronic kidney disease Chronic pain  I reviewed hospitalist notes, last 24 h vitals and pain scores, last 48 h intake and output, last 24 h labs and trends, and last 24 h imaging results.   Wellington Hampshire, [facility] Surgery 12/18/2021, 8:35  AM Please see Amion for pager number during day hours 7:00am-4:30pm

## 2021-12-19 DIAGNOSIS — F32A Depression, unspecified: Secondary | ICD-10-CM | POA: Diagnosis not present

## 2021-12-19 DIAGNOSIS — G894 Chronic pain syndrome: Secondary | ICD-10-CM | POA: Diagnosis not present

## 2021-12-19 DIAGNOSIS — I160 Hypertensive urgency: Secondary | ICD-10-CM | POA: Diagnosis not present

## 2021-12-19 DIAGNOSIS — N1831 Chronic kidney disease, stage 3a: Secondary | ICD-10-CM | POA: Diagnosis not present

## 2021-12-19 DIAGNOSIS — D631 Anemia in chronic kidney disease: Secondary | ICD-10-CM | POA: Diagnosis not present

## 2021-12-19 DIAGNOSIS — I10 Essential (primary) hypertension: Secondary | ICD-10-CM | POA: Diagnosis not present

## 2021-12-19 LAB — BASIC METABOLIC PANEL
Anion gap: 6 (ref 5–15)
Anion gap: 8 (ref 5–15)
BUN: 38 mg/dL — ABNORMAL HIGH (ref 6–20)
BUN: 37 mg/dL — ABNORMAL HIGH (ref 6–20)
CO2: 22 mmol/L (ref 22–32)
CO2: 17 mmol/L — ABNORMAL LOW (ref 22–32)
Calcium: 8.3 mg/dL — ABNORMAL LOW (ref 8.9–10.3)
Calcium: 8.1 mg/dL — ABNORMAL LOW (ref 8.9–10.3)
Chloride: 114 mmol/L — ABNORMAL HIGH (ref 98–111)
Chloride: 114 mmol/L — ABNORMAL HIGH (ref 98–111)
Creatinine, Ser: 2.96 mg/dL — ABNORMAL HIGH (ref 0.44–1.00)
Creatinine, Ser: 2.88 mg/dL — ABNORMAL HIGH (ref 0.44–1.00)
GFR, Estimated: 20 mL/min — ABNORMAL LOW (ref >60)
GFR, Estimated: 20 mL/min — ABNORMAL LOW (ref >60)
Glucose, Bld: 181 mg/dL — ABNORMAL HIGH (ref 70–99)
Glucose, Bld: 100 mg/dL — ABNORMAL HIGH (ref 70–99)
Potassium: 4.3 mmol/L (ref 3.5–5.1)
Potassium: 4.5 mmol/L (ref 3.5–5.1)
Sodium: 142 mmol/L (ref 135–145)
Sodium: 139 mmol/L (ref 135–145)

## 2021-12-19 LAB — HEPATIC FUNCTION PANEL
ALT: 21 U/L (ref 0–44)
AST: 20 U/L (ref 15–41)
Albumin: 2.6 g/dL — ABNORMAL LOW (ref 3.5–5.0)
Alkaline Phosphatase: 67 U/L (ref 38–126)
Bilirubin, Direct: 0.1 mg/dL (ref 0.0–0.2)
Indirect Bilirubin: 0.4 mg/dL (ref 0.3–0.9)
Total Bilirubin: 0.5 mg/dL (ref 0.3–1.2)
Total Protein: 5.7 g/dL — ABNORMAL LOW (ref 6.5–8.1)

## 2021-12-19 LAB — GLUCOSE, CAPILLARY
Glucose-Capillary: 181 mg/dL — ABNORMAL HIGH (ref 70–99)
Glucose-Capillary: 171 mg/dL — ABNORMAL HIGH (ref 70–99)
Glucose-Capillary: 134 mg/dL — ABNORMAL HIGH (ref 70–99)
Glucose-Capillary: 204 mg/dL — ABNORMAL HIGH (ref 70–99)

## 2021-12-19 LAB — URINALYSIS, ROUTINE W REFLEX MICROSCOPIC
Bilirubin Urine: NEGATIVE
Glucose, UA: 150 mg/dL — AB
Ketones, ur: NEGATIVE mg/dL
Leukocytes,Ua: NEGATIVE
Nitrite: NEGATIVE
Protein, ur: >=300 mg/dL — AB
Specific Gravity, Urine: 1.016 (ref 1.005–1.030)
pH: 5 (ref 5.0–8.0)

## 2021-12-19 LAB — CBC
HCT: 28.5 % — ABNORMAL LOW (ref 36.0–46.0)
Hemoglobin: 9.5 g/dL — ABNORMAL LOW (ref 12.0–15.0)
MCH: 31.1 pg (ref 26.0–34.0)
MCHC: 33.3 g/dL (ref 30.0–36.0)
MCV: 93.4 fL (ref 80.0–100.0)
Platelets: 152 10*3/uL (ref 150–400)
RBC: 3.05 MIL/uL — ABNORMAL LOW (ref 3.87–5.11)
RDW: 14.1 % (ref 11.5–15.5)
WBC: 7.9 10*3/uL (ref 4.0–10.5)
nRBC: 0 % (ref 0.0–0.2)

## 2021-12-19 LAB — MAGNESIUM: Magnesium: 1.8 mg/dL (ref 1.7–2.4)

## 2021-12-19 LAB — LACTIC ACID, PLASMA
Lactic Acid, Venous: 1 mmol/L (ref 0.5–1.9)
Lactic Acid, Venous: 1.1 mmol/L (ref 0.5–1.9)

## 2021-12-19 MED ORDER — ENOXAPARIN SODIUM 30 MG/0.3ML IJ SOSY
30.0000 mg | PREFILLED_SYRINGE | Freq: Every day | INTRAMUSCULAR | Status: DC
  Filled 2021-12-19 (×2): qty 0.3

## 2021-12-19 MED ORDER — ONDANSETRON HCL 4 MG/2ML IJ SOLN
4.0000 mg | Freq: Once | INTRAMUSCULAR | Status: AC
  Administered 2021-12-19: 4 mg via INTRAVENOUS
  Filled 2021-12-19: qty 2

## 2021-12-19 MED ORDER — SODIUM CHLORIDE 0.9 % IV BOLUS
1000.0000 mL | Freq: Once | INTRAVENOUS | Status: AC
  Administered 2021-12-19: 1000 mL via INTRAVENOUS

## 2021-12-19 MED ORDER — MORPHINE SULFATE (PF) 2 MG/ML IV SOLN
2.0000 mg | INTRAVENOUS | Status: AC | PRN
  Administered 2021-12-19: 4 mg via INTRAVENOUS
  Filled 2021-12-19: qty 2

## 2021-12-19 NOTE — Progress Notes (Signed)
PROGRESS NOTE    Briana Williams  YHC:[id number] DOB: May 04, 1981 DOA: 12/17/2021 PCP: Ladell Pier, MD   Brief Narrative: Briana Williams is a 41 y.o. female with a history of diabetes, hypertension, scoliosis, insomnia, depression, chronic pain, hyperlipidemia, obesity, migraines, anemia who presented secondary to chest pain, palpitations, abdominal pain and shortness of breath. During admission she was also found to have a possible AKI, hypertensive urgency and nausea/vomiting. Concern for possible biliary etiology for nausea/vomiting. Hypertensive urgency secondary to nausea/vomiting and resultant medication nonadherence. General surgery consulted. HIDA scan significant for biliary dyskinesia.   Assessment and Plan:  Hypertensive urgency Primary hypertension Hypertensive urgency in setting of nausea/vomiting and inability to manage oral intake. Improved with resumption of home medications but now with hypotension.  Hypotension Unsure of etiology. Does not appear to be sepsis related. Possibly iatrogenic secondary to resumption of home medications. 1 L NS bolus x2 given with improvement of blood pressure -Discontinue home antihypertensives -Continue MIV fluids  Chest pain Resolved.  Palpitations Patient appears to be having infrequent PVCs. -Monitor on telemetry  AKI on CKD stage IIIa Patient with AKI vs worsening CKD. Creatinine of 2.65 on admission and has trended down slightly. Most recent baseline appears to be between 1.8-2.55. Creatinine worsened today, likely related to hypotension. -Continue IV fluids -Repeat BMP this afternoon and tomorrow morning  Abdominal pain Nausea and vomiting Concern for gallbladder pathology. Normal LFTs. Cholelithiasis confirmed on imaging and HIDA significant for decreased gallbladder ejection fracture consistent with biliary dyskinesia. General surgery plan for cholecystectomy this admission if able.  Diabetes mellitus,  type 2 Patient is on insulin glargine 38 units daily and Novolog 15 units BID as an outpatient -Continue SSI while inpatient  Depression -Continue Elavil, Buspar and Lexapro  Chronic pain -Continue oxycodone PRN while able to take PO -Continue morphine PRN while unable to take PO  Hyperlipidemia -Continue Lipitor  Anemia Recent iron panel was unremarkable for etiology. Anemia is extremely mild. Patient has a history of irregular menstrual cycle.  DVT prophylaxis: Lovenox Code Status:   Code Status: Full Code Family Communication: None at bedside Disposition Plan: Discharge home pending general surgery recommendations/management   Consultants:  General surgery  Procedures:  None  Antimicrobials: None    Subjective: Patient with persistent right sided abdominal pain. She has been feeling lightheaded today especially with getting up.  Objective: BP 114/68   Pulse (!) 52   Temp 97.7 F (36.5 C) (Oral)   Resp 17   SpO2 98%   Examination:  General exam: Appears calm and comfortable Respiratory system: Clear to auscultation. Respiratory effort normal. Cardiovascular system: S1 & S2 heard, RRR. No murmurs, rubs, gallops or clicks. 2+ radial pulses Gastrointestinal system: Abdomen is nondistended, soft and nontender. Normal bowel sounds heard. Central nervous system: Alert and oriented. No focal neurological deficits. Musculoskeletal: No edema. No calf tenderness Skin: No cyanosis. No rashes Psychiatry: Judgement and insight appear normal. Mood & affect appropriate.    Data Reviewed: I have personally reviewed following labs and imaging studies  CBC Lab Results  Component Value Date   WBC 7.9 12/19/2021   RBC 3.05 (L) 12/19/2021   HGB 9.5 (L) 12/19/2021   HCT 28.5 (L) 12/19/2021   MCV 93.4 12/19/2021   MCH 31.1 12/19/2021   PLT 152 12/19/2021   MCHC 33.3 12/19/2021   RDW 14.1 12/19/2021   LYMPHSABS 2.5 12/17/2021   MONOABS 0.6 12/17/2021   EOSABS 0.2  12/17/2021   BASOSABS 0.1 12/17/2021  Last metabolic panel Lab Results  Component Value Date   NA 142 12/19/2021   K 4.3 12/19/2021   CL 114 (H) 12/19/2021   CO2 22 12/19/2021   BUN 38 (H) 12/19/2021   CREATININE 2.96 (H) 12/19/2021   GLUCOSE 181 (H) 12/19/2021   GFRNONAA 20 (L) 12/19/2021   GFRAA 54 (L) 08/11/2020   CALCIUM 8.3 (L) 12/19/2021   PROT 5.7 (L) 12/19/2021   ALBUMIN 2.6 (L) 12/19/2021   LABGLOB 3.5 07/19/2021   AGRATIO 1.3 08/11/2020   BILITOT 0.5 12/19/2021   ALKPHOS 67 12/19/2021   AST 20 12/19/2021   ALT 21 12/19/2021   ANIONGAP 6 12/19/2021    GFR: CrCl cannot be calculated (Unknown ideal weight.).  No results found for this or any previous visit (from the past 240 hour(s)).    Radiology Studies: DG Chest 2 View  Result Date: 12/17/2021 CLINICAL DATA:  Chest pain short of breath EXAM: CHEST - 2 VIEW COMPARISON:  05/10/2008 FINDINGS: Hardware in the cervicothoracic spine. Retained metallic rods at the thoracolumbar spine as before. No acute airspace disease, pleural effusion, or pneumothorax. Stable cardiomediastinal silhouette. IMPRESSION: No active cardiopulmonary disease. Electronically Signed   By: Donavan Foil M.D.   On: 12/17/2021 19:16   NM Hepato W/EF  Result Date: 12/18/2021 CLINICAL DATA:  Abdominal pain, upper, chronic, assess gallbladder motility EXAM: NUCLEAR MEDICINE HEPATOBILIARY IMAGING WITH GALLBLADDER EF TECHNIQUE: Sequential images of the abdomen were obtained out to 60 minutes following intravenous administration of radiopharmaceutical. After oral ingestion of Ensure, gallbladder ejection fraction was determined. At 60 min, normal ejection fraction is greater than 33%. RADIOPHARMACEUTICALS:  5.2 mCi Tc-55m  Choletec IV COMPARISON:  None Available. FINDINGS: Prompt uptake and biliary excretion of activity by the liver is seen. Gallbladder activity is visualized, consistent with patency of cystic duct. Biliary activity passes into small  bowel, consistent with patent common bile duct. Calculated gallbladder ejection fraction is 17%. (Normal gallbladder ejection fraction with Ensure is greater than 33%.) IMPRESSION: Decreased gallbladder ejection fraction consistent with biliary dyskinesia. Electronically Signed   By: Valentino Saxon M.D.   On: 12/18/2021 15:02   CT Renal Stone Study  Result Date: 12/17/2021 CLINICAL DATA:  Right abdominal pain EXAM: CT ABDOMEN AND PELVIS WITHOUT CONTRAST TECHNIQUE: Multidetector CT imaging of the abdomen and pelvis was performed following the standard protocol without IV contrast. RADIATION DOSE REDUCTION: This exam was performed according to the departmental dose-optimization program which includes automated exposure control, adjustment of the mA and/or kV according to patient size and/or use of iterative reconstruction technique. COMPARISON:  None Available. FINDINGS: Lower chest: No acute abnormality. Hepatobiliary: Cholelithiasis without pericholecystic inflammatory change identified. No intra or extrahepatic biliary ductal dilation. No definite intrahepatic mass on this noncontrast examination. Pancreas: Unremarkable Spleen: Unremarkable Adrenals/Urinary Tract: The right adrenal gland is unremarkable. Stable 2.6 cm macroscopic fat containing mass within the left adrenal gland in keeping with a benign adrenal myelolipoma. The kidneys are normal in size and position. Vascular calcifications are seen within the renal hila bilaterally. Stable 2 mm nonobstructing calculi noted within the lower pole of the right kidney. No ureteral calculi. No hydronephrosis. No perinephric inflammatory stranding or fluid collections are seen. The bladder is unremarkable. Stomach/Bowel: Mild scattered descending and sigmoid colonic diverticulosis. The stomach, small bowel, and large bowel are otherwise unremarkable. Appendix normal. No free intraperitoneal gas or fluid. Vascular/Lymphatic: Aortic atherosclerosis. No enlarged  abdominal or pelvic lymph nodes. Reproductive: Fullness of the ovaries bilaterally is unchanged from prior examination better  assessed on prior MRI examination of 11/06/2017. This is unchanged. The pelvic organs are otherwise unremarkable. Other: No abdominal wall hernia. 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 No acute bone abnormality. IMPRESSION: No acute intra-abdominal pathology identified. No definite radiographic explanation for the patient's reported symptoms. Cholelithiasis. 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 Minimal right nonobstructing nephrolithiasis, stable. Mild distal colonic diverticulosis without superimposed acute inflammatory change. Extensive lumbar fusion, unchanged. Aortic Atherosclerosis (ICD10-I70.0). Electronically Signed   By: Fidela Salisbury M.D.   On: 12/17/2021 22:32   US Abdomen Limited RUQ (LIVER/GB)  Result Date: 12/18/2021 CLINICAL DATA:  [id number].  Right upper quadrant abdominal pain. EXAM: ULTRASOUND ABDOMEN LIMITED RIGHT UPPER QUADRANT COMPARISON:  None Available. FINDINGS: Gallbladder: Small stones and sludge is seen within the gallbladder. The gallbladder, however, is not distended, there is no gallbladder wall thickening, and no pericholecystic fluid is identified. The sonographic Percell Miller sign is reportedly negative. Common bile duct: Diameter: 6 mm in proximal diameter Liver: Evaluation of the right hepatic lobe is limited by the patient's body habitus and overlying aerated lung. Much of the right hepatic lobe is obscured. No focal intrahepatic masses are seen within the visualized liver. No intrahepatic biliary ductal dilation. Hepatic parenchymal echogenicity and echotexture is normal. Portal vein is patent on color Doppler imaging with normal direction of blood flow towards the liver. Other: No ascites IMPRESSION:  Cholelithiasis without sonographic evidence of acute cholecystitis. Limited, but unremarkable examination of the liver. Electronically Signed   By: Fidela Salisbury M.D.   On: 12/18/2021 00:03      LOS: 1 day    Cordelia Poche, MD [facility] 12/19/2021, 4:00 PM   If 7PM-7AM, please contact night-coverage [email or web address]

## 2021-12-19 NOTE — Progress Notes (Signed)
Patient ID: Briana Williams, female   DOB: June 27, 1981, 41 y.o.   MRN: [id number] [facility] Surgery Progress Note     Subjective: CC-  Abdominal unchanged. Continues to have constant diffuse abdominal pain, most severe RUQ/epigastric. Tolerated diet yesterday. Some nausea, no emesis. BP soft this morning and Cr up.  Objective: Vital signs in last 24 hours: Temp:  [97.4 F (36.3 C)-99.3 F (37.4 C)] 97.7 F (36.5 C) (05/24 0752) Pulse Rate:  [56-72] 56 (05/24 0752) Resp:  [13-18] 17 (05/24 0752) BP: (78-157)/(47-93) 78/47 (05/24 0752) SpO2:  [97 %-100 %] 98 % (05/24 0752)    Intake/Output from previous day: No intake/output data recorded. Intake/Output this shift: No intake/output data recorded.  PE: Gen:  Alert, NAD Card:  RRR Pulm:  rate and effort normal on room air Abd: obese, soft, +BS, no masses, hernias, or organomegaly. Diffuse tenderness, somewhat hypersensitive to touch, more moderate TTP RUQ/epigastric region  Lab Results:  Recent Labs    12/18/21 0315 12/19/21 0656  WBC 11.7* 7.9  HGB 12.7 9.5*  HCT 37.5 28.5*  PLT 256 152   BMET Recent Labs    12/18/21 0315 12/19/21 0656  NA 140 142  K 3.9 4.3  CL 113* 114*  CO2 20* 22  GLUCOSE 159* 181*  BUN 26* 38*  CREATININE 2.47* 2.96*  CALCIUM 8.9 8.3*   PT/INR No results for input(s): LABPROT, INR in the last 72 hours. CMP     Component Value Date/Time   NA 142 12/19/2021 0656   NA 141 06/27/2021 1224   K 4.3 12/19/2021 0656   CL 114 (H) 12/19/2021 0656   CO2 22 12/19/2021 0656   GLUCOSE 181 (H) 12/19/2021 0656   BUN 38 (H) 12/19/2021 0656   BUN 32 (H) 06/27/2021 1224   CREATININE 2.96 (H) 12/19/2021 0656   CREATININE 2.55 (H) 07/19/2021 1337   CREATININE 0.59 09/21/2015 1740   CALCIUM 8.3 (L) 12/19/2021 0656   PROT 7.6 12/18/2021 0315   PROT 7.0 08/11/2020 1632   ALBUMIN 3.3 (L) 12/18/2021 0315   ALBUMIN 3.9 08/11/2020 1632   AST 23 12/18/2021 0315   AST 16 07/19/2021 1337    ALT 27 12/18/2021 0315   ALT 16 07/19/2021 1337   ALKPHOS 78 12/18/2021 0315   BILITOT 0.4 12/18/2021 0315   BILITOT 0.4 07/19/2021 1337   GFRNONAA 20 (L) 12/19/2021 0656   GFRNONAA 24 (L) 07/19/2021 1337   GFRNONAA >89 09/21/2015 1740   GFRAA 54 (L) 08/11/2020 1632   GFRAA >89 09/21/2015 1740   Lipase     Component Value Date/Time   LIPASE 90 (H) 12/18/2021 0315       Studies/Results: DG Chest 2 View  Result Date: 12/17/2021 CLINICAL DATA:  Chest pain short of breath EXAM: CHEST - 2 VIEW COMPARISON:  05/10/2008 FINDINGS: Hardware in the cervicothoracic spine. Retained metallic rods at the thoracolumbar spine as before. No acute airspace disease, pleural effusion, or pneumothorax. Stable cardiomediastinal silhouette. IMPRESSION: No active cardiopulmonary disease. Electronically Signed   By: Donavan Foil M.D.   On: 12/17/2021 19:16   NM Hepato W/EF  Result Date: 12/18/2021 CLINICAL DATA:  Abdominal pain, upper, chronic, assess gallbladder motility EXAM: NUCLEAR MEDICINE HEPATOBILIARY IMAGING WITH GALLBLADDER EF TECHNIQUE: Sequential images of the abdomen were obtained out to 60 minutes following intravenous administration of radiopharmaceutical. After oral ingestion of Ensure, gallbladder ejection fraction was determined. At 60 min, normal ejection fraction is greater than 33%. RADIOPHARMACEUTICALS:  5.2 mCi Tc-46m  Choletec IV  COMPARISON:  None Available. FINDINGS: Prompt uptake and biliary excretion of activity by the liver is seen. Gallbladder activity is visualized, consistent with patency of cystic duct. Biliary activity passes into small bowel, consistent with patent common bile duct. Calculated gallbladder ejection fraction is 17%. (Normal gallbladder ejection fraction with Ensure is greater than 33%.) IMPRESSION: Decreased gallbladder ejection fraction consistent with biliary dyskinesia. Electronically Signed   By: Valentino Saxon M.D.   On: 12/18/2021 15:02   CT Renal Stone  Study  Result Date: 12/17/2021 CLINICAL DATA:  Right abdominal pain EXAM: CT ABDOMEN AND PELVIS WITHOUT CONTRAST TECHNIQUE: Multidetector CT imaging of the abdomen and pelvis was performed following the standard protocol without IV contrast. RADIATION DOSE REDUCTION: This exam was performed according to the departmental dose-optimization program which includes automated exposure control, adjustment of the mA and/or kV according to patient size and/or use of iterative reconstruction technique. COMPARISON:  None Available. FINDINGS: Lower chest: No acute abnormality. Hepatobiliary: Cholelithiasis without pericholecystic inflammatory change identified. No intra or extrahepatic biliary ductal dilation. No definite intrahepatic mass on this noncontrast examination. Pancreas: Unremarkable Spleen: Unremarkable Adrenals/Urinary Tract: The right adrenal gland is unremarkable. Stable 2.6 cm macroscopic fat containing mass within the left adrenal gland in keeping with a benign adrenal myelolipoma. The kidneys are normal in size and position. Vascular calcifications are seen within the renal hila bilaterally. Stable 2 mm nonobstructing calculi noted within the lower pole of the right kidney. No ureteral calculi. No hydronephrosis. No perinephric inflammatory stranding or fluid collections are seen. The bladder is unremarkable. Stomach/Bowel: Mild scattered descending and sigmoid colonic diverticulosis. The stomach, small bowel, and large bowel are otherwise unremarkable. Appendix normal. No free intraperitoneal gas or fluid. Vascular/Lymphatic: Aortic atherosclerosis. No enlarged abdominal or pelvic lymph nodes. Reproductive: Fullness of the ovaries bilaterally is unchanged from prior examination better assessed on prior MRI examination of 11/06/2017. This is unchanged. The pelvic organs are otherwise unremarkable. Other: No abdominal wall hernia. Musculoskeletal: Extensive thoracolumbar fusion with ankylosis of the vertebral  bodies and posterior elements of the visualized thoracic, lumbar CT on and sacral spine is again noted. No acute bone abnormality. IMPRESSION: No acute intra-abdominal pathology identified. No definite radiographic explanation for the patient's reported symptoms. Cholelithiasis. 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 Minimal right nonobstructing nephrolithiasis, stable. Mild distal colonic diverticulosis without superimposed acute inflammatory change. Extensive lumbar fusion, unchanged. Aortic Atherosclerosis (ICD10-I70.0). Electronically Signed   By: Fidela Salisbury M.D.   On: 12/17/2021 22:32   US Abdomen Limited RUQ (LIVER/GB)  Result Date: 12/18/2021 CLINICAL DATA:  [id number].  Right upper quadrant abdominal pain. EXAM: ULTRASOUND ABDOMEN LIMITED RIGHT UPPER QUADRANT COMPARISON:  None Available. FINDINGS: Gallbladder: Small stones and sludge is seen within the gallbladder. The gallbladder, however, is not distended, there is no gallbladder wall thickening, and no pericholecystic fluid is identified. The sonographic Percell Miller sign is reportedly negative. Common bile duct: Diameter: 6 mm in proximal diameter Liver: Evaluation of the right hepatic lobe is limited by the patient's body habitus and overlying aerated lung. Much of the right hepatic lobe is obscured. No focal intrahepatic masses are seen within the visualized liver. No intrahepatic biliary ductal dilation. Hepatic parenchymal echogenicity and echotexture is normal. Portal vein is patent on color Doppler imaging with normal direction of blood flow towards the liver. Other: No ascites IMPRESSION: Cholelithiasis without sonographic evidence of acute cholecystitis. Limited, but unremarkable examination of the liver. Electronically Signed   By: Fidela Salisbury M.D.   On: 12/18/2021 00:03  Anti-infectives: Anti-infectives (From admission, onward)    None        Assessment/Plan  Epigastric/RUQ abdominal pain Gallstones Biliary  dyskinesia - CT renal stone study negative for intraabdominal pathology - RUQ u/s shows gallstones but no signs of cholecystitis - HIDA negative for cholecystitis, EF 17%  - Continues to have abdominal pain. Reasonable to consider laparoscopic cholecystectomy given biliary dyskinesia on HIDA, although I discussed with the patient and her husband that there is no guarantee that this will fix her abdominal pain. They are in agreement to proceed. However, given her hypotension and rising creatinine I discussed with the primary team and we will wait on surgery today. She is going to get a renal u/s. Will make her NPO after midnight for possible surgery tomorrow if medically optimized.    ID - none VTE - lovenox FEN - HH/CM diet, NPO after MN Foley - none   Hypertensive urgency  HTN HLD DM Obesity Chronic kidney disease Chronic pain   I reviewed hospitalist notes, last 24 h vitals and pain scores, last 48 h intake and output, last 24 h labs and trends, and last 24 h imaging results.    LOS: 1 day    Wellington Hampshire, [facility] 12/19/2021, 8:37 AM Please see Amion for pager number during day hours 7:00am-4:30pm

## 2021-12-20 ENCOUNTER — Inpatient Hospital Stay (HOSPITAL_COMMUNITY): Payer: Medicaid Other | Admitting: Certified Registered Nurse Anesthetist

## 2021-12-20 ENCOUNTER — Inpatient Hospital Stay (HOSPITAL_COMMUNITY): Payer: Medicaid Other

## 2021-12-20 ENCOUNTER — Other Ambulatory Visit: Payer: Self-pay

## 2021-12-20 ENCOUNTER — Encounter (HOSPITAL_COMMUNITY)
Admission: EM | Disposition: A | Discharge status: Home / Self Care | Payer: Self-pay | Source: Home / Self Care | Attending: Family Medicine

## 2021-12-20 ENCOUNTER — Encounter (HOSPITAL_COMMUNITY): Payer: Self-pay | Admitting: Internal Medicine

## 2021-12-20 DIAGNOSIS — I129 Hypertensive chronic kidney disease with stage 1 through stage 4 chronic kidney disease, or unspecified chronic kidney disease: Secondary | ICD-10-CM | POA: Diagnosis not present

## 2021-12-20 DIAGNOSIS — R109 Unspecified abdominal pain: Secondary | ICD-10-CM | POA: Diagnosis not present

## 2021-12-20 DIAGNOSIS — K8010 Calculus of gallbladder with chronic cholecystitis without obstruction: Secondary | ICD-10-CM | POA: Diagnosis not present

## 2021-12-20 DIAGNOSIS — G894 Chronic pain syndrome: Secondary | ICD-10-CM | POA: Diagnosis not present

## 2021-12-20 DIAGNOSIS — E119 Type 2 diabetes mellitus without complications: Secondary | ICD-10-CM | POA: Diagnosis not present

## 2021-12-20 DIAGNOSIS — R112 Nausea with vomiting, unspecified: Secondary | ICD-10-CM | POA: Diagnosis not present

## 2021-12-20 DIAGNOSIS — I160 Hypertensive urgency: Secondary | ICD-10-CM | POA: Diagnosis not present

## 2021-12-20 DIAGNOSIS — K828 Other specified diseases of gallbladder: Secondary | ICD-10-CM | POA: Diagnosis not present

## 2021-12-20 DIAGNOSIS — I10 Essential (primary) hypertension: Secondary | ICD-10-CM

## 2021-12-20 DIAGNOSIS — K819 Cholecystitis, unspecified: Secondary | ICD-10-CM

## 2021-12-20 DIAGNOSIS — F32A Depression, unspecified: Secondary | ICD-10-CM | POA: Diagnosis not present

## 2021-12-20 DIAGNOSIS — M1990 Unspecified osteoarthritis, unspecified site: Secondary | ICD-10-CM | POA: Diagnosis not present

## 2021-12-20 DIAGNOSIS — E1129 Type 2 diabetes mellitus with other diabetic kidney complication: Secondary | ICD-10-CM | POA: Diagnosis not present

## 2021-12-20 DIAGNOSIS — N1831 Chronic kidney disease, stage 3a: Secondary | ICD-10-CM | POA: Diagnosis not present

## 2021-12-20 DIAGNOSIS — N179 Acute kidney failure, unspecified: Secondary | ICD-10-CM | POA: Diagnosis not present

## 2021-12-20 DIAGNOSIS — N1832 Chronic kidney disease, stage 3b: Secondary | ICD-10-CM | POA: Diagnosis not present

## 2021-12-20 DIAGNOSIS — D631 Anemia in chronic kidney disease: Secondary | ICD-10-CM | POA: Diagnosis not present

## 2021-12-20 DIAGNOSIS — Z794 Long term (current) use of insulin: Secondary | ICD-10-CM | POA: Diagnosis not present

## 2021-12-20 HISTORY — PX: CHOLECYSTECTOMY: SHX55

## 2021-12-20 LAB — GLUCOSE, CAPILLARY
Glucose-Capillary: 152 mg/dL — ABNORMAL HIGH (ref 70–99)
Glucose-Capillary: 94 mg/dL (ref 70–99)
Glucose-Capillary: 151 mg/dL — ABNORMAL HIGH (ref 70–99)
Glucose-Capillary: 190 mg/dL — ABNORMAL HIGH (ref 70–99)
Glucose-Capillary: 291 mg/dL — ABNORMAL HIGH (ref 70–99)

## 2021-12-20 LAB — CBC
HCT: 30.1 % — ABNORMAL LOW (ref 36.0–46.0)
Hemoglobin: 10 g/dL — ABNORMAL LOW (ref 12.0–15.0)
MCH: 31.7 pg (ref 26.0–34.0)
MCHC: 33.2 g/dL (ref 30.0–36.0)
MCV: 95.6 fL (ref 80.0–100.0)
Platelets: 186 10*3/uL (ref 150–400)
RBC: 3.15 MIL/uL — ABNORMAL LOW (ref 3.87–5.11)
RDW: 14.3 % (ref 11.5–15.5)
WBC: 10.1 10*3/uL (ref 4.0–10.5)
nRBC: 0 % (ref 0.0–0.2)

## 2021-12-20 LAB — BASIC METABOLIC PANEL
Anion gap: 5 (ref 5–15)
BUN: 45 mg/dL — ABNORMAL HIGH (ref 6–20)
CO2: 18 mmol/L — ABNORMAL LOW (ref 22–32)
Calcium: 7.9 mg/dL — ABNORMAL LOW (ref 8.9–10.3)
Chloride: 113 mmol/L — ABNORMAL HIGH (ref 98–111)
Creatinine, Ser: 3.38 mg/dL — ABNORMAL HIGH (ref 0.44–1.00)
GFR, Estimated: 17 mL/min — ABNORMAL LOW (ref >60)
Glucose, Bld: 191 mg/dL — ABNORMAL HIGH (ref 70–99)
Potassium: 4.5 mmol/L (ref 3.5–5.1)
Sodium: 136 mmol/L (ref 135–145)

## 2021-12-20 SURGERY — LAPAROSCOPIC CHOLECYSTECTOMY
Anesthesia: General

## 2021-12-20 MED ORDER — MIDAZOLAM HCL 2 MG/2ML IJ SOLN
INTRAMUSCULAR | Status: AC
  Filled 2021-12-20: qty 2

## 2021-12-20 MED ORDER — DEXAMETHASONE SODIUM PHOSPHATE 10 MG/ML IJ SOLN
INTRAMUSCULAR | Status: DC | PRN
  Administered 2021-12-20: 10 mg via INTRAVENOUS

## 2021-12-20 MED ORDER — ORAL CARE MOUTH RINSE: 15.0000 mL | Freq: Once | OROMUCOSAL | Status: AC

## 2021-12-20 MED ORDER — FENTANYL CITRATE (PF) 100 MCG/2ML IJ SOLN: 25.0000 ug | INTRAMUSCULAR | Status: DC | PRN

## 2021-12-20 MED ORDER — KETOROLAC TROMETHAMINE 30 MG/ML IJ SOLN
INTRAMUSCULAR | Status: AC
  Filled 2021-12-20: qty 1

## 2021-12-20 MED ORDER — INSULIN ASPART 100 UNIT/ML IJ SOLN: 0.0000 [IU] | INTRAMUSCULAR | Status: DC | PRN

## 2021-12-20 MED ORDER — PHENYLEPHRINE 80 MCG/ML (10ML) SYRINGE FOR IV PUSH (FOR BLOOD PRESSURE SUPPORT)
PREFILLED_SYRINGE | INTRAVENOUS | Status: AC
  Filled 2021-12-20: qty 10

## 2021-12-20 MED ORDER — BUPIVACAINE-EPINEPHRINE (PF) 0.25% -1:200000 IJ SOLN
INTRAMUSCULAR | Status: AC
  Filled 2021-12-20: qty 30

## 2021-12-20 MED ORDER — CHLORHEXIDINE GLUCONATE 0.12 % MT SOLN
OROMUCOSAL | Status: AC
  Filled 2021-12-20: qty 15

## 2021-12-20 MED ORDER — MIDAZOLAM HCL 2 MG/2ML IJ SOLN
INTRAMUSCULAR | Status: DC | PRN
  Administered 2021-12-20 (×2): 1 mg via INTRAVENOUS

## 2021-12-20 MED ORDER — MORPHINE SULFATE (PF) 2 MG/ML IV SOLN
1.0000 mg | Freq: Once | INTRAVENOUS | Status: AC
  Administered 2021-12-20: 1 mg via INTRAVENOUS
  Filled 2021-12-20: qty 1

## 2021-12-20 MED ORDER — PROPOFOL 10 MG/ML IV BOLUS
INTRAVENOUS | Status: DC | PRN
  Administered 2021-12-20: 200 mg via INTRAVENOUS

## 2021-12-20 MED ORDER — ONDANSETRON HCL 4 MG/2ML IJ SOLN
4.0000 mg | Freq: Four times a day (QID) | INTRAMUSCULAR | Status: DC | PRN
  Administered 2021-12-20 – 2021-12-21 (×2): 4 mg via INTRAVENOUS
  Filled 2021-12-20 (×2): qty 2

## 2021-12-20 MED ORDER — PROPOFOL 10 MG/ML IV BOLUS
INTRAVENOUS | Status: AC
  Filled 2021-12-20: qty 20

## 2021-12-20 MED ORDER — AMISULPRIDE (ANTIEMETIC) 5 MG/2ML IV SOLN: 10.0000 mg | Freq: Once | INTRAVENOUS | Status: DC | PRN

## 2021-12-20 MED ORDER — SODIUM CHLORIDE 0.9 % IV SOLN
2.0000 g | Freq: Once | INTRAVENOUS | Status: AC
  Administered 2021-12-20: 2 g via INTRAVENOUS
  Filled 2021-12-20: qty 20

## 2021-12-20 MED ORDER — ONDANSETRON HCL 4 MG/2ML IJ SOLN: 4.0000 mg | Freq: Once | INTRAMUSCULAR | Status: DC | PRN

## 2021-12-20 MED ORDER — LACTATED RINGERS IV SOLN: INTRAVENOUS | Status: DC

## 2021-12-20 MED ORDER — CHLORHEXIDINE GLUCONATE 0.12 % MT SOLN
15.0000 mL | Freq: Once | OROMUCOSAL | Status: AC
  Administered 2021-12-20: 15 mL via OROMUCOSAL

## 2021-12-20 MED ORDER — DEXAMETHASONE SODIUM PHOSPHATE 10 MG/ML IJ SOLN
INTRAMUSCULAR | Status: AC
  Filled 2021-12-20: qty 2

## 2021-12-20 MED ORDER — SUGAMMADEX SODIUM 200 MG/2ML IV SOLN
INTRAVENOUS | Status: DC | PRN
  Administered 2021-12-20 (×2): 200 mg via INTRAVENOUS

## 2021-12-20 MED ORDER — ROCURONIUM BROMIDE 10 MG/ML (PF) SYRINGE
PREFILLED_SYRINGE | INTRAVENOUS | Status: AC
  Filled 2021-12-20: qty 20

## 2021-12-20 MED ORDER — FENTANYL CITRATE (PF) 250 MCG/5ML IJ SOLN
INTRAMUSCULAR | Status: DC | PRN
  Administered 2021-12-20: 50 ug via INTRAVENOUS

## 2021-12-20 MED ORDER — LIDOCAINE 2% (20 MG/ML) 5 ML SYRINGE
INTRAMUSCULAR | Status: DC | PRN
  Administered 2021-12-20: 80 mg via INTRAVENOUS

## 2021-12-20 MED ORDER — OXYCODONE HCL 5 MG/5ML PO SOLN: 5.0000 mg | Freq: Once | ORAL | Status: DC | PRN

## 2021-12-20 MED ORDER — 0.9 % SODIUM CHLORIDE (POUR BTL) OPTIME
TOPICAL | Status: DC | PRN
  Administered 2021-12-20: 1000 mL

## 2021-12-20 MED ORDER — FENTANYL CITRATE PF 50 MCG/ML IJ SOSY
25.0000 ug | PREFILLED_SYRINGE | INTRAMUSCULAR | Status: DC | PRN
  Administered 2021-12-20 – 2021-12-21 (×3): 50 ug via INTRAVENOUS
  Filled 2021-12-20 (×3): qty 1

## 2021-12-20 MED ORDER — BUPIVACAINE-EPINEPHRINE 0.25% -1:200000 IJ SOLN
INTRAMUSCULAR | Status: DC | PRN
Start: 2021-12-20 — End: 2021-12-20
  Administered 2021-12-20: 30 mL

## 2021-12-20 MED ORDER — MORPHINE SULFATE (PF) 2 MG/ML IV SOLN
2.0000 mg | INTRAVENOUS | Status: DC | PRN
  Administered 2021-12-20: 2 mg via INTRAVENOUS
  Filled 2021-12-20: qty 1

## 2021-12-20 MED ORDER — ONDANSETRON HCL 4 MG/2ML IJ SOLN
INTRAMUSCULAR | Status: DC | PRN
  Administered 2021-12-20: 4 mg via INTRAVENOUS

## 2021-12-20 MED ORDER — OXYCODONE HCL 5 MG PO TABS
10.0000 mg | ORAL_TABLET | Freq: Four times a day (QID) | ORAL | Status: DC | PRN
  Administered 2021-12-20 – 2021-12-21 (×3): 10 mg via ORAL
  Filled 2021-12-20 (×3): qty 2

## 2021-12-20 MED ORDER — OXYCODONE HCL 5 MG PO TABS: 5.0000 mg | ORAL_TABLET | Freq: Once | ORAL | Status: DC | PRN

## 2021-12-20 MED ORDER — MORPHINE SULFATE (PF) 2 MG/ML IV SOLN: 2.0000 mg | INTRAVENOUS | Status: DC | PRN

## 2021-12-20 MED ORDER — SODIUM CHLORIDE 0.9 % IR SOLN
Status: DC | PRN
  Administered 2021-12-20: 1000 mL

## 2021-12-20 MED ORDER — ROCURONIUM BROMIDE 10 MG/ML (PF) SYRINGE
PREFILLED_SYRINGE | INTRAVENOUS | Status: DC | PRN
  Administered 2021-12-20: 80 mg via INTRAVENOUS
  Administered 2021-12-20: 20 mg via INTRAVENOUS

## 2021-12-20 MED ORDER — LIDOCAINE 2% (20 MG/ML) 5 ML SYRINGE
INTRAMUSCULAR | Status: AC
  Filled 2021-12-20: qty 10

## 2021-12-20 MED ORDER — FENTANYL CITRATE (PF) 250 MCG/5ML IJ SOLN
INTRAMUSCULAR | Status: AC
  Filled 2021-12-20: qty 5

## 2021-12-20 MED ORDER — ONDANSETRON HCL 4 MG/2ML IJ SOLN
INTRAMUSCULAR | Status: AC
  Filled 2021-12-20: qty 4

## 2021-12-20 SURGICAL SUPPLY — 35 items
APPLIER CLIP 5 13 M/L LIGAMAX5 (MISCELLANEOUS) ×2
BAG COUNTER SPONGE SURGICOUNT (BAG) ×2 IMPLANT
BLADE CLIPPER SURG (BLADE) IMPLANT
CANISTER SUCT 3000ML PPV (MISCELLANEOUS) ×2 IMPLANT
CHLORAPREP W/TINT 26 (MISCELLANEOUS) ×2 IMPLANT
CLIP APPLIE 5 13 M/L LIGAMAX5 (MISCELLANEOUS) ×1 IMPLANT
COVER SURGICAL LIGHT HANDLE (MISCELLANEOUS) ×2 IMPLANT
DERMABOND ADVANCED (GAUZE/BANDAGES/DRESSINGS) ×1
DERMABOND ADVANCED .7 DNX12 (GAUZE/BANDAGES/DRESSINGS) ×1 IMPLANT
ELECT REM PT RETURN 9FT ADLT (ELECTROSURGICAL) ×2
ELECTRODE REM PT RTRN 9FT ADLT (ELECTROSURGICAL) ×1 IMPLANT
GLOVE BIO SURGEON STRL SZ 6 (GLOVE) ×2 IMPLANT
GLOVE INDICATOR 6.5 STRL GRN (GLOVE) ×2 IMPLANT
GOWN STRL REUS W/ TWL LRG LVL3 (GOWN DISPOSABLE) ×3 IMPLANT
GOWN STRL REUS W/TWL LRG LVL3 (GOWN DISPOSABLE) ×6
GRASPER SUT TROCAR 14GX15 (MISCELLANEOUS) ×2 IMPLANT
KIT BASIN OR (CUSTOM PROCEDURE TRAY) ×2 IMPLANT
KIT TURNOVER KIT B (KITS) ×2 IMPLANT
NDL INSUFFLATION 14GA 120MM (NEEDLE) ×1 IMPLANT
NEEDLE INSUFFLATION 14GA 120MM (NEEDLE) ×2 IMPLANT
NS IRRIG 1000ML POUR BTL (IV SOLUTION) ×2 IMPLANT
PAD ARMBOARD 7.5X6 YLW CONV (MISCELLANEOUS) ×2 IMPLANT
POUCH SPECIMEN RETRIEVAL 10MM (ENDOMECHANICALS) ×2 IMPLANT
SCISSORS LAP 5X35 DISP (ENDOMECHANICALS) ×2 IMPLANT
SET IRRIG TUBING LAPAROSCOPIC (IRRIGATION / IRRIGATOR) ×2 IMPLANT
SET TUBE SMOKE EVAC HIGH FLOW (TUBING) ×2 IMPLANT
SLEEVE ENDOPATH XCEL 5M (ENDOMECHANICALS) ×4 IMPLANT
SPECIMEN JAR SMALL (MISCELLANEOUS) ×2 IMPLANT
SUT MNCRL AB 4-0 PS2 18 (SUTURE) ×2 IMPLANT
TOWEL GREEN STERILE (TOWEL DISPOSABLE) IMPLANT
TOWEL GREEN STERILE FF (TOWEL DISPOSABLE) ×2 IMPLANT
TRAY LAPAROSCOPIC MC (CUSTOM PROCEDURE TRAY) ×2 IMPLANT
TROCAR XCEL NON-BLD 11X100MML (ENDOMECHANICALS) ×2 IMPLANT
TROCAR XCEL NON-BLD 5MMX100MML (ENDOMECHANICALS) ×2 IMPLANT
WATER STERILE IRR 1000ML POUR (IV SOLUTION) ×2 IMPLANT

## 2021-12-20 NOTE — Transfer of Care (Signed)
Immediate Anesthesia Transfer of Care Note  Patient: Briana Williams  Procedure(s) Performed: LAPAROSCOPIC CHOLECYSTECTOMY  Patient Location: PACU  Anesthesia Type:General  Level of Consciousness: awake and drowsy  Airway & Oxygen Therapy: Patient Spontanous Breathing and Patient connected to face mask oxygen  Post-op Assessment: Report given to RN and Post -op Vital signs reviewed and stable  Post vital signs: Reviewed and stable  Last Vitals:  Vitals Value Taken Time  BP 171/84 12/20/21 1440  Temp    Pulse 68 12/20/21 1444  Resp 17 12/20/21 1444  SpO2 100 % 12/20/21 1444  Vitals shown include unvalidated device data.  Last Pain:  Vitals:   12/20/21 1159  TempSrc:   PainSc: 9       Patients Stated Pain Goal: 5 (87/86/76 7209)  Complications: No notable events documented.

## 2021-12-20 NOTE — Anesthesia Preprocedure Evaluation (Addendum)
Anesthesia Evaluation  Patient identified by MRN, date of birth, ID band Patient awake    Reviewed: Allergy & Precautions, NPO status , Patient's Chart, lab work & pertinent test results  Airway Mallampati: III  TM Distance: >3 FB Neck ROM: Limited   Comment: S/p cervical fusion, patient unable to flex or rotate left/right Dental no notable dental hx.    Pulmonary sleep apnea ,    Pulmonary exam normal breath sounds clear to auscultation       Cardiovascular hypertension, negative cardio ROS   Rhythm:Regular Rate:Normal     Neuro/Psych  Headaches, PSYCHIATRIC DISORDERS Depression    GI/Hepatic Neg liver ROS, GERD  ,  Endo/Other  diabetesMorbid obesity  Renal/GU Renal InsufficiencyRenal disease  negative genitourinary   Musculoskeletal  (+) Arthritis ,   Abdominal   Peds negative pediatric ROS (+)  Hematology  (+) Blood dyscrasia, anemia ,   Anesthesia Other Findings   Reproductive/Obstetrics negative OB ROS                          Anesthesia Physical Anesthesia Plan  ASA: 4  Anesthesia Plan: General   Post-op Pain Management: Tylenol PO (pre-op)* and Gabapentin PO (pre-op)*   Induction: Intravenous  PONV Risk Score and Plan: 3 and Treatment may vary due to age or medical condition  Airway Management Planned: Oral ETT and Video Laryngoscope Planned  Additional Equipment: None  Intra-op Plan:   Post-operative Plan: Extubation in OR  Informed Consent: I have reviewed the patients History and Physical, chart, labs and discussed the procedure including the risks, benefits and alternatives for the proposed anesthesia with the patient or authorized representative who has indicated his/her understanding and acceptance.     Dental advisory given  Plan Discussed with: Surgeon, Anesthesiologist and CRNA  Anesthesia Plan Comments:        Anesthesia Quick Evaluation

## 2021-12-20 NOTE — Consult Note (Signed)
Reason for Consult:  Renal failure  Referring Physician: Dr. 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 Chronic disease likely from htn and diabetic nephropathy. Cr 1.8-2.55 in 05/2021-07/19/21 and in the office [facility] w/ Dr. Posey Pronto Cr was 2.2 on 09/24/2021. There is a progression with negative proteinuria twice in 2010 and then 138m/dl 12/2010 and now >3044mdl. Acute component of renal failure likely from hypoperfusion as bp initally 225/85 and in under 24 hrs SBP 94 and subsequently 111-139 with BP 48hrs later 91-116/48-78. Irbesartan was given once and then d/c'd.  - agree with renal ultrasound which will evaluate kidney size, cortical thickness, and r/o obstruction (low on differential). - Patient is +1311 during hospitalization but not sure if that's completely accurate.  - Current antihypertensive regimen is good and BP on higher side of normal range; usually with diabetics with kidney disease the BP should be lower but for this patient who has been running high higher end of normal is a good range for her. - no absolute indication for RRT and no issues with oxygenation.  - will also request TEDS stockings to help mobilize some of the fluid in the lower extremities  - She may be in ATN from hypoperfusion; will defer to surgery but it can sometimes take weeks for the renal function to settle down after an acute injury from hypoperfusion.  -Monitor Daily I/Os, Daily weight  -Maintain MAP>65 for optimal renal perfusion.  -Avoid nephrotoxic medications including NSAIDs  Abdominal pain - waiting for a cholecystectomy; still  DM - on insulin therapy per primary HTN Anemia HLD Chronic pain   HPI: Briana PONTIs an [age].o. female  DM, HTN, HLD, OSA, cervical + lumbar fusion, migraines presenting with abdominal pain for a few months, sharp stabbing pain in the  right upper abdomen. She has also had nausea, vomiting, diarrhea with decreased PO intake along with pain in the abdomen. She denies fever, chills, obstructive symptoms, dysuria, hematuria. Cr was normal in 09/15/2018 and then since then has slowly risen to 1.8-2.55 in 05/2021-07/19/21.   In the ED SBP was in the 190-220's with a cr of 2.65. CT stone protocol showed gallstones, nonobstructive renal stone 22m23mn RK. Treated with Coreg 12.5 BID, HCTZ 22m39mydralazine 45m 51m Avapro (1 dose only). Hydralazine, irbesartan and coreg have been stopped.    ROS Pertinent items are noted in HPI.  Chemistry and CBC: Creatinine  Date/Time Value Ref Range Status  07/19/2021 01:37 PM 2.55 (H) 0.44 - 1.00 mg/dL Final   Creat  Date/Time Value Ref Range Status  09/21/2015 05:40 PM 0.59 0.50 - 1.10 mg/dL Final   Creatinine, Ser  Date/Time Value Ref Range Status  12/20/2021 03:22 AM 3.38 (H) 0.44 - 1.00 mg/dL Final  12/19/2021 02:51 PM 2.88 (H) 0.44 - 1.00 mg/dL Final  12/19/2021 06:56 AM 2.96 (H) 0.44 - 1.00 mg/dL Final  12/18/2021 03:15 AM 2.47 (H) 0.44 - 1.00 mg/dL Final  12/17/2021 06:44 PM 2.65 (H) 0.44 - 1.00 mg/dL Final  06/27/2021 12:24 PM 1.81 (H) 0.57 - 1.00 mg/dL Final  08/11/2020 04:32 PM 1.40 (H) 0.57 - 1.00 mg/dL Final  10/04/2018 12:07 PM 0.84 0.44 - 1.00 mg/dL Final  10/03/2018 02:01 AM 1.01 (H) 0.44 - 1.00 mg/dL Final  10/02/2018 06:02 PM 1.20 (H) 0.44 - 1.00 mg/dL Final  09/15/2018 05:02 PM 0.83 0.57 - 1.00 mg/dL Final  07/12/2018 03:46 AM  0.76 0.44 - 1.00 mg/dL Final  07/11/2018 04:16 PM 0.67 0.44 - 1.00 mg/dL Final  10/30/2017 03:03 PM 0.74 0.57 - 1.00 mg/dL Final  02/10/2017 03:52 PM 0.69 0.44 - 1.00 mg/dL Final  01/12/2011 08:36 PM 0.60 0.50 - 1.10 mg/dL Final  01/12/2011 08:21 PM 0.57 0.50 - 1.10 mg/dL Final    Comment:    **Please note change in reference range.**  10/05/2010 01:21 AM 0.61 0.40 - 1.20 mg/dL Final  05/01/2009 10:00 PM 0.58 0.40 - 1.20 mg/dL Final   09/20/2008 09:12 AM 0.52 0.4 - 1.2 mg/dL Final  09/09/2008 11:39 AM 0.58 0.4 - 1.2 mg/dL Final  08/26/2008 11:33 AM 0.56 0.4 - 1.2 mg/dL Final  05/02/2008 09:22 AM 0.53  Final  04/18/2008 10:29 PM 0.60 0.40 - 1.20 mg/dL Final   Recent Labs  Lab 12/17/21 1844 12/18/21 0315 12/19/21 0656 12/19/21 1451 12/20/21 0322  NA 135 140 142 139 136  K 3.9 3.9 4.3 4.5 4.5  CL 108 113* 114* 114* 113*  CO2 19* 20* 22 17* 18*  GLUCOSE 249* 159* 181* 100* 191*  BUN 31* 26* 38* 37* 45*  CREATININE 2.65* 2.47* 2.96* 2.88* 3.38*  CALCIUM 8.5* 8.9 8.3* 8.1* 7.9*   Recent Labs  Lab 12/17/21 1844 12/18/21 0315 12/19/21 0656 12/20/21 0322  WBC 9.7 11.7* 7.9 10.1  NEUTROABS 6.3  --   --   --   HGB 11.3* 12.7 9.5* 10.0*  HCT 34.3* 37.5 28.5* 30.1*  MCV 93.5 92.4 93.4 95.6  PLT 214 256 152 186   Liver Function Tests: Recent Labs  Lab 12/18/21 0315 12/19/21 0920  AST 23 20  ALT 27 21  ALKPHOS 78 67  BILITOT 0.4 0.5  PROT 7.6 5.7*  ALBUMIN 3.3* 2.6*   Recent Labs  Lab 12/18/21 0315  LIPASE 90*   No results for input(s): AMMONIA in the last 168 hours. Cardiac Enzymes: No results for input(s): CKTOTAL, CKMB, CKMBINDEX, TROPONINI in the last 168 hours. Iron Studies: No results for input(s): IRON, TIBC, TRANSFERRIN, FERRITIN in the last 72 hours. PT/INR: _0 (inr:5)  Xrays/Other Studies: ) Results for orders placed or performed during the hospital encounter of 12/17/21 (from the past 48 hour(s))  Glucose, capillary     Status: Abnormal   Collection Time: 12/18/21  8:20 AM  Result Value Ref Range   Glucose-Capillary 223 (H) 70 - 99 mg/dL    Comment: Glucose reference range applies only to samples taken after fasting for at least 8 hours.  Glucose, capillary     Status: Abnormal   Collection Time: 12/18/21 11:54 AM  Result Value Ref Range   Glucose-Capillary 167 (H) 70 - 99 mg/dL    Comment: Glucose reference range applies only to samples taken after fasting for at least 8  hours.  Glucose, capillary     Status: Abnormal   Collection Time: 12/18/21  4:17 PM  Result Value Ref Range   Glucose-Capillary 198 (H) 70 - 99 mg/dL    Comment: Glucose reference range applies only to samples taken after fasting for at least 8 hours.  Glucose, capillary     Status: Abnormal   Collection Time: 12/18/21  9:12 PM  Result Value Ref Range   Glucose-Capillary 185 (H) 70 - 99 mg/dL    Comment: Glucose reference range applies only to samples taken after fasting for at least 8 hours.  Magnesium     Status: None   Collection Time: 12/19/21  6:56 AM  Result Value Ref Range  Magnesium 1.8 1.7 - 2.4 mg/dL    Comment: Performed at [facility], [place] [address], [place], [place] [address]  Basic metabolic panel     Status: Abnormal   Collection Time: 12/19/21  6:56 AM  Result Value Ref Range   Sodium 142 135 - 145 mmol/L   Potassium 4.3 3.5 - 5.1 mmol/L   Chloride 114 (H) 98 - 111 mmol/L   CO2 22 22 - 32 mmol/L   Glucose, Bld 181 (H) 70 - 99 mg/dL    Comment: Glucose reference range applies only to samples taken after fasting for at least 8 hours.   BUN 38 (H) 6 - 20 mg/dL   Creatinine, Ser 2.96 (H) 0.44 - 1.00 mg/dL   Calcium 8.3 (L) 8.9 - 10.3 mg/dL   GFR, Estimated 20 (L) >60 mL/min    Comment: (NOTE) Calculated using the CKD-EPI Creatinine Equation (2021)    Anion gap 6 5 - 15    Comment: Performed at [facility] [address], [place], [place] [address]  CBC     Status: Abnormal   Collection Time: 12/19/21  6:56 AM  Result Value Ref Range   WBC 7.9 4.0 - 10.5 K/uL   RBC 3.05 (L) 3.87 - 5.11 MIL/uL   Hemoglobin 9.5 (L) 12.0 - 15.0 g/dL    Comment: REPEATED TO VERIFY DELTA CHECK NOTED    HCT 28.5 (L) 36.0 - 46.0 %   MCV 93.4 80.0 - 100.0 fL   MCH 31.1 26.0 - 34.0 pg   MCHC 33.3 30.0 - 36.0 g/dL   RDW 14.1 11.5 - 15.5 %   Platelets 152 150 - 400 K/uL   nRBC 0.0 0.0 - 0.2 %    Comment: Performed at [facility], [place] [address],  [place], [place] [address]  Glucose, capillary     Status: Abnormal   Collection Time: 12/19/21  7:56 AM  Result Value Ref Range   Glucose-Capillary 181 (H) 70 - 99 mg/dL    Comment: Glucose reference range applies only to samples taken after fasting for at least 8 hours.  Urinalysis, Routine w reflex microscopic Urine, Clean Catch     Status: Abnormal   Collection Time: 12/19/21  8:41 AM  Result Value Ref Range   Color, Urine YELLOW YELLOW   APPearance HAZY (A) CLEAR   Specific Gravity, Urine 1.016 1.005 - 1.030   pH 5.0 5.0 - 8.0   Glucose, UA 150 (A) NEGATIVE mg/dL   Hgb urine dipstick MODERATE (A) NEGATIVE   Bilirubin Urine NEGATIVE NEGATIVE   Ketones, ur NEGATIVE NEGATIVE mg/dL   Protein, ur >=300 (A) NEGATIVE mg/dL   Nitrite NEGATIVE NEGATIVE   Leukocytes,Ua NEGATIVE NEGATIVE   RBC / HPF 0-5 0 - 5 RBC/hpf   WBC, UA 0-5 0 - 5 WBC/hpf   Bacteria, UA RARE (A) NONE SEEN   Squamous Epithelial / LPF 0-5 0 - 5   Mucus PRESENT    Hyaline Casts, UA PRESENT    Granular Casts, UA PRESENT     Comment: Performed at [facility], [address], [place], [place] [address]  Lactic acid, plasma     Status: None   Collection Time: 12/19/21  9:20 AM  Result Value Ref Range   Lactic Acid, Venous 1.0 0.5 - 1.9 mmol/L    Comment: Performed at [facility] [address] [place], [place] [address]  Hepatic function panel     Status:  Abnormal   Collection Time: 12/19/21  9:20 AM  Result Value Ref Range   Total Protein 5.7 (L) 6.5 - 8.1 g/dL   Albumin 2.6 (L) 3.5 - 5.0 g/dL   AST 20 15 - 41 U/L   ALT 21 0 - 44 U/L   Alkaline Phosphatase 67 38 - 126 U/L   Total Bilirubin 0.5 0.3 - 1.2 mg/dL   Bilirubin, Direct 0.1 0.0 - 0.2 mg/dL   Indirect Bilirubin 0.4 0.3 - 0.9 mg/dL    Comment: Performed at [facility] [address] [place], [place] [address]  Glucose, capillary     Status: Abnormal   Collection Time: 12/19/21 11:26 AM  Result Value Ref Range   Glucose-Capillary 171  (H) 70 - 99 mg/dL    Comment: Glucose reference range applies only to samples taken after fasting for at least 8 hours.  Lactic acid, plasma     Status: None   Collection Time: 12/19/21 11:40 AM  Result Value Ref Range   Lactic Acid, Venous 1.1 0.5 - 1.9 mmol/L    Comment: Performed at [facility] [address] [place], [place] [address]  Basic metabolic panel     Status: Abnormal   Collection Time: 12/19/21  2:51 PM  Result Value Ref Range   Sodium 139 135 - 145 mmol/L   Potassium 4.5 3.5 - 5.1 mmol/L   Chloride 114 (H) 98 - 111 mmol/L   CO2 17 (L) 22 - 32 mmol/L   Glucose, Bld 100 (H) 70 - 99 mg/dL    Comment: Glucose reference range applies only to samples taken after fasting for at least 8 hours.   BUN 37 (H) 6 - 20 mg/dL   Creatinine, Ser 2.88 (H) 0.44 - 1.00 mg/dL   Calcium 8.1 (L) 8.9 - 10.3 mg/dL   GFR, Estimated 20 (L) >60 mL/min    Comment: (NOTE) Calculated using the CKD-EPI Creatinine Equation (2021)    Anion gap 8 5 - 15    Comment: Performed at [facility] [address] [place], [place] [address]  Glucose, capillary     Status: Abnormal   Collection Time: 12/19/21  3:33 PM  Result Value Ref Range   Glucose-Capillary 134 (H) 70 - 99 mg/dL    Comment: Glucose reference range applies only to samples taken after fasting for at least 8 hours.  Glucose, capillary     Status: Abnormal   Collection Time: 12/19/21  9:13 PM  Result Value Ref Range   Glucose-Capillary 204 (H) 70 - 99 mg/dL    Comment: Glucose reference range applies only to samples taken after fasting for at least 8 hours.  CBC     Status: Abnormal   Collection Time: 12/20/21  3:22 AM  Result Value Ref Range   WBC 10.1 4.0 - 10.5 K/uL   RBC 3.15 (L) 3.87 - 5.11 MIL/uL   Hemoglobin 10.0 (L) 12.0 - 15.0 g/dL   HCT 30.1 (L) 36.0 - 46.0 %   MCV 95.6 80.0 - 100.0 fL   MCH 31.7 26.0 - 34.0 pg   MCHC 33.2 30.0 - 36.0 g/dL   RDW 14.3 11.5 - 15.5 %   Platelets 186 150 - 400 K/uL   nRBC 0.0  0.0 - 0.2 %    Comment: Performed at [facility], [place] [address] [place], [place] [address]  Basic metabolic panel     Status: Abnormal   Collection Time: 12/20/21  3:22  AM  Result Value Ref Range   Sodium 136 135 - 145 mmol/L   Potassium 4.5 3.5 - 5.1 mmol/L   Chloride 113 (H) 98 - 111 mmol/L   CO2 18 (L) 22 - 32 mmol/L   Glucose, Bld 191 (H) 70 - 99 mg/dL    Comment: Glucose reference range applies only to samples taken after fasting for at least 8 hours.   BUN 45 (H) 6 - 20 mg/dL   Creatinine, Ser 3.38 (H) 0.44 - 1.00 mg/dL   Calcium 7.9 (L) 8.9 - 10.3 mg/dL   GFR, Estimated 17 (L) >60 mL/min    Comment: (NOTE) Calculated using the CKD-EPI Creatinine Equation (2021)    Anion gap 5 5 - 15    Comment: Performed at [facility] [address], [place], [place] [address]   NM Hepato W/EF  Result Date: 12/18/2021 CLINICAL DATA:  Abdominal pain, upper, chronic, assess gallbladder motility EXAM: NUCLEAR MEDICINE HEPATOBILIARY IMAGING WITH GALLBLADDER EF TECHNIQUE: Sequential images of the abdomen were obtained out to 60 minutes following intravenous administration of radiopharmaceutical. After oral ingestion of Ensure, gallbladder ejection fraction was determined. At 60 min, normal ejection fraction is greater than 33%. RADIOPHARMACEUTICALS:  5.2 mCi Tc-25m Choletec IV COMPARISON:  None Available. FINDINGS: Prompt uptake and biliary excretion of activity by the liver is seen. Gallbladder activity is visualized, consistent with patency of cystic duct. Biliary activity passes into small bowel, consistent with patent common bile duct. Calculated gallbladder ejection fraction is 17%. (Normal gallbladder ejection fraction with Ensure is greater than 33%.) IMPRESSION: Decreased gallbladder ejection fraction consistent with biliary dyskinesia. Electronically Signed   By: SValentino SaxonM.D.   On: 12/18/2021 15:02    PMH:   Past Medical History:  Diagnosis Date   Arthritis     GERD (gastroesophageal reflux disease)    Headache    migraine 2 weeks ago   Hyperlipidemia    Hy[place]ension associated with diabetes ([facility]    Renal disorder    has both kidneys, compacted and 'squashed together"   Scoliosis    Sleep apnea    in the midst of being tested for it.   waiting on scheduling    Type 2 diabetes mellitus treated with insulin ([facility]    dx 2000    PSH:   Past Surgical History:  Procedure Laterality Date   BACK SURGERY     CERVICAL FUSION     frontal closed sutures to forehead     Harrington Rod placement     HIP SURGERY     staph infection   LUMBAR FUSION     RADIOLOGY WITH ANESTHESIA N/A 02/25/2017   Procedure: RADIOLOGY WITH ANESTHESIA/MRI THORACIC SPINE AND LEFT SHOULDER WITHOUT CONTRAST.;  Surgeon: Radiologist, Medication, MD;  [facility]  Service: Radiology;  Laterality: N/A;    Allergies:  Allergies  Allergen Reactions   Hydrocodone Itching    Note: benadryl does NOT help   Lisinopril Nausea And Vomiting    Caused nausea   Metformin And Related Other (See Comments)    Causes blisters on skin   Norvasc [Amlodipine Besylate] Swelling    Ankle swelling   Ultrasound Gel Rash and Other (See Comments)    Patient gets a red rash with use of ultrasound gel/ burns skin    Medications:   Prior to Admission medications   Medication Sig Start Date End Date Taking? Authorizing Provider  acetaminophen (TYLENOL) 650 MG CR tablet Take 650 mg by mouth  every 8 (eight) hours as needed for pain.   Yes [provider]  albuterol (PROVENTIL HFA;VENTOLIN HFA) 108 (90 Base) MCG/ACT inhaler Inhale 1 puff into the lungs every 6 (six) hours as needed for wheezing or shortness of breath. 10/04/18  Yes [name], MD  amitriptyline (ELAVIL) 25 MG tablet TAKE 1 TABLET(25 MG) BY MOUTH AT BEDTIME 10/29/21  Yes Ladell Pier, MD  atorvastatin (LIPITOR) 10 MG tablet Take 1 tablet (10 mg total) by mouth daily. 09/15/18  Yes Ladell Pier, MD   busPIRone (BUSPAR) 10 MG tablet TAKE 1 TABLET(10 MG) BY MOUTH THREE TIMES DAILY 11/24/20  Yes Ladell Pier, MD  carvedilol (COREG) 12.5 MG tablet Take 1 tablet (12.5 mg total) by mouth 2 (two) times daily with a meal. 09/06/20  Yes Ladell Pier, MD  D3-50 1.25 MG (50000 UT) capsule Take 50,000 Units by mouth once a week. 09/03/21  Yes [provider]  escitalopram (LEXAPRO) 20 MG tablet TAKE 1 TABLET(20 MG) BY MOUTH DAILY 11/12/20  Yes Ladell Pier, MD  ferrous sulfate 325 (65 FE) MG tablet Take 1 tablet (325 mg total) by mouth daily with breakfast. 07/05/21  Yes Ladell Pier, MD  gabapentin (NEURONTIN) 400 MG capsule Take 400 mg by mouth 3 (three) times daily.   Yes [provider]  hydrALAZINE (APRESOLINE) 10 MG tablet Take 1 tablet (10 mg total) by mouth 2 (two) times daily. 07/14/21  Yes Ladell Pier, MD  hydrochlorothiazide (HYDRODIURIL) 25 MG tablet Take 1 tablet (25 mg total) by mouth daily. 06/11/19  Yes Ladell Pier, MD  Insulin Glargine (LANTUS SOLOSTAR) 100 UNIT/ML Solostar Pen Inject 38 Units into the skin daily. 09/15/18  Yes Ladell Pier, MD  NOVOLOG FLEXPEN 100 UNIT/ML FlexPen INJECT 15 UNITS INTO THE SKIN WITH THE TWO LARGEST MEALS OF THE DAY. 11/27/18  Yes Ladell Pier, MD  olmesartan (BENICAR) 20 MG tablet Take 20 mg by mouth daily. 09/26/21  Yes [provider]  Oxycodone HCl 10 MG TABS Take 10 mg by mouth 5 (five) times daily. 06/10/21  Yes [provider]  tiZANidine (ZANAFLEX) 4 MG tablet TAKE 1 TABLET BY MOUTH EVERY 8 HOURS AS NEEDED Patient taking differently: Take 4 mg by mouth every 8 (eight) hours as needed for muscle spasms. 02/21/20  Yes Ladell Pier, MD  Vitamin D, Ergocalciferol, (DRISDOL) 1.25 MG (50000 UNIT) CAPS capsule Take 1 capsule (50,000 Units total) by mouth every 7 (seven) days. 02/28/21  Yes Ladell Pier, MD  ACCU-CHEK SOFTCLIX LANCETS lancets 1 each by Other route 3 (three)  times daily. ICD 10 E11.9 09/21/15   [name], MD  Accu-Chek Softclix Lancets lancets Use as instructed 01/02/21   Ladell Pier, MD  Blood Glucose Monitoring Suppl (ACCU-CHEK AVIVA PLUS) w/Device KIT 1 Device by Does not apply route 3 (three) times daily after meals. ICD 10 E11.9 09/21/15   [name], MD  Blood Glucose Monitoring Suppl (ACCU-CHEK GUIDE) w/Device KIT Use as directed 01/02/21   Ladell Pier, MD  glucose blood (ACCU-CHEK AVIVA PLUS) test strip 1 each by Other route 3 (three) times daily. ICD 10 E11.9 09/21/15   [name], MD  glucose blood (ACCU-CHEK GUIDE) test strip Test 3x/day before meals 01/02/21   Ladell Pier, MD  Insulin Pen Needle (B-D ULTRAFINE III SHORT PEN) 31G X 8 MM MISC 1 application by Does not apply route daily. 01/02/17   [name], MD  Lancet Devices (ACCU-CHEK SOFTCLIX) lancets 1 each by Other route 3 (three) times daily. ICD 10 E11.9 09/21/15   Funches, Adriana Mccallum, MD  ondansetron (ZOFRAN ODT) 4 MG disintegrating tablet Take 1 tablet (4 mg total) by mouth every 8 (eight) hours as needed. Patient not taking: Reported on 12/17/2021 03/11/19   Marcial Pacas, MD  sitaGLIPtin (JANUVIA) 100 MG tablet TAKE 1 TABLET (100 MG TOTAL) BY MOUTH DAILY. 08/11/20 08/11/21  Ladell Pier, MD  SUMAtriptan (IMITREX) 100 MG tablet Take 1 tablet (100 mg total) by mouth once as needed for up to 1 dose for migraine. May repeat in 2 hours if headache persists or recurs. Patient not taking: Reported on 12/17/2021 03/11/19   Marcial Pacas, MD    Discontinued Meds:   Medications Discontinued During This Encounter  Medication Reason   acetaminophen (TYLENOL) tablet 1,000 mg    triamcinolone cream (KENALOG) 0.1 %    Erenumab-aooe (AIMOVIG) 70 MG/ML SOAJ    methocarbamol (ROBAXIN) 750 MG tablet    norethindrone (MICRONOR) 0.35 MG tablet    pregabalin (LYRICA) 25 MG capsule    enoxaparin (LOVENOX) injection 30 mg    morphine (PF) 2 MG/ML injection 2 mg     carvedilol (COREG) tablet 12.5 mg    hydrALAZINE (APRESOLINE) tablet 10 mg    irbesartan (AVAPRO) tablet 150 mg     Social History:  reports that she has never smoked. She has never used smokeless tobacco. She reports that she does not drink alcohol and does not use drugs.  Family History:   Family History  Problem Relation Age of Onset   Cancer Maternal Grandmother        mandibular cancer    Diabetes Mother    Heart failure Mother    CAD Mother        triple CABG   Diabetes Maternal Aunt    Stroke Father     Blood pressure (!) 94/53, pulse 76, temperature (!) 97.3 F (36.3 C), temperature source Oral, resp. rate 10, SpO2 97 %. General appearance: alert, cooperative, and appears stated age Head: Normocephalic, without obvious abnormality, atraumatic Eyes: negative Cardio: regular rate and rhythm GI: very tender RUQ Extremities: edema 3+ Pulses: 2+ and symmetric Skin: Skin color, texture, turgor normal. No rashes or lesions       Asberry Lascola, Hunt Oris, MD 12/20/2021, 7:56 AM

## 2021-12-20 NOTE — Op Note (Signed)
Operative Note  Briana Williams 41 y.o. female [id number]  12/20/2021  Surgeon: Clovis Riley MD FACS  Assistant: Nicanor Alcon MD ([place]) I was personally present during the key and critical portions of this procedure and immediately available throughout the entire procedure, as documented in my operative note.  Procedure performed: Laparoscopic Cholecystectomy  Procedure classification: urgent  Preop diagnosis: biliary dyskinesia Post-op diagnosis/intraop findings: same, cholecystitis  Specimens: gallbladder  Retained items: none  EBL: minimal  Complications: none  Description of procedure: After obtaining informed consent the patient was brought to the operating room. Antibiotics were administered. SCD's were applied. General endotracheal anesthesia was initiated and a formal time-out was performed. The abdomen was prepped and draped in the usual sterile fashion and the abdomen was entered using a supra-umbilical veress needle after instilling the site with local. Insufflation to 40mmHg was obtained, 62mm trocar and camera inserted, and gross inspection revealed no evidence of injury from our entry or other intraabdominal abnormalities. Two 8mm trocars were introduced in the right midclavicular and right anterior axillary lines under direct visualization and following infiltration with local. An 29mm trocar was placed in the epigastrium. The gallbladder was retracted cephalad and the infundibulum was retracted laterally. A combination of hook electrocautery and blunt dissection was utilized to clear the peritoneum from the neck and cystic duct, circumferentially isolating the cystic artery and cystic duct and lifting the gallbladder from the cystic plate. The critical view of safety was achieved with the cystic artery, cystic duct, and liver bed visualized between them with no other structures. The artery was clipped with a single clip proximally and distally and divided as was the  cystic duct with three clips on the proximal end.  There was a small granular stone noted in the cystic duct the gallbladder was dissected from the liver plate using electrocautery. Once freed the gallbladder was placed in an endocatch bag and removed through the epigastric trocar site. A small amount of bleeding on the liver bed was controlled with cautery. Some bile had been spilled from the gallbladder during its dissection from the liver bed. This was aspirated and the right upper quadrant was irrigated copiously until the effluent was clear. Hemostasis was once again confirmed, and reinspection of the abdomen revealed no injuries. The clips were well opposed without any bile leak from the duct or the liver bed. The 31mm trocar site in the epigastrium was closed with a 0 vicryl in the fascia under direct visualization using a PMI device. The abdomen was desufflated and all trocars removed. The skin incisions were closed with subcuticular monocryl and Dermabond. The patient was awakened, extubated and transported to the recovery room in stable condition.    All counts were correct at the completion of the case.

## 2021-12-20 NOTE — Progress Notes (Signed)
1130- report called to preop. Patient transferred down to preop for procedure.

## 2021-12-20 NOTE — Progress Notes (Signed)
Progress Note     Subjective: RUQ abdominal pain. Nausea and vomiting overnight. Cr trending up but nephrology has cleared for surgery.   Objective: Vital signs in last 24 hours: Temp:  [97.3 F (36.3 C)-98 F (36.7 C)] 97.7 F (36.5 C) (05/25 0802) Pulse Rate:  [57-76] 69 (05/25 0802) Resp:  [10-19] 16 (05/25 0802) BP: (94-150)/(53-78) 138/62 (05/25 0802) SpO2:  [97 %-98 %] 97 % (05/25 0802)    Intake/Output from previous day: 05/24 0701 - 05/25 0700 In: 1811.1 [I.V.:1811.1] Out: 500 [Urine:500] Intake/Output this shift: No intake/output data recorded.  PE: Gen:  Alert, NAD Card:  RRR Pulm:  rate and effort normal on room air Abd: obese, soft, +BS, no masses, hernias, or organomegaly. Diffuse tenderness, somewhat hypersensitive to touch, more moderate TTP RUQ/epigastric region   Lab Results:  Recent Labs    12/19/21 0656 12/20/21 0322  WBC 7.9 10.1  HGB 9.5* 10.0*  HCT 28.5* 30.1*  PLT 152 186   BMET Recent Labs    12/19/21 1451 12/20/21 0322  NA 139 136  K 4.5 4.5  CL 114* 113*  CO2 17* 18*  GLUCOSE 100* 191*  BUN 37* 45*  CREATININE 2.88* 3.38*  CALCIUM 8.1* 7.9*   PT/INR No results for input(s): LABPROT, INR in the last 72 hours. CMP     Component Value Date/Time   NA 136 12/20/2021 0322   NA 141 06/27/2021 1224   K 4.5 12/20/2021 0322   CL 113 (H) 12/20/2021 0322   CO2 18 (L) 12/20/2021 0322   GLUCOSE 191 (H) 12/20/2021 0322   BUN 45 (H) 12/20/2021 0322   BUN 32 (H) 06/27/2021 1224   CREATININE 3.38 (H) 12/20/2021 0322   CREATININE 2.55 (H) 07/19/2021 1337   CREATININE 0.59 09/21/2015 1740   CALCIUM 7.9 (L) 12/20/2021 0322   PROT 5.7 (L) 12/19/2021 0920   PROT 7.0 08/11/2020 1632   ALBUMIN 2.6 (L) 12/19/2021 0920   ALBUMIN 3.9 08/11/2020 1632   AST 20 12/19/2021 0920   AST 16 07/19/2021 1337   ALT 21 12/19/2021 0920   ALT 16 07/19/2021 1337   ALKPHOS 67 12/19/2021 0920   BILITOT 0.5 12/19/2021 0920   BILITOT 0.4 07/19/2021  1337   GFRNONAA 17 (L) 12/20/2021 0322   GFRNONAA 24 (L) 07/19/2021 1337   GFRNONAA >89 09/21/2015 1740   GFRAA 54 (L) 08/11/2020 1632   GFRAA >89 09/21/2015 1740   Lipase     Component Value Date/Time   LIPASE 90 (H) 12/18/2021 0315       Studies/Results: NM Hepato W/EF  Result Date: 12/18/2021 CLINICAL DATA:  Abdominal pain, upper, chronic, assess gallbladder motility EXAM: NUCLEAR MEDICINE HEPATOBILIARY IMAGING WITH GALLBLADDER EF TECHNIQUE: Sequential images of the abdomen were obtained out to 60 minutes following intravenous administration of radiopharmaceutical. After oral ingestion of Ensure, gallbladder ejection fraction was determined. At 60 min, normal ejection fraction is greater than 33%. RADIOPHARMACEUTICALS:  5.2 mCi Tc-63m  Choletec IV COMPARISON:  None Available. FINDINGS: Prompt uptake and biliary excretion of activity by the liver is seen. Gallbladder activity is visualized, consistent with patency of cystic duct. Biliary activity passes into small bowel, consistent with patent common bile duct. Calculated gallbladder ejection fraction is 17%. (Normal gallbladder ejection fraction with Ensure is greater than 33%.) IMPRESSION: Decreased gallbladder ejection fraction consistent with biliary dyskinesia. Electronically Signed   By: Valentino Saxon M.D.   On: 12/18/2021 15:02    Anti-infectives: Anti-infectives (From admission, onward)    Start  Dose/Rate Route Frequency Ordered Stop   12/20/21 1145  cefTRIAXone (ROCEPHIN) 2 g in sodium chloride 0.9 % 100 mL IVPB        2 g 200 mL/hr over 30 Minutes Intravenous  Once 12/20/21 1046          Assessment/Plan Epigastric/RUQ abdominal pain Gallstones Biliary dyskinesia - CT renal stone study negative for intraabdominal pathology - RUQ u/s shows gallstones but no signs of cholecystitis - HIDA negative for cholecystitis, EF 17%  - Continues to have abdominal pain. Reasonable to consider laparoscopic cholecystectomy  given biliary dyskinesia on HIDA, although I discussed with the patient and her husband that there is no guarantee that this will fix her abdominal pain. They are in agreement to proceed.  - Cr up further today but nephrology has cleared for surgery and will follow post-op  - will proceed to OR today for lap chole  ID -  rocephin ordered  VTE - lovenox FEN - NPO  Foley - none   Hypertensive urgency  HTN HLD DM Obesity Chronic kidney disease Chronic pain   LOS: 2 days     [name], [facility] 12/20/2021, 10:46 AM Please see Amion for pager number during day hours 7:00am-4:30pm

## 2021-12-20 NOTE — Progress Notes (Signed)
PROGRESS NOTE    [name]  KTG:[id number] DOB: 25-Apr-1981 DOA: 12/17/2021 PCP: Ladell Pier, MD   Brief Narrative: Briana Williams is a 41 y.o. female with a history of diabetes, hypertension, scoliosis, insomnia, depression, chronic pain, hyperlipidemia, obesity, migraines, anemia who presented secondary to chest pain, palpitations, abdominal pain and shortness of breath. During admission she was also found to have a possible AKI, hypertensive urgency and nausea/vomiting. Concern for possible biliary etiology for nausea/vomiting. Hypertensive urgency secondary to nausea/vomiting and resultant medication nonadherence. General surgery consulted. HIDA scan significant for biliary dyskinesia.   Assessment and Plan:  Hypertensive urgency Primary hypertension Hypertensive urgency in setting of nausea/vomiting and inability to manage oral intake. Improved with resumption of home medications but now with hypotension.  Hypotension Unsure of etiology. Does not appear to be sepsis related. Possibly iatrogenic secondary to resumption of home medications. 1 L NS bolus x2 given with improvement of blood pressure. Home antihypertensives discontinued -Continue MIV fluids  Chest pain Resolved.  Palpitations Patient appears to be having infrequent PVCs. -Monitor on telemetry  AKI on CKD stage IIIa Patient with AKI vs worsening CKD. Creatinine of 2.65 on admission and has trended down slightly. Most recent baseline appears to be between 1.8-2.55. Creatinine continues to worsen, likely related to hypotension. -Urinalysis, urine sodium/creatinine, renal US -Continue IV fluids -Nephrology consult  Abdominal pain Nausea and vomiting Concern for gallbladder pathology. Normal LFTs. Cholelithiasis confirmed on imaging and HIDA significant for decreased gallbladder ejection fracture consistent with biliary dyskinesia. General surgery plan for cholecystectomy this admission if  able.  Diabetes mellitus, type 2 Patient is on insulin glargine 38 units daily and Novolog 15 units BID as an outpatient -Continue SSI while inpatient  Depression -Continue Elavil, Buspar and Lexapro  Chronic pain -Continue oxycodone PRN while able to take PO -Continue morphine PRN while unable to take PO  Hyperlipidemia -Continue Lipitor  Anemia Recent iron panel was unremarkable for etiology. Anemia is mild. Patient has a history of irregular menstrual cycle. Stable hemoglobin.   DVT prophylaxis: Lovenox Code Status:   Code Status: Full Code Family Communication: Husband at bedside Disposition Plan: Discharge home pending general surgery recommendations/management in addition to improvement of renal function.   Consultants:  General surgery Nephrology  Procedures:  None  Antimicrobials: None    Subjective: Patient continues to have persistent right sided abdominal pain. No nausea/vomiting. Still eager to go home.  Objective: BP (!) 94/53 (BP Location: Right Arm)   Pulse 76   Temp (!) 97.3 F (36.3 C) (Oral)   Resp 10   SpO2 97%   Examination:  General exam: Appears calm and comfortable Respiratory system: Clear to auscultation. Respiratory effort normal. Cardiovascular system: S1 & S2 heard, RRR. No murmurs, rubs, gallops or clicks. Gastrointestinal system: Abdomen is nondistended, soft with right sided fullness and tenderness. Normal bowel sounds heard. Central nervous system: Alert and oriented. No focal neurological deficits. Musculoskeletal: LE edema. No calf tenderness Skin: No cyanosis. No rashes Psychiatry: Judgement and insight appear normal. Mood & affect appropriate.    Data Reviewed: I have personally reviewed following labs and imaging studies  CBC Lab Results  Component Value Date   WBC 10.1 12/20/2021   RBC 3.15 (L) 12/20/2021   HGB 10.0 (L) 12/20/2021   HCT 30.1 (L) 12/20/2021   MCV 95.6 12/20/2021   MCH 31.7 12/20/2021   PLT 186  12/20/2021   MCHC 33.2 12/20/2021   RDW 14.3 12/20/2021   LYMPHSABS 2.5 12/17/2021  MONOABS 0.6 12/17/2021   EOSABS 0.2 12/17/2021   BASOSABS 0.1 94/17/4081     Last metabolic panel Lab Results  Component Value Date   NA 136 12/20/2021   K 4.5 12/20/2021   CL 113 (H) 12/20/2021   CO2 18 (L) 12/20/2021   BUN 45 (H) 12/20/2021   CREATININE 3.38 (H) 12/20/2021   GLUCOSE 191 (H) 12/20/2021   GFRNONAA 17 (L) 12/20/2021   GFRAA 54 (L) 08/11/2020   CALCIUM 7.9 (L) 12/20/2021   PROT 5.7 (L) 12/19/2021   ALBUMIN 2.6 (L) 12/19/2021   LABGLOB 3.5 07/19/2021   AGRATIO 1.3 08/11/2020   BILITOT 0.5 12/19/2021   ALKPHOS 67 12/19/2021   AST 20 12/19/2021   ALT 21 12/19/2021   ANIONGAP 5 12/20/2021    GFR: CrCl cannot be calculated (Unknown ideal weight.).  No results found for this or any previous visit (from the past 240 hour(s)).    Radiology Studies: NM Hepato W/EF  Result Date: 12/18/2021 CLINICAL DATA:  Abdominal pain, upper, chronic, assess gallbladder motility EXAM: NUCLEAR MEDICINE HEPATOBILIARY IMAGING WITH GALLBLADDER EF TECHNIQUE: Sequential images of the abdomen were obtained out to 60 minutes following intravenous administration of radiopharmaceutical. After oral ingestion of Ensure, gallbladder ejection fraction was determined. At 60 min, normal ejection fraction is greater than 33%. RADIOPHARMACEUTICALS:  5.2 mCi Tc-65m  Choletec IV COMPARISON:  None Available. FINDINGS: Prompt uptake and biliary excretion of activity by the liver is seen. Gallbladder activity is visualized, consistent with patency of cystic duct. Biliary activity passes into small bowel, consistent with patent common bile duct. Calculated gallbladder ejection fraction is 17%. (Normal gallbladder ejection fraction with Ensure is greater than 33%.) IMPRESSION: Decreased gallbladder ejection fraction consistent with biliary dyskinesia. Electronically Signed   By: Valentino Saxon M.D.   On: 12/18/2021 15:02       LOS: 2 days    Cordelia Poche, MD [facility] 12/20/2021, 7:19 AM   If 7PM-7AM, please contact night-coverage [email or web address]

## 2021-12-20 NOTE — Anesthesia Procedure Notes (Signed)
Procedure Name: Intubation Date/Time: 12/20/2021 1:38 PM Performed by: Carolan Clines, CRNA Pre-anesthesia Checklist: Patient identified, Emergency Drugs available, Suction available and Patient being monitored Patient Re-evaluated:Patient Re-evaluated prior to induction Oxygen Delivery Method: Circle System Utilized Preoxygenation: Pre-oxygenation with 100% oxygen Induction Type: IV induction Ventilation: Mask ventilation without difficulty Laryngoscope Size: Glidescope and 3 Grade View: Grade I Tube type: Oral Tube size: 7.0 mm Number of attempts: 1 Airway Equipment and Method: Rigid stylet and Video-laryngoscopy Placement Confirmation: ETT inserted through vocal cords under direct vision, positive ETCO2 and breath sounds checked- equal and bilateral Secured at: 22 cm Tube secured with: Tape Dental Injury: Teeth and Oropharynx as per pre-operative assessment  Difficulty Due To: Difficulty was anticipated and Difficult Airway- due to reduced neck mobility Comments: Elective glidescope d/t reduced/fixed neck mobility from previous cervical fusion.

## 2021-12-21 ENCOUNTER — Other Ambulatory Visit (HOSPITAL_COMMUNITY): Payer: Self-pay

## 2021-12-21 ENCOUNTER — Encounter (HOSPITAL_COMMUNITY): Payer: Self-pay | Admitting: Surgery

## 2021-12-21 ENCOUNTER — Other Ambulatory Visit: Payer: Self-pay

## 2021-12-21 DIAGNOSIS — R109 Unspecified abdominal pain: Secondary | ICD-10-CM | POA: Diagnosis not present

## 2021-12-21 DIAGNOSIS — I160 Hypertensive urgency: Secondary | ICD-10-CM | POA: Diagnosis not present

## 2021-12-21 DIAGNOSIS — N179 Acute kidney failure, unspecified: Secondary | ICD-10-CM | POA: Diagnosis not present

## 2021-12-21 DIAGNOSIS — E1129 Type 2 diabetes mellitus with other diabetic kidney complication: Secondary | ICD-10-CM | POA: Diagnosis not present

## 2021-12-21 DIAGNOSIS — K810 Acute cholecystitis: Secondary | ICD-10-CM

## 2021-12-21 DIAGNOSIS — Z794 Long term (current) use of insulin: Secondary | ICD-10-CM | POA: Diagnosis not present

## 2021-12-21 DIAGNOSIS — I129 Hypertensive chronic kidney disease with stage 1 through stage 4 chronic kidney disease, or unspecified chronic kidney disease: Secondary | ICD-10-CM | POA: Diagnosis not present

## 2021-12-21 DIAGNOSIS — D631 Anemia in chronic kidney disease: Secondary | ICD-10-CM | POA: Diagnosis not present

## 2021-12-21 DIAGNOSIS — N1832 Chronic kidney disease, stage 3b: Secondary | ICD-10-CM | POA: Diagnosis not present

## 2021-12-21 DIAGNOSIS — R112 Nausea with vomiting, unspecified: Secondary | ICD-10-CM | POA: Diagnosis not present

## 2021-12-21 LAB — BASIC METABOLIC PANEL
Anion gap: 7 (ref 5–15)
BUN: 46 mg/dL — ABNORMAL HIGH (ref 6–20)
CO2: 17 mmol/L — ABNORMAL LOW (ref 22–32)
Calcium: 8.1 mg/dL — ABNORMAL LOW (ref 8.9–10.3)
Chloride: 115 mmol/L — ABNORMAL HIGH (ref 98–111)
Creatinine, Ser: 2.82 mg/dL — ABNORMAL HIGH (ref 0.44–1.00)
GFR, Estimated: 21 mL/min — ABNORMAL LOW (ref >60)
Glucose, Bld: 248 mg/dL — ABNORMAL HIGH (ref 70–99)
Potassium: 4.3 mmol/L (ref 3.5–5.1)
Sodium: 139 mmol/L (ref 135–145)

## 2021-12-21 LAB — SURGICAL PATHOLOGY

## 2021-12-21 LAB — GLUCOSE, CAPILLARY: Glucose-Capillary: 240 mg/dL — ABNORMAL HIGH (ref 70–99)

## 2021-12-21 MED ORDER — ONDANSETRON 4 MG PO TBDP
4.0000 mg | ORAL_TABLET | Freq: Three times a day (TID) | ORAL | 6 refills | Status: DC | PRN
  Filled 2021-12-21: qty 20, 7d supply, fill #0

## 2021-12-21 MED ORDER — OXYCODONE HCL 10 MG PO TABS
ORAL_TABLET | ORAL | 0 refills | Status: DC
  Filled 2021-12-21 – 2021-12-26 (×2): qty 75, 15d supply, fill #0

## 2021-12-21 MED ORDER — SITAGLIPTIN PHOSPHATE 25 MG PO TABS
25.0000 mg | ORAL_TABLET | Freq: Every day | ORAL | 0 refills | Status: DC
  Filled 2021-12-21: qty 30, 30d supply, fill #0

## 2021-12-21 MED ORDER — GABAPENTIN 400 MG PO CAPS: 400.0000 mg | ORAL_CAPSULE | Freq: Two times a day (BID) | ORAL | Status: DC

## 2021-12-21 MED ORDER — OXYCODONE HCL 5 MG PO TABS
5.0000 mg | ORAL_TABLET | Freq: Four times a day (QID) | ORAL | 0 refills | Status: DC | PRN
  Filled 2021-12-21: qty 20, 5d supply, fill #0

## 2021-12-21 NOTE — TOC Benefit Eligibility Note (Signed)
Patient Advocate Encounter  Prior Authorization for Januvia 25MG  tablets has been approved.    PA# [id number] Effective dates: 12/21/2021 through 12/21/2022      Lyndel Safe, [place] Patient Advocate Specialist [place] Patient Advocate Team Direct Number: [phone]  Fax: [phone]

## 2021-12-21 NOTE — Discharge Summary (Signed)
Physician Discharge Summary   Patient: Briana Williams MRN: [id number] DOB: [birth year]  Admit date:     12/17/2021  Discharge date: 12/21/21  Discharge Physician: Cordelia Poche, MD   PCP: Ladell Pier, MD   Recommendations at discharge:  General surgery follow-up PCP follow-up Nephrology follow-up for labs (BMP) Titrate antihypertension medications as blood pressure and kidney function allow Titrate gabapentin and Januvia as kidney function allows  Discharge Diagnoses: Active Problems:   HYPERTRIGLYCERIDEMIA   Depression   Essential hypertension, benign   Idiopathic scoliosis and kyphoscoliosis   Insomnia   Chronic pain syndrome   Morbid obesity (HCC)   Acute renal failure superimposed on chronic kidney disease (HCC)   Migraines   Anemia in chronic kidney disease   Acute cholecystitis  Principal Problem (Resolved):   Hypertensive urgency  Hospital Course: Briana Williams is a 41 y.o. female with a history of diabetes, hypertension, scoliosis, insomnia, depression, chronic pain, hyperlipidemia, obesity, migraines, anemia who presented secondary to chest pain, palpitations, abdominal pain and shortness of breath. During admission she was also found to have a possible AKI, hypertensive urgency and nausea/vomiting. Concern for possible biliary etiology for nausea/vomiting. Hypertensive urgency secondary to nausea/vomiting and resultant medication nonadherence. General surgery consulted. HIDA scan significant for biliary dyskinesia. Patient underwent laparoscopic cholecystectomy on 5/25. Hospitalization also complicated by AKI which was improving prior to discharge.  Assessment and Plan: Hypertensive urgency Primary hypertension Hypertensive urgency in setting of nausea/vomiting and inability to manage oral intake. Improved with resumption of home medications but then developed hypotension which has now resolved. Resume home hydralazine and Coreg. Hold home  hydrochlorothiazide and olmesartan until kidney function has stabilized.    Hypotension, resolved Unsure of etiology. Does not appear to be sepsis related. Possibly iatrogenic secondary to resumption of home medications. 1 L NS bolus x2 given with improvement of blood pressure. Home antihypertensives discontinued with improvement of blood pressure. Blood pressure now elevate s/p cholecystectomy.   Chest pain Resolved. Not cardiac related.   Palpitations Patient appears to be having infrequent PVCs. Continue Coreg.   AKI on CKD stage IIIa Patient with AKI vs worsening CKD. Creatinine of 2.65 on admission and has trended down slightly. Most recent baseline appears to be between 1.8-2.55. Creatinine continues to worsen, likely related to hypotension. Renal ultrasound without evidence of renal disease/other etiology. Creatinine improved prior to discharge. Creatinine of 2.82 on day of discharge. Nephrology recommending outpatient follow-up and repeat labs.   Abdominal pain Nausea and vomiting Acute cholecystitis Initial concern for gallbladder pathology. Normal LFTs. Cholelithiasis confirmed on imaging and HIDA significant for decreased gallbladder ejection fracture consistent with biliary dyskinesia. General surgery performed cholecystectomy on 5/25. Patient to follow-up with general surgery as an outpatient.   Diabetes mellitus, type 2 Uncontrolled with hyperglycemia. Patient is on Januvia insulin glargine 38 units daily and Novolog 15 units BID as an outpatient. Resume home regimen, except decrease to Januvia 25 mg daily to account for renal dysfunction.   Depression Continue Elavil, Buspar and Lexapro   Chronic pain Continue oxycodone PRN and gabapentin. Gabapentin decreased to 400 mg BID to account for renal dysfunction.   Hyperlipidemia Continue Lipitor   Anemia Recent iron panel was unremarkable for etiology. Anemia is mild. Patient has a history of irregular menstrual cycle.  Stable hemoglobin.  Morbid obesity Body mass index is 53.46 kg/m.   Consultants: General surgery, Nephrology Procedures performed: Cholecystectomy  Disposition: Home Diet recommendation: Carb modified diet  DISCHARGE MEDICATION: Allergies as of 12/21/2021  Reactions   Hydrocodone Itching   Note: benadryl does NOT help   Lisinopril Nausea And Vomiting   Caused nausea   Metformin And Related Other (See Comments)   Causes blisters on skin   Norvasc [amlodipine Besylate] Swelling   Ankle swelling   Other Other (See Comments)   Paper Tape causes Blisters   Ultrasound Gel Rash, Other (See Comments)   Patient gets a red rash with use of ultrasound gel/ burns skin        Medication List     STOP taking these medications    hydrochlorothiazide 25 MG tablet Commonly known as: HYDRODIURIL   olmesartan 20 MG tablet Commonly known as: BENICAR       TAKE these medications    Accu-Chek Aviva Plus w/Device Kit 1 Device by Does not apply route 3 (three) times daily after meals. ICD 10 E11.9   Accu-Chek Guide w/Device Kit Use as directed   accu-chek softclix lancets 1 each by Other route 3 (three) times daily. ICD 10 E11.9   Accu-Chek Softclix Lancets lancets 1 each by Other route 3 (three) times daily. ICD 10 E11.9   Accu-Chek Softclix Lancets lancets Use as instructed   acetaminophen 650 MG CR tablet Commonly known as: TYLENOL Take 650 mg by mouth every 8 (eight) hours as needed for pain.   albuterol 108 (90 Base) MCG/ACT inhaler Commonly known as: VENTOLIN HFA Inhale 1 puff into the lungs every 6 (six) hours as needed for wheezing or shortness of breath.   amitriptyline 25 MG tablet Commonly known as: ELAVIL TAKE 1 TABLET(25 MG) BY MOUTH AT BEDTIME   atorvastatin 10 MG tablet Commonly known as: LIPITOR Take 1 tablet (10 mg total) by mouth daily.   busPIRone 10 MG tablet Commonly known as: BUSPAR TAKE 1 TABLET(10 MG) BY MOUTH THREE TIMES DAILY    carvedilol 12.5 MG tablet Commonly known as: COREG Take 1 tablet (12.5 mg total) by mouth 2 (two) times daily with a meal.   D3-50 1.25 MG (50000 UT) capsule Generic drug: Cholecalciferol Take 50,000 Units by mouth once a week.   escitalopram 20 MG tablet Commonly known as: LEXAPRO TAKE 1 TABLET(20 MG) BY MOUTH DAILY   ferrous sulfate 325 (65 FE) MG tablet Take 1 tablet (325 mg total) by mouth daily with breakfast.   gabapentin 400 MG capsule Commonly known as: NEURONTIN Take 1 capsule (400 mg total) by mouth 2 (two) times daily. What changed: when to take this   glucose blood test strip Commonly known as: Accu-Chek Aviva Plus 1 each by Other route 3 (three) times daily. ICD 10 E11.9   Accu-Chek Guide test strip Generic drug: glucose blood Test 3x/day before meals   hydrALAZINE 10 MG tablet Commonly known as: APRESOLINE Take 1 tablet (10 mg total) by mouth 2 (two) times daily.   insulin glargine 100 UNIT/ML Solostar Pen Commonly known as: Lantus SoloStar Inject 38 Units into the skin daily.   Insulin Pen Needle 31G X 8 MM Misc Commonly known as: B-D ULTRAFINE III SHORT PEN 1 application by Does not apply route daily.   NovoLOG FlexPen 100 UNIT/ML FlexPen Generic drug: insulin aspart INJECT 15 UNITS INTO THE SKIN WITH THE TWO LARGEST MEALS OF THE DAY.   ondansetron 4 MG disintegrating tablet Commonly known as: ZOFRAN-ODT Take 1 tablet (4 mg total) by mouth every 8 (eight) hours as needed.   Oxycodone HCl 10 MG Tabs Take 10 mg by mouth 5 (five) times daily. What changed: Another  medication with the same name was added. Make sure you understand how and when to take each.   oxyCODONE 5 MG immediate release tablet Commonly known as: Roxicodone Take 1 tablet (5 mg total) by mouth every 6 (six) hours as needed for severe pain or breakthrough pain. What changed: You were already taking a medication with the same name, and this prescription was added. Make sure you  understand how and when to take each.   sitaGLIPtin 25 MG tablet Commonly known as: JANUVIA Take 1 tablet (25 mg total) by mouth daily. What changed:  medication strength how much to take   SUMAtriptan 100 MG tablet Commonly known as: IMITREX Take 1 tablet (100 mg total) by mouth once as needed for up to 1 dose for migraine. May repeat in 2 hours if headache persists or recurs.   tiZANidine 4 MG tablet Commonly known as: ZANAFLEX TAKE 1 TABLET BY MOUTH EVERY 8 HOURS AS NEEDED What changed:  how much to take how to take this when to take this reasons to take this additional instructions   Vitamin D (Ergocalciferol) 1.25 MG (50000 UNIT) Caps capsule Commonly known as: DRISDOL Take 1 capsule (50,000 Units total) by mouth every 7 (seven) days.        Follow-up [facility], [place]. Call.   Specialty: General Surgery Why: We are working on your appointment, call to confirm Please arrive 30 minutes prior to your appointment to check in and fill out paperwork. Bring photo ID and insurance information. Contact information: [address] [place] [phone]               Discharge Exam: BP (!) 161/71 (BP Location: Right Arm)   Pulse 69   Temp 98.1 F (36.7 C) (Oral)   Resp 15   Ht 4' 7"  (1.397 m)   Wt 104.3 kg   SpO2 97%   BMI 53.46 kg/m   General exam: Appears calm and comfortable Respiratory system: Clear to auscultation. Respiratory effort normal. Cardiovascular system: S1 & S2 heard, RRR. No murmurs, rubs, gallops or clicks. Gastrointestinal system: Abdomen is nondistended, soft and and tender in right quadrants. Normal bowel sounds heard. Central nervous system: Alert and oriented. No focal neurological deficits. Musculoskeletal: No calf tenderness Skin: No cyanosis. No rashes Psychiatry: Judgement and insight appear normal. Mood & affect appropriate.   Condition at discharge:  improving  The results of significant diagnostics from this hospitalization (including imaging, microbiology, ancillary and laboratory) are listed below for reference.   Imaging Studies: DG Chest 2 View  Result Date: 12/17/2021 CLINICAL DATA:  Chest pain short of breath EXAM: CHEST - 2 VIEW COMPARISON:  05/10/2008 FINDINGS: Hardware in the cervicothoracic spine. Retained metallic rods at the thoracolumbar spine as before. No acute airspace disease, pleural effusion, or pneumothorax. Stable cardiomediastinal silhouette. IMPRESSION: No active cardiopulmonary disease. Electronically Signed   By: Donavan Foil M.D.   On: 12/17/2021 19:16   US RENAL  Result Date: 12/20/2021 CLINICAL DATA:  Acute kidney injury, cholecystectomy today EXAM: RENAL / URINARY TRACT ULTRASOUND COMPLETE COMPARISON:  CT abdomen/pelvis dated 12/17/2021 FINDINGS: Right Kidney: Renal measurements: 11.0 x 5.8 x 4.6 cm = volume: 155 mL. Echogenicity within normal limits. No mass or hydronephrosis visualized. Left Kidney: Renal measurements: 11.1 x 6.2 x 4.9 cm = volume: 177 mL. Echogenicity within normal limits. No mass or hydronephrosis visualized. Bladder: Appears normal for degree of bladder distention. Other: None. IMPRESSION: Negative  renal ultrasound.  No hydronephrosis. Electronically Signed   By: Julian Hy M.D.   On: 12/20/2021 21:21   NM Hepato W/EF  Result Date: 12/18/2021 CLINICAL DATA:  Abdominal pain, upper, chronic, assess gallbladder motility EXAM: NUCLEAR MEDICINE HEPATOBILIARY IMAGING WITH GALLBLADDER EF TECHNIQUE: Sequential images of the abdomen were obtained out to 60 minutes following intravenous administration of radiopharmaceutical. After oral ingestion of Ensure, gallbladder ejection fraction was determined. At 60 min, normal ejection fraction is greater than 33%. RADIOPHARMACEUTICALS:  5.2 mCi Tc-91m Choletec IV COMPARISON:  None Available. FINDINGS: Prompt uptake and biliary excretion of activity by  the liver is seen. Gallbladder activity is visualized, consistent with patency of cystic duct. Biliary activity passes into small bowel, consistent with patent common bile duct. Calculated gallbladder ejection fraction is 17%. (Normal gallbladder ejection fraction with Ensure is greater than 33%.) IMPRESSION: Decreased gallbladder ejection fraction consistent with biliary dyskinesia. Electronically Signed   By: SValentino SaxonM.D.   On: 12/18/2021 15:02   CT Renal Stone Study  Result Date: 12/17/2021 CLINICAL DATA:  Right abdominal pain EXAM: CT ABDOMEN AND PELVIS WITHOUT CONTRAST TECHNIQUE: Multidetector CT imaging of the abdomen and pelvis was performed following the standard protocol without IV contrast. RADIATION DOSE REDUCTION: This exam was performed according to the departmental dose-optimization program which includes automated exposure control, adjustment of the mA and/or kV according to patient size and/or use of iterative reconstruction technique. COMPARISON:  None Available. FINDINGS: Lower chest: No acute abnormality. Hepatobiliary: Cholelithiasis without pericholecystic inflammatory change identified. No intra or extrahepatic biliary ductal dilation. No definite intrahepatic mass on this noncontrast examination. Pancreas: Unremarkable Spleen: Unremarkable Adrenals/Urinary Tract: The right adrenal gland is unremarkable. Stable 2.6 cm macroscopic fat containing mass within the left adrenal gland in keeping with a benign adrenal myelolipoma. The kidneys are normal in size and position. Vascular calcifications are seen within the renal hila bilaterally. Stable 2 mm nonobstructing calculi noted within the lower pole of the right kidney. No ureteral calculi. No hydronephrosis. No perinephric inflammatory stranding or fluid collections are seen. The bladder is unremarkable. Stomach/Bowel: Mild scattered descending and sigmoid colonic diverticulosis. The stomach, small bowel, and large bowel are  otherwise unremarkable. Appendix normal. No free intraperitoneal gas or fluid. Vascular/Lymphatic: Aortic atherosclerosis. No enlarged abdominal or pelvic lymph nodes. Reproductive: Fullness of the ovaries bilaterally is unchanged from prior examination better assessed on prior MRI examination of 11/06/2017. This is unchanged. The pelvic organs are otherwise unremarkable. Other: No abdominal wall hernia. 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 No acute bone abnormality. IMPRESSION: No acute intra-abdominal pathology identified. No definite radiographic explanation for the patient's reported symptoms. Cholelithiasis. 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 Minimal right nonobstructing nephrolithiasis, stable. Mild distal colonic diverticulosis without superimposed acute inflammatory change. Extensive lumbar fusion, unchanged. Aortic Atherosclerosis (ICD10-I70.0). Electronically Signed   By: AFidela SalisburyM.D.   On: 12/17/2021 22:32   UKoreaAbdomen Limited RUQ (LIVER/GB)  Result Date: 12/18/2021 CLINICAL DATA:  [id number]  Right upper quadrant abdominal pain. EXAM: ULTRASOUND ABDOMEN LIMITED RIGHT UPPER QUADRANT COMPARISON:  None Available. FINDINGS: Gallbladder: Small stones and sludge is seen within the gallbladder. The gallbladder, however, is not distended, there is no gallbladder wall thickening, and no pericholecystic fluid is identified. The sonographic MPercell Millersign is reportedly negative. Common bile duct: Diameter: 6 mm in proximal diameter Liver: Evaluation of the right hepatic lobe is  limited by the patient's body habitus and overlying aerated lung. Much of the right hepatic lobe is obscured. No focal intrahepatic masses are seen within the visualized liver. No intrahepatic biliary ductal dilation. Hepatic parenchymal echogenicity and echotexture is normal.  Portal vein is patent on color Doppler imaging with normal direction of blood flow towards the liver. Other: No ascites IMPRESSION: Cholelithiasis without sonographic evidence of acute cholecystitis. Limited, but unremarkable examination of the liver. Electronically Signed   By: Fidela Salisbury M.D.   On: 12/18/2021 00:03    Microbiology: Results for orders placed or performed in visit on 03/16/20  Novel Coronavirus, NAA ([facility])     Status: Abnormal   Collection Time: 03/16/20 10:27 AM   Specimen: Nasopharyngeal(NP) swabs in vial transport medium   Nasopharynge  Screenin  Result Value Ref Range Status   SARS-CoV-2, NAA Detected (A) Not Detected Final    Comment: Patients who have a positive COVID-19 test result may now have treatment options. Treatment options are available for patients with mild to moderate symptoms and for hospitalized patients. Visit our website at [email or web address] for resources and information. This nucleic acid amplification test was developed and its performance characteristics determined by [employer]. Nucleic acid amplification tests include RT-PCR and TMA. This test has not been FDA cleared or approved. This test has been authorized by FDA under an Emergency Use Authorization (EUA). This test is only authorized for the duration of time the declaration that circumstances exist justifying the authorization of the emergency use of in vitro diagnostic tests for detection of SARS-CoV-2 virus and/or diagnosis of COVID-19 infection under section 564(b)(1) of the Act, 21 U.S.C. 384TXM-4(W) (1), unless the authorization is terminated or revoked sooner. When diagnostic testing is negativ e, the possibility of a false negative result should be considered in the context of a patient's recent exposures and the presence of clinical signs and symptoms consistent with COVID-19. An individual without symptoms of COVID-19 and who is not shedding  SARS-CoV-2 virus would expect to have a negative (not detected) result in this assay.   SARS-COV-2, NAA 2 DAY TAT     Status: None   Collection Time: 03/16/20 10:27 AM   Nasopharynge  Screenin  Result Value Ref Range Status   SARS-CoV-2, NAA 2 DAY TAT Performed  Final    Labs: CBC: Recent Labs  Lab 12/17/21 1844 12/18/21 0315 12/19/21 0656 12/20/21 0322  WBC 9.7 11.7* 7.9 10.1  NEUTROABS 6.3  --   --   --   HGB 11.3* 12.7 9.5* 10.0*  HCT 34.3* 37.5 28.5* 30.1*  MCV 93.5 92.4 93.4 95.6  PLT 214 256 152 803   Basic Metabolic Panel: Recent Labs  Lab 12/18/21 0315 12/19/21 0656 12/19/21 1451 12/20/21 0322 12/21/21 0558  NA 140 142 139 136 139  K 3.9 4.3 4.5 4.5 4.3  CL 113* 114* 114* 113* 115*  CO2 20* 22 17* 18* 17*  GLUCOSE 159* 181* 100* 191* 248*  BUN 26* 38* 37* 45* 46*  CREATININE 2.47* 2.96* 2.88* 3.38* 2.82*  CALCIUM 8.9 8.3* 8.1* 7.9* 8.1*  MG  --  1.8  --   --   --    Liver Function Tests: Recent Labs  Lab 12/18/21 0315 12/19/21 0920  AST 23 20  ALT 27 21  ALKPHOS 78 67  BILITOT 0.4 0.5  PROT 7.6 5.7*  ALBUMIN 3.3* 2.6*   CBG: Recent Labs  Lab 12/20/21 1145 12/20/21 1441 12/20/21 1706 12/20/21 2022 12/21/21 0741  GLUCAP 94  151* 190* 291* 240*    Discharge time spent: 35 minutes.  Signed: Cordelia Poche, MD [facility] 12/21/2021

## 2021-12-21 NOTE — Progress Notes (Signed)
Patient ID: Briana Williams, female   DOB: 1981-06-23, 41 y.o.   MRN: [id number] [facility] Progress Note   Assessment/ Plan:   1. Acute kidney Injury on chronic kidney disease stage IIIb: Underlying renal injury appears to be diabetic nephropathy with baseline creatinine of around 2.2.  Presented to the hospital with acute kidney injury in the setting of right upper quadrant abdominal pain/biliary dyskinesia and is now status postcholecystectomy.  Renal function improved overnight with creatinine down to 2.8 from 3.4 and urine output appears to be incompletely charted.  No acute electrolyte abnormality or indication for dialysis noted.  Anticipate ability to discharge her home within the next 24 hours if continues to show renal recovery and stable from surgical standpoint. 2.  Right upper quadrant/epigastric abdominal pain: Secondary to biliary dyskinesia and gallstones, she is status postcholecystectomy. 3.  Anemia: Chronic and likely exacerbated by recent hospitalization/acute inflammatory complex.  No overt blood loss. 4.  Hypertension: Blood pressure elevated with ARB currently on hold in the setting of acute kidney injury.  Will resume ARB following renal recovery.  Subjective:   Reports to be feeling fair and inquires about going home.   Objective:   BP (!) 161/71 (BP Location: Right Arm)   Pulse 69   Temp 98.1 F (36.7 C) (Oral)   Resp 15   Ht 4\' 7"  (1.397 m)   Wt 104.3 kg   SpO2 97%   BMI 53.46 kg/m   Intake/Output Summary (Last 24 hours) at 12/21/2021 5284 Last data filed at 12/21/2021 0400 Gross per 24 hour  Intake 600 ml  Output 620 ml  Net -20 ml   Weight change:   Physical Exam: Gen: Comfortably resting in bed, husband at bedside CVS: Pulse regular rhythm, normal rate, S1 and S2 normal Resp: Diminished breath sounds over bases, poor inspiratory effort.  No rales/rhonchi Abd: Soft, obese, nontender Ext: 2-3+ lower extremity edema  Imaging: US  RENAL  Result Date: 12/20/2021 CLINICAL DATA:  Acute kidney injury, cholecystectomy today EXAM: RENAL / URINARY TRACT ULTRASOUND COMPLETE COMPARISON:  CT abdomen/pelvis dated 12/17/2021 FINDINGS: Right Kidney: Renal measurements: 11.0 x 5.8 x 4.6 cm = volume: 155 mL. Echogenicity within normal limits. No mass or hydronephrosis visualized. Left Kidney: Renal measurements: 11.1 x 6.2 x 4.9 cm = volume: 177 mL. Echogenicity within normal limits. No mass or hydronephrosis visualized. Bladder: Appears normal for degree of bladder distention. Other: None. IMPRESSION: Negative renal ultrasound.  No hydronephrosis. Electronically Signed   By: Julian Hy M.D.   On: 12/20/2021 21:21    Labs: BMET Recent Labs  Lab 12/17/21 1844 12/18/21 0315 12/19/21 0656 12/19/21 1451 12/20/21 0322 12/21/21 0558  NA 135 140 142 139 136 139  K 3.9 3.9 4.3 4.5 4.5 4.3  CL 108 113* 114* 114* 113* 115*  CO2 19* 20* 22 17* 18* 17*  GLUCOSE 249* 159* 181* 100* 191* 248*  BUN 31* 26* 38* 37* 45* 46*  CREATININE 2.65* 2.47* 2.96* 2.88* 3.38* 2.82*  CALCIUM 8.5* 8.9 8.3* 8.1* 7.9* 8.1*   CBC Recent Labs  Lab 12/17/21 1844 12/18/21 0315 12/19/21 0656 12/20/21 0322  WBC 9.7 11.7* 7.9 10.1  NEUTROABS 6.3  --   --   --   HGB 11.3* 12.7 9.5* 10.0*  HCT 34.3* 37.5 28.5* 30.1*  MCV 93.5 92.4 93.4 95.6  PLT 214 256 152 186    Medications:     amitriptyline  25 mg Oral QHS   atorvastatin  10 mg Oral  Daily   busPIRone  10 mg Oral TID   enoxaparin (LOVENOX) injection  30 mg Subcutaneous Daily   escitalopram  20 mg Oral Daily   gabapentin  400 mg Oral TID   hydrochlorothiazide  25 mg Oral Daily   insulin aspart  0-15 Units Subcutaneous TID WC   insulin glargine-yfgn  20 Units Subcutaneous Daily   sodium chloride flush  3 mL Intravenous Q12H   Elmarie Shiley, MD 12/21/2021, 8:32 AM

## 2021-12-21 NOTE — Progress Notes (Signed)
Progress Note  1 Day Post-Op  Subjective: Pt reports incisional abdominal pain. She has some nausea but no vomiting. Has mobilized some, not passing flatus yet. She wants to go home today. She reports she is having issues with getting chronic pain meds filled due to medication shortages.   Objective: Vital signs in last 24 hours: Temp:  [97.1 F (36.2 C)-98.1 F (36.7 C)] 98.1 F (36.7 C) (05/26 0740) Pulse Rate:  [69-79] 69 (05/26 0740) Resp:  [15-23] 15 (05/26 0740) BP: (159-180)/(68-87) 161/71 (05/26 0740) SpO2:  [94 %-100 %] 97 % (05/26 0415) Weight:  [104.3 kg] 104.3 kg (05/25 1146)    Intake/Output from previous day: 05/25 0701 - 05/26 0700 In: 600 [I.V.:500; IV Piggyback:100] Out: 620 [Urine:600; Blood:20] Intake/Output this shift: No intake/output data recorded.  PE: Gen:  Alert, NAD Card:  RRR Pulm:  rate and effort normal on room air Abd: obese, soft, ND, +BS, appropriately ttp, incisions C/D/I   Lab Results:  Recent Labs    12/19/21 0656 12/20/21 0322  WBC 7.9 10.1  HGB 9.5* 10.0*  HCT 28.5* 30.1*  PLT 152 186   BMET Recent Labs    12/20/21 0322 12/21/21 0558  NA 136 139  K 4.5 4.3  CL 113* 115*  CO2 18* 17*  GLUCOSE 191* 248*  BUN 45* 46*  CREATININE 3.38* 2.82*  CALCIUM 7.9* 8.1*   PT/INR No results for input(s): LABPROT, INR in the last 72 hours. CMP     Component Value Date/Time   NA 139 12/21/2021 0558   NA 141 06/27/2021 1224   K 4.3 12/21/2021 0558   CL 115 (H) 12/21/2021 0558   CO2 17 (L) 12/21/2021 0558   GLUCOSE 248 (H) 12/21/2021 0558   BUN 46 (H) 12/21/2021 0558   BUN 32 (H) 06/27/2021 1224   CREATININE 2.82 (H) 12/21/2021 0558   CREATININE 2.55 (H) 07/19/2021 1337   CREATININE 0.59 09/21/2015 1740   CALCIUM 8.1 (L) 12/21/2021 0558   PROT 5.7 (L) 12/19/2021 0920   PROT 7.0 08/11/2020 1632   ALBUMIN 2.6 (L) 12/19/2021 0920   ALBUMIN 3.9 08/11/2020 1632   AST 20 12/19/2021 0920   AST 16 07/19/2021 1337   ALT 21  12/19/2021 0920   ALT 16 07/19/2021 1337   ALKPHOS 67 12/19/2021 0920   BILITOT 0.5 12/19/2021 0920   BILITOT 0.4 07/19/2021 1337   GFRNONAA 21 (L) 12/21/2021 0558   GFRNONAA 24 (L) 07/19/2021 1337   GFRNONAA >89 09/21/2015 1740   GFRAA 54 (L) 08/11/2020 1632   GFRAA >89 09/21/2015 1740   Lipase     Component Value Date/Time   LIPASE 90 (H) 12/18/2021 0315       Studies/Results: US RENAL  Result Date: 12/20/2021 CLINICAL DATA:  Acute kidney injury, cholecystectomy today EXAM: RENAL / URINARY TRACT ULTRASOUND COMPLETE COMPARISON:  CT abdomen/pelvis dated 12/17/2021 FINDINGS: Right Kidney: Renal measurements: 11.0 x 5.8 x 4.6 cm = volume: 155 mL. Echogenicity within normal limits. No mass or hydronephrosis visualized. Left Kidney: Renal measurements: 11.1 x 6.2 x 4.9 cm = volume: 177 mL. Echogenicity within normal limits. No mass or hydronephrosis visualized. Bladder: Appears normal for degree of bladder distention. Other: None. IMPRESSION: Negative renal ultrasound.  No hydronephrosis. Electronically Signed   By: Julian Hy M.D.   On: 12/20/2021 21:21    Anti-infectives: Anti-infectives (From admission, onward)    Start     Dose/Rate Route Frequency Ordered Stop   12/20/21 1145  cefTRIAXone (ROCEPHIN) 2  g in sodium chloride 0.9 % 100 mL IVPB        2 g 200 mL/hr over 30 Minutes Intravenous  Once 12/20/21 1046 12/20/21 1340        Assessment/Plan POD1 S/p laparoscopic cholecystectomy  - expected post-op pain, will try to contact pain specialist today to discuss whether we can prescribe any meds for breakthrough acute post-op pain - mobilize as able - ok to discharge from surgical standpoint if keeping down liquids and feels like she can manage pain at home - will see back in office in about 3 weeks, follow up and instructions in AVS already    ID -  rocephin  pre-op, no further abx needed post-op VTE - lovenox FEN - REG Foley - none   Hypertensive urgency   HTN HLD DM Obesity Chronic kidney disease Chronic pain    LOS: 3 days     [name], [facility] Surgery 12/21/2021, 9:15 AM Please see Amion for pager number during day hours 7:00am-4:30pm

## 2021-12-21 NOTE — Consult Note (Signed)
[facility] Inpatient Consult   12/21/2021  Briana Williams Dec 04, 1980 [id number]  Managed Medicaid: [place]    Primary Care Provider:  Ladell Pier, MD, [facility]   Patient screened for hospitalization and assess for any high risk needs. Review of patient's medical record reveals patient is post op and ready for transitioning home. Met with patient at the bedside, had eaten a little lunch. Explained nature of visit and gien a reminder card for PCP and a 24 hour nurse advise line. .   Plan:   Diabetes management follow up as last A1C greater than 9  For questions contact:   Natividad Brood, RN BSN [facility] Liaison  [phone] business mobile phone Toll free office [phone]  Fax number: [phone] [email or web address]

## 2021-12-21 NOTE — Anesthesia Postprocedure Evaluation (Signed)
Anesthesia Post Note  Patient: Briana Williams  Procedure(s) Performed: LAPAROSCOPIC CHOLECYSTECTOMY     Patient location during evaluation: PACU Anesthesia Type: General Level of consciousness: awake and alert Pain management: pain level controlled Vital Signs Assessment: post-procedure vital signs reviewed and stable Respiratory status: spontaneous breathing, nonlabored ventilation, respiratory function stable and patient connected to nasal cannula oxygen Cardiovascular status: blood pressure returned to baseline and stable Postop Assessment: no apparent nausea or vomiting Anesthetic complications: no   No notable events documented.  Last Vitals:  Vitals:   12/21/21 0415 12/21/21 0740  BP: (!) 162/68 (!) 161/71  Pulse:  69  Resp: (!) 23 15  Temp:  36.7 C  SpO2: 97%     Last Pain:  Vitals:   12/21/21 0740  TempSrc: Oral  PainSc: 0-No pain                 Margert Edsall S

## 2021-12-25 ENCOUNTER — Telehealth: Payer: Self-pay

## 2021-12-25 ENCOUNTER — Other Ambulatory Visit: Payer: Self-pay

## 2021-12-25 MED ORDER — NALOXONE HCL 4 MG/0.1ML NA LIQD
NASAL | 1 refills | Status: AC
  Filled 2021-12-25: qty 2, 30d supply, fill #0

## 2021-12-25 NOTE — Telephone Encounter (Signed)
Transition Care Management Unsuccessful Follow-up Telephone Call  Date of discharge and from where:  12/21/2021, [facility]   Attempts:  1st Attempt  Reason for unsuccessful TCM follow-up call:  Left voice message on # [phone], call back requested.  This is also listed as the phone number for patient's spouse, Grayland Ormond.

## 2021-12-25 NOTE — Telephone Encounter (Signed)
Transition Care Management Unsuccessful Follow-up Telephone Call  Date of discharge and from where:  12/21/2021 from [facility]  Attempts:  1st Attempt  Reason for unsuccessful TCM follow-up call:  Missing or invalid number

## 2021-12-26 ENCOUNTER — Telehealth: Payer: Self-pay

## 2021-12-26 ENCOUNTER — Other Ambulatory Visit: Payer: Self-pay

## 2021-12-26 NOTE — Telephone Encounter (Signed)
Transition Care Management Follow-up Telephone Call Date of discharge and from where: 12/21/2021, [facility] How have you been since you were released from the hospital? She said she is sore; but doing good.  Any questions or concerns? No  Items Reviewed: Did the pt receive and understand the discharge instructions provided? Yes  Medications obtained and verified? Yes  Other? No  Any new allergies since your discharge? No  Dietary orders reviewed? No Do you have support at home? Yes   Home Care and Equipment/Supplies: Were home health services ordered? no If so, what is the name of the agency? N/a  Has the agency set up a time to come to the patient's home? not applicable Were any new equipment or medical supplies ordered?  No What is the name of the medical supply agency? N/a Were you able to get the supplies/equipment? not applicable Do you have any questions related to the use of the equipment or supplies? No  Functional Questionnaire: (I = Independent and D = Dependent) ADLs: independent  Follow up appointments reviewed:  PCP Hospital f/u appt confirmed? Yes  Scheduled to see Dr Wynetta Emery  - 02/19/2022- she did not want to reschedule to be seen sooner.  [facility] f/u appt confirmed?  She said that the surgeon's office is supposed to call her by the end of the week to schedule the follow up appt.    Are transportation arrangements needed? No  If their condition worsens, is the pt aware to call PCP or go to the Emergency Dept.? Yes Was the patient provided with contact information for the PCP's office or ED? Yes Was to pt encouraged to call back with questions or concerns? Yes

## 2021-12-26 NOTE — Telephone Encounter (Signed)
[facility] is doing TOC call... closing this encounter.

## 2021-12-27 ENCOUNTER — Other Ambulatory Visit: Payer: Self-pay

## 2021-12-27 DIAGNOSIS — M549 Dorsalgia, unspecified: Secondary | ICD-10-CM | POA: Diagnosis not present

## 2021-12-27 DIAGNOSIS — M1990 Unspecified osteoarthritis, unspecified site: Secondary | ICD-10-CM | POA: Diagnosis not present

## 2021-12-27 DIAGNOSIS — E119 Type 2 diabetes mellitus without complications: Secondary | ICD-10-CM | POA: Diagnosis not present

## 2021-12-27 DIAGNOSIS — Z79899 Other long term (current) drug therapy: Secondary | ICD-10-CM | POA: Diagnosis not present

## 2021-12-27 DIAGNOSIS — R030 Elevated blood-pressure reading, without diagnosis of hypertension: Secondary | ICD-10-CM | POA: Diagnosis not present

## 2021-12-27 DIAGNOSIS — G8929 Other chronic pain: Secondary | ICD-10-CM | POA: Diagnosis not present

## 2021-12-27 DIAGNOSIS — M542 Cervicalgia: Secondary | ICD-10-CM | POA: Diagnosis not present

## 2021-12-27 DIAGNOSIS — Z6841 Body Mass Index (BMI) 40.0 and over, adult: Secondary | ICD-10-CM | POA: Diagnosis not present

## 2021-12-27 NOTE — Patient Instructions (Signed)
Visit Information  Ms. Arcand was given information about Medicaid Managed Care team care coordination services as a part of their Healthy [facility] Medicaid benefit. [name] verbally consented to engagement with the [facility] Managed Care team.   If you are experiencing a medical emergency, please call 911 or report to your local emergency department or urgent care.   If you have a non-emergency medical problem during routine business hours, please contact your provider's office and ask to speak with a nurse.   For questions related to your Healthy [facility] health plan, please call: [phone] or visit the homepage here: [email or web address]  If you would like to schedule transportation through your Healthy [facility] plan, please call the following number at least 2 days in advance of your appointment: [phone]  For information about your ride after you set it up, call Ride Assist at [phone]. Use this number to activate a Will Call pickup, or if your transportation is late for a scheduled pickup. Use this number, too, if you need to make a change or cancel a previously scheduled reservation.  If you need transportation services right away, call [phone]. The after-hours call center is staffed 24 hours to handle ride assistance and urgent reservation requests (including discharges) 365 days a year. Urgent trips include sick visits, hospital discharge requests and life-sustaining treatment.  Call the [employer] at [phone], at any time, 24 hours a day, 7 days a week. If you are in danger or need immediate medical attention call 911.  If you would like help to quit smoking, call 1-800-QUIT-NOW 989 816 6799) OR Espaol: 1-855-Djelo-Ya (4-562-563-8937) o para ms informacin haga clic aqu or Text READY to 200-400 to register via text  Ms. Placide - following are the goals we discussed in your visit  today:  Please see Patient Goals in the [place] of Care below.  Please see education materials related to today's visit provided by MyChart link.  Patient verbalizes understanding of instructions and care plan provided today and agrees to view in [place]. Active MyChart status and patient understanding of how to access instructions and care plan via MyChart confirmed with patient.     The Managed Medicaid care management team will reach out to the patient again over the next 40 days.   Salvatore Marvel RN, BSN Community Care Coordinator [facility] Mobile: [phone]   Following is a copy of your plan of care:  Care Plan : [place] of Care  Updates made by Inge Rise, RN since 12/27/2021 12:00 AM     Problem: Chronic Disease Management and Care Coordination Needs for DM, HTN, CKD Stage 3, Chronic Pain   Priority: High  Onset Date: 12/27/2021     Long-Range Goal: Development of Plan of Care for Chronic Disease Management and Care Coordination Needs (DM, HTN, CKD Stage 3, Chronic Pain)   Start Date: 12/27/2021  Expected End Date: 04/26/2022  Priority: High  Note:   Current Barriers:  Knowledge Deficits related to plan of care for management of HTN, DMII, CKD Stage 3, and Chronic Pain  Care Coordination needs related to complex medication regimen.  Chronic Disease Management support and education needs related to HTN, DMII, CKD Stage 3, and Chronic Pain No Advanced Directives in place  RNCM Clinical Goal(s):  Patient will verbalize understanding of plan for management of HTN, DMII, CKD Stage 3, and Chronic Pain as evidenced by improved management of these chronic diseases verbalize  basic understanding of HTN, DMII, CKD Stage 3, and Chronic Pain disease process and self health management plan as evidenced by improved management of these chronic diseases. take all medications exactly as prescribed and will call provider for  medication related questions as evidenced by being compliant with all medications    attend all scheduled medical appointments: need to schedule post hospital visits with cholecystectomy surgeon and PCP; scheduled PCP office visit on 02/19/22 as evidenced by attending all scheduled appointments        demonstrate improved adherence to prescribed treatment plan for HTN, DMII, CKD Stage 3, and Chronic Pain as evidenced by improved A1C results, blood pressure readings are < 130/80 and patient's pain is managed effectively. continue to work with Consulting civil engineer and/or Social Worker to address care management and care coordination needs related to HTN, DMII, CKD Stage 3, and Chronic Pain as evidenced by adherence to CM Team Scheduled appointments     work with pharmacist to address complex medication regimen related to HTN, DMII, CKD Stage 3, and Chronic Pain as evidenced by review of EMR and patient or pharmacist report    through collaboration with RN Care manager, provider, and care team.   Interventions: Inter-disciplinary care team collaboration (see longitudinal plan of care) Evaluation of current treatment plan related to  self management and patient's adherence to plan as established by provider   Chronic Kidney Disease (Status: New goal.)  Long Term Goal  Last practice recorded BP readings:  BP Readings from Last 3 Encounters:  12/21/21 (!) 161/71  11/13/21 (!) 195/92  11/06/21 (!) 189/92   Lab Results  Component Value Date   NA 139 12/21/2021   K 4.3 12/21/2021   CO2 17 (L) 12/21/2021   GLUCOSE 248 (H) 12/21/2021   BUN 46 (H) 12/21/2021   CREATININE 2.82 (H) 12/21/2021   CALCIUM 8.1 (L) 12/21/2021   EGFR 36 (L) 06/27/2021   GFRNONAA 21 (L) 12/21/2021    Most recent eGFR/CrCl:  Lab Results  Component Value Date   EGFR 36 (L) 06/27/2021    No components found for: CRCL  Assessed the patient     understanding of chronic kidney disease    Evaluation of current treatment plan  related to chronic kidney disease self management and patient's adherence to plan as established by provider      Reviewed medications with patient and discussed importance of compliance    Advised patient, providing education and rationale, to monitor blood pressure daily and record, calling PCP for findings outside established parameters    Reviewed scheduled/upcoming provider appointments including    Discussed plans with patient for ongoing care management follow up and provided patient with direct contact information for care management team    Screening for signs and symptoms of depression related to chronic disease state      Assessed social determinant of health barriers    Discussed how patient was diagnosed with Acute Kidney Injury (AKI) during her recent hospitalization 12/17/21 - 12/21/21 and how her body has gone through a great deal of stress recently.   Diabetes:  (Status: New goal.) Long Term Goal   Lab Results  Component Value Date   HGBA1C 8.9 (A) 06/27/2021  Assessed patient's understanding of A1c goal: <7% Reviewed medications with patient and discussed importance of medication adherence;        Discussed plans with patient for ongoing care management follow up and provided patient with direct contact information for care management team;  Reviewed scheduled/upcoming provider appointments including: See RNCM Clinical Goals above;         Advised patient, providing education and rationale, to check cbg three times daily and record        call provider for findings outside established parameters;       Referral made to pharmacy team for assistance with complex medication regimen;       Screening for signs and symptoms of depression related to chronic disease state;        Assessed social determinant of health barriers;        Patient's blood sugar range during recent hospitalization was in Mid to Upper 200.  Patient reports her recent blood sugar range at home has been in  the 100 range. Noted decrease in Januvia to 25 mg daily due to renal dysfunction.  Hypertension: (Status: New goal.) Long Term Goal  Last practice recorded BP readings:  BP Readings from Last 3 Encounters:  12/21/21 (!) 161/71  11/13/21 (!) 195/92  11/06/21 (!) 189/92  Most recent eGFR/CrCl:  Lab Results  Component Value Date   EGFR 36 (L) 06/27/2021    No components found for: CRCL  Evaluation of current treatment plan related to hypertension self management and patient's adherence to plan as established by provider;   Reviewed prescribed diet Heart Healthy, low sodium, low carbohydrates Reviewed medications with patient and discussed importance of compliance;  Counseled on the importance of exercise goals with target of 150 minutes per week Discussed plans with patient for ongoing care management follow up and provided patient with direct contact information for care management team; Advised patient, providing education and rationale, to monitor blood pressure daily and record, calling PCP for findings outside established parameters;  Reviewed scheduled/upcoming provider appointments including:  Screening for signs and symptoms of depression related to chronic disease state;  Assessed social determinant of health barriers;  Patient has blood pressure monitor at home and checks blood pressure usually 1 - 2 times/week.  Patient did not report any recent blood pressure readings since she has not checked her blood pressure since getting home after hospitalization.  This RN encouraged her to monitor her blood pressure regularly especially with medication changes post hospital discharge.  We reviewed hospital discharge orders: Hold Hydrochlorothiazine 25 mg tablet and Olmesartan (Benicar) 20 mg tablet; resume hydralazine and coreg.  Patient verbalized understanding.  Pain:  (Status: New goal.) Long Term Goal  Pain assessment performed. Patient reports chronic pain in bilateral hips and legs  with pain radiating down Left leg more.  Chronic pain due to long term scoliosis and multiple back surgeries throughout her life.  Patient rates pain as a "8" on pain scale of 0 - 10. Medications reviewed Reviewed provider established plan for pain management; Discussed importance of adherence to all scheduled medical appointments; Counseled on the importance of reporting any/all new or changed pain symptoms or management strategies to pain management provider; Reviewed with patient prescribed pharmacological and nonpharmacological pain relief strategies; Screening for signs and symptoms of depression related to chronic disease state;  Assessed social determinant of health barriers;  Medication change post hospital discharge: Gabapentin reduced to 400 mg 1 tablet BID due to renal dysfunction.  Patient Goals/Self-Care Activities: Take medications as prescribed   Attend all scheduled provider appointments Call pharmacy for medication refills 3-7 days in advance of running out of medications Call provider office for new concerns or questions  Work with [facility] Pharmacist regarding complex medication regimen check blood sugar at prescribed  times: three times daily take the blood sugar meter to all doctor visits check blood pressure 3 times per week take blood pressure log to all doctor appointments call doctor for signs and symptoms of high blood pressure limit salt intake to 2000 mg/day

## 2021-12-27 NOTE — Patient Outreach (Signed)
Medicaid Managed Care   Nurse Care Manager Note  12/27/2021 Name:  Briana Williams MRN:  [id number] DOB:  1981/02/06  Briana Williams is an 41 y.o. year old female who is a primary patient of Ladell Pier, Williams.  The [facility] team was consulted for assistance with:    HTN DMII CKD Stage3 Chronic Pain  [name] was given information about Medicaid Managed Care Coordination team services today. [name] Patient agreed to services and verbal consent obtained.  Engaged with patient by telephone for initial visit in response to provider referral for case management and/or care coordination services.   Assessments/Interventions:  Review of past medical history, allergies, medications, health status, including review of consultants reports, laboratory and other test data, was performed as part of comprehensive evaluation and provision of chronic care management services.  SDOH (Social Determinants of Health) assessments and interventions performed: SDOH Interventions    Flowsheet Row Most Recent Value  SDOH Interventions   Food Insecurity Interventions Intervention Not Indicated  Financial Strain Interventions Intervention Not Indicated  Housing Interventions Intervention Not Indicated  Physical Activity Interventions Intervention Not Indicated  Stress Interventions Patient Refused  [Offered referral to [facility] LCSW but patient declined]  Social Connections Interventions Intervention Not Indicated  Transportation Interventions Intervention Not Indicated  Depression Interventions/Treatment  Medication  [Patient refused offer of referral to [facility]  Allergies  Allergen Reactions   Hydrocodone Itching    Note: benadryl does NOT help   Lisinopril Nausea And Vomiting    Caused nausea   Metformin And Related Other (See Comments)    Causes blisters on skin   Norvasc [Amlodipine Besylate] Swelling    Ankle swelling    Other Other (See Comments)    Paper Tape causes Blisters   Ultrasound Gel Rash and Other (See Comments)    Patient gets a red rash with use of ultrasound gel/ burns skin    Medications Reviewed Today     Reviewed by Inge Rise, RN (Case Manager) on 12/27/21 at 1328  Med List Status: <None>   Medication Order Taking? Sig Documenting Provider Last Dose Status Informant  ACCU-CHEK SOFTCLIX LANCETS lancets [id number] Yes 1 each by Other route 3 (three) times daily. ICD 10 E11.9 Briana Williams Taking Active Self  Accu-Chek Softclix Lancets lancets [id number] Yes Use as instructed Ladell Pier, Williams Taking Active Self  acetaminophen (TYLENOL) 650 MG CR tablet [id number] Yes Take 650 mg by mouth every 8 (eight) hours as needed for pain. Provider, Historical, Williams Taking Active Self  albuterol (PROVENTIL HFA;VENTOLIN HFA) 108 (90 Base) MCG/ACT inhaler [id number] Yes Inhale 1 puff into the lungs every 6 (six) hours as needed for wheezing or shortness of breath. Briana Williams Taking Active Self  amitriptyline (ELAVIL) 25 MG tablet [id number] Yes TAKE 1 TABLET(25 MG) BY MOUTH AT BEDTIME Ladell Pier, Williams Taking Active Self  atorvastatin (LIPITOR) 10 MG tablet [id number] Yes Take 1 tablet (10 mg total) by mouth daily. Ladell Pier, Williams Taking Active Self  Blood Glucose Monitoring Suppl (ACCU-CHEK AVIVA PLUS) w/Device KIT [id number] Yes 1 Device by Does not apply route 3 (three) times daily after meals. ICD 10 E11.9 Briana Williams Taking Active Self  Blood Glucose Monitoring Suppl (ACCU-CHEK GUIDE) w/Device Drucie Opitz [id number] Yes Use as directed Ladell Pier, Williams Taking Active Self  busPIRone (BUSPAR) 10 MG tablet [id number] Yes TAKE 1 TABLET(10 MG) BY  MOUTH THREE TIMES DAILY Ladell Pier, Williams Taking Active Self  carvedilol (COREG) 12.5 MG tablet [id number] Yes Take 1 tablet (12.5 mg total) by mouth 2 (two) times daily with a meal. Ladell Pier, Williams Taking  Active Self  D3-50 1.25 MG (50000 UT) capsule [id number] Yes Take 50,000 Units by mouth once a week. Provider, Historical, Williams Taking Active Self  escitalopram (LEXAPRO) 20 MG tablet [id number] Yes TAKE 1 TABLET(20 MG) BY MOUTH DAILY Ladell Pier, Williams Taking Active Self  ferrous sulfate 325 (65 FE) MG tablet [id number] Yes Take 1 tablet (325 mg total) by mouth daily with breakfast. Ladell Pier, Williams Taking Active Self  gabapentin (NEURONTIN) 400 MG capsule [id number] Yes Take 1 capsule (400 mg total) by mouth 2 (two) times daily. Briana Williams Taking Active   glucose blood (ACCU-CHEK AVIVA PLUS) test strip [id number] Yes 1 each by Other route 3 (three) times daily. ICD 10 E11.9 Briana Williams Taking Active Self  glucose blood (ACCU-CHEK GUIDE) test strip [id number] Yes Test 3x/day before meals Ladell Pier, Williams Taking Active Self  hydrALAZINE (APRESOLINE) 10 MG tablet [id number] Yes Take 1 tablet (10 mg total) by mouth 2 (two) times daily. Ladell Pier, Williams Taking Active Self  Insulin Glargine (LANTUS SOLOSTAR) 100 UNIT/ML Solostar Pen [id number] Yes Inject 38 Units into the skin daily. Ladell Pier, Williams Taking Active Self  Insulin Pen Needle (B-D ULTRAFINE III SHORT PEN) 31G X 8 MM MISC [id number] Yes 1 application by Does not apply route daily. Briana Williams Taking Active Self  Lancet Devices [facility]) lancets [id number] Yes 1 each by Other route 3 (three) times daily. ICD 10 E11.9 Funches, Adriana Mccallum, Williams Taking Active Self  naloxone [facility]) nasal spray 4 mg/0.1 mL [id number] Yes Inhale 1 spray as needed.  Taking Active   NOVOLOG FLEXPEN 100 UNIT/ML FlexPen [id number] Yes INJECT 15 UNITS INTO THE SKIN WITH THE TWO LARGEST MEALS OF THE DAY. Ladell Pier, Williams Taking Active Self  ondansetron (ZOFRAN-ODT) 4 MG disintegrating tablet [id number] Yes Take 1 tablet (4 mg total) by mouth every 8 (eight) hours as needed. Briana Williams Taking Active   oxyCODONE  (ROXICODONE) 5 MG immediate release tablet [id number] Yes Take 1 tablet (5 mg total) by mouth every 6 (six) hours as needed for severe pain or breakthrough pain. Briana Parcel, PA-C Taking Active   Oxycodone HCl 10 MG TABS [id number] Yes Take 10 mg by mouth 5 (five) times daily. Provider, Historical, Williams Taking Active Self           Med Note [facility], Jazsmine Macari A   Thu Dec 27, 2021 10:50 AM) Takes PRN  Oxycodone HCl 10 MG TABS [id number] Yes Take 5 tablets by mouth daily as needed.  Taking Active            Med Note [facility], Carley Glendenning A   Thu Dec 27, 2021 10:50 AM) Takes PRN  sitaGLIPtin (JANUVIA) 25 MG tablet [id number] Yes Take 1 tablet (25 mg total) by mouth daily. Briana Williams Taking Active   SUMAtriptan (IMITREX) 100 MG tablet [id number] Yes Take 1 tablet (100 mg total) by mouth once as needed for up to 1 dose for migraine. May repeat in 2 hours if headache persists or recurs. Marcial Pacas, Williams Taking Active Self  tiZANidine (ZANAFLEX) 4 MG tablet [id number] Yes TAKE 1 TABLET BY MOUTH EVERY 8 HOURS AS NEEDED  Patient taking differently: Take 4 mg by mouth  every 8 (eight) hours as needed for muscle spasms.   Ladell Pier, Williams Taking Active Self  Vitamin D, Ergocalciferol, (DRISDOL) 1.25 MG (50000 UNIT) CAPS capsule [id number] Yes Take 1 capsule (50,000 Units total) by mouth every 7 (seven) days. Ladell Pier, Williams Taking Active Self            Patient Active Problem List   Diagnosis Date Noted   Acute cholecystitis 12/21/2021   Anemia in chronic kidney disease 12/10/2020   Nephrotic range proteinuria 12/10/2020   Influenza vaccine refused 08/11/2020   Cervical myelopathy ([place]) 08/11/2020   Closed fracture of odontoid process of axis ([place]) 06/11/2019   Migraines 11/19/2018   Acute renal failure superimposed on chronic kidney disease ([place]) 10/03/2018   Morbid obesity ([place]) 07/12/2018   Muscle spasm of back 04/24/2017   Osteoarthritis of both hips 01/15/2017   Recurrent  headache 01/06/2017   Irregular menstrual cycle 05/30/2016   Chronic pain syndrome 09/21/2015   HYPERTRIGLYCERIDEMIA 05/02/2009   Insomnia 05/01/2009   Idiopathic scoliosis and kyphoscoliosis 05/18/2008   Depression 04/18/2008   Essential hypertension, benign 04/18/2008    Conditions to be addressed/monitored per PCP order:  HTN, DMII, CKD Stage 3, and Chronic Pain  Care Plan : RN Care Manager Plan of Care  Updates made by Inge Rise, RN since 12/27/2021 12:00 AM     Problem: Chronic Disease Management and Care Coordination Needs for DM, HTN, CKD Stage 3, Chronic Pain   Priority: High  Onset Date: 12/27/2021     Long-Range Goal: Development of Plan of Care for Chronic Disease Management and Care Coordination Needs (DM, HTN, CKD Stage 3, Chronic Pain)   Start Date: 12/27/2021  Expected End Date: 04/26/2022  Priority: High  Note:   Current Barriers:  Knowledge Deficits related to plan of care for management of HTN, DMII, CKD Stage 3, and Chronic Pain  Care Coordination needs related to complex medication regimen.  Chronic Disease Management support and education needs related to HTN, DMII, CKD Stage 3, and Chronic Pain No Advanced Directives in place  RNCM Clinical Goal(s):  Patient will verbalize understanding of plan for management of HTN, DMII, CKD Stage 3, and Chronic Pain as evidenced by improved management of these chronic diseases verbalize basic understanding of HTN, DMII, CKD Stage 3, and Chronic Pain disease process and self health management plan as evidenced by improved management of these chronic diseases. take all medications exactly as prescribed and will call provider for medication related questions as evidenced by being compliant with all medications    attend all scheduled medical appointments: need to schedule post hospital visits with cholecystectomy surgeon and PCP; scheduled PCP office visit on 02/19/22 as evidenced by attending all scheduled appointments         demonstrate improved adherence to prescribed treatment plan for HTN, DMII, CKD Stage 3, and Chronic Pain as evidenced by improved A1C results, blood pressure readings are < 130/80 and patient's pain is managed effectively. continue to work with Consulting civil engineer and/or Social Worker to address care management and care coordination needs related to HTN, DMII, CKD Stage 3, and Chronic Pain as evidenced by adherence to CM Team Scheduled appointments     work with pharmacist to address complex medication regimen related to HTN, DMII, CKD Stage 3, and Chronic Pain as evidenced by review of EMR and patient or pharmacist report    through collaboration with RN Care manager, provider, and care team.   Interventions: Inter-disciplinary care team  collaboration (see longitudinal plan of care) Evaluation of current treatment plan related to  self management and patient's adherence to plan as established by provider   Chronic Kidney Disease (Status: New goal.)  Long Term Goal  Last practice recorded BP readings:  BP Readings from Last 3 Encounters:  12/21/21 (!) 161/71  11/13/21 (!) 195/92  11/06/21 (!) 189/92   Lab Results  Component Value Date   NA 139 12/21/2021   K 4.3 12/21/2021   CO2 17 (L) 12/21/2021   GLUCOSE 248 (H) 12/21/2021   BUN 46 (H) 12/21/2021   CREATININE 2.82 (H) 12/21/2021   CALCIUM 8.1 (L) 12/21/2021   EGFR 36 (L) 06/27/2021   GFRNONAA 21 (L) 12/21/2021    Most recent eGFR/CrCl:  Lab Results  Component Value Date   EGFR 36 (L) 06/27/2021    No components found for: CRCL  Assessed the patient     understanding of chronic kidney disease    Evaluation of current treatment plan related to chronic kidney disease self management and patient's adherence to plan as established by provider      Reviewed medications with patient and discussed importance of compliance    Advised patient, providing education and rationale, to monitor blood pressure daily and record, calling PCP for  findings outside established parameters    Reviewed scheduled/upcoming provider appointments including    Discussed plans with patient for ongoing care management follow up and provided patient with direct contact information for care management team    Screening for signs and symptoms of depression related to chronic disease state      Assessed social determinant of health barriers    Discussed how patient was diagnosed with Acute Kidney Injury (AKI) during her recent hospitalization 12/17/21 - 12/21/21 and how her body has gone through a great deal of stress recently.   Diabetes:  (Status: New goal.) Long Term Goal   Lab Results  Component Value Date   HGBA1C 8.9 (A) 06/27/2021  Assessed patient's understanding of A1c goal: <7% Reviewed medications with patient and discussed importance of medication adherence;        Discussed plans with patient for ongoing care management follow up and provided patient with direct contact information for care management team;      Reviewed scheduled/upcoming provider appointments including: See RNCM Clinical Goals above;         Advised patient, providing education and rationale, to check cbg three times daily and record        call provider for findings outside established parameters;       Referral made to pharmacy team for assistance with complex medication regimen;       Screening for signs and symptoms of depression related to chronic disease state;        Assessed social determinant of health barriers;        Patient's blood sugar range during recent hospitalization was in Mid to Upper 200.  Patient reports her recent blood sugar range at home has been in the 100 range. Noted decrease in Januvia to 25 mg daily due to renal dysfunction.  Hypertension: (Status: New goal.) Long Term Goal  Last practice recorded BP readings:  BP Readings from Last 3 Encounters:  12/21/21 (!) 161/71  11/13/21 (!) 195/92  11/06/21 (!) 189/92  Most recent eGFR/CrCl:   Lab Results  Component Value Date   EGFR 36 (L) 06/27/2021    No components found for: CRCL  Evaluation of current treatment plan related to hypertension  self management and patient's adherence to plan as established by provider;   Reviewed prescribed diet Heart Healthy, low sodium, low carbohydrates Reviewed medications with patient and discussed importance of compliance;  Counseled on the importance of exercise goals with target of 150 minutes per week Discussed plans with patient for ongoing care management follow up and provided patient with direct contact information for care management team; Advised patient, providing education and rationale, to monitor blood pressure daily and record, calling PCP for findings outside established parameters;  Reviewed scheduled/upcoming provider appointments including:  Screening for signs and symptoms of depression related to chronic disease state;  Assessed social determinant of health barriers;  Patient has blood pressure monitor at home and checks blood pressure usually 1 - 2 times/week.  Patient did not report any recent blood pressure readings since she has not checked her blood pressure since getting home after hospitalization.  This RN encouraged her to monitor her blood pressure regularly especially with medication changes post hospital discharge.  We reviewed hospital discharge orders: Hold Hydrochlorothiazine 25 mg tablet and Olmesartan (Benicar) 20 mg tablet; resume hydralazine and coreg.  Patient verbalized understanding.  Pain:  (Status: New goal.) Long Term Goal  Pain assessment performed. Patient reports chronic pain in bilateral hips and legs with pain radiating down Left leg more.  Chronic pain due to long term scoliosis and multiple back surgeries throughout her life.  Patient rates pain as a "8" on pain scale of 0 - 10. Medications reviewed Reviewed provider established plan for pain management; Discussed importance of adherence to all  scheduled medical appointments; Counseled on the importance of reporting any/all new or changed pain symptoms or management strategies to pain management provider; Reviewed with patient prescribed pharmacological and nonpharmacological pain relief strategies; Screening for signs and symptoms of depression related to chronic disease state;  Assessed social determinant of health barriers;  Medication change post hospital discharge: Gabapentin reduced to 400 mg 1 tablet BID due to renal dysfunction.  Patient Goals/Self-Care Activities: Take medications as prescribed   Attend all scheduled provider appointments Call pharmacy for medication refills 3-7 days in advance of running out of medications Call provider office for new concerns or questions  Work with [facility] Pharmacist regarding complex medication regimen check blood sugar at prescribed times: three times daily take the blood sugar meter to all doctor visits check blood pressure 3 times per week take blood pressure log to all doctor appointments call doctor for signs and symptoms of high blood pressure limit salt intake to 2000 mg/day       Follow Up:  Patient agrees to Care Plan and Follow-up.  Plan: The Managed Medicaid care management team will reach out to the patient again over the next 40 days.  Date/time of next scheduled RN care management/care coordination outreach:  January 30, 2022 at 2:00 pm  Salvatore Marvel RN, [facility] Mobile: [phone]

## 2021-12-28 ENCOUNTER — Other Ambulatory Visit: Payer: Self-pay

## 2021-12-28 MED ORDER — ACETAMINOPHEN ER 650 MG PO TBCR: EXTENDED_RELEASE_TABLET | ORAL | 0 refills | Status: DC

## 2021-12-28 MED ORDER — TIZANIDINE HCL 4 MG PO TABS
ORAL_TABLET | ORAL | 2 refills | Status: DC
  Filled 2021-12-28: qty 120, 30d supply, fill #0

## 2021-12-28 MED ORDER — OXYCODONE HCL 10 MG PO TABS: ORAL_TABLET | ORAL | 0 refills | Status: DC

## 2021-12-28 MED ORDER — GABAPENTIN 400 MG PO CAPS
ORAL_CAPSULE | ORAL | 0 refills | Status: AC
  Filled 2021-12-28: qty 90, 30d supply, fill #0

## 2021-12-28 MED ORDER — VITAMIN D (ERGOCALCIFEROL) 1.25 MG (50000 UNIT) PO CAPS
ORAL_CAPSULE | ORAL | 2 refills | Status: AC
  Filled 2021-12-28: qty 4, 28d supply, fill #0

## 2021-12-28 MED ORDER — OXYCODONE HCL 10 MG PO TABS
ORAL_TABLET | ORAL | 0 refills | Status: AC
  Filled 2022-01-22: qty 75, 15d supply, fill #0

## 2022-01-04 ENCOUNTER — Other Ambulatory Visit: Payer: Self-pay

## 2022-01-04 ENCOUNTER — Ambulatory Visit: Payer: Self-pay

## 2022-01-08 ENCOUNTER — Other Ambulatory Visit: Payer: Self-pay | Admitting: Pharmacist

## 2022-01-08 DIAGNOSIS — I10 Essential (primary) hypertension: Secondary | ICD-10-CM

## 2022-01-08 NOTE — Chronic Care Management (AMB) (Signed)
Chief Complaint  Patient presents with   Diabetes   Hypertension   Hyperlipidemia    Briana Williams is a 41 y.o. year old female who presented for a telephone visit.   They were referred to the pharmacist by their High Risk Managed Medicaid Care Team  for assistance in managing complex medication management.   Patient is participating in a Managed Medicaid Plan:  Yes  Subjective:  Care Team: Primary Care Provider: Ladell Pier, MD ; Next Scheduled Visit: 02/09/22  Medication Access/Adherence  Current Pharmacy:  [facility] [address], [place] [address] Phone: [phone] Fax: [facility], [place] - [name] [place] [address] [name] [name] [place] [address] Phone: [phone] Fax: [phone]  [id number] [place] - [address] [place] [address]Phone: [phone]: [phone] [facility] [address]: [phone]: [phone]  Patient reports affordability concerns with their medications: No  Patient reports access/transportation concerns to their pharmacy: No  Patient reports adherence concerns with their medications:  Yes  several medications have not been refilled in years. Patient reports that she has not stayed up to date with her health as she has been focused on taking care of her husband.    Diabetes- though no diagnosis present in Problem List  Current medications: Januvia 25 mg daily (dose reduced at recent hospitalization), prescribed Lantus 38 units daily and Novolog 15 units with meals, but these have not been refilled in several years.  Medications tried in the past: metformin (noted intolerance - skin blisters);   Last A1c from 2022, but  glucose readings in 200s while hospitalized.  Hypertension:  Current medications: olmesartan 20 mg (was told to stop at hospital discharge but continued at home); prescribed carvedilol 12.5 mg twice daily and hydralazine 10 mg twice daily, but has not filled either of these in several months.   AKI while hospitalized - olmesartan and HCTZ were held on discharge due to AKI, eGFR 21   Hyperlipidemia/ASCVD Risk Reduction  Current lipid lowering medications: atorvastatin 10 mg daily - but has not filled in several years  Depression/Anxiety:  Current medications: prescribed escitalopram 20 mg, amitriptyline 10 mg daily, buspirone 10 mg three times daily - but has not filled escitalopram or buspirone in several months.  Pain Management:  Current medications: oxycodone 10 mg up to 5 times daily, tizanidine 4 mg up to 4 times daily     Health Maintenance  Health Maintenance Due  Topic Date Due   Hepatitis C Screening  Never done   FOOT EXAM  01/02/2018   HEMOGLOBIN A1C  12/25/2021     Objective: Lab Results  Component Value Date   HGBA1C 8.9 (A) 06/27/2021    Lab Results  Component Value Date   CREATININE 2.82 (H) 12/21/2021   BUN 46 (H) 12/21/2021   NA 139 12/21/2021   K 4.3 12/21/2021   CL 115 (H) 12/21/2021   CO2 17 (L) 12/21/2021    Lab Results  Component Value Date   CHOL 198 08/11/2020   HDL 62 08/11/2020   LDLCALC 116 (H) 08/11/2020   TRIG 112 08/11/2020   CHOLHDL 3.2 08/11/2020    Medications Reviewed Today  Reviewed by Osker Mason, RPH-CPP (Pharmacist) on 01/08/22 at 1109  Med List Status: <None>   Medication Order Taking? Sig Documenting Provider Last Dose Status Informant  ACCU-CHEK SOFTCLIX LANCETS lancets [id number]  1 each by Other route 3 (three) times daily. ICD 10 E11.9 [name], MD  Active Self  Accu-Chek Softclix Lancets lancets [id number]  Use as instructed Ladell Pier, MD  Active Self  acetaminophen (8 HOUR PAIN  RELIEVER) 650 MG CR tablet [id number] No 1 (one) tablet by mouth three times daily, as needed  Patient not taking: Reported on 01/08/2022    Not Taking Active   amitriptyline (ELAVIL) 25 MG tablet [id number] Yes TAKE 1 TABLET(25 MG) BY MOUTH AT BEDTIME Ladell Pier, MD Taking Active Self  atorvastatin (LIPITOR) 10 MG tablet [id number] No Take 1 tablet (10 mg total) by mouth daily.  Patient not taking: Reported on 01/08/2022   Ladell Pier, MD Not Taking Active Self  Blood Glucose Monitoring Suppl (ACCU-CHEK AVIVA PLUS) w/Device KIT [id number] No 1 Device by Does not apply route 3 (three) times daily after meals. ICD 10 E11.9  Patient not taking: Reported on 01/08/2022   [name], MD Not Taking Active Self  Blood Glucose Monitoring Suppl (ACCU-CHEK GUIDE) w/Device KIT [id number] No Use as directed  Patient not taking: Reported on 01/08/2022   Ladell Pier, MD Not Taking Active Self  busPIRone (BUSPAR) 10 MG tablet [id number] No TAKE 1 TABLET(10 MG) BY MOUTH THREE TIMES DAILY  Patient not taking: Reported on 01/08/2022   Ladell Pier, MD Not Taking Active Self  carvedilol (COREG) 12.5 MG tablet [id number] No Take 1 tablet (12.5 mg total) by mouth 2 (two) times daily with a meal.  Patient not taking: Reported on 01/08/2022   Ladell Pier, MD Not Taking Active Self  escitalopram (LEXAPRO) 20 MG tablet [id number] No TAKE 1 TABLET(20 MG) BY MOUTH DAILY  Patient not taking: Reported on 01/08/2022   Ladell Pier, MD Not Taking Active Self  ferrous sulfate 325 (65 FE) MG tablet [id number] No Take 1 tablet (325 mg total) by mouth daily with breakfast.  Patient not taking: Reported on 01/08/2022   Ladell Pier, MD Not Taking Active Self  gabapentin (NEURONTIN) 400 MG capsule [id number] No Take 1 capsule by mouth three times daily  Patient not taking: Reported on 01/08/2022    Not Taking Active   glucose blood (ACCU-CHEK AVIVA PLUS) test strip [id number] No 1 each by  Other route 3 (three) times daily. ICD 10 E11.9  Patient not taking: Reported on 01/08/2022   [name], MD Not Taking Active Self  glucose blood (ACCU-CHEK GUIDE) test strip [id number] No Test 3x/day before meals  Patient not taking: Reported on 01/08/2022   Ladell Pier, MD Not Taking Active Self  hydrALAZINE (APRESOLINE) 10 MG tablet [id number] No Take 1 tablet (10 mg total) by mouth 2 (two) times daily.  Patient not taking: Reported on 01/08/2022   Ladell Pier, MD Not Taking Active Self  Insulin Glargine (LANTUS SOLOSTAR) 100 UNIT/ML Solostar Pen [id number] No Inject 38 Units into the skin daily.  Patient not taking: Reported on 01/08/2022   Ladell Pier, MD Not Taking Active Self  Insulin Pen Needle (B-D ULTRAFINE III SHORT PEN) 31G X 8 MM MISC [id number] No 1 application by Does not apply route daily.  Patient not taking: Reported on 01/08/2022   [name], MD Not Taking Active Self  Lancet Devices [facility]) lancets  [id number] No 1 each by Other route 3 (three) times daily. ICD 10 E11.9  Patient not taking: Reported on 01/08/2022   [name], MD Not Taking Active Self  naloxone [facility]) nasal spray 4 mg/0.1 mL [id number] No Inhale 1 spray as needed.  Patient not taking: Reported on 01/08/2022    Not Taking Active   norethindrone (MICRONOR) 0.35 MG tablet [id number] Yes Take 1 tablet by mouth daily. [provider] Taking Active   NOVOLOG FLEXPEN 100 UNIT/ML FlexPen [id number] No INJECT 15 UNITS INTO THE SKIN WITH THE TWO LARGEST MEALS OF THE DAY.  Patient not taking: Reported on 01/08/2022   Ladell Pier, MD Not Taking Active Self  olmesartan (BENICAR) 20 MG tablet [id number] Yes Take 20 mg by mouth daily. [provider] Taking Active   ondansetron (ZOFRAN-ODT) 4 MG disintegrating tablet [id number] Yes Take 1 tablet (4 mg total) by mouth every 8 (eight) hours as needed. [name], MD Taking Active   oxyCODONE  (ROXICODONE) 5 MG immediate release tablet [id number] No Take 1 tablet (5 mg total) by mouth every 6 (six) hours as needed for severe pain or breakthrough pain.  Patient not taking: Reported on 01/08/2022   [name], PA-C Not Taking Active   Oxycodone HCl 10 MG TABS [id number] Yes Take 10 mg by mouth 5 (five) times daily. [provider] Taking Active Self           Med Note Jodi Mourning, Perrie Ragin T   Tue Jan 08, 2022 11:00 AM)    Oxycodone HCl 10 MG TABS [id number] No 1 (one) Tablet 5 x a day as needed  Patient not taking: Reported on 01/08/2022    Not Taking Active   Oxycodone HCl 10 MG TABS [id number] No 1 (one) Tablet 5 x a day as needed  Patient not taking: Reported on 01/08/2022    Not Taking Active   sitaGLIPtin (JANUVIA) 25 MG tablet [id number] Yes Take 1 tablet (25 mg total) by mouth daily. [name], MD Taking Active   SUMAtriptan (IMITREX) 100 MG tablet [id number] No Take 1 tablet (100 mg total) by mouth once as needed for up to 1 dose for migraine. May repeat in 2 hours if headache persists or recurs.  Patient not taking: Reported on 01/08/2022   Marcial Pacas, MD Not Taking Active Self  tiZANidine (ZANAFLEX) 4 MG tablet [id number] Yes TAKE 1 TABLET BY MOUTH EVERY 8 HOURS AS NEEDED Ladell Pier, MD Taking Active Self  tiZANidine (ZANAFLEX) 4 MG tablet [id number] No Take 1 (one) Tablet by mouth four times daily, as needed  Patient not taking: Reported on 01/08/2022    Not Taking Active   Vitamin D, Ergocalciferol, (DRISDOL) 1.25 MG (50000 UNIT) CAPS capsule [id number] Yes Take 1 capsule by mouth once a week  Taking Active               Assessment/Plan:   Discussed with embedded pharmacist. Will staff message PCP to decide next steps. Patient has not had a hospital follow up with recommended lab work s/p AKI, PCP does not have an opening until her scheduled visit in July. Would be concerned to refill everything that she has not been taking - unclear glycemic control,  unclear blood pressure control and renal status, and risk of serotonin syndrome with reinitiating escitalopram and buspirone with background amitriptyline.   Will call patient tomorrow.   Catie Hedwig Morton, PharmD, [facility] [phone]

## 2022-01-09 ENCOUNTER — Telehealth: Payer: Medicaid Other | Admitting: Physician Assistant

## 2022-01-09 ENCOUNTER — Encounter: Payer: Self-pay | Admitting: Physician Assistant

## 2022-01-09 ENCOUNTER — Ambulatory Visit: Payer: Medicaid Other

## 2022-01-09 ENCOUNTER — Ambulatory Visit: Payer: Self-pay | Admitting: *Deleted

## 2022-01-09 ENCOUNTER — Other Ambulatory Visit: Payer: Self-pay | Admitting: Internal Medicine

## 2022-01-09 ENCOUNTER — Other Ambulatory Visit: Payer: Self-pay | Admitting: Pharmacist

## 2022-01-09 DIAGNOSIS — G894 Chronic pain syndrome: Secondary | ICD-10-CM

## 2022-01-09 DIAGNOSIS — E669 Obesity, unspecified: Secondary | ICD-10-CM

## 2022-01-09 DIAGNOSIS — F419 Anxiety disorder, unspecified: Secondary | ICD-10-CM

## 2022-01-09 DIAGNOSIS — F4321 Adjustment disorder with depressed mood: Secondary | ICD-10-CM

## 2022-01-09 DIAGNOSIS — I10 Essential (primary) hypertension: Secondary | ICD-10-CM

## 2022-01-09 DIAGNOSIS — F32A Depression, unspecified: Secondary | ICD-10-CM

## 2022-01-09 MED ORDER — NOVOLOG FLEXPEN 100 UNIT/ML ~~LOC~~ SOPN: PEN_INJECTOR | SUBCUTANEOUS | 6 refills | Status: AC

## 2022-01-09 MED ORDER — HYDROXYZINE HCL 25 MG PO TABS: ORAL_TABLET | ORAL | 1 refills | Status: AC

## 2022-01-09 MED ORDER — ESCITALOPRAM OXALATE 20 MG PO TABS: ORAL_TABLET | ORAL | 0 refills | Status: AC

## 2022-01-09 MED ORDER — LANTUS SOLOSTAR 100 UNIT/ML ~~LOC~~ SOPN: 38.0000 [IU] | PEN_INJECTOR | Freq: Every day | SUBCUTANEOUS | 11 refills | Status: AC

## 2022-01-09 MED ORDER — BUSPIRONE HCL 10 MG PO TABS: ORAL_TABLET | ORAL | 1 refills | Status: DC

## 2022-01-09 MED ORDER — HYDRALAZINE HCL 10 MG PO TABS: 10.0000 mg | ORAL_TABLET | Freq: Two times a day (BID) | ORAL | 4 refills | Status: AC

## 2022-01-09 MED ORDER — CARVEDILOL 12.5 MG PO TABS: 12.5000 mg | ORAL_TABLET | Freq: Two times a day (BID) | ORAL | 3 refills | Status: AC

## 2022-01-09 NOTE — Telephone Encounter (Signed)
Noted Pt has appt scheduled for today

## 2022-01-09 NOTE — Chronic Care Management (AMB) (Addendum)
Collaborated with PCP. She would like patient to stop olmesartan, restart carvedilol and hydralazine as per hospital discharge. She has re-ordered Lantus and Novolog.   Patient was seen by [facility] today and restarted on escitalopram and buspirone.   I called patient to review this. Left voicemail for her to return my call at her convenience.   Catie Hedwig Morton, PharmD, [facility] [phone]

## 2022-01-09 NOTE — Patient Instructions (Signed)
I strongly encourage you to restart the Lexapro and BuSpar.  You can use the hydroxyzine 1 to 2 tablets every 6-8 hours as needed to help you during this trying time.  I encourage you to allow others to take care of you right now, work on making sure you are staying hydrated, getting nutrition and sleep.  I have started a referral for you to be seen by a counselor to help you work through and manage your grief.  Please let us know if there is anything else we can do for you  Kennieth Rad, PA-C Physician Assistant [place] [email or web address]   Managing Loss, Adult People experience loss in many different ways throughout their lives. Events such as moving, changing jobs, and losing friends can create a sense of loss. The loss may be as serious as a major health change, divorce, death of a pet, or death of a loved one. All of these types of loss are likely to create a physical and emotional reaction known as grief. Grief is the result of a major change or an absence of something or someone that you count on. Grief is a normal reaction to loss. A variety of factors can affect your grieving experience, including: The nature of your loss. Your relationship to what or whom you lost. Your understanding of grief and how to manage it. Your support system. Be aware that when grief becomes extreme, it can lead to more severe issues like isolation, depression, anxiety, or suicidal thoughts. Talk with your health care provider if you have any of these issues. How to manage lifestyle changes Keep to your normal routine as much as possible. If you have trouble focusing or doing normal activities, it is acceptable to take some time away from your normal routine. Spend time with friends and loved ones. Eat a healthy diet, get plenty of sleep, and rest when you feel tired. How to recognize changes  The way that you deal with your grief will affect  your ability to function as you normally do. When grieving, you may experience these changes: Numbness, shock, sadness, anxiety, anger, denial, and guilt. Thoughts about death. Unexpected crying. A physical sensation of emptiness in your stomach. Problems sleeping and eating. Tiredness (fatigue). Loss of interest in normal activities. Dreaming about or imagining seeing the person who died. A need to remember what or whom you lost. Difficulty thinking about anything other than your loss for a period of time. Relief. If you have been expecting the loss for a while, you may feel a sense of relief when it happens. Follow these instructions at home: Activity Express your feelings in healthy ways, such as: Talking with others about your loss. It may be helpful to find others who have had a similar loss, such as a support group. Writing down your feelings in a journal. Doing physical activities to release stress and emotional energy. Doing creative activities like painting, sculpting, or playing or listening to music. Practicing resilience. This is the ability to recover and adjust after facing challenges. Reading some resources that encourage resilience may help you to learn ways to practice those behaviors.  General instructions Be patient with yourself and others. Allow the grieving process to happen, and remember that grieving takes time. It is likely that you may never feel completely done with some grief. You may find a way to move on while still cherishing memories and feelings about your loss. Accepting your loss is a process. It can take  months or longer to adjust. Keep all follow-up visits. This is important. Where to find support To get support for managing loss: Ask your health care provider for help and recommendations, such as grief counseling or therapy. Think about joining a support group for people who are managing a loss. Where to find more information You can find more  information about managing loss from: American Society of Clinical Oncology: www.cancer.net American Psychological Association: TVStereos.ch Contact a health care provider if: Your grief is extreme and keeps getting worse. You have ongoing grief that does not improve. Your body shows symptoms of grief, such as illness. You feel depressed, anxious, or hopeless. Get help right away if: You have thoughts about hurting yourself or others. Get help right away if you feel like you may hurt yourself or others, or have thoughts about taking your own life. Go to your nearest emergency room or: Call 911. Call the [place] at (618)596-6601 or 988. This is open 24 hours a day. Text the Crisis Text Line at 802-350-2452. Summary Grief is the result of a major change or an absence of someone or something that you count on. Grief is a normal reaction to loss. The depth of grief and the period of recovery depend on the type of loss and your ability to adjust to the change and process your feelings. Processing grief requires patience and a willingness to accept your feelings and talk about your loss with people who are supportive. It is important to find resources that work for you and to realize that people experience grief differently. There is not one grieving process that works for everyone in the same way. Be aware that when grief becomes extreme, it can lead to more severe issues like isolation, depression, anxiety, or suicidal thoughts. Talk with your health care provider if you have any of these issues. This information is not intended to replace advice given to you by your health care provider. Make sure you discuss any questions you have with your health care provider. Document Revised: 03/05/2021 Document Reviewed: 03/05/2021 Elsevier Patient Education  [place].

## 2022-01-09 NOTE — Progress Notes (Signed)
Established Patient Office Visit  Subjective   Patient ID: Briana Williams, female    DOB: 04/14/1981  Age: 41 y.o. MRN: [id number]  Chief Complaint  Patient presents with   grief  Virtual Visit via Telephone Note  I connected with Briana Williams on 01/09/22 at 10:00 AM EDT by telephone and verified that I am speaking with the correct person using two identifiers.  Location: Patient: Home Provider: [facility]    I discussed the limitations, risks, security and privacy concerns of performing an evaluation and management service by telephone and the availability of in person appointments. I also discussed with the patient that there may be a patient responsible charge related to this service. The patient expressed understanding and agreed to proceed.   History of Present Illness: States that she has been having "extreme anxiety" states that her husband passed away yesterday early afternoon, states that she was present and did attempt CPR on him.  States that she has not been able to rest and has only slept 45 minutes.  States that she does have family present with her.  Self reports that she stopped taking care of herself when he had COVID and was in the ICU.  States that she has not been taking Lexapro or BuSpar "in quite some time.  States she does not have any supply of that medication at home.  States that she does take her oxycodone for her chronic pain as needed, states that it is prescribed 5 times a day, states the last time she took it was last night at 10 PM, but states that she generally takes 3-4 a day.     Observations/Objective: Medical history and current medications reviewed, no physical exam completed     Past Medical History:  Diagnosis Date   Arthritis    GERD (gastroesophageal reflux disease)    Headache    migraine 2 weeks ago   Hyperlipidemia    Hypertension associated with diabetes (HCC)    Renal disorder    has both  kidneys, compacted and 'squashed together"   Scoliosis    Sleep apnea    in the midst of being tested for it.   waiting on scheduling    Type 2 diabetes mellitus treated with insulin ([facility])    dx 2000   Social History   Socioeconomic History   Marital status: Married    Spouse name: Not on file   Number of children: 3   Years of education: Not on file   Highest education level: Not on file  Occupational History    Employer: UNEMPLOYED  Tobacco Use   Smoking status: Never   Smokeless tobacco: Never  Vaping Use   Vaping Use: Never used  Substance and Sexual Activity   Alcohol use: No   Drug use: No   Sexual activity: Not Currently    Birth control/protection: Other-see comments    Comment: unable to have children d/t scoliosis  Other Topics Concern   Not on file  Social History Narrative   Not on file   Social Determinants of Health   Financial Resource Strain: Low Risk  (12/27/2021)   Overall Financial Resource Strain (CARDIA)    Difficulty of Paying Living Expenses: Not hard at all  Food Insecurity: No Food Insecurity (12/27/2021)   Hunger Vital Sign    Worried About Running Out of Food in the Last Year: Never true    Ran Out of Food in the Last Year:  Never true  Transportation Needs: No Transportation Needs (12/27/2021)   PRAPARE - Hydrologist (Medical): No    Lack of Transportation (Non-Medical): No  Physical Activity: Inactive (12/27/2021)   Exercise Vital Sign    Days of Exercise per Week: 0 days    Minutes of Exercise per Session: 0 min  Stress: Stress Concern Present (12/27/2021)   [place]    Feeling of Stress : Very much  Social Connections: Socially Integrated (12/27/2021)   Social Connection and Isolation Panel [NHANES]    Frequency of Communication with Friends and Family: More than three times a week    Frequency of Social Gatherings with Friends and Family: More  than three times a week    Attends Religious Services: More than 4 times per year    Active Member of [employer] or Organizations: Yes    Attends Music therapist: More than 4 times per year    Marital Status: Married  Human resources officer Violence: Not on file   Family History  Problem Relation Age of Onset   Cancer Maternal Grandmother        mandibular cancer    Diabetes Mother    Heart failure Mother    CAD Mother        triple CABG   Diabetes Maternal Aunt    Stroke Father    Allergies  Allergen Reactions   Hydrocodone Itching    Note: benadryl does NOT help   Lisinopril Nausea And Vomiting    Caused nausea   Metformin And Related Other (See Comments)    Causes blisters on skin   Norvasc [Amlodipine Besylate] Swelling    Ankle swelling   Other Other (See Comments)    Paper Tape causes Blisters   Ultrasound Gel Rash and Other (See Comments)    Patient gets a red rash with use of ultrasound gel/ burns skin    ROS       Assessment & Plan:   Problem List Items Addressed This Visit       Other   Chronic pain syndrome (Chronic)   Relevant Medications   escitalopram (LEXAPRO) 20 MG tablet   Other Visit Diagnoses     Grief    -  Primary   Relevant Medications   hydrOXYzine (ATARAX) 25 MG tablet   Other Relevant Orders   Ambulatory referral to Psychiatry   Anxiety and depression       Relevant Medications   busPIRone (BUSPAR) 10 MG tablet   escitalopram (LEXAPRO) 20 MG tablet   hydrOXYzine (ATARAX) 25 MG tablet   Other Relevant Orders   Ambulatory referral to Psychiatry       Assessment and Plan: 1. Grief Patient is currently prescribed oxycodone instant release 10 mg 5 times a day.  Did explain to patient that I am unable to prescribe benzodiazepines in this situation due to potential harm.  Patient understands and agrees.  Patient is agreeable to restart her Lexapro and BuSpar, trial hydroxyzine as needed.  Patient agreeable to referral for  CBT. - hydrOXYzine (ATARAX) 25 MG tablet; Take 1-2 tabs PO q6-8 hours for anxiety PRN  Dispense: 60 tablet; Refill: 1 - Ambulatory referral to Psychiatry  2. Anxiety and depression  - busPIRone (BUSPAR) 10 MG tablet; TAKE 1 TABLET(10 MG) BY MOUTH THREE TIMES DAILY  Dispense: 90 tablet; Refill: 1 - escitalopram (LEXAPRO) 20 MG tablet; TAKE 1 TABLET(20 MG) BY MOUTH DAILY  Dispense: 90 tablet; Refill: 0 - hydrOXYzine (ATARAX) 25 MG tablet; Take 1-2 tabs PO q6-8 hours for anxiety PRN  Dispense: 60 tablet; Refill: 1 - Ambulatory referral to Psychiatry  3. Chronic pain syndrome   Follow Up Instructions:    I discussed the assessment and treatment plan with the patient. The patient was provided an opportunity to ask questions and all were answered. The patient agreed with the plan and demonstrated an understanding of the instructions.   The patient was advised to call back or seek an in-person evaluation if the symptoms worsen or if the condition fails to improve as anticipated.  I provided  20 minutes of non-face-to-face time during this encounter.    Return if symptoms worsen or fail to improve.    Loraine Grip Mayers, PA-C

## 2022-01-09 NOTE — Telephone Encounter (Signed)
  Chief Complaint: depression- husband died yesterday, patient sobbing, overwhelmed with grief  Symptoms: depression, life change Frequency:   Pertinent Negatives: Patient denies suicidal thoughts Disposition: [] ED /[] Urgent Care (no appt availability in office) / [] Appointment(In office/virtual)/ []  [facility]/ [] Home Care/ [] Refused Recommended Disposition /[] [facility]/ []  Follow-up with PCP Additional Notes: Contacted office- they are going to schedule virtual visit with patient so she can get some medications to help her anxiety.

## 2022-01-09 NOTE — Telephone Encounter (Signed)
Reason for Disposition . [1] Depression AND [2] worsening (e.g.,sleeping poorly, less able to do activities of daily living)  Answer Assessment - Initial Assessment Questions 1. CONCERN: "What happened that made you call today?"     depression 2. DEPRESSION SYMPTOM SCREENING: "How are you feeling overall?" (e.g., decreased energy, increased sleeping or difficulty sleeping, difficulty concentrating, feelings of sadness, guilt, hopelessness, or worthlessness)     Feelings of sadness- husband died yesterday 3. RISK OF HARM - SUICIDAL IDEATION:  "Do you ever have thoughts of hurting or killing yourself?"  (e.g., yes, no, no but preoccupation with thoughts about death)   - INTENT:  "Do you have thoughts of hurting or killing yourself right NOW?" (e.g., yes, no, N/A)   - PLAN: "Do you have a specific plan for how you would do this?" (e.g., gun, knife, overdose, no plan, N/A)     no 4. RISK OF HARM - HOMICIDAL IDEATION:  "Do you ever have thoughts of hurting or killing someone else?"  (e.g., yes, no, no but preoccupation with thoughts about death)   - INTENT:  "Do you have thoughts of hurting or killing someone right NOW?" (e.g., yes, no, N/A)   - PLAN: "Do you have a specific plan for how you would do this?" (e.g., gun, knife, no plan, N/A)      no 5. FUNCTIONAL IMPAIRMENT: "How have things been going for you overall? Have you had more difficulty than usual doing your normal daily activities?"  (e.g., better, same, worse; self-care, school, work, interactions)     Husband had sick- patient was feeling better after surgery- 6. SUPPORT: "Who is with you now?" "Who do you live with?" "Do you have family or friends who you can talk to?"      Family support 7. THERAPIST: "Do you have a counselor or therapist? Name?"     no 8. STRESSORS: "Has there been any new stress or recent changes in your life?"     Recent death of husband-patient states they have been together since they were 41yo- huge loss for  her, was present and gave CPR to him  Protocols used: Depression-A-AH

## 2022-01-10 ENCOUNTER — Ambulatory Visit: Payer: Self-pay

## 2022-01-10 NOTE — Chronic Care Management (AMB) (Signed)
Called patient to review med changes. Given being in the midst of planning her husband's funeral, she requested I call back next week.   Catie Hedwig Morton, PharmD, [facility] [phone]

## 2022-01-14 ENCOUNTER — Other Ambulatory Visit: Payer: Self-pay | Admitting: Pharmacist

## 2022-01-14 NOTE — Chronic Care Management (AMB) (Signed)
Chief Complaint  Patient presents with   Hypertension   Diabetes    Briana Williams is a 41 y.o. year old female who presented for a telephone visit.   They were referred to the pharmacist by their High Risk Managed Medicaid Care Team  for assistance in managing diabetes and hypertension.   Patient is participating in a Managed Medicaid Plan:  Yes  Subjective:  Care Team: Primary Care Provider: Ladell Pier, MD ; Next Scheduled Visit: 02/19/22  Medication Access/Adherence  Current Pharmacy:  [facility] [address], [place] [address] Phone: [phone] Fax: [facility] [place] - [name] [place] [name] [place] [address] Phone: [phone] Fax: [phone]  [id number] [place] - [address] [place] [address]Phone: [phone]: [phone] [facility] [address] [place]: [phone]: [phone]  Patient reports affordability concerns with their medications: No  Patient reports access/transportation concerns to their pharmacy: No  - just hasn't made it by the pharmacy yet to pick them up Patient reports adherence concerns with their medications:  Yes  has not been to pharmacy yet due to husband's passing last week   Diabetes:  Current medications: Januvia 25 mg daily, Lantus 38 units daily, Novolog 15 units three times daily with meals Medications tried in the past: metformin - skin blisters  Current glucose readings: not checking, but confirms she has a working glucometer at home.   Hypertension:  Current medications: currently taking olmesartan, however, PCP would like her to d/c as per hospital discharge information and restart  carvedilol and hydralazine.   Patient has a validated, automated, upper arm home BP cuff but has not been checking recently  Depression/Anxiety:  Current medications: restarting escitalopram 20 mg daily, buspirone 10 mg three times daily, and given hydroxyzine 25 mg PRN   Health Maintenance  Health Maintenance Due  Topic Date Due   Hepatitis C Screening  Never done   FOOT EXAM  01/02/2018   HEMOGLOBIN A1C  12/25/2021     Objective: Lab Results  Component Value Date   HGBA1C 8.9 (A) 06/27/2021    Lab Results  Component Value Date   CREATININE 2.82 (H) 12/21/2021   BUN 46 (H) 12/21/2021   NA 139 12/21/2021   K 4.3 12/21/2021   CL 115 (H) 12/21/2021   CO2 17 (L) 12/21/2021    Lab Results  Component Value Date   CHOL 198 08/11/2020   HDL 62 08/11/2020   LDLCALC 116 (H) 08/11/2020   TRIG 112 08/11/2020   CHOLHDL 3.2 08/11/2020    Medications Reviewed Today     Reviewed by MKennieth RadPA-C (Physician Assistant Certified) on 072/62/03at 1March ARBList Status: <None>   Medication Order Taking? Sig Documenting Provider Last Dose Status Informant  ACCU-CHEK SOFTCLIX LANCETS lancets [id number] 1 each by Other route 3 (three) times daily. ICD 10 E11.9 FBoykin Nearing MD Taking Active Self  Accu-Chek Softclix Lancets lancets [id number] Use as instructed JLadell Pier MD Taking Active Self  acetaminophen (8 HOUR PAIN RELIEVER) 650 MG CR tablet [id number] 1 (one) tablet by mouth three times daily, as needed  Patient not taking: Reported on  01/08/2022    Not Taking Active   amitriptyline (ELAVIL) 25 MG tablet [id number] No TAKE 1 TABLET(25 MG) BY MOUTH AT BEDTIME Ladell Pier, MD Taking Active Self  atorvastatin (LIPITOR) 10 MG tablet [id number] No Take 1 tablet (10 mg total) by mouth daily.  Patient not taking: Reported on 01/08/2022   Ladell Pier, MD Not Taking Active Self  Blood Glucose Monitoring Suppl (ACCU-CHEK AVIVA PLUS) w/Device KIT  [id number] No 1 Device by Does not apply route 3 (three) times daily after meals. ICD 10 E11.9  Patient not taking: Reported on 01/08/2022   [name], MD Not Taking Active Self  Blood Glucose Monitoring Suppl (ACCU-CHEK GUIDE) w/Device KIT [id number] No Use as directed  Patient not taking: Reported on 01/08/2022   Ladell Pier, MD Not Taking Active Self  busPIRone (BUSPAR) 10 MG tablet [id number]  TAKE 1 TABLET(10 MG) BY MOUTH THREE TIMES DAILY Mayers, Cari S, PA-C  Active   carvedilol (COREG) 12.5 MG tablet [id number] No Take 1 tablet (12.5 mg total) by mouth 2 (two) times daily with a meal.  Patient not taking: Reported on 01/08/2022   Ladell Pier, MD Not Taking Active Self  escitalopram (LEXAPRO) 20 MG tablet [id number]  TAKE 1 TABLET(20 MG) BY MOUTH DAILY Mayers, Cari S, PA-C  Active   ferrous sulfate 325 (65 FE) MG tablet [id number] No Take 1 tablet (325 mg total) by mouth daily with breakfast.  Patient not taking: Reported on 01/08/2022   Ladell Pier, MD Not Taking Active Self  gabapentin (NEURONTIN) 400 MG capsule [id number] No Take 1 capsule by mouth three times daily  Patient not taking: Reported on 01/08/2022    Not Taking Active   glucose blood (ACCU-CHEK AVIVA PLUS) test strip [id number] No 1 each by Other route 3 (three) times daily. ICD 10 E11.9  Patient not taking: Reported on 01/08/2022   [name], MD Not Taking Active Self  glucose blood (ACCU-CHEK GUIDE) test strip [id number] No Test 3x/day before meals  Patient not taking: Reported on 01/08/2022   Ladell Pier, MD Not Taking Active Self  hydrALAZINE (APRESOLINE) 10 MG tablet [id number] No Take 1 tablet (10 mg total) by mouth 2 (two) times daily.  Patient not taking: Reported on 01/08/2022   Ladell Pier, MD Not Taking Active Self  hydrOXYzine (ATARAX) 25 MG tablet [id number] Yes Take 1-2 tabs PO q6-8 hours for anxiety PRN Mayers, Cari S, PA-C  Active   Insulin Glargine (LANTUS SOLOSTAR) 100  UNIT/ML Solostar Pen [id number] No Inject 38 Units into the skin daily.  Patient not taking: Reported on 01/08/2022   Ladell Pier, MD Not Taking Active Self  Insulin Pen Needle (B-D ULTRAFINE III SHORT PEN) 31G X 8 MM MISC [id number] No 1 application by Does not apply route daily.  Patient not taking: Reported on 01/08/2022   [name], MD Not Taking Active Self  Lancet Devices [facility]) lancets [id number] No 1 each by Other route 3 (three) times daily. ICD 10 E11.9  Patient not taking: Reported on 01/08/2022   [name], MD Not Taking Active Self  naloxone [facility]) nasal spray 4 mg/0.1 mL [id number] No Inhale 1 spray as needed.  Patient not taking: Reported on 01/08/2022    Not Taking Active   norethindrone (MICRONOR) 0.35 MG tablet [id number] No Take 1 tablet by mouth daily. [provider] Taking Active   NOVOLOG FLEXPEN 100 UNIT/ML FlexPen [id number] No INJECT 15 UNITS INTO THE SKIN WITH  THE TWO LARGEST MEALS OF THE DAY.  Patient not taking: Reported on 01/08/2022   Ladell Pier, MD Not Taking Active Self  olmesartan (BENICAR) 20 MG tablet [id number] No Take 20 mg by mouth daily. [provider] Taking Active   ondansetron (ZOFRAN-ODT) 4 MG disintegrating tablet [id number] No Take 1 tablet (4 mg total) by mouth every 8 (eight) hours as needed. [name], MD Taking Active   oxyCODONE (ROXICODONE) 5 MG immediate release tablet [id number] No Take 1 tablet (5 mg total) by mouth every 6 (six) hours as needed for severe pain or breakthrough pain.  Patient not taking: Reported on 01/08/2022   [name], PA-C Not Taking Active   Oxycodone HCl 10 MG TABS [id number] No Take 10 mg by mouth 5 (five) times daily. [provider] Taking Active Self           Med Note Jodi Mourning, [place] Kitner T   Tue Jan 08, 2022 11:00 AM)    Oxycodone HCl 10 MG TABS [id number] No 1 (one) Tablet 5 x a day as needed  Patient not taking: Reported on 01/08/2022    Not  Taking Active   Oxycodone HCl 10 MG TABS [id number] No 1 (one) Tablet 5 x a day as needed  Patient not taking: Reported on 01/08/2022    Not Taking Active   sitaGLIPtin (JANUVIA) 25 MG tablet [id number] No Take 1 tablet (25 mg total) by mouth daily. [name], MD Taking Active   tiZANidine (ZANAFLEX) 4 MG tablet [id number] No TAKE 1 TABLET BY MOUTH EVERY 8 HOURS AS NEEDED Ladell Pier, MD Taking Active Self  tiZANidine (ZANAFLEX) 4 MG tablet [id number] No Take 1 (one) Tablet by mouth four times daily, as needed  Patient not taking: Reported on 01/08/2022    Not Taking Active   Vitamin D, Ergocalciferol, (DRISDOL) 1.25 MG (50000 UNIT) CAPS capsule [id number] No Take 1 capsule by mouth once a week  Taking Active               Assessment/Plan:   Diabetes: - Currently uncontrolled - Recommend to restart insulin as prescribed.   - Recommend to check glucose twice daily, fasting and two hour post prandial  Hypertension: - Currently uncontrolled - Reviewed to discontinued olmesartan and restart hydralazine and carvedilol as prescribed - Recommended to check home blood pressure and heart rate daily  Depression/Anxiety: - Currently uncontrolled - Recommend to restart therapy as prescribed     Follow Up Plan: phone call in 3 weeks  Briana Williams, PharmD, [facility] [phone]

## 2022-01-14 NOTE — Patient Instructions (Addendum)
Hi Briana Williams,   Please restart:  Diabetes: - Lantus 38 units daily - Novolog 15 units before your two largest meals of the day  Blood pressure: STOP olmesartan. Restart:  - carvdeilol 12.5 mg twice daily - hydralazine 10 mg twice daily  Depression/Anxiety - escitalopram 20 mg daily - buspirone 10 mg three times daily - hydroxyzine 25 mg 1-2 tablets every 6 hours as needed for anxiety-  this can make you sleepy  Check your blood sugars twice daily:  1) Fasting, first thing in the morning before breakfast and  2) 2 hours after your largest meal.   For a goal A1c of less than 7%, goal fasting readings are less than 130 and goal 2 hour after meal readings are less than 180.   Check your blood pressure once daily, and any time you have concerning symptoms like headache, chest pain, dizziness, shortness of breath, or vision changes.   Our goal is less than 130/80.  To appropriately check your blood pressure, make sure you do the following:  1) Avoid caffeine, exercise, or tobacco products for 30 minutes before checking. Empty your bladder. 2) Sit with your back supported in a flat-backed chair. Rest your arm on something flat (arm of the chair, table, etc). 3) Sit still with your feet flat on the floor, resting, for at least 5 minutes.  4) Check your blood pressure. Take 1-2 readings.  5) Write down these readings and bring with you to any provider appointments.  Bring your home blood pressure machine with you to a provider's office for accuracy comparison at least once a year.   Make sure you take your blood pressure medications before you come to any office visit, even if you were asked to fast for labs.    Please let me know if you have any questions or concerns. Thanks!  Catie Hedwig Morton, PharmD, [facility] [phone]

## 2022-01-22 ENCOUNTER — Other Ambulatory Visit: Payer: Self-pay

## 2022-01-23 ENCOUNTER — Other Ambulatory Visit: Payer: Self-pay

## 2022-01-28 DIAGNOSIS — G8929 Other chronic pain: Secondary | ICD-10-CM | POA: Diagnosis not present

## 2022-01-28 DIAGNOSIS — M1990 Unspecified osteoarthritis, unspecified site: Secondary | ICD-10-CM | POA: Diagnosis not present

## 2022-01-28 DIAGNOSIS — E119 Type 2 diabetes mellitus without complications: Secondary | ICD-10-CM | POA: Diagnosis not present

## 2022-01-28 DIAGNOSIS — I10 Essential (primary) hypertension: Secondary | ICD-10-CM | POA: Diagnosis not present

## 2022-01-28 DIAGNOSIS — Z79899 Other long term (current) drug therapy: Secondary | ICD-10-CM | POA: Diagnosis not present

## 2022-01-28 DIAGNOSIS — G894 Chronic pain syndrome: Secondary | ICD-10-CM | POA: Diagnosis not present

## 2022-01-28 DIAGNOSIS — M549 Dorsalgia, unspecified: Secondary | ICD-10-CM | POA: Diagnosis not present

## 2022-01-28 DIAGNOSIS — E559 Vitamin D deficiency, unspecified: Secondary | ICD-10-CM | POA: Diagnosis not present

## 2022-01-28 DIAGNOSIS — M542 Cervicalgia: Secondary | ICD-10-CM | POA: Diagnosis not present

## 2022-01-28 DIAGNOSIS — R030 Elevated blood-pressure reading, without diagnosis of hypertension: Secondary | ICD-10-CM | POA: Diagnosis not present

## 2022-01-28 DIAGNOSIS — Z6841 Body Mass Index (BMI) 40.0 and over, adult: Secondary | ICD-10-CM | POA: Diagnosis not present

## 2022-01-30 ENCOUNTER — Other Ambulatory Visit: Payer: Self-pay

## 2022-01-30 ENCOUNTER — Ambulatory Visit: Payer: Medicaid Other

## 2022-01-30 MED ORDER — ACETAMINOPHEN ER 650 MG PO TBCR: EXTENDED_RELEASE_TABLET | ORAL | 0 refills | Status: AC

## 2022-02-02 ENCOUNTER — Other Ambulatory Visit: Payer: Self-pay

## 2022-02-02 ENCOUNTER — Emergency Department (HOSPITAL_BASED_OUTPATIENT_CLINIC_OR_DEPARTMENT_OTHER): Payer: Medicaid Other | Admitting: Radiology

## 2022-02-02 ENCOUNTER — Emergency Department (HOSPITAL_BASED_OUTPATIENT_CLINIC_OR_DEPARTMENT_OTHER): Payer: Medicaid Other

## 2022-02-02 ENCOUNTER — Observation Stay (HOSPITAL_BASED_OUTPATIENT_CLINIC_OR_DEPARTMENT_OTHER)
Admission: EM | Admit: 2022-02-02 | Discharge: 2022-02-05 | Disposition: A | Discharge status: Home / Self Care | Payer: Medicaid Other | Attending: Emergency Medicine | Admitting: Emergency Medicine

## 2022-02-02 ENCOUNTER — Encounter (HOSPITAL_BASED_OUTPATIENT_CLINIC_OR_DEPARTMENT_OTHER): Payer: Self-pay | Admitting: Emergency Medicine

## 2022-02-02 DIAGNOSIS — N184 Chronic kidney disease, stage 4 (severe): Secondary | ICD-10-CM

## 2022-02-02 DIAGNOSIS — Z7984 Long term (current) use of oral hypoglycemic drugs: Secondary | ICD-10-CM | POA: Insufficient documentation

## 2022-02-02 DIAGNOSIS — S199XXA Unspecified injury of neck, initial encounter: Secondary | ICD-10-CM | POA: Diagnosis not present

## 2022-02-02 DIAGNOSIS — F332 Major depressive disorder, recurrent severe without psychotic features: Secondary | ICD-10-CM | POA: Diagnosis present

## 2022-02-02 DIAGNOSIS — I10 Essential (primary) hypertension: Secondary | ICD-10-CM

## 2022-02-02 DIAGNOSIS — G959 Disease of spinal cord, unspecified: Secondary | ICD-10-CM

## 2022-02-02 DIAGNOSIS — R7989 Other specified abnormal findings of blood chemistry: Secondary | ICD-10-CM | POA: Insufficient documentation

## 2022-02-02 DIAGNOSIS — Z79899 Other long term (current) drug therapy: Secondary | ICD-10-CM | POA: Insufficient documentation

## 2022-02-02 DIAGNOSIS — I129 Hypertensive chronic kidney disease with stage 1 through stage 4 chronic kidney disease, or unspecified chronic kidney disease: Secondary | ICD-10-CM | POA: Diagnosis not present

## 2022-02-02 DIAGNOSIS — D631 Anemia in chronic kidney disease: Secondary | ICD-10-CM | POA: Diagnosis not present

## 2022-02-02 DIAGNOSIS — Z794 Long term (current) use of insulin: Secondary | ICD-10-CM | POA: Diagnosis not present

## 2022-02-02 DIAGNOSIS — E1122 Type 2 diabetes mellitus with diabetic chronic kidney disease: Secondary | ICD-10-CM | POA: Diagnosis not present

## 2022-02-02 DIAGNOSIS — G894 Chronic pain syndrome: Secondary | ICD-10-CM

## 2022-02-02 DIAGNOSIS — R55 Syncope and collapse: Secondary | ICD-10-CM | POA: Diagnosis not present

## 2022-02-02 DIAGNOSIS — Z6841 Body Mass Index (BMI) 40.0 and over, adult: Secondary | ICD-10-CM | POA: Diagnosis not present

## 2022-02-02 DIAGNOSIS — Z981 Arthrodesis status: Secondary | ICD-10-CM | POA: Insufficient documentation

## 2022-02-02 DIAGNOSIS — E1129 Type 2 diabetes mellitus with other diabetic kidney complication: Secondary | ICD-10-CM | POA: Diagnosis present

## 2022-02-02 DIAGNOSIS — S0990XA Unspecified injury of head, initial encounter: Secondary | ICD-10-CM | POA: Diagnosis not present

## 2022-02-02 DIAGNOSIS — M25512 Pain in left shoulder: Secondary | ICD-10-CM | POA: Diagnosis not present

## 2022-02-02 DIAGNOSIS — M4324 Fusion of spine, thoracic region: Secondary | ICD-10-CM | POA: Diagnosis not present

## 2022-02-02 DIAGNOSIS — R600 Localized edema: Secondary | ICD-10-CM | POA: Diagnosis not present

## 2022-02-02 DIAGNOSIS — F32A Depression, unspecified: Secondary | ICD-10-CM | POA: Diagnosis not present

## 2022-02-02 LAB — CBC
HCT: 30.1 % — ABNORMAL LOW (ref 36.0–46.0)
Hemoglobin: 9.7 g/dL — ABNORMAL LOW (ref 12.0–15.0)
MCH: 29.9 pg (ref 26.0–34.0)
MCHC: 32.2 g/dL (ref 30.0–36.0)
MCV: 92.9 fL (ref 80.0–100.0)
Platelets: 145 10*3/uL — ABNORMAL LOW (ref 150–400)
RBC: 3.24 MIL/uL — ABNORMAL LOW (ref 3.87–5.11)
RDW: 13.1 % (ref 11.5–15.5)
WBC: 7.1 10*3/uL (ref 4.0–10.5)
nRBC: 0 % (ref 0.0–0.2)

## 2022-02-02 LAB — URINALYSIS, ROUTINE W REFLEX MICROSCOPIC
Bilirubin Urine: NEGATIVE
Glucose, UA: 250 mg/dL — AB
Ketones, ur: NEGATIVE mg/dL
Leukocytes,Ua: NEGATIVE
Nitrite: NEGATIVE
Protein, ur: >300 mg/dL — AB
Specific Gravity, Urine: 1.012 (ref 1.005–1.030)
pH: 6.5 (ref 5.0–8.0)

## 2022-02-02 LAB — CBC WITH DIFFERENTIAL/PLATELET
Abs Immature Granulocytes: 0.02 10*3/uL (ref 0.00–0.07)
Basophils Absolute: 0 10*3/uL (ref 0.0–0.1)
Basophils Relative: 0 %
Eosinophils Absolute: 0.2 10*3/uL (ref 0.0–0.5)
Eosinophils Relative: 4 %
HCT: 30.9 % — ABNORMAL LOW (ref 36.0–46.0)
Hemoglobin: 10.1 g/dL — ABNORMAL LOW (ref 12.0–15.0)
Immature Granulocytes: 0 %
Lymphocytes Relative: 34 %
Lymphs Abs: 2 10*3/uL (ref 0.7–4.0)
MCH: 30.7 pg (ref 26.0–34.0)
MCHC: 32.7 g/dL (ref 30.0–36.0)
MCV: 93.9 fL (ref 80.0–100.0)
Monocytes Absolute: 0.4 10*3/uL (ref 0.1–1.0)
Monocytes Relative: 7 %
Neutro Abs: 3.1 10*3/uL (ref 1.7–7.7)
Neutrophils Relative %: 55 %
Platelets: 152 10*3/uL (ref 150–400)
RBC: 3.29 MIL/uL — ABNORMAL LOW (ref 3.87–5.11)
RDW: 13 % (ref 11.5–15.5)
WBC: 5.8 10*3/uL (ref 4.0–10.5)
nRBC: 0 % (ref 0.0–0.2)

## 2022-02-02 LAB — RETICULOCYTES
Immature Retic Fract: 9.4 % (ref 2.3–15.9)
RBC.: 3.34 MIL/uL — ABNORMAL LOW (ref 3.87–5.11)
Retic Count, Absolute: 68.8 10*3/uL (ref 19.0–186.0)
Retic Ct Pct: 2.1 % (ref 0.4–3.1)

## 2022-02-02 LAB — TROPONIN I (HIGH SENSITIVITY)
Troponin I (High Sensitivity): 5 ng/L (ref <18)
Troponin I (High Sensitivity): 5 ng/L (ref <18)

## 2022-02-02 LAB — PREGNANCY, URINE: Preg Test, Ur: NEGATIVE

## 2022-02-02 LAB — POTASSIUM: Potassium: 4.5 mmol/L (ref 3.5–5.1)

## 2022-02-02 LAB — PROTIME-INR
INR: 0.9 (ref 0.8–1.2)
Prothrombin Time: 12.5 seconds (ref 11.4–15.2)

## 2022-02-02 LAB — BASIC METABOLIC PANEL
Anion gap: 9 (ref 5–15)
BUN: 40 mg/dL — ABNORMAL HIGH (ref 6–20)
CO2: 21 mmol/L — ABNORMAL LOW (ref 22–32)
Calcium: 9.3 mg/dL (ref 8.9–10.3)
Chloride: 105 mmol/L (ref 98–111)
Creatinine, Ser: 2.55 mg/dL — ABNORMAL HIGH (ref 0.44–1.00)
GFR, Estimated: 24 mL/min — ABNORMAL LOW (ref >60)
Glucose, Bld: 174 mg/dL — ABNORMAL HIGH (ref 70–99)
Potassium: 5.5 mmol/L — ABNORMAL HIGH (ref 3.5–5.1)
Sodium: 135 mmol/L (ref 135–145)

## 2022-02-02 LAB — BRAIN NATRIURETIC PEPTIDE: B Natriuretic Peptide: 65.8 pg/mL (ref 0.0–100.0)

## 2022-02-02 LAB — CBG MONITORING, ED
Glucose-Capillary: 187 mg/dL — ABNORMAL HIGH (ref 70–99)
Glucose-Capillary: 83 mg/dL (ref 70–99)

## 2022-02-02 LAB — D-DIMER, QUANTITATIVE: D-Dimer, Quant: 0.77 ug{FEU}/mL — ABNORMAL HIGH (ref 0.00–0.50)

## 2022-02-02 MED ORDER — TIZANIDINE HCL 4 MG PO TABS
4.0000 mg | ORAL_TABLET | Freq: Three times a day (TID) | ORAL | Status: DC | PRN
Start: 2022-02-02 — End: 2022-02-05
  Administered 2022-02-03 – 2022-02-05 (×7): 4 mg via ORAL
  Filled 2022-02-02 (×8): qty 1

## 2022-02-02 MED ORDER — OXYCODONE-ACETAMINOPHEN 5-325 MG PO TABS
2.0000 | ORAL_TABLET | Freq: Once | ORAL | Status: AC
  Administered 2022-02-02: 2 via ORAL
  Filled 2022-02-02: qty 2

## 2022-02-02 MED ORDER — BUSPIRONE HCL 10 MG PO TABS
10.0000 mg | ORAL_TABLET | Freq: Three times a day (TID) | ORAL | Status: DC
  Administered 2022-02-03 – 2022-02-05 (×7): 10 mg via ORAL
  Filled 2022-02-02 (×7): qty 1

## 2022-02-02 MED ORDER — SODIUM CHLORIDE 0.9 % IV SOLN: 250.0000 mL | INTRAVENOUS | Status: DC | PRN

## 2022-02-02 MED ORDER — INSULIN ASPART 100 UNIT/ML IJ SOLN
0.0000 [IU] | INTRAMUSCULAR | Status: DC
  Administered 2022-02-03 (×3): 2 [IU] via SUBCUTANEOUS
  Administered 2022-02-03: 1 [IU] via SUBCUTANEOUS
  Administered 2022-02-04: 5 [IU] via SUBCUTANEOUS
  Administered 2022-02-04 (×2): 3 [IU] via SUBCUTANEOUS
  Administered 2022-02-04: 2 [IU] via SUBCUTANEOUS
  Administered 2022-02-05: 5 [IU] via SUBCUTANEOUS

## 2022-02-02 MED ORDER — ACETAMINOPHEN 325 MG PO TABS
650.0000 mg | ORAL_TABLET | Freq: Four times a day (QID) | ORAL | Status: DC | PRN
  Filled 2022-02-02: qty 2

## 2022-02-02 MED ORDER — ESCITALOPRAM OXALATE 20 MG PO TABS
20.0000 mg | ORAL_TABLET | Freq: Every day | ORAL | Status: DC
  Administered 2022-02-03 – 2022-02-05 (×3): 20 mg via ORAL
  Filled 2022-02-02 (×3): qty 1

## 2022-02-02 MED ORDER — INSULIN GLARGINE-YFGN 100 UNIT/ML ~~LOC~~ SOLN
30.0000 [IU] | Freq: Every day | SUBCUTANEOUS | Status: DC
  Administered 2022-02-03 – 2022-02-04 (×2): 30 [IU] via SUBCUTANEOUS
  Filled 2022-02-02 (×5): qty 0.3

## 2022-02-02 MED ORDER — AMITRIPTYLINE HCL 25 MG PO TABS
25.0000 mg | ORAL_TABLET | Freq: Every evening | ORAL | Status: DC | PRN
Start: 2022-02-02 — End: 2022-02-05
  Administered 2022-02-03 – 2022-02-04 (×2): 25 mg via ORAL
  Filled 2022-02-02 (×3): qty 1

## 2022-02-02 MED ORDER — FENTANYL CITRATE PF 50 MCG/ML IJ SOSY: 75.0000 ug | PREFILLED_SYRINGE | INTRAMUSCULAR | Status: DC | PRN

## 2022-02-02 MED ORDER — LACTATED RINGERS IV BOLUS: 1000.0000 mL | Freq: Once | INTRAVENOUS | Status: DC

## 2022-02-02 MED ORDER — SODIUM CHLORIDE 0.9% FLUSH: 3.0000 mL | INTRAVENOUS | Status: DC | PRN

## 2022-02-02 MED ORDER — OXYCODONE HCL 5 MG PO TABS
10.0000 mg | ORAL_TABLET | Freq: Four times a day (QID) | ORAL | Status: DC | PRN
  Administered 2022-02-03 – 2022-02-05 (×8): 10 mg via ORAL
  Filled 2022-02-02 (×8): qty 2

## 2022-02-02 MED ORDER — SODIUM CHLORIDE 0.9 % IV BOLUS
1000.0000 mL | Freq: Once | INTRAVENOUS | Status: AC
  Administered 2022-02-02: 1000 mL via INTRAVENOUS

## 2022-02-02 MED ORDER — ACETAMINOPHEN 650 MG RE SUPP: 650.0000 mg | Freq: Four times a day (QID) | RECTAL | Status: DC | PRN

## 2022-02-02 MED ORDER — SODIUM CHLORIDE 0.9% FLUSH
3.0000 mL | Freq: Two times a day (BID) | INTRAVENOUS | Status: DC
  Administered 2022-02-03 – 2022-02-04 (×5): 3 mL via INTRAVENOUS

## 2022-02-02 MED ORDER — NORETHINDRONE 0.35 MG PO TABS: 1.0000 | ORAL_TABLET | Freq: Every day | ORAL | Status: DC

## 2022-02-02 NOTE — Assessment & Plan Note (Signed)
Given recent syncope allow permissive hypertension for tonight

## 2022-02-02 NOTE — H&P (Signed)
Briana Williams EEF:[id number] DOB: 05/24/1981 DOA: 02/02/2022      PCP: Ladell Pier, MD   Outpatient Specialists: * NONE CARDS: * Dr. NEphrology: *  Dr. NEurology *   Dr. Pulmonary *  Dr.  Oncology * Dr. Fabienne Bruns* Dr.  Sadie Haber, [facility]) No care team member to display Urology Dr. Marland [facility] Patient arrived to ER on 02/02/22 at 66 Referred by Attending Toy Baker, MD   Patient coming from:    home Lives With family    Chief Complaint:   Chief Complaint  Patient presents with   Loss of Consciousness    HPI: Briana Williams is a 41 y.o. female with medical history significant of CKD, DM2, hypertension, HLD,    Presented with syncope x2 Patient has been having syncopal episodes x2 today first was around 830 woke up suddenly on the ground with epistaxis second episode was around 1 PM when she was walking her dog again was witnessed by family she was noted to be, well believed by family. At some point patient was endorsing left arm and leg heaviness. Has had at least 2 falls today struck Williams on the left side of also reports pain in the neck and back daily numbness and tingling was mainly to the forearm and 3 fingers Patient has history of chronic neck back pain. She has poorly controlled hypertension and diabetes and CKD chronic anemia No associated bowel bladder incontinence no seizure No postictal state No prodromal state prior to this   No prior hx of syncope Left side numbness for the past 4 year but feel worse today  No  CP  On oxycodone 10 mg 5 times a day Also Neurontin Have not had anything today No etoh no tobacco Mother hx of CAD at age 82 Father with CVA Reports bilateral leg swelling for 3 wks Occasional sharp chest pain with inspiration      Regarding pertinent Chronic problems:     Hyperlipidemia -  on statins Lipitor (atorvastatin)  Lipid Panel     Component Value Date/Time   CHOL 198 08/11/2020 1632   TRIG 112  08/11/2020 1632   HDL 62 08/11/2020 1632   CHOLHDL 3.2 08/11/2020 1632   CHOLHDL 5.3 (H) 09/21/2015 1740   VLDL 56 (H) 09/21/2015 1740   LDLCALC 116 (H) 08/11/2020 1632   LABVLDL 20 08/11/2020 1632    HTN on coreg, hydralazine    DM 2 -  Lab Results  Component Value Date   HGBA1C 8.9 (A) 06/27/2021   on insulin,      Morbid obesity-   BMI Readings from Last 1 Encounters:  02/02/22 51.81 kg/m     CKD stage IV- baseline Cr 2.8 Estimated Creatinine Clearance: 28.6 mL/min (A) (by C-G formula based on SCr of 2.55 mg/dL (H)).  Lab Results  Component Value Date   CREATININE 2.55 (H) 02/02/2022   CREATININE 2.82 (H) 12/21/2021   CREATININE 3.38 (H) 12/20/2021     Chronic anemia - baseline hg Hemoglobin & Hematocrit  Recent Labs    12/19/21 0656 12/20/21 0322 02/02/22 1339  HGB 9.5* 10.0* 9.7*     While in ER: Clinical Course as of 02/02/22 2243  Sat Feb 02, 2022  1407 ~8/8:30 this AM and passed out hit hte ground.  Approximately 1 minute. No B/bladder incontinence. Woke up after but no postictal sx.   Second time was 12:30/1pm ish and again no symptoms ahead of time.   No CP or  SOB.   Neck pain, headache after Williams injury.  [WF]  1602 CT Cspine/Williams  1. No evidence of acute intracranial abnormality. 2. Remote dens fracture without change in alignment. 3. No evidence of acute fracture or traumatic malalignment. 4. Posterior fusion from C2-C3 and anterior fusion from C3-C6. 5. Extensive bony fusion throughout the cervical and upper thoracic spine.   Electronically Signed   By: Margaretha Sheffield M.D.   On: 02/02/2022 15:22   [WF]  1602 L shoulder xray unremarkable [WF]  7209 CT T spine unremarkable  IMPRESSION: 1. No acute osseous abnormality. 2. Solid osseous fusion of the thoracic spine. 3. Punctate bilateral nephrolithiasis.   Electronically Signed   By: Titus Dubin M.D.   On: 02/02/2022 16:27   [WF]    Clinical Course User Index [WF]  Tedd Sias, PA      Ordered  CT Williams   NON acute  CXR - ***NON acute    CTA chest - ***nonacute, no PE, * no evidence of infiltrate  Following Medications were ordered in ER: Medications  sodium chloride 0.9 % bolus 1,000 mL (0 mLs Intravenous Stopped 02/02/22 1828)  oxyCODONE-acetaminophen (PERCOCET/ROXICET) 5-325 MG per tablet 2 tablet (2 tablets Oral Given 02/02/22 1828)       ED Triage Vitals  Enc Vitals Group     BP 02/02/22 1309 (!) 154/87     Pulse Rate 02/02/22 1309 67     Resp 02/02/22 1309 17     Temp 02/02/22 2235 98 F (36.7 C)     Temp Source 02/02/22 2235 Oral     SpO2 02/02/22 1309 100 %     Weight 02/02/22 1309 227 lb (103 kg)     Height 02/02/22 1309 4' 7.5" (1.41 m)     Williams Circumference --      Peak Flow --      Pain Score 02/02/22 1309 9     Pain Loc --      Pain Edu? --      Excl. in [place]? --   TMAX(24)@     _________________________________________ Significant initial  Findings: Abnormal Labs Reviewed  BASIC METABOLIC PANEL - Abnormal; Notable for the following components:      Result Value   Potassium 5.5 (*)    CO2 21 (*)    Glucose, Bld 174 (*)    BUN 40 (*)    Creatinine, Ser 2.55 (*)    GFR, Estimated 24 (*)    All other components within normal limits  CBC - Abnormal; Notable for the following components:   RBC 3.24 (*)    Hemoglobin 9.7 (*)    HCT 30.1 (*)    Platelets 145 (*)    All other components within normal limits  URINALYSIS, ROUTINE W REFLEX MICROSCOPIC - Abnormal; Notable for the following components:   Glucose, UA 250 (*)    Hgb urine dipstick SMALL (*)    Protein, ur >300 (*)    All other components within normal limits  CBG MONITORING, ED - Abnormal; Notable for the following components:   Glucose-Capillary 187 (*)    All other components within normal limits  _________________________ Troponin 5 - 5 ECG: Ordered Personally reviewed and interpreted by me showing: HR : 67 Rhythm: Sinus rhythm QTC 441      The recent clinical data is shown below. Vitals:   02/02/22 2030 02/02/22 2100 02/02/22 2140 02/02/22 2235  BP: (!) 175/82 (!) 179/81 (!) 164/72 (!) 193/90  Pulse: 61  71 70 74  Resp: 17 18 16 18   Temp:    98 F (36.7 C)  TempSrc:    Oral  SpO2: 91% 99% 100% 100%  Weight:      Height:         WBC     Component Value Date/Time   WBC 7.1 02/02/2022 1339   LYMPHSABS 2.5 12/17/2021 1844   LYMPHSABS 2.5 09/15/2018 1702   MONOABS 0.6 12/17/2021 1844   EOSABS 0.2 12/17/2021 1844   EOSABS 0.2 09/15/2018 1702   BASOSABS 0.1 12/17/2021 1844   BASOSABS 0.0 09/15/2018 1702     Lactic Acid, Venous    Component Value Date/Time   LATICACIDVEN 1.1 12/19/2021 1140      UA   no evidence of UTI     Urine analysis:    Component Value Date/Time   COLORURINE YELLOW 02/02/2022 1559   APPEARANCEUR CLEAR 02/02/2022 1559   LABSPEC 1.012 02/02/2022 1559   PHURINE 6.5 02/02/2022 1559   GLUCOSEU 250 (A) 02/02/2022 1559   HGBUR SMALL (A) 02/02/2022 1559   HGBUR negative 10/04/2010 0947   BILIRUBINUR NEGATIVE 02/02/2022 1559   BILIRUBINUR small (A) 03/11/2019 1401   KETONESUR NEGATIVE 02/02/2022 1559   PROTEINUR >300 (A) 02/02/2022 1559   UROBILINOGEN 0.2 03/11/2019 1401   UROBILINOGEN 0.2 01/12/2011 2009   NITRITE NEGATIVE 02/02/2022 1559   LEUKOCYTESUR NEGATIVE 02/02/2022 1559    Results for orders placed or performed in visit on 03/16/20  Novel Coronavirus, NAA ([facility])     Status: Abnormal   Collection Time: 03/16/20 10:27 AM   Specimen: Nasopharyngeal(NP) swabs in vial transport medium   Nasopharynge  Screenin  Result Value Ref Range Status   SARS-CoV-2, NAA Detected (A) Not Detected Final    Comment: Patients who have a positive COVID-19 test result may now have treatment options. Treatment options are available for patients with mild to moderate symptoms and for hospitalized patients. Visit our website at [email or web address] for resources and information. This  nucleic acid amplification test was developed and its performance characteristics determined by [employer]. Nucleic acid amplification tests include RT-PCR and TMA. This test has not been FDA cleared or approved. This test has been authorized by FDA under an Emergency Use Authorization (EUA). This test is only authorized for the duration of time the declaration that circumstances exist justifying the authorization of the emergency use of in vitro diagnostic tests for detection of SARS-CoV-2 virus and/or diagnosis of COVID-19 infection under section 564(b)(1) of the Act, 21 U.S.C. 644IHK-7(Q) (1), unless the authorization is terminated or revoked sooner. When diagnostic testing is negativ e, the possibility of a false negative result should be considered in the context of a patient's recent exposures and the presence of clinical signs and symptoms consistent with COVID-19. An individual without symptoms of COVID-19 and who is not shedding SARS-CoV-2 virus would expect to have a negative (not detected) result in this assay.   SARS-COV-2, NAA 2 DAY TAT     Status: None   Collection Time: 03/16/20 10:27 AM   Nasopharynge  Screenin  Result Value Ref Range Status   SARS-CoV-2, NAA 2 DAY TAT Performed  Final     _______________________________________________ Hospitalist was called for admission for   Syncope,    The following Work up has been ordered so far:  Orders Placed This Encounter  Procedures   CT Williams Wo Contrast   CT Cervical Spine Wo Contrast   DG Shoulder Left   CT Thoracic Spine Wo Contrast  Basic metabolic panel   CBC   Urinalysis, Routine w reflex microscopic   Pregnancy, urine   Potassium   Brain natriuretic peptide   Comprehensive metabolic panel   CBC with Differential/Platelet   Protime-INR   CK   Magnesium   Phosphorus   Vitamin B12   Folate   Iron and TIBC   Ferritin   Reticulocytes   Occult blood card to lab, stool RN will collect    Document Height and Actual Weight   Cardiac monitoring   Consult to hospitalist   CBG monitoring, ED   CBG monitoring, ED   ED EKG   EKG 12-Lead   Type and screen [place] in observation (patient's expected length of stay will be less than 2 midnights)     OTHER Significant initial  Findings:  labs showing:    Recent Labs  Lab 02/02/22 1339 02/02/22 1558  NA 135  --   K 5.5* 4.5  CO2 21*  --   GLUCOSE 174*  --   BUN 40*  --   CREATININE 2.55*  --   CALCIUM 9.3  --     Cr    stable,   Lab Results  Component Value Date   CREATININE 2.55 (H) 02/02/2022   CREATININE 2.82 (H) 12/21/2021   CREATININE 3.38 (H) 12/20/2021    No results for input(s): "AST", "ALT", "ALKPHOS", "BILITOT", "PROT", "ALBUMIN" in the last 168 hours. Lab Results  Component Value Date   CALCIUM 9.3 02/02/2022          Plt: Lab Results  Component Value Date   PLT 145 (L) 02/02/2022       COVID-19 Labs  No results for input(s): "DDIMER", "FERRITIN", "LDH", "CRP" in the last 72 hours.  Lab Results  Component Value Date   SARSCOV2NAA Detected (A) 03/16/2020     Arterial ***Venous  Blood Gas result:  pH *** pCO2 ***; pO2 ***;     %O2 Sat ***.  ABG    Component Value Date/Time   HCO3 24.9 10/03/2018 0131   TCO2 26 10/03/2018 0131   ACIDBASEDEF 1.0 10/03/2018 0131   O2SAT 61.0 10/03/2018 0131         Recent Labs  Lab 02/02/22 1339  WBC 7.1  HGB 9.7*  HCT 30.1*  MCV 92.9  PLT 145*    HG/HCT  stable,       Component Value Date/Time   HGB 9.7 (L) 02/02/2022 1339   HGB 9.6 (L) 07/19/2021 1337   HGB 8.8 (L) 07/04/2021 1456   HCT 30.1 (L) 02/02/2022 1339   HCT 25.7 (L) 07/04/2021 1456   MCV 92.9 02/02/2022 1339   MCV 82 07/04/2021 1456      No results for input(s): "LIPASE", "AMYLASE" in the last 168 hours. No results for input(s): "AMMONIA" in the last 168 hours.    Cardiac Panel (last 3 results) No results for input(s):  "CKTOTAL", "CKMB", "TROPONINI", "RELINDX" in the last 72 hours.  .car BNP (last 3 results) Recent Labs    02/02/22 2014  BNP 65.8   DM  labs:  HbA1C: Recent Labs    03/22/21 1407 06/27/21 1111  HGBA1C 9.2* 8.9*       CBG (last 3)  Recent Labs    02/02/22 1309 02/02/22 2140  GLUCAP 187* 83          Cultures:    Component Value Date/Time   SDES BLOOD RIGHT HAND 10/03/2018 0137   SPECREQUEST  10/03/2018 0137    BOTTLES DRAWN AEROBIC AND ANAEROBIC Blood Culture results may not be optimal due to an excessive volume of blood received in culture bottles   CULT  10/03/2018 0137    NO GROWTH 5 DAYS Performed at [facility], [place] [address] [place], [place] [address]    REPTSTATUS 10/08/2018 FINAL 10/03/2018 9629     Radiological Exams on Admission: CT Thoracic Spine Wo Contrast  Result Date: 02/02/2022 CLINICAL DATA:  Left arm and left leg heaviness after 2 syncopal episodes earlier today. History of cervical fusion. EXAM: CT THORACIC SPINE WITHOUT CONTRAST TECHNIQUE: Multidetector CT images of the thoracic were obtained using the standard protocol without intravenous contrast. RADIATION DOSE REDUCTION: This exam was performed according to the departmental dose-optimization program which includes automated exposure control, adjustment of the mA and/or kV according to patient size and/or use of iterative reconstruction technique. COMPARISON:  Thoracic spine x-rays dated Dec 09, 2020. MRI thoracic spine dated January 04, 2019. FINDINGS: Alignment: Unchanged dextroscoliosis of the midthoracic spine and levoscoliosis of the upper lumbar spine. No listhesis. Vertebrae: No acute fracture or focal pathologic process. Paraspinal and other soft tissues: Punctate bilateral renal calculi. Prior cholecystectomy. Disc levels: Prior posterior cervicothoracic fusion to T3. Diffuse solid osseous fusion of the thoracic vertebral bodies and posterior elements. Peritoneal right remnant again seen  on the right at the thoracolumbar junction. IMPRESSION: 1. No acute osseous abnormality. 2. Solid osseous fusion of the thoracic spine. 3. Punctate bilateral nephrolithiasis. Electronically Signed   By: Titus Dubin M.D.   On: 02/02/2022 16:27   DG Shoulder Left  Result Date: 02/02/2022 CLINICAL DATA:  Left shoulder pain EXAM: LEFT SHOULDER - 2+ VIEW COMPARISON:  None Available. FINDINGS: There is no evidence of fracture or dislocation. There is no evidence of arthropathy or other focal bone abnormality. Partially imaged cervicothoracic fusion hardware. Soft tissues are unremarkable. IMPRESSION: No fracture or dislocation of the left shoulder. Joint spaces are preserved. Electronically Signed   By: Delanna Ahmadi M.D.   On: 02/02/2022 15:39   CT Williams Wo Contrast  Result Date: 02/02/2022 CLINICAL DATA:  Neck trauma, midline tenderness (Age 91-64y); Williams trauma, moderate-severe EXAM: CT Williams WITHOUT CONTRAST CT CERVICAL SPINE WITHOUT CONTRAST TECHNIQUE: Multidetector CT imaging of the Williams and cervical spine was performed following the standard protocol without intravenous contrast. Multiplanar CT image reconstructions of the cervical spine were also generated. RADIATION DOSE REDUCTION: This exam was performed according to the departmental dose-optimization program which includes automated exposure control, adjustment of the mA and/or kV according to patient size and/or use of iterative reconstruction technique. COMPARISON:  CT cervical spine 04/27/2019. FINDINGS: CT Williams FINDINGS Brain: No evidence of acute infarction, hemorrhage, hydrocephalus, extra-axial collection or mass lesion/mass effect. Vascular: No hyperdense vessel identified. Skull: No acute fracture. Sinuses/Orbits: Clear visualized sinuses. No acute orbital findings. Other: No mastoid effusions. CT CERVICAL SPINE FINDINGS Alignment: No substantial sagittal subluxation. Levocurvature of the lower cervical spine. Skull base and vertebrae:  Posterior fusion from C2-C3 and anterior fusion from C3-C6. Extensive bony fusion throughout the cervical and upper thoracic spine. Streak artifact from hardware limits assessment. No evidence of acute fracture. Remote/healed dens fracture. Soft tissues and spinal canal: No prevertebral fluid or swelling. No visible canal hematoma. Disc levels: Multilevel degenerative change including facet arthropathy and endplate spurring. Varying degrees of neural foraminal stenosis. Extensive posterior decompression. Upper chest: Visualized lung apices are clear. IMPRESSION: 1. No evidence of acute intracranial abnormality. 2. Remote dens fracture without  change in alignment. 3. No evidence of acute fracture or traumatic malalignment. 4. Posterior fusion from C2-C3 and anterior fusion from C3-C6. 5. Extensive bony fusion throughout the cervical and upper thoracic spine. Electronically Signed   By: Margaretha Sheffield M.D.   On: 02/02/2022 15:22   CT Cervical Spine Wo Contrast  Result Date: 02/02/2022 CLINICAL DATA:  Neck trauma, midline tenderness (Age 65-64y); Williams trauma, moderate-severe EXAM: CT Williams WITHOUT CONTRAST CT CERVICAL SPINE WITHOUT CONTRAST TECHNIQUE: Multidetector CT imaging of the Williams and cervical spine was performed following the standard protocol without intravenous contrast. Multiplanar CT image reconstructions of the cervical spine were also generated. RADIATION DOSE REDUCTION: This exam was performed according to the departmental dose-optimization program which includes automated exposure control, adjustment of the mA and/or kV according to patient size and/or use of iterative reconstruction technique. COMPARISON:  CT cervical spine 04/27/2019. FINDINGS: CT Williams FINDINGS Brain: No evidence of acute infarction, hemorrhage, hydrocephalus, extra-axial collection or mass lesion/mass effect. Vascular: No hyperdense vessel identified. Skull: No acute fracture. Sinuses/Orbits: Clear visualized sinuses. No acute  orbital findings. Other: No mastoid effusions. CT CERVICAL SPINE FINDINGS Alignment: No substantial sagittal subluxation. Levocurvature of the lower cervical spine. Skull base and vertebrae: Posterior fusion from C2-C3 and anterior fusion from C3-C6. Extensive bony fusion throughout the cervical and upper thoracic spine. Streak artifact from hardware limits assessment. No evidence of acute fracture. Remote/healed dens fracture. Soft tissues and spinal canal: No prevertebral fluid or swelling. No visible canal hematoma. Disc levels: Multilevel degenerative change including facet arthropathy and endplate spurring. Varying degrees of neural foraminal stenosis. Extensive posterior decompression. Upper chest: Visualized lung apices are clear. IMPRESSION: 1. No evidence of acute intracranial abnormality. 2. Remote dens fracture without change in alignment. 3. No evidence of acute fracture or traumatic malalignment. 4. Posterior fusion from C2-C3 and anterior fusion from C3-C6. 5. Extensive bony fusion throughout the cervical and upper thoracic spine. Electronically Signed   By: Margaretha Sheffield M.D.   On: 02/02/2022 15:22   _______________________________________________________________________________________________________ Latest  Blood pressure (!) 193/90, pulse 74, temperature 98 F (36.7 C), temperature source Oral, resp. rate 18, height 4' 7.5" (1.41 m), weight 103 kg, last menstrual period 01/08/2022, SpO2 100 %.   Vitals  labs and radiology finding personally reviewed  Review of Systems:    Pertinent positives include: ***  Constitutional:  No weight loss, night sweats, Fevers, chills, fatigue, weight loss  HEENT:  No headaches, Difficulty swallowing,Tooth/dental problems,Sore throat,  No sneezing, itching, ear ache, nasal congestion, post nasal drip,  Cardio-vascular:  No chest pain, Orthopnea, PND, anasarca, dizziness, palpitations.no Bilateral lower extremity swelling  GI:  No heartburn,  indigestion, abdominal pain, nausea, vomiting, diarrhea, change in bowel habits, loss of appetite, melena, blood in stool, hematemesis Resp:  no shortness of breath at rest. No dyspnea on exertion, No excess mucus, no productive cough, No non-productive cough, No coughing up of blood.No change in color of mucus.No wheezing. Skin:  no rash or lesions. No jaundice GU:  no dysuria, change in color of urine, no urgency or frequency. No straining to urinate.  No flank pain.  Musculoskeletal:  No joint pain or no joint swelling. No decreased range of motion. No back pain.  Psych:  No change in mood or affect. No depression or anxiety. No memory loss.  Neuro: no localizing neurological complaints, no tingling, no weakness, no double vision, no gait abnormality, no slurred speech, no confusion  All systems reviewed and apart from HOPI all are negative  _______________________________________________________________________________________________ Past Medical History:   Past Medical History:  Diagnosis Date   Arthritis    GERD (gastroesophageal reflux disease)    Headache    migraine 2 weeks ago   Hyperlipidemia    Hypertension associated with diabetes ([place])    Renal disorder    has both kidneys, compacted and 'squashed together"   Scoliosis    Sleep apnea    in the midst of being tested for it.   waiting on scheduling    Type 2 diabetes mellitus treated with insulin ([place])    dx 2000     Past Surgical History:  Procedure Laterality Date   BACK SURGERY     CERVICAL FUSION     CHOLECYSTECTOMY N/A 12/20/2021   Procedure: LAPAROSCOPIC CHOLECYSTECTOMY;  Surgeon: Clovis Riley, MD;  Location: [facility];  Service: General;  Laterality: N/A;   frontal closed sutures to forehead     Harrington Rod placement     HIP SURGERY     staph infection   [place] N/A 02/25/2017   Procedure: RADIOLOGY WITH ANESTHESIA/MRI THORACIC SPINE AND LEFT SHOULDER WITHOUT  CONTRAST.;  Surgeon: Radiologist, Medication, MD;  Location: [facility];  Service: Radiology;  Laterality: N/A;    Social History:  Ambulatory  independently       reports that she has never smoked. She has never used smokeless tobacco. She reports that she does not drink alcohol and does not use drugs.   Family History:   Family History  Problem Relation Age of Onset   Cancer Maternal Grandmother        mandibular cancer    Diabetes Mother    Heart failure Mother    CAD Mother        triple CABG   Diabetes Maternal Aunt    Stroke Father    ______________________________________________________________________________________________ Allergies: Allergies  Allergen Reactions   Hydrocodone Itching    Note: benadryl does NOT help   Lisinopril Nausea And Vomiting    Caused nausea   Metformin And Related Other (See Comments)    Causes blisters on skin   Norvasc [Amlodipine Besylate] Swelling    Ankle swelling   Other Other (See Comments)    Paper Tape causes Blisters   Ultrasound Gel Rash and Other (See Comments)    Patient gets a red rash with use of ultrasound gel/ burns skin     Prior to Admission medications   Medication Sig Start Date End Date Taking? Authorizing Provider  ACCU-CHEK SOFTCLIX LANCETS lancets 1 each by Other route 3 (three) times daily. ICD 10 E11.9 09/21/15   [name], MD  Accu-Chek Softclix Lancets lancets Use as instructed 01/02/21   Ladell Pier, MD  acetaminophen (8 HOUR PAIN RELIEVER) 650 MG CR tablet 1 (one) tablet by mouth three times daily, as needed Patient not taking: Reported on 01/08/2022 12/27/21     acetaminophen (8 HOUR PAIN RELIEVER) 650 MG CR tablet 1 (one) tablet by mouth three times daily, as needed 01/28/22     amitriptyline (ELAVIL) 25 MG tablet TAKE 1 TABLET(25 MG) BY MOUTH AT BEDTIME 10/29/21   Ladell Pier, MD  atorvastatin (LIPITOR) 10 MG tablet Take 1 tablet (10 mg total) by mouth daily. Patient not taking: Reported on  01/08/2022 09/15/18   Ladell Pier, MD  Blood Glucose Monitoring Suppl (ACCU-CHEK AVIVA PLUS) w/Device KIT 1 Device by Does not apply route 3 (three) times daily after meals. ICD 10  E11.9 Patient not taking: Reported on 01/08/2022 09/21/15   [name], MD  Blood Glucose Monitoring Suppl (ACCU-CHEK GUIDE) w/Device KIT Use as directed Patient not taking: Reported on 01/08/2022 01/02/21   Ladell Pier, MD  busPIRone (BUSPAR) 10 MG tablet TAKE 1 TABLET(10 MG) BY MOUTH THREE TIMES DAILY 01/09/22   Mayers, Cari S, PA-C  carvedilol (COREG) 12.5 MG tablet Take 1 tablet (12.5 mg total) by mouth 2 (two) times daily with a meal. 01/09/22   Ladell Pier, MD  escitalopram (LEXAPRO) 20 MG tablet TAKE 1 TABLET(20 MG) BY MOUTH DAILY 01/09/22   Mayers, Cari S, PA-C  ferrous sulfate 325 (65 FE) MG tablet Take 1 tablet (325 mg total) by mouth daily with breakfast. Patient not taking: Reported on 01/08/2022 07/05/21   Ladell Pier, MD  gabapentin (NEURONTIN) 400 MG capsule Take 1 capsule by mouth three times daily Patient not taking: Reported on 01/08/2022 12/27/21     glucose blood (ACCU-CHEK AVIVA PLUS) test strip 1 each by Other route 3 (three) times daily. ICD 10 E11.9 Patient not taking: Reported on 01/08/2022 09/21/15   [name], MD  glucose blood (ACCU-CHEK GUIDE) test strip Test 3x/day before meals Patient not taking: Reported on 01/08/2022 01/02/21   Ladell Pier, MD  hydrALAZINE (APRESOLINE) 10 MG tablet Take 1 tablet (10 mg total) by mouth 2 (two) times daily. 01/09/22   Ladell Pier, MD  hydrOXYzine (ATARAX) 25 MG tablet Take 1-2 tabs PO q6-8 hours for anxiety PRN 01/09/22   Mayers, Cari S, PA-C  insulin aspart (NOVOLOG FLEXPEN) 100 UNIT/ML FlexPen INJECT 15 UNITS INTO THE SKIN WITH THE TWO LARGEST MEALS OF THE DAY. 01/09/22   Ladell Pier, MD  insulin glargine (LANTUS SOLOSTAR) 100 UNIT/ML Solostar Pen Inject 38 Units into the skin daily. 01/09/22   Ladell Pier, MD   Insulin Pen Needle (B-D ULTRAFINE III SHORT PEN) 31G X 8 MM MISC 1 application by Does not apply route daily. Patient not taking: Reported on 01/08/2022 01/02/17   [name], MD  Lancet Devices [facility]) lancets 1 each by Other route 3 (three) times daily. ICD 10 E11.9 Patient not taking: Reported on 01/08/2022 09/21/15   [name], MD  naloxone [facility]) nasal spray 4 mg/0.1 mL Inhale 1 spray as needed. Patient not taking: Reported on 01/08/2022 12/24/21     norethindrone (MICRONOR) 0.35 MG tablet Take 1 tablet by mouth daily.    [provider]  ondansetron (ZOFRAN-ODT) 4 MG disintegrating tablet Take 1 tablet (4 mg total) by mouth every 8 (eight) hours as needed. 12/21/21   [name], MD  oxyCODONE (ROXICODONE) 5 MG immediate release tablet Take 1 tablet (5 mg total) by mouth every 6 (six) hours as needed for severe pain or breakthrough pain. Patient not taking: Reported on 01/08/2022 12/21/21   [name], PA-C  Oxycodone HCl 10 MG TABS Take 10 mg by mouth 5 (five) times daily. 06/10/21   [provider]  Oxycodone HCl 10 MG TABS 1 (one) Tablet 5 x a day as needed Patient not taking: Reported on 01/08/2022 12/27/21     Oxycodone HCl 10 MG TABS take one tablet by mouth five times daily 12/27/21     sitaGLIPtin (JANUVIA) 25 MG tablet Take 1 tablet (25 mg total) by mouth daily. 12/21/21 01/20/22  [name], MD  tiZANidine (ZANAFLEX) 4 MG tablet TAKE 1 TABLET BY MOUTH EVERY 8 HOURS AS NEEDED 02/21/20   Wynetta Emery,  Dalbert Batman, MD  tiZANidine (ZANAFLEX) 4 MG tablet Take 1 (one) Tablet by mouth four times daily, as needed Patient not taking: Reported on 01/08/2022 12/27/21     Vitamin D, Ergocalciferol, (DRISDOL) 1.25 MG (50000 UNIT) CAPS capsule Take 1 capsule by mouth once a week 12/27/21       ___________________________________________________________________________________________________ Physical Exam:    02/02/2022   10:35 PM 02/02/2022    9:40 PM  02/02/2022    9:00 PM  Vitals with BMI  Systolic 751 700 174  Diastolic 90 72 81  Pulse 74 70 71     1. General:  in No  Acute distress   Chronically ill   -appearing 2. Psychological: Alert and *** Oriented 3. Williams/ENT:   Moist *** Dry Mucous Membranes                          Williams Non traumatic, neck supple                          Normal *** Poor Dentition 4. SKIN: normal *** decreased Skin turgor,  Skin clean Dry and intact no rash 5. Heart: Regular rate and rhythm no*** Murmur, no Rub or gallop 6. Lungs: ***Clear to auscultation bilaterally, no wheezes or crackles   7. Abdomen: Soft, ***non-tender, Non distended *** obese ***bowel sounds present 8. Lower extremities: no clubbing, cyanosis, no ***edema 9. Neurologically Grossly intact, moving all 4 extremities equally *** strength 5 out of 5 in all 4 extremities cranial nerves II through XII intact 10. MSK: Normal range of motion    Chart has been reviewed  ______________________________________________________________________________________________  Assessment/Plan  ***  Admitted for Syncope, unspecified syncope type ***    Present on Admission:  Syncope     No problem-specific Assessment & Plan notes found for this encounter.  Other plan as per orders.  DVT prophylaxis:  SCD *** Lovenox     Code Status: DNR/DNI  as per patient  I had personally discussed CODE STATUS with patient   Family Communication:   Family not at  Bedside    Disposition Plan:   *** likely will need placement for rehabilitation                          Back to current facility when stable                            To home once workup is complete and patient is stable  ***Following barriers for discharge:                            Electrolytes corrected                               Anemia corrected                             Pain controlled with PO medications                               Afebrile, white count improving able  to transition to PO antibiotics  Will need to be able to tolerate PO                            Will likely need home health, home O2, set up                           Will need consultants to evaluate patient prior to discharge  ****EXPECT DC tomorrow                    ***Would benefit from PT/OT eval prior to DC  Ordered                   Swallow eval - SLP ordered                   Diabetes care coordinator                   Transition of care consulted                   Nutrition    consulted                  Wound care  consulted                   Palliative care    consulted                   Behavioral health  consulted                    Consults called: ***    Admission status:  ED Disposition     ED Disposition  [place]: [facility] [[id number]]  Level of Care: Telemetry [5]  Admit to tele based on following criteria: Eval of Syncope  Interfacility transfer: Yes  May place patient in observation at [facility] or [facility] if equivalent level of care is available:: Yes  Covid Evaluation: Asymptomatic - no recent exposure (last 10 days) testing not required  Diagnosis: Syncope [[id number]]  Admitting Physician: Shela Leff [[id number]]  Attending Physician: Varney Biles [phone]           Obs***  ***  inpatient     I Expect 2 midnight stay secondary to severity of patient's current illness need for inpatient interventions justified by the following: ***hemodynamic instability despite optimal treatment (tachycardia *hypotension * tachypnea *hypoxia, hypercapnia) * Severe lab/radiological/exam abnormalities including:     and extensive comorbidities including: *substance abuse  *Chronic pain *DM2  * CHF * CAD  * COPD/asthma *Morbid Obesity * CKD *dementia *liver disease *history of stroke with residual deficits *  malignancy, * sickle cell disease  History of  amputation Chronic anticoagulation  That are currently affecting medical management.   I expect  patient to be hospitalized for 2 midnights requiring inpatient medical care.  Patient is at high risk for adverse outcome (such as loss of life or disability) if not treated.  Indication for inpatient stay as follows:  Severe change from baseline regarding mental status Hemodynamic instability despite maximal medical therapy,  ongoing suicidal ideations,  severe pain requiring acute inpatient management,  inability to maintain oral hydration   persistent chest pain despite medical management Need for operative/procedural  intervention New or worsening hypoxia   Need for IV antibiotics,  IV fluids, IV rate controling medications, IV antihypertensives, IV pain medications, IV anticoagulation, need for biPAP    Level of care   *** tele  For 12H 24H     medical floor       progressive tele indefinitely please discontinue once patient no longer qualifies COVID-19 Labs    Lab Results  Component Value Date   SARSCOV2NAA Detected (A) 03/16/2020     Precautions: admitted as *** Covid Negative  ***asymptomatic screening protocol****PUI *** covid positive No active isolations ***If Covid PCR is negative  - please DC precautions - would need additional investigation given very high risk for false native test result    Critical***  Patient is critically ill due to  hemodynamic instability * respiratory failure *severe sepsis* ongoing chest pain*  They are at high risk for life/limb threatening clinical deterioration requiring frequent reassessment and modifications of care.  Services provided include examination of the patient, review of relevant ancillary tests, prescription of lifesaving therapies, review of medications and prophylactic therapy.  Total critical care time excluding separately billable procedures: 60*  Minutes.  Theresia Pree 02/02/2022, 10:43 PM ***  [facility]     after 2 AM please page floor coverage PA If 7AM-7PM, please contact the day team taking care of the patient using [email or web address]   Patient was evaluated in the context of the global COVID-19 pandemic, which necessitated consideration that the patient might be at risk for infection with the SARS-CoV-2 virus that causes COVID-19. Institutional protocols and algorithms that pertain to the evaluation of patients at risk for COVID-19 are in a state of rapid change based on information released by regulatory bodies including the CDC and federal and state organizations. These policies and algorithms were followed during the patient's care.

## 2022-02-02 NOTE — Assessment & Plan Note (Signed)
-  chronic avoid nephrotoxic medications such as NSAIDs, Vanco Zosyn combo,  avoid hypotension, continue to follow renal function  

## 2022-02-02 NOTE — Assessment & Plan Note (Signed)
order anemia panel to see if there is any correctable source

## 2022-02-02 NOTE — Assessment & Plan Note (Signed)
Order sliding scale Restart Lantus at a decreased dose of 30 units nightly Given BG <100 will hold tonight dose  Follow blood sugars appreciate diabetes coordinator consult

## 2022-02-02 NOTE — ED Provider Notes (Signed)
[facility] EMERGENCY DEPT Provider Note   CSN: [id number] Arrival date & time: 02/02/22  1259     History  Chief Complaint  Patient presents with   Loss of Consciousness    Briana Williams is a 41 y.o. female.   Loss of Consciousness  Patient is a 41 year old female morbidly obese, history of neck neck and back pain secondary to scoliosis and also old fractures with fusion, history of severe hypertension and has been admitted for hypertensive urgency/emergency in the past, sleep apnea, HLD, DM 2, renal disease/CKD, anemia   She is presented emergency room today with complaint of syncope.  This happened twice today.  She states she has not ever passed out before.  She states the first episode occurred around 8/830 this morning.  She states that she did not have any prodromal symptoms and woke up on the ground.  No bowel or bladder incontinence no tongue laceration.  This was observed by family member from [place] and there was no seizure-like activity.  She was not postictal after.  Her second episode was around 1230/1 PM and again had no prodromal symptoms.  She states as a result of the falls she has some neck pain, headache after head injury.  And feels some achiness in her left shoulder and generally somewhat more tired.  She informs me that her husband died 1 month ago and she has been somewhat stressed related to this.  However she has not had any syncope or near syncopal episodes during this time.  Until today.  She has never had an ultrasound of her heart done or any heart evaluation     Home Medications Prior to Admission medications   Medication Sig Start Date End Date Taking? Authorizing Provider  ACCU-CHEK SOFTCLIX LANCETS lancets 1 each by Other route 3 (three) times daily. ICD 10 E11.9 09/21/15   [name], MD  Accu-Chek Softclix Lancets lancets Use as instructed 01/02/21   Ladell Pier, MD  acetaminophen (8 HOUR PAIN RELIEVER) 650 MG  CR tablet 1 (one) tablet by mouth three times daily, as needed Patient not taking: Reported on 01/08/2022 12/27/21     acetaminophen (8 HOUR PAIN RELIEVER) 650 MG CR tablet 1 (one) tablet by mouth three times daily, as needed 01/28/22     amitriptyline (ELAVIL) 25 MG tablet TAKE 1 TABLET(25 MG) BY MOUTH AT BEDTIME 10/29/21   Ladell Pier, MD  atorvastatin (LIPITOR) 10 MG tablet Take 1 tablet (10 mg total) by mouth daily. Patient not taking: Reported on 01/08/2022 09/15/18   Ladell Pier, MD  Blood Glucose Monitoring Suppl (ACCU-CHEK AVIVA PLUS) w/Device KIT 1 Device by Does not apply route 3 (three) times daily after meals. ICD 10 E11.9 Patient not taking: Reported on 01/08/2022 09/21/15   [name], MD  Blood Glucose Monitoring Suppl (ACCU-CHEK GUIDE) w/Device KIT Use as directed Patient not taking: Reported on 01/08/2022 01/02/21   Ladell Pier, MD  busPIRone (BUSPAR) 10 MG tablet TAKE 1 TABLET(10 MG) BY MOUTH THREE TIMES DAILY 01/09/22   Mayers, Cari S, PA-C  carvedilol (COREG) 12.5 MG tablet Take 1 tablet (12.5 mg total) by mouth 2 (two) times daily with a meal. 01/09/22   Ladell Pier, MD  escitalopram (LEXAPRO) 20 MG tablet TAKE 1 TABLET(20 MG) BY MOUTH DAILY 01/09/22   Mayers, Cari S, PA-C  ferrous sulfate 325 (65 FE) MG tablet Take 1 tablet (325 mg total) by mouth daily with breakfast. Patient not taking: Reported on  01/08/2022 07/05/21   Ladell Pier, MD  gabapentin (NEURONTIN) 400 MG capsule Take 1 capsule by mouth three times daily Patient not taking: Reported on 01/08/2022 12/27/21     glucose blood (ACCU-CHEK AVIVA PLUS) test strip 1 each by Other route 3 (three) times daily. ICD 10 E11.9 Patient not taking: Reported on 01/08/2022 09/21/15   [name], MD  glucose blood (ACCU-CHEK GUIDE) test strip Test 3x/day before meals Patient not taking: Reported on 01/08/2022 01/02/21   Ladell Pier, MD  hydrALAZINE (APRESOLINE) 10 MG tablet Take 1 tablet (10 mg total)  by mouth 2 (two) times daily. 01/09/22   Ladell Pier, MD  hydrOXYzine (ATARAX) 25 MG tablet Take 1-2 tabs PO q6-8 hours for anxiety PRN 01/09/22   Mayers, Cari S, PA-C  insulin aspart (NOVOLOG FLEXPEN) 100 UNIT/ML FlexPen INJECT 15 UNITS INTO THE SKIN WITH THE TWO LARGEST MEALS OF THE DAY. 01/09/22   Ladell Pier, MD  insulin glargine (LANTUS SOLOSTAR) 100 UNIT/ML Solostar Pen Inject 38 Units into the skin daily. 01/09/22   Ladell Pier, MD  Insulin Pen Needle (B-D ULTRAFINE III SHORT PEN) 31G X 8 MM MISC 1 application by Does not apply route daily. Patient not taking: Reported on 01/08/2022 01/02/17   [name], MD  Lancet Devices [facility]) lancets 1 each by Other route 3 (three) times daily. ICD 10 E11.9 Patient not taking: Reported on 01/08/2022 09/21/15   [name], MD  naloxone [facility]) nasal spray 4 mg/0.1 mL Inhale 1 spray as needed. Patient not taking: Reported on 01/08/2022 12/24/21     norethindrone (MICRONOR) 0.35 MG tablet Take 1 tablet by mouth daily.    [provider]  ondansetron (ZOFRAN-ODT) 4 MG disintegrating tablet Take 1 tablet (4 mg total) by mouth every 8 (eight) hours as needed. 12/21/21   [name], MD  oxyCODONE (ROXICODONE) 5 MG immediate release tablet Take 1 tablet (5 mg total) by mouth every 6 (six) hours as needed for severe pain or breakthrough pain. Patient not taking: Reported on 01/08/2022 12/21/21   [name], PA-C  Oxycodone HCl 10 MG TABS Take 10 mg by mouth 5 (five) times daily. 06/10/21   [provider]  Oxycodone HCl 10 MG TABS 1 (one) Tablet 5 x a day as needed Patient not taking: Reported on 01/08/2022 12/27/21     Oxycodone HCl 10 MG TABS take one tablet by mouth five times daily 12/27/21     sitaGLIPtin (JANUVIA) 25 MG tablet Take 1 tablet (25 mg total) by mouth daily. 12/21/21 01/20/22  [name], MD  tiZANidine (ZANAFLEX) 4 MG tablet TAKE 1 TABLET BY MOUTH EVERY 8 HOURS AS NEEDED 02/21/20    Ladell Pier, MD  tiZANidine (ZANAFLEX) 4 MG tablet Take 1 (one) Tablet by mouth four times daily, as needed Patient not taking: Reported on 01/08/2022 12/27/21     Vitamin D, Ergocalciferol, (DRISDOL) 1.25 MG (50000 UNIT) CAPS capsule Take 1 capsule by mouth once a week 12/27/21         Allergies    Hydrocodone, Lisinopril, Metformin and related, Norvasc [amlodipine besylate], Other, and Ultrasound gel    Review of Systems   Review of Systems  Cardiovascular:  Positive for syncope.    Physical Exam Updated Vital Signs BP (!) 175/82   Pulse 61   Resp 17   Ht 4' 7.5" (1.41 m)   Wt 103 kg   LMP 01/08/2022 (Exact Date)   SpO2  91%   BMI 51.81 kg/m  Physical Exam Vitals and nursing note reviewed.  Constitutional:      General: She is not in acute distress.    Appearance: She is obese.     Comments: Chronically unwell appearing 41 year old female in no acute distress.  Pleasant, able to answer questions appropriately follow commands  HENT:     Head: Normocephalic.     Comments: Dried blood from epistaxis    Nose: Nose normal.     Mouth/Throat:     Mouth: Mucous membranes are moist.  Eyes:     General: No scleral icterus. Cardiovascular:     Rate and Rhythm: Normal rate and regular rhythm.     Pulses: Normal pulses.     Heart sounds: Normal heart sounds.  Pulmonary:     Effort: Pulmonary effort is normal. No respiratory distress.     Breath sounds: No wheezing.  Abdominal:     Palpations: Abdomen is soft.     Tenderness: There is no abdominal tenderness. There is no guarding or rebound.     Comments: obese  Musculoskeletal:     Cervical back: Normal range of motion.     Right lower leg: No edema.     Left lower leg: No edema.     Comments: Neck fixation Limited range of motion of neck.  No midline tenderness  Skin:    General: Skin is warm and dry.     Capillary Refill: Capillary refill takes less than 2 seconds.  Neurological:     Mental Status: She is alert.  Mental status is at baseline.     Comments: Moves all 4 extremities  Psychiatric:        Mood and Affect: Mood normal.        Behavior: Behavior normal.     ED Results / Procedures / Treatments   Labs (all labs ordered are listed, but only abnormal results are displayed) Labs Reviewed  BASIC METABOLIC PANEL - Abnormal; Notable for the following components:      Result Value   Potassium 5.5 (*)    CO2 21 (*)    Glucose, Bld 174 (*)    BUN 40 (*)    Creatinine, Ser 2.55 (*)    GFR, Estimated 24 (*)    All other components within normal limits  CBC - Abnormal; Notable for the following components:   RBC 3.24 (*)    Hemoglobin 9.7 (*)    HCT 30.1 (*)    Platelets 145 (*)    All other components within normal limits  URINALYSIS, ROUTINE W REFLEX MICROSCOPIC - Abnormal; Notable for the following components:   Glucose, UA 250 (*)    Hgb urine dipstick SMALL (*)    Protein, ur >300 (*)    All other components within normal limits  CBG MONITORING, ED - Abnormal; Notable for the following components:   Glucose-Capillary 187 (*)    All other components within normal limits  PREGNANCY, URINE  POTASSIUM  BRAIN NATRIURETIC PEPTIDE  CBG MONITORING, ED  TROPONIN I (HIGH SENSITIVITY)  TROPONIN I (HIGH SENSITIVITY)    EKG EKG Interpretation  Date/Time:  Saturday February 02 2022 13:16:02 EDT Ventricular Rate:  67 PR Interval:  184 QRS Duration: 95 QT Interval:  417 QTC Calculation: 441 R Axis:   80 Text Interpretation: Sinus rhythm Confirmed by Lennice Sites ([id number]) on 02/02/2022 1:19:35 PM  Radiology CT Thoracic Spine Wo Contrast  Result Date: 02/02/2022 CLINICAL DATA:  Left arm and  left leg heaviness after 2 syncopal episodes earlier today. History of cervical fusion. EXAM: CT THORACIC SPINE WITHOUT CONTRAST TECHNIQUE: Multidetector CT images of the thoracic were obtained using the standard protocol without intravenous contrast. RADIATION DOSE REDUCTION: This exam was performed  according to the departmental dose-optimization program which includes automated exposure control, adjustment of the mA and/or kV according to patient size and/or use of iterative reconstruction technique. COMPARISON:  Thoracic spine x-rays dated Dec 09, 2020. MRI thoracic spine dated January 04, 2019. FINDINGS: Alignment: Unchanged dextroscoliosis of the midthoracic spine and levoscoliosis of the upper lumbar spine. No listhesis. Vertebrae: No acute fracture or focal pathologic process. Paraspinal and other soft tissues: Punctate bilateral renal calculi. Prior cholecystectomy. Disc levels: Prior posterior cervicothoracic fusion to T3. Diffuse solid osseous fusion of the thoracic vertebral bodies and posterior elements. Peritoneal right remnant again seen on the right at the thoracolumbar junction. IMPRESSION: 1. No acute osseous abnormality. 2. Solid osseous fusion of the thoracic spine. 3. Punctate bilateral nephrolithiasis. Electronically Signed   By: Titus Dubin M.D.   On: 02/02/2022 16:27   DG Shoulder Left  Result Date: 02/02/2022 CLINICAL DATA:  Left shoulder pain EXAM: LEFT SHOULDER - 2+ VIEW COMPARISON:  None Available. FINDINGS: There is no evidence of fracture or dislocation. There is no evidence of arthropathy or other focal bone abnormality. Partially imaged cervicothoracic fusion hardware. Soft tissues are unremarkable. IMPRESSION: No fracture or dislocation of the left shoulder. Joint spaces are preserved. Electronically Signed   By: Delanna Ahmadi M.D.   On: 02/02/2022 15:39   CT Head Wo Contrast  Result Date: 02/02/2022 CLINICAL DATA:  Neck trauma, midline tenderness (Age 74-64y); Head trauma, moderate-severe EXAM: CT HEAD WITHOUT CONTRAST CT CERVICAL SPINE WITHOUT CONTRAST TECHNIQUE: Multidetector CT imaging of the head and cervical spine was performed following the standard protocol without intravenous contrast. Multiplanar CT image reconstructions of the cervical spine were also generated.  RADIATION DOSE REDUCTION: This exam was performed according to the departmental dose-optimization program which includes automated exposure control, adjustment of the mA and/or kV according to patient size and/or use of iterative reconstruction technique. COMPARISON:  CT cervical spine 04/27/2019. FINDINGS: CT HEAD FINDINGS Brain: No evidence of acute infarction, hemorrhage, hydrocephalus, extra-axial collection or mass lesion/mass effect. Vascular: No hyperdense vessel identified. Skull: No acute fracture. Sinuses/Orbits: Clear visualized sinuses. No acute orbital findings. Other: No mastoid effusions. CT CERVICAL SPINE FINDINGS Alignment: No substantial sagittal subluxation. Levocurvature of the lower cervical spine. Skull base and vertebrae: Posterior fusion from C2-C3 and anterior fusion from C3-C6. Extensive bony fusion throughout the cervical and upper thoracic spine. Streak artifact from hardware limits assessment. No evidence of acute fracture. Remote/healed dens fracture. Soft tissues and spinal canal: No prevertebral fluid or swelling. No visible canal hematoma. Disc levels: Multilevel degenerative change including facet arthropathy and endplate spurring. Varying degrees of neural foraminal stenosis. Extensive posterior decompression. Upper chest: Visualized lung apices are clear. IMPRESSION: 1. No evidence of acute intracranial abnormality. 2. Remote dens fracture without change in alignment. 3. No evidence of acute fracture or traumatic malalignment. 4. Posterior fusion from C2-C3 and anterior fusion from C3-C6. 5. Extensive bony fusion throughout the cervical and upper thoracic spine. Electronically Signed   By: Margaretha Sheffield M.D.   On: 02/02/2022 15:22   CT Cervical Spine Wo Contrast  Result Date: 02/02/2022 CLINICAL DATA:  Neck trauma, midline tenderness (Age 74-64y); Head trauma, moderate-severe EXAM: CT HEAD WITHOUT CONTRAST CT CERVICAL SPINE WITHOUT CONTRAST TECHNIQUE: Multidetector CT  imaging  of the head and cervical spine was performed following the standard protocol without intravenous contrast. Multiplanar CT image reconstructions of the cervical spine were also generated. RADIATION DOSE REDUCTION: This exam was performed according to the departmental dose-optimization program which includes automated exposure control, adjustment of the mA and/or kV according to patient size and/or use of iterative reconstruction technique. COMPARISON:  CT cervical spine 04/27/2019. FINDINGS: CT HEAD FINDINGS Brain: No evidence of acute infarction, hemorrhage, hydrocephalus, extra-axial collection or mass lesion/mass effect. Vascular: No hyperdense vessel identified. Skull: No acute fracture. Sinuses/Orbits: Clear visualized sinuses. No acute orbital findings. Other: No mastoid effusions. CT CERVICAL SPINE FINDINGS Alignment: No substantial sagittal subluxation. Levocurvature of the lower cervical spine. Skull base and vertebrae: Posterior fusion from C2-C3 and anterior fusion from C3-C6. Extensive bony fusion throughout the cervical and upper thoracic spine. Streak artifact from hardware limits assessment. No evidence of acute fracture. Remote/healed dens fracture. Soft tissues and spinal canal: No prevertebral fluid or swelling. No visible canal hematoma. Disc levels: Multilevel degenerative change including facet arthropathy and endplate spurring. Varying degrees of neural foraminal stenosis. Extensive posterior decompression. Upper chest: Visualized lung apices are clear. IMPRESSION: 1. No evidence of acute intracranial abnormality. 2. Remote dens fracture without change in alignment. 3. No evidence of acute fracture or traumatic malalignment. 4. Posterior fusion from C2-C3 and anterior fusion from C3-C6. 5. Extensive bony fusion throughout the cervical and upper thoracic spine. Electronically Signed   By: Margaretha Sheffield M.D.   On: 02/02/2022 15:22    Procedures Procedures    Medications Ordered  in ED Medications  sodium chloride 0.9 % bolus 1,000 mL (0 mLs Intravenous Stopped 02/02/22 1828)  oxyCODONE-acetaminophen (PERCOCET/ROXICET) 5-325 MG per tablet 2 tablet (2 tablets Oral Given 02/02/22 1828)    ED Course/ Medical Decision Making/ A&P Clinical Course as of 02/02/22 2045  Sat Feb 02, 2022  1407 ~8/8:30 this AM and passed out hit hte ground.  Approximately 1 minute. No B/bladder incontinence. Woke up after but no postictal sx.   Second time was 12:30/1pm ish and again no symptoms ahead of time.   No CP or SOB.   Neck pain, headache after head injury.  [WF]  1602 CT Cspine/head  1. No evidence of acute intracranial abnormality. 2. Remote dens fracture without change in alignment. 3. No evidence of acute fracture or traumatic malalignment. 4. Posterior fusion from C2-C3 and anterior fusion from C3-C6. 5. Extensive bony fusion throughout the cervical and upper thoracic spine.   Electronically Signed   By: Margaretha Sheffield M.D.   On: 02/02/2022 15:22   [WF]  1602 L shoulder xray unremarkable [WF]  7342 CT T spine unremarkable  IMPRESSION: 1. No acute osseous abnormality. 2. Solid osseous fusion of the thoracic spine. 3. Punctate bilateral nephrolithiasis.   Electronically Signed   By: Titus Dubin M.D.   On: 02/02/2022 16:27   [WF]    Clinical Course User Index [WF] Tedd Sias, PA                           Medical Decision Making Amount and/or Complexity of Data Reviewed Labs: ordered. Radiology: ordered.  Risk Prescription drug management.   This patient presents to the ED for concern of syncope, this involves a number of treatment options, and is a complaint that carries with it a high risk of complications and morbidity.  The differential diagnosis includes anemia, hypoglycemia, arrhythmia, AAA, thoracic aorta dissection, PE, tamponade,  ectopic pregnancy  In this particular situation given that this was painless syncope and patient is  neither hypoglycemic or anemic I have some concern for arrhythmia.  She has not had any cardiac work-up in the past.   Co morbidities: Discussed in HPI   Brief History:  Patient is a 41 year old female morbidly obese, history of neck neck and back pain secondary to scoliosis and also old fractures with fusion, history of severe hypertension and has been admitted for hypertensive urgency/emergency in the past, sleep apnea, HLD, DM 2, renal disease/CKD, anemia   She is presented emergency room today with complaint of syncope.  This happened twice today.  She states she has not ever passed out before.  She states the first episode occurred around 8/830 this morning.  She states that she did not have any prodromal symptoms and woke up on the ground.  No bowel or bladder incontinence no tongue laceration.  This was observed by family member from [place] and there was no seizure-like activity.  She was not postictal after.  Her second episode was around 1230/1 PM and again had no prodromal symptoms.  She states as a result of the falls she has some neck pain, headache after head injury.  And feels some achiness in her left shoulder and generally somewhat more tired.  She informs me that her husband died 1 month ago and she has been somewhat stressed related to this.  However she has not had any syncope or near syncopal episodes during this time.  Until today.  She has never had an ultrasound of her heart done or any heart evaluation    EMR reviewed including pt PMHx, past surgical history and past visits to ER.   See HPI for more details   Lab Tests:   I ordered and independently interpreted labs. Labs notable for BMP with hyperkalemia repeat potassium is 4.5.  Seems to be at baseline kidney dysfunction with a BUN elevated perhaps somewhat prerenal.  CBC with anemia urinalysis with glucosuria and protein in urine.  Troponin x2 within normal limits.  Pregnancy test negative.  BNP added  on.   Imaging Studies:  NAD. I personally reviewed all imaging studies and no acute abnormality found. I agree with radiology interpretation.  No acute disease found in CT head, CT C-spine, CT T-spine  Cardiac Monitoring:  The patient was maintained on a cardiac monitor.  I personally viewed and interpreted the cardiac monitored which showed an underlying rhythm of: EKG non-ischemic   Medicines ordered:  I ordered medication including Percocet, 1 L normal saline for pain, dehydration Reevaluation of the patient after these medicines showed that the patient improved I have reviewed the patients home medicines and have made adjustments as needed   Critical Interventions:     Consults/Attending Physician   I discussed this case with my attending physician who cosigned this note including patient's presenting symptoms, physical exam, and planned diagnostics and interventions. Attending physician stated agreement with plan or made changes to plan which were implemented.   Discussed with Dr. Marlowe Sax of hospitalist service who will admit to medicine  Reevaluation:  After the interventions noted above I re-evaluated patient and found that they have :stayed the same   Social Determinants of Health:      Problem List / ED Course:  Syncope.  Concerned that patient had no prodromal symptoms and fell straight to her face twice today.  Has no history of syncopal episodes.  From a trauma standpoint she is  overall well-appearing.  She has not had an echocardiogram.  Will consult for admission to medicine   Dispostion:  After consideration of the diagnostic results and the patients response to treatment, I feel that the patent would benefit from admission  Final Clinical Impression(s) / ED Diagnoses Final diagnoses:  Syncope, unspecified syncope type    Rx / DC Orders ED Discharge Orders     None         Tedd Sias, [place] 02/02/22 2045    Varney Biles,  MD 02/03/22 1525

## 2022-02-02 NOTE — ED Notes (Signed)
Complaining of multiple falls/syncopal episodes, struck head on the left side and has left arm numbness/tingling. Pt also has pain in the neck/back. Hx of neck and back pain but this is worse today after the falls. Only obvious injury is to the left side of forehead, swelling. Numbness/tingling only to the out side of forearm and outer 3 fingers.

## 2022-02-02 NOTE — ED Notes (Signed)
Report given to the Floor. 

## 2022-02-02 NOTE — ED Notes (Signed)
EDP [place] made aware of pts c/o of left arm/leg "heaviness"

## 2022-02-02 NOTE — Assessment & Plan Note (Signed)
Resume oxycodone 10 mg decreased every 6 hours as needed Family was noticing that the patient was somewhat wobbly Prior to her falls Patient denies taking excessive amounts Denies depressive episodes

## 2022-02-02 NOTE — Assessment & Plan Note (Signed)
Patient has chronic left-sided numbness/tingling but feels it is somewhat worse. May benefit from MRI of cervical spine  This presentation also could be related to prior history of cervical stenosis and cervical myelopathy Patient will need further imaging especially given chronic left-sided weakness and paresthesias is somewhat worse. Discussed with patient she does not wish to undergo MRI unless she is put to sleep patient is also DNR. Ordered MRI cervical spine and MRI brain we will start with just will dose of Xanax as needed to see if able to tolerate if unable will need to be transferred to [facility] and anesthesia has to be consulted risk-benefit analysis in this patient is very difficult given difficult airway DNR DNI status and morbid obesity If MRI abnormal will need neurosurgery versus neurology consult

## 2022-02-02 NOTE — Plan of Care (Signed)
[facility] will assume care on arrival to accepting facility. Until arrival, care as per EDP. However, [facility] available 24/7 for questions and assistance.  Nursing staff, please page [facility] Admits and Consults ([phone]) as soon as the patient arrives to the hospital.   

## 2022-02-02 NOTE — ED Notes (Signed)
Report given to [facility]. 

## 2022-02-02 NOTE — Subjective & Objective (Signed)
Patient has been having syncopal episodes x2 today first was around 830 woke up suddenly on the ground with epistaxis second episode was around 1 PM when she was walking her dog again was witnessed by family she was noted to be, well believed by family. At some point patient was endorsing left arm and leg heaviness. Has had at least 2 falls today struck head on the left side of also reports pain in the neck and back daily numbness and tingling was mainly to the forearm and 3 fingers Patient has history of chronic neck back pain. She has poorly controlled hypertension and diabetes and CKD chronic anemia No associated bowel bladder incontinence no seizure No postictal state No prodromal state prior to this

## 2022-02-02 NOTE — Assessment & Plan Note (Signed)
Will need nutritional follow-up as an outpatient 

## 2022-02-02 NOTE — Assessment & Plan Note (Addendum)
Etiology unclear. Troponin unremarkable BNP unremarkable Obtain echogram patient endorsing bilateral leg edema and occasional pleuritic chest/back pain.  Will obtain CTA to rule out PE May need cardiology evaluation in a.m. if abnormal echogram Given no prodromal state prior to syncope will need further work-up Seizure much less likely but will order EEG for completion

## 2022-02-02 NOTE — ED Triage Notes (Signed)
Pt POV with c/o syncopal episode x2 today.  1st at 0830, pt was walking dog, just remembers waking up on ground with epistaxis.   2nd episode- appx 20 min PTA, was walking dog again. Family witnessed "she was all wobbly."  Pt AOx4, c/o left arm and left leg "heaviness."

## 2022-02-02 NOTE — Assessment & Plan Note (Signed)
Continue Lexapro 20 mg daily.

## 2022-02-03 ENCOUNTER — Observation Stay (HOSPITAL_BASED_OUTPATIENT_CLINIC_OR_DEPARTMENT_OTHER): Payer: Medicaid Other

## 2022-02-03 ENCOUNTER — Observation Stay (HOSPITAL_COMMUNITY): Payer: Medicaid Other

## 2022-02-03 ENCOUNTER — Encounter (HOSPITAL_COMMUNITY): Payer: Medicaid Other

## 2022-02-03 ENCOUNTER — Other Ambulatory Visit: Payer: Self-pay

## 2022-02-03 DIAGNOSIS — R55 Syncope and collapse: Secondary | ICD-10-CM | POA: Diagnosis not present

## 2022-02-03 DIAGNOSIS — Z981 Arthrodesis status: Secondary | ICD-10-CM | POA: Diagnosis not present

## 2022-02-03 DIAGNOSIS — N184 Chronic kidney disease, stage 4 (severe): Secondary | ICD-10-CM | POA: Diagnosis not present

## 2022-02-03 DIAGNOSIS — I459 Conduction disorder, unspecified: Secondary | ICD-10-CM | POA: Diagnosis not present

## 2022-02-03 DIAGNOSIS — Z794 Long term (current) use of insulin: Secondary | ICD-10-CM | POA: Diagnosis not present

## 2022-02-03 DIAGNOSIS — Z7984 Long term (current) use of oral hypoglycemic drugs: Secondary | ICD-10-CM | POA: Diagnosis not present

## 2022-02-03 DIAGNOSIS — G959 Disease of spinal cord, unspecified: Secondary | ICD-10-CM | POA: Diagnosis not present

## 2022-02-03 DIAGNOSIS — Z79899 Other long term (current) drug therapy: Secondary | ICD-10-CM | POA: Diagnosis not present

## 2022-02-03 DIAGNOSIS — R600 Localized edema: Secondary | ICD-10-CM | POA: Diagnosis present

## 2022-02-03 DIAGNOSIS — I129 Hypertensive chronic kidney disease with stage 1 through stage 4 chronic kidney disease, or unspecified chronic kidney disease: Secondary | ICD-10-CM | POA: Diagnosis not present

## 2022-02-03 DIAGNOSIS — D631 Anemia in chronic kidney disease: Secondary | ICD-10-CM | POA: Diagnosis not present

## 2022-02-03 DIAGNOSIS — Z6841 Body Mass Index (BMI) 40.0 and over, adult: Secondary | ICD-10-CM | POA: Diagnosis not present

## 2022-02-03 DIAGNOSIS — E1122 Type 2 diabetes mellitus with diabetic chronic kidney disease: Secondary | ICD-10-CM | POA: Diagnosis not present

## 2022-02-03 LAB — FOLATE: Folate: 23.3 ng/mL (ref >5.9)

## 2022-02-03 LAB — COMPREHENSIVE METABOLIC PANEL
ALT: 18 U/L (ref 0–44)
ALT: 17 U/L (ref 0–44)
AST: 18 U/L (ref 15–41)
AST: 19 U/L (ref 15–41)
Albumin: 3.3 g/dL — ABNORMAL LOW (ref 3.5–5.0)
Albumin: 3.3 g/dL — ABNORMAL LOW (ref 3.5–5.0)
Alkaline Phosphatase: 48 U/L (ref 38–126)
Alkaline Phosphatase: 49 U/L (ref 38–126)
Anion gap: 8 (ref 5–15)
Anion gap: 6 (ref 5–15)
BUN: 38 mg/dL — ABNORMAL HIGH (ref 6–20)
BUN: 39 mg/dL — ABNORMAL HIGH (ref 6–20)
CO2: 22 mmol/L (ref 22–32)
CO2: 23 mmol/L (ref 22–32)
Calcium: 9 mg/dL (ref 8.9–10.3)
Calcium: 9.1 mg/dL (ref 8.9–10.3)
Chloride: 110 mmol/L (ref 98–111)
Chloride: 114 mmol/L — ABNORMAL HIGH (ref 98–111)
Creatinine, Ser: 2.55 mg/dL — ABNORMAL HIGH (ref 0.44–1.00)
Creatinine, Ser: 2.6 mg/dL — ABNORMAL HIGH (ref 0.44–1.00)
GFR, Estimated: 24 mL/min — ABNORMAL LOW (ref >60)
GFR, Estimated: 23 mL/min — ABNORMAL LOW (ref >60)
Glucose, Bld: 109 mg/dL — ABNORMAL HIGH (ref 70–99)
Glucose, Bld: 202 mg/dL — ABNORMAL HIGH (ref 70–99)
Potassium: 4 mmol/L (ref 3.5–5.1)
Potassium: 4.2 mmol/L (ref 3.5–5.1)
Sodium: 140 mmol/L (ref 135–145)
Sodium: 143 mmol/L (ref 135–145)
Total Bilirubin: 0.8 mg/dL (ref 0.3–1.2)
Total Bilirubin: 0.8 mg/dL (ref 0.3–1.2)
Total Protein: 6.8 g/dL (ref 6.5–8.1)
Total Protein: 6.5 g/dL (ref 6.5–8.1)

## 2022-02-03 LAB — CBC
HCT: 31.6 % — ABNORMAL LOW (ref 36.0–46.0)
Hemoglobin: 10.1 g/dL — ABNORMAL LOW (ref 12.0–15.0)
MCH: 30.5 pg (ref 26.0–34.0)
MCHC: 32 g/dL (ref 30.0–36.0)
MCV: 95.5 fL (ref 80.0–100.0)
Platelets: 161 10*3/uL (ref 150–400)
RBC: 3.31 MIL/uL — ABNORMAL LOW (ref 3.87–5.11)
RDW: 13 % (ref 11.5–15.5)
WBC: 6.9 10*3/uL (ref 4.0–10.5)
nRBC: 0 % (ref 0.0–0.2)

## 2022-02-03 LAB — GLUCOSE, CAPILLARY
Glucose-Capillary: 106 mg/dL — ABNORMAL HIGH (ref 70–99)
Glucose-Capillary: 125 mg/dL — ABNORMAL HIGH (ref 70–99)
Glucose-Capillary: 157 mg/dL — ABNORMAL HIGH (ref 70–99)
Glucose-Capillary: 170 mg/dL — ABNORMAL HIGH (ref 70–99)
Glucose-Capillary: 174 mg/dL — ABNORMAL HIGH (ref 70–99)
Glucose-Capillary: 83 mg/dL (ref 70–99)

## 2022-02-03 LAB — TYPE AND SCREEN
ABO/RH(D): O POS
Antibody Screen: NEGATIVE

## 2022-02-03 LAB — RAPID URINE DRUG SCREEN, HOSP PERFORMED
Amphetamines: NOT DETECTED
Barbiturates: NOT DETECTED
Benzodiazepines: NOT DETECTED
Cocaine: NOT DETECTED
Opiates: NOT DETECTED
Tetrahydrocannabinol: NOT DETECTED

## 2022-02-03 LAB — SODIUM, URINE, RANDOM: Sodium, Ur: 104 mmol/L

## 2022-02-03 LAB — LACTIC ACID, PLASMA
Lactic Acid, Venous: 0.6 mmol/L (ref 0.5–1.9)
Lactic Acid, Venous: 1 mmol/L (ref 0.5–1.9)

## 2022-02-03 LAB — ECHOCARDIOGRAM COMPLETE
Area-P 1/2: 4.17 cm2
Height: 55.5 in
S' Lateral: 2.6 cm
Weight: 3632 oz

## 2022-02-03 LAB — CK: Total CK: 228 U/L (ref 38–234)

## 2022-02-03 LAB — IRON AND TIBC
Iron: 90 ug/dL (ref 28–170)
Saturation Ratios: 32 % — ABNORMAL HIGH (ref 10.4–31.8)
TIBC: 283 ug/dL (ref 250–450)
UIBC: 193 ug/dL

## 2022-02-03 LAB — CREATININE, URINE, RANDOM: Creatinine, Urine: 45 mg/dL

## 2022-02-03 LAB — HEMOGLOBIN A1C
Hgb A1c MFr Bld: 6.8 % — ABNORMAL HIGH (ref 4.8–5.6)
Mean Plasma Glucose: 148.46 mg/dL

## 2022-02-03 LAB — OSMOLALITY, URINE: Osmolality, Ur: 358 mOsm/kg (ref 300–900)

## 2022-02-03 LAB — VITAMIN B12: Vitamin B-12: 377 pg/mL (ref 180–914)

## 2022-02-03 LAB — TSH: TSH: 2.713 u[IU]/mL (ref 0.350–4.500)

## 2022-02-03 LAB — MAGNESIUM: Magnesium: 1.7 mg/dL (ref 1.7–2.4)

## 2022-02-03 LAB — ABO/RH: ABO/RH(D): O POS

## 2022-02-03 LAB — PHOSPHORUS: Phosphorus: 4 mg/dL (ref 2.5–4.6)

## 2022-02-03 LAB — FERRITIN: Ferritin: 193 ng/mL (ref 11–307)

## 2022-02-03 MED ORDER — ORAL CARE MOUTH RINSE: 15.0000 mL | OROMUCOSAL | Status: DC | PRN

## 2022-02-03 MED ORDER — PERFLUTREN LIPID MICROSPHERE
1.0000 mL | INTRAVENOUS | Status: AC | PRN
  Administered 2022-02-03: 3 mL via INTRAVENOUS

## 2022-02-03 MED ORDER — GABAPENTIN 400 MG PO CAPS
400.0000 mg | ORAL_CAPSULE | Freq: Three times a day (TID) | ORAL | Status: DC
  Administered 2022-02-03 – 2022-02-05 (×5): 400 mg via ORAL
  Filled 2022-02-03 (×5): qty 1

## 2022-02-03 MED ORDER — FERROUS SULFATE 325 (65 FE) MG PO TABS
325.0000 mg | ORAL_TABLET | Freq: Every day | ORAL | Status: DC
  Administered 2022-02-04 – 2022-02-05 (×2): 325 mg via ORAL
  Filled 2022-02-03 (×2): qty 1

## 2022-02-03 MED ORDER — CARVEDILOL 12.5 MG PO TABS
12.5000 mg | ORAL_TABLET | Freq: Two times a day (BID) | ORAL | Status: DC
  Administered 2022-02-03 – 2022-02-05 (×4): 12.5 mg via ORAL
  Filled 2022-02-03 (×4): qty 1

## 2022-02-03 MED ORDER — ALPRAZOLAM 0.5 MG PO TABS
0.5000 mg | ORAL_TABLET | ORAL | Status: DC | PRN
Start: 2022-02-03 — End: 2022-02-05

## 2022-02-03 MED ORDER — MAGNESIUM SULFATE IN D5W 1-5 GM/100ML-% IV SOLN
1.0000 g | Freq: Once | INTRAVENOUS | Status: AC
  Administered 2022-02-03: 1 g via INTRAVENOUS
  Filled 2022-02-03: qty 100

## 2022-02-03 NOTE — Progress Notes (Signed)
OT Cancellation Note  Patient Details Name: Briana Williams MRN: [id number] DOB: 01/17/1981   Cancelled Treatment:    Reason Eval/Treat Not Completed: OT screened, no needs identified, will sign off Patient and family in room report patient is able to complete ADLs with MI on this date. Patient reported she did not need therapy at this time. OT to sign off at this time.  Jackelyn Poling OTR/L, [facility] Office# [phone] Pager# [phone]  02/03/2022, 1:33 PM

## 2022-02-03 NOTE — Plan of Care (Signed)

## 2022-02-03 NOTE — Assessment & Plan Note (Signed)
order Dopplers Evaluate for DVT

## 2022-02-03 NOTE — Progress Notes (Signed)
PROGRESS NOTE  Briana Williams HFW:[id number] DOB: [birth year] DOA: 02/02/2022 PCP: Ladell Pier, MD   LOS: 0 days   Brief Narrative / Interim history: 41 year old female with CKD stage IV, DM 2, HTN, HLD, chronic pain, severe scoliosis status post 40+ surgeries, progressive cervical myelopathy comes into the hospital with syncope x2.  She was walking her dog and apparently passed out without any warning or prodromal signs.  She reports pain in her head and neck, as well as more recently has had increased left-sided weakness.  She also reports lower extremity swelling for the past 3 weeks.  Also, her husband died recently and she is under a lot of stress.  She has started BuSpar as well as hydroxyzine in the last 3 weeks.  Subjective / 24h Interval events: She is doing well this morning, complains of left-sided weakness still.  No chest pain, no palpitations.  She denies any fever or chills.  Assesement and Plan: Principal Problem:   Syncope Active Problems:   Cervical myelopathy (HCC)   Chronic pain syndrome   DM (diabetes mellitus), type 2 with renal complications (HCC)   CKD (chronic kidney disease), stage IV (HCC)   Anemia in chronic kidney disease   Essential hypertension, benign   Depression   Morbid obesity (HCC)   Bilateral leg edema  Principal problem Syncope-unclear etiology, but concern for cardiac more so given lack of prodromal symptoms.  Cardiac work-up underway, obtain 2D echo, carotids, cardiology to see.  Discussed with Dr. Debara Pickett  Active problems Cervical myelopathy, chronic pain, severe scoliosis, progressive left-sided weakness-due to progressive left-sided weakness we will do an MRI of the brain as well as C-spine.  Left-sided weakness is persistent today.  Ordered for MRI with general anesthesia if she is unable to tolerate MRI while awake.  CKD stage IV-creatinine appears at baseline, she is ranging anywhere between 2.5-low threes.  Avoid  nephrotoxins  Morbid obesity-BMI 51, she would benefit from weight loss  Essential hypertension -resume home Coreg, hold hydralazine  Leg swelling, positive D-dimer-obtain lower extremity Dopplers to rule out DVT.  Unable to obtain CT angiogram due to renal failure, but PE is less likely without chest pain, tachycardia, normal RV  DM-controlled, last A1c 6.8.  Continue Lantus and sliding scale.  CBGs unremarkable  CBG (last 3)  Recent Labs    02/03/22 0004 02/03/22 0407 02/03/22 0722  GLUCAP 83 174* 106*   Lab Results  Component Value Date   HGBA1C 6.8 (H) 02/02/2022     Scheduled Meds:  busPIRone  10 mg Oral TID   carvedilol  12.5 mg Oral BID WC   escitalopram  20 mg Oral Daily   [START ON 02/04/2022] ferrous sulfate  325 mg Oral Q breakfast   gabapentin  400 mg Oral TID   insulin aspart  0-9 Units Subcutaneous Q4H   insulin glargine-yfgn  30 Units Subcutaneous QHS   sodium chloride flush  3 mL Intravenous Q12H   Continuous Infusions:  sodium chloride     PRN Meds:.sodium chloride, acetaminophen **OR** acetaminophen, ALPRAZolam, amitriptyline, mouth rinse, oxyCODONE, perflutren lipid microspheres (DEFINITY) IV suspension, sodium chloride flush, tiZANidine  Diet Orders (From admission, onward)     Start     Ordered   02/03/22 0735  Diet regular Room service appropriate? Yes; Fluid consistency: Thin  Diet effective now       Question Answer Comment  Room service appropriate? Yes   Fluid consistency: Thin      02/03/22 0734  DVT prophylaxis: SCDs Start: 02/02/22 2311   Lab Results  Component Value Date   PLT 161 02/03/2022      Code Status: DNR  Family Communication: No family at bedside  Status is: Observation The patient will require care spanning > 2 midnights and should be moved to inpatient because: Persistent left-sided weakness, MRI pending  Level of care: Telemetry  Consultants:  Cardiology   Procedures:  2D echo:  pending  Objective: Vitals:   02/02/22 2140 02/02/22 2235 02/03/22 0410 02/03/22 1031  BP: (!) 164/72 (!) 193/90 117/69 (!) 148/81  Pulse: 70 74 72 78  Resp: 16 18 16 18   Temp:  98 F (36.7 C) 98.2 F (36.8 C) 98.2 F (36.8 C)  TempSrc:  Oral Oral Oral  SpO2: 100% 100% 95% 98%  Weight:      Height:        Intake/Output Summary (Last 24 hours) at 02/03/2022 1100 Last data filed at 02/03/2022 0904 Gross per 24 hour  Intake 1516.47 ml  Output 1400 ml  Net 116.47 ml   Wt Readings from Last 3 Encounters:  02/02/22 103 kg  12/20/21 104.3 kg  11/06/21 104.3 kg    Examination:  Constitutional: NAD Eyes: no scleral icterus ENMT: Mucous membranes are moist.  Neck: normal, supple Respiratory: clear to auscultation bilaterally, no wheezing, no crackles. Normal respiratory effort.  Cardiovascular: Regular rate and rhythm, no murmurs / rubs / gallops.  1+ lower extremity edema Abdomen: non distended, no tenderness. Bowel sounds positive.  Musculoskeletal: no clubbing / cyanosis.  Skin: no rashes  Data Reviewed: I have independently reviewed following labs and imaging studies   CBC Recent Labs  Lab 02/02/22 1339 02/02/22 2313 02/03/22 0239  WBC 7.1 5.8 6.9  HGB 9.7* 10.1* 10.1*  HCT 30.1* 30.9* 31.6*  PLT 145* 152 161  MCV 92.9 93.9 95.5  MCH 29.9 30.7 30.5  MCHC 32.2 32.7 32.0  RDW 13.1 13.0 13.0  LYMPHSABS  --  2.0  --   MONOABS  --  0.4  --   EOSABS  --  0.2  --   BASOSABS  --  0.0  --     Recent Labs  Lab 02/02/22 1339 02/02/22 1558 02/02/22 2014 02/02/22 2313 02/03/22 0239  NA 135  --   --  140 143  K 5.5* 4.5  --  4.0 4.2  CL 105  --   --  110 114*  CO2 21*  --   --  22 23  GLUCOSE 174*  --   --  109* 202*  BUN 40*  --   --  38* 39*  CREATININE 2.55*  --   --  2.55* 2.60*  CALCIUM 9.3  --   --  9.0 9.1  AST  --   --   --  18 19  ALT  --   --   --  18 17  ALKPHOS  --   --   --  48 49  BILITOT  --   --   --  0.8 0.8  ALBUMIN  --   --   --  3.3*  3.3*  MG  --   --   --  1.7  --   DDIMER  --   --   --  0.77*  --   LATICACIDVEN  --   --   --  0.6 1.0  INR  --   --   --  0.9  --   TSH  --   --   --  2.713  --   BNP  --   --  65.8  --   --     ------------------------------------------------------------------------------------------------------------------ No results for input(s): "CHOL", "HDL", "LDLCALC", "TRIG", "CHOLHDL", "LDLDIRECT" in the last 72 hours.  Lab Results  Component Value Date   HGBA1C 8.9 (A) 06/27/2021   ------------------------------------------------------------------------------------------------------------------ Recent Labs    02/02/22 2313  TSH 2.713    Cardiac Enzymes No results for input(s): "CKMB", "TROPONINI", "MYOGLOBIN" in the last 168 hours.  Invalid input(s): "CK" ------------------------------------------------------------------------------------------------------------------    Component Value Date/Time   BNP 65.8 02/02/2022 2014    CBG: Recent Labs  Lab 02/02/22 1309 02/02/22 2140 02/03/22 0004 02/03/22 0407 02/03/22 0722  GLUCAP 187* 83 83 174* 106*    No results found for this or any previous visit (from the past 240 hour(s)).   Radiology Studies: DG CHEST PORT 1 VIEW  Result Date: 02/03/2022 CLINICAL DATA:  Syncope EXAM: PORTABLE CHEST 1 VIEW COMPARISON:  12/17/2021 FINDINGS: Low volume chest with interstitial crowding at the bases. There is no edema, consolidation, effusion, or pneumothorax. Borderline heart size. Spinal fusion as previously noted. IMPRESSION: Chronic low volume chest.  No acute finding. Electronically Signed   By: Jorje Guild M.D.   On: 02/03/2022 06:26   CT Thoracic Spine Wo Contrast  Result Date: 02/02/2022 CLINICAL DATA:  Left arm and left leg heaviness after 2 syncopal episodes earlier today. History of cervical fusion. EXAM: CT THORACIC SPINE WITHOUT CONTRAST TECHNIQUE: Multidetector CT images of the thoracic were obtained using the standard  protocol without intravenous contrast. RADIATION DOSE REDUCTION: This exam was performed according to the departmental dose-optimization program which includes automated exposure control, adjustment of the mA and/or kV according to patient size and/or use of iterative reconstruction technique. COMPARISON:  Thoracic spine x-rays dated Dec 09, 2020. MRI thoracic spine dated January 04, 2019. FINDINGS: Alignment: Unchanged dextroscoliosis of the midthoracic spine and levoscoliosis of the upper lumbar spine. No listhesis. Vertebrae: No acute fracture or focal pathologic process. Paraspinal and other soft tissues: Punctate bilateral renal calculi. Prior cholecystectomy. Disc levels: Prior posterior cervicothoracic fusion to T3. Diffuse solid osseous fusion of the thoracic vertebral bodies and posterior elements. Peritoneal right remnant again seen on the right at the thoracolumbar junction. IMPRESSION: 1. No acute osseous abnormality. 2. Solid osseous fusion of the thoracic spine. 3. Punctate bilateral nephrolithiasis. Electronically Signed   By: Titus Dubin M.D.   On: 02/02/2022 16:27   DG Shoulder Left  Result Date: 02/02/2022 CLINICAL DATA:  Left shoulder pain EXAM: LEFT SHOULDER - 2+ VIEW COMPARISON:  None Available. FINDINGS: There is no evidence of fracture or dislocation. There is no evidence of arthropathy or other focal bone abnormality. Partially imaged cervicothoracic fusion hardware. Soft tissues are unremarkable. IMPRESSION: No fracture or dislocation of the left shoulder. Joint spaces are preserved. Electronically Signed   By: Delanna Ahmadi M.D.   On: 02/02/2022 15:39   CT Head Wo Contrast  Result Date: 02/02/2022 CLINICAL DATA:  Neck trauma, midline tenderness (Age 69-64y); Head trauma, moderate-severe EXAM: CT HEAD WITHOUT CONTRAST CT CERVICAL SPINE WITHOUT CONTRAST TECHNIQUE: Multidetector CT imaging of the head and cervical spine was performed following the standard protocol without intravenous  contrast. Multiplanar CT image reconstructions of the cervical spine were also generated. RADIATION DOSE REDUCTION: This exam was performed according to the departmental dose-optimization program which includes automated exposure control, adjustment of the mA and/or kV according to patient size and/or use of iterative reconstruction technique. COMPARISON:  CT cervical spine  04/27/2019. FINDINGS: CT HEAD FINDINGS Brain: No evidence of acute infarction, hemorrhage, hydrocephalus, extra-axial collection or mass lesion/mass effect. Vascular: No hyperdense vessel identified. Skull: No acute fracture. Sinuses/Orbits: Clear visualized sinuses. No acute orbital findings. Other: No mastoid effusions. CT CERVICAL SPINE FINDINGS Alignment: No substantial sagittal subluxation. Levocurvature of the lower cervical spine. Skull base and vertebrae: Posterior fusion from C2-C3 and anterior fusion from C3-C6. Extensive bony fusion throughout the cervical and upper thoracic spine. Streak artifact from hardware limits assessment. No evidence of acute fracture. Remote/healed dens fracture. Soft tissues and spinal canal: No prevertebral fluid or swelling. No visible canal hematoma. Disc levels: Multilevel degenerative change including facet arthropathy and endplate spurring. Varying degrees of neural foraminal stenosis. Extensive posterior decompression. Upper chest: Visualized lung apices are clear. IMPRESSION: 1. No evidence of acute intracranial abnormality. 2. Remote dens fracture without change in alignment. 3. No evidence of acute fracture or traumatic malalignment. 4. Posterior fusion from C2-C3 and anterior fusion from C3-C6. 5. Extensive bony fusion throughout the cervical and upper thoracic spine. Electronically Signed   By: Margaretha Sheffield M.D.   On: 02/02/2022 15:22   CT Cervical Spine Wo Contrast  Result Date: 02/02/2022 CLINICAL DATA:  Neck trauma, midline tenderness (Age 17-64y); Head trauma, moderate-severe EXAM: CT  HEAD WITHOUT CONTRAST CT CERVICAL SPINE WITHOUT CONTRAST TECHNIQUE: Multidetector CT imaging of the head and cervical spine was performed following the standard protocol without intravenous contrast. Multiplanar CT image reconstructions of the cervical spine were also generated. RADIATION DOSE REDUCTION: This exam was performed according to the departmental dose-optimization program which includes automated exposure control, adjustment of the mA and/or kV according to patient size and/or use of iterative reconstruction technique. COMPARISON:  CT cervical spine 04/27/2019. FINDINGS: CT HEAD FINDINGS Brain: No evidence of acute infarction, hemorrhage, hydrocephalus, extra-axial collection or mass lesion/mass effect. Vascular: No hyperdense vessel identified. Skull: No acute fracture. Sinuses/Orbits: Clear visualized sinuses. No acute orbital findings. Other: No mastoid effusions. CT CERVICAL SPINE FINDINGS Alignment: No substantial sagittal subluxation. Levocurvature of the lower cervical spine. Skull base and vertebrae: Posterior fusion from C2-C3 and anterior fusion from C3-C6. Extensive bony fusion throughout the cervical and upper thoracic spine. Streak artifact from hardware limits assessment. No evidence of acute fracture. Remote/healed dens fracture. Soft tissues and spinal canal: No prevertebral fluid or swelling. No visible canal hematoma. Disc levels: Multilevel degenerative change including facet arthropathy and endplate spurring. Varying degrees of neural foraminal stenosis. Extensive posterior decompression. Upper chest: Visualized lung apices are clear. IMPRESSION: 1. No evidence of acute intracranial abnormality. 2. Remote dens fracture without change in alignment. 3. No evidence of acute fracture or traumatic malalignment. 4. Posterior fusion from C2-C3 and anterior fusion from C3-C6. 5. Extensive bony fusion throughout the cervical and upper thoracic spine. Electronically Signed   By: Margaretha Sheffield M.D.   On: 02/02/2022 15:22     Marzetta Board, MD, PhD [facility]  Between 7 am - 7 pm I am available, please contact me via Amion (for emergencies) or Securechat (non urgent messages)  Between 7 pm - 7 am I am not available, please contact night coverage MD/APP via Amion

## 2022-02-03 NOTE — Progress Notes (Signed)
PT Cancellation Note  Patient Details Name: Briana Williams MRN: [id number] DOB: [birth year]   Cancelled Treatment:    Reason Eval/Treat Not Completed: PT screened, no needs identified, will sign off   [facility] 02/03/2022, 12:32 PM

## 2022-02-03 NOTE — Consult Note (Signed)
Cardiology Consultation:   Patient ID: Briana Williams MRN: [id number]; DOB: Feb 26, 1981  Admit date: 02/02/2022 Date of Consult: 02/03/2022  PCP:  Ladell Pier, MD   [facility] Providers Cardiologist:  New to Dr Debara Pickett   Patient Profile:   DEMITRIA Williams is a 41 y.o. female with a hx of CKD, type 2 diabetes, HTN, hyperlipidemia, OSA, depression, Kyphoscoliosis, chronic pain syndrome, morbid obesity, who is being seen 02/03/2022 for the evaluation of syncope at the request of Dr Roel Cluck.  Admitted 11/2021 for acute cholecystitis, AKI and hypertensive urgency. Underwent cholecystectomy.  History of Present Illness:   Ms. Heimann presented after 2 episodes of syncope.  First episode occurred 8:30 in the morning.  He remembers walking up on the floor with nosebleed.  No prodromal symptoms.  This was observed by family from distance.  Second episode occurred around 1:30 PM without prodromal symptoms as well.  Complaining of neck pain and headache.  No seizure-like activity or bowel/bladder incontinence.  Patient has a chronic left-sided numbness and tingling.  Patient was admitted for further evaluation.  CT of head without acute finding.  Troponin negative. Creatinine 2.55>>2.6 Potassium 4.2 Hemoglobin 10.1 TSH normal Echocardiogram pending  Urine drug screen clear  Monitor shows sinus rhythm with significant artifact - she has reported recent palpitations, dating back to May when she had her gallbladder out. She reports history of syncope x 15 spells, most were about 4 years ago prior to her neck surgeries - This was a [employer]. She had done well with regard to this until recently.  Past Medical History:  Diagnosis Date   Arthritis    GERD (gastroesophageal reflux disease)    Headache    migraine 2 weeks ago   Hyperlipidemia    Hypertension associated with diabetes ([place])    Renal disorder    has both kidneys, compacted and 'squashed together"   Scoliosis     Sleep apnea    in the midst of being tested for it.   waiting on scheduling    Type 2 diabetes mellitus treated with insulin ([place])    dx 2000    Past Surgical History:  Procedure Laterality Date   BACK SURGERY     CERVICAL FUSION     CHOLECYSTECTOMY N/A 12/20/2021   Procedure: LAPAROSCOPIC CHOLECYSTECTOMY;  Surgeon: Clovis Riley, MD;  Location: [facility];  Service: General;  Laterality: N/A;   frontal closed sutures to forehead     Harrington Rod placement     HIP SURGERY     staph infection   [place] N/A 02/25/2017   Procedure: RADIOLOGY WITH ANESTHESIA/MRI THORACIC SPINE AND LEFT SHOULDER WITHOUT CONTRAST.;  Surgeon: Radiologist, Medication, MD;  Location: [facility];  Service: Radiology;  Laterality: N/A;       Inpatient Medications: Scheduled Meds:  busPIRone  10 mg Oral TID   escitalopram  20 mg Oral Daily   insulin aspart  0-9 Units Subcutaneous Q4H   insulin glargine-yfgn  30 Units Subcutaneous QHS   sodium chloride flush  3 mL Intravenous Q12H   Continuous Infusions:  sodium chloride     PRN Meds: sodium chloride, acetaminophen **OR** acetaminophen, ALPRAZolam, amitriptyline, mouth rinse, oxyCODONE, sodium chloride flush, tiZANidine  Allergies:    Allergies  Allergen Reactions   Hydrocodone Itching    Note: benadryl does NOT help   Lisinopril Nausea And Vomiting    Caused nausea   Metformin And Related Other (See Comments)  Causes blisters on skin   Norvasc [Amlodipine Besylate] Swelling    Ankle swelling   Other Other (See Comments)    Paper Tape causes Blisters   Ultrasound Gel Rash and Other (See Comments)    Patient gets a red rash with use of ultrasound gel/ burns skin    Social History:   Social History   Socioeconomic History   Marital status: Married    Spouse name: Not on file   Number of children: 3   Years of education: Not on file   Highest education level: Not on file  Occupational History     Employer: UNEMPLOYED  Tobacco Use   Smoking status: Never   Smokeless tobacco: Never  Vaping Use   Vaping Use: Never used  Substance and Sexual Activity   Alcohol use: No   Drug use: No   Sexual activity: Not Currently    Birth control/protection: Other-see comments    Comment: unable to have children d/t scoliosis  Other Topics Concern   Not on file  Social History Narrative   Not on file   Social Determinants of Health   Financial Resource Strain: Low Risk  (12/27/2021)   Overall Financial Resource Strain (CARDIA)    Difficulty of Paying Living Expenses: Not hard at all  Food Insecurity: No Food Insecurity (12/27/2021)   Hunger Vital Sign    Worried About Running Out of Food in the Last Year: Never true    Ran Out of Food in the Last Year: Never true  Transportation Needs: No Transportation Needs (12/27/2021)   PRAPARE - Hydrologist (Medical): No    Lack of Transportation (Non-Medical): No  Physical Activity: Inactive (12/27/2021)   Exercise Vital Sign    Days of Exercise per Week: 0 days    Minutes of Exercise per Session: 0 min  Stress: Stress Concern Present (12/27/2021)   [place]    Feeling of Stress : Very much  Social Connections: Socially Integrated (12/27/2021)   Social Connection and Isolation Panel [NHANES]    Frequency of Communication with Friends and Family: More than three times a week    Frequency of Social Gatherings with Friends and Family: More than three times a week    Attends Religious Services: More than 4 times per year    Active Member of [employer] or Organizations: Yes    Attends Music therapist: More than 4 times per year    Marital Status: Married  Human resources officer Violence: Not on file    Family History:    Family History  Problem Relation Age of Onset   Cancer Maternal Grandmother        mandibular cancer    Diabetes Mother    Heart  failure Mother    CAD Mother        triple CABG   Diabetes Maternal Aunt    Stroke Father      ROS:   Pertinent items noted in HPI and remainder of comprehensive ROS otherwise negative.  Physical Exam/Data:   Vitals:   02/02/22 2100 02/02/22 2140 02/02/22 2235 02/03/22 0410  BP: (!) 179/81 (!) 164/72 (!) 193/90 117/69  Pulse: 71 70 74 72  Resp: 18 16 18 16   Temp:   98 F (36.7 C) 98.2 F (36.8 C)  TempSrc:   Oral Oral  SpO2: 99% 100% 100% 95%  Weight:      Height:  Intake/Output Summary (Last 24 hours) at 02/03/2022 0951 Last data filed at 02/03/2022 0904 Gross per 24 hour  Intake 1516.47 ml  Output 1400 ml  Net 116.47 ml      02/02/2022    1:09 PM 12/20/2021   11:46 AM 11/06/2021    8:57 AM  Last 3 Weights  Weight (lbs) 227 lb 230 lb 230 lb  Weight (kg) 102.967 kg 104.327 kg 104.327 kg     Body mass index is 51.81 kg/m.   General appearance: alert, no distress, and morbidly obese Neck: no carotid bruit, no JVD, and thyroid not enlarged, symmetric, no tenderness/mass/nodules Lungs: clear to auscultation bilaterally Heart: regular rate and rhythm Abdomen: soft, non-tender; bowel sounds normal; no masses,  no organomegaly and obese Extremities: extremities normal, atraumatic, no cyanosis or edema and large scar extending from her neck to her buttocks over the spine Pulses: 2+ and symmetric Skin: Skin color, texture, turgor normal. No rashes or lesions Neurologic: Mental status: Alert, oriented, thought content appropriate Psych: Pleasant   EKG:  The EKG was personally reviewed and demonstrates: Normal sinus rhythm at 73, small inferior Q waves  Telemetry:  Telemetry was personally reviewed and demonstrates: Sinus rhythm with significant artifact  Relevant CV Studies: Echo pending -preliminary personal review of the study images  indicates normal LV function.  Laboratory Data:  High Sensitivity Troponin:   Recent Labs  Lab 02/02/22 1339 02/02/22 1558   TROPONINIHS 5 5     Chemistry Recent Labs  Lab 02/02/22 1339 02/02/22 1558 02/02/22 2313 02/03/22 0239  NA 135  --  140 143  K 5.5* 4.5 4.0 4.2  CL 105  --  110 114*  CO2 21*  --  22 23  GLUCOSE 174*  --  109* 202*  BUN 40*  --  38* 39*  CREATININE 2.55*  --  2.55* 2.60*  CALCIUM 9.3  --  9.0 9.1  MG  --   --  1.7  --   GFRNONAA 24*  --  24* 23*  ANIONGAP 9  --  8 6    Recent Labs  Lab 02/02/22 2313 02/03/22 0239  PROT 6.8 6.5  ALBUMIN 3.3* 3.3*  AST 18 19  ALT 18 17  ALKPHOS 48 49  BILITOT 0.8 0.8   Lipids No results for input(s): "CHOL", "TRIG", "HDL", "LABVLDL", "LDLCALC", "CHOLHDL" in the last 168 hours.  Hematology Recent Labs  Lab 02/02/22 1339 02/02/22 2313 02/03/22 0239  WBC 7.1 5.8 6.9  RBC 3.24* 3.29*  3.34* 3.31*  HGB 9.7* 10.1* 10.1*  HCT 30.1* 30.9* 31.6*  MCV 92.9 93.9 95.5  MCH 29.9 30.7 30.5  MCHC 32.2 32.7 32.0  RDW 13.1 13.0 13.0  PLT 145* 152 161   Thyroid  Recent Labs  Lab 02/02/22 2313  TSH 2.713    BNP Recent Labs  Lab 02/02/22 2014  BNP 65.8    DDimer  Recent Labs  Lab 02/02/22 2313  DDIMER 0.77*     Radiology/Studies:  DG CHEST PORT 1 VIEW  Result Date: 02/03/2022 CLINICAL DATA:  Syncope EXAM: PORTABLE CHEST 1 VIEW COMPARISON:  12/17/2021 FINDINGS: Low volume chest with interstitial crowding at the bases. There is no edema, consolidation, effusion, or pneumothorax. Borderline heart size. Spinal fusion as previously noted. IMPRESSION: Chronic low volume chest.  No acute finding. Electronically Signed   By: Jorje Guild M.D.   On: 02/03/2022 06:26   CT Thoracic Spine Wo Contrast  Result Date: 02/02/2022 CLINICAL DATA:  Left arm  and left leg heaviness after 2 syncopal episodes earlier today. History of cervical fusion. EXAM: CT THORACIC SPINE WITHOUT CONTRAST TECHNIQUE: Multidetector CT images of the thoracic were obtained using the standard protocol without intravenous contrast. RADIATION DOSE REDUCTION: This exam  was performed according to the departmental dose-optimization program which includes automated exposure control, adjustment of the mA and/or kV according to patient size and/or use of iterative reconstruction technique. COMPARISON:  Thoracic spine x-rays dated Dec 09, 2020. MRI thoracic spine dated January 04, 2019. FINDINGS: Alignment: Unchanged dextroscoliosis of the midthoracic spine and levoscoliosis of the upper lumbar spine. No listhesis. Vertebrae: No acute fracture or focal pathologic process. Paraspinal and other soft tissues: Punctate bilateral renal calculi. Prior cholecystectomy. Disc levels: Prior posterior cervicothoracic fusion to T3. Diffuse solid osseous fusion of the thoracic vertebral bodies and posterior elements. Peritoneal right remnant again seen on the right at the thoracolumbar junction. IMPRESSION: 1. No acute osseous abnormality. 2. Solid osseous fusion of the thoracic spine. 3. Punctate bilateral nephrolithiasis. Electronically Signed   By: Titus Dubin M.D.   On: 02/02/2022 16:27   DG Shoulder Left  Result Date: 02/02/2022 CLINICAL DATA:  Left shoulder pain EXAM: LEFT SHOULDER - 2+ VIEW COMPARISON:  None Available. FINDINGS: There is no evidence of fracture or dislocation. There is no evidence of arthropathy or other focal bone abnormality. Partially imaged cervicothoracic fusion hardware. Soft tissues are unremarkable. IMPRESSION: No fracture or dislocation of the left shoulder. Joint spaces are preserved. Electronically Signed   By: Delanna Ahmadi M.D.   On: 02/02/2022 15:39   CT Head Wo Contrast  Result Date: 02/02/2022 CLINICAL DATA:  Neck trauma, midline tenderness (Age 63-64y); Head trauma, moderate-severe EXAM: CT HEAD WITHOUT CONTRAST CT CERVICAL SPINE WITHOUT CONTRAST TECHNIQUE: Multidetector CT imaging of the head and cervical spine was performed following the standard protocol without intravenous contrast. Multiplanar CT image reconstructions of the cervical spine were  also generated. RADIATION DOSE REDUCTION: This exam was performed according to the departmental dose-optimization program which includes automated exposure control, adjustment of the mA and/or kV according to patient size and/or use of iterative reconstruction technique. COMPARISON:  CT cervical spine 04/27/2019. FINDINGS: CT HEAD FINDINGS Brain: No evidence of acute infarction, hemorrhage, hydrocephalus, extra-axial collection or mass lesion/mass effect. Vascular: No hyperdense vessel identified. Skull: No acute fracture. Sinuses/Orbits: Clear visualized sinuses. No acute orbital findings. Other: No mastoid effusions. CT CERVICAL SPINE FINDINGS Alignment: No substantial sagittal subluxation. Levocurvature of the lower cervical spine. Skull base and vertebrae: Posterior fusion from C2-C3 and anterior fusion from C3-C6. Extensive bony fusion throughout the cervical and upper thoracic spine. Streak artifact from hardware limits assessment. No evidence of acute fracture. Remote/healed dens fracture. Soft tissues and spinal canal: No prevertebral fluid or swelling. No visible canal hematoma. Disc levels: Multilevel degenerative change including facet arthropathy and endplate spurring. Varying degrees of neural foraminal stenosis. Extensive posterior decompression. Upper chest: Visualized lung apices are clear. IMPRESSION: 1. No evidence of acute intracranial abnormality. 2. Remote dens fracture without change in alignment. 3. No evidence of acute fracture or traumatic malalignment. 4. Posterior fusion from C2-C3 and anterior fusion from C3-C6. 5. Extensive bony fusion throughout the cervical and upper thoracic spine. Electronically Signed   By: Margaretha Sheffield M.D.   On: 02/02/2022 15:22   CT Cervical Spine Wo Contrast  Result Date: 02/02/2022 CLINICAL DATA:  Neck trauma, midline tenderness (Age 63-64y); Head trauma, moderate-severe EXAM: CT HEAD WITHOUT CONTRAST CT CERVICAL SPINE WITHOUT CONTRAST TECHNIQUE:  Multidetector CT  imaging of the head and cervical spine was performed following the standard protocol without intravenous contrast. Multiplanar CT image reconstructions of the cervical spine were also generated. RADIATION DOSE REDUCTION: This exam was performed according to the departmental dose-optimization program which includes automated exposure control, adjustment of the mA and/or kV according to patient size and/or use of iterative reconstruction technique. COMPARISON:  CT cervical spine 04/27/2019. FINDINGS: CT HEAD FINDINGS Brain: No evidence of acute infarction, hemorrhage, hydrocephalus, extra-axial collection or mass lesion/mass effect. Vascular: No hyperdense vessel identified. Skull: No acute fracture. Sinuses/Orbits: Clear visualized sinuses. No acute orbital findings. Other: No mastoid effusions. CT CERVICAL SPINE FINDINGS Alignment: No substantial sagittal subluxation. Levocurvature of the lower cervical spine. Skull base and vertebrae: Posterior fusion from C2-C3 and anterior fusion from C3-C6. Extensive bony fusion throughout the cervical and upper thoracic spine. Streak artifact from hardware limits assessment. No evidence of acute fracture. Remote/healed dens fracture. Soft tissues and spinal canal: No prevertebral fluid or swelling. No visible canal hematoma. Disc levels: Multilevel degenerative change including facet arthropathy and endplate spurring. Varying degrees of neural foraminal stenosis. Extensive posterior decompression. Upper chest: Visualized lung apices are clear. IMPRESSION: 1. No evidence of acute intracranial abnormality. 2. Remote dens fracture without change in alignment. 3. No evidence of acute fracture or traumatic malalignment. 4. Posterior fusion from C2-C3 and anterior fusion from C3-C6. 5. Extensive bony fusion throughout the cervical and upper thoracic spine. Electronically Signed   By: Margaretha Sheffield M.D.   On: 02/02/2022 15:22     Assessment and Plan:    Syncope without prodrome -Ms. Plamondon is describing 2 syncopal episodes that occurred while walking her dog without any prodrome.  The falls were significant and she had nosebleed after the first 1.  She reports a history of 10-15 other syncopal episodes more than 4 years ago prior to her last neck surgery.  Those episodes seem to resolve after surgery.  Recently she has been having some left-sided weakness which makes me concerned that there may be an association with her cervical spine.  An MRI of the spine is ordered.  She is noted to have mildly elevated D-dimer, probably nonspecific however has had some lower extremity swelling and would recommend lower extremity venous Dopplers.  Echo was performed today and I personally reviewed it which shows normal systolic function and no significant right heart strain.  Formal report to follow.  Monitoring here shows no tachy-arrhythmias.  Ultimately, would recommend outpatient Zio patch monitoring given her recent report of palpitations.  Her beta-blocker has been restarted.  Thanks for the consultation.  For questions or updates, please contact [place] Please consult [email or web address] for contact info under   Pixie Casino, MD, FACC, [place] Director of the [facility]  [facility] Diplomate of the American Board of Clinical Lipidology Attending Cardiologist  Direct Dial: [phone]  Fax: [phone]  Website:  [email or web address]

## 2022-02-04 ENCOUNTER — Observation Stay (HOSPITAL_BASED_OUTPATIENT_CLINIC_OR_DEPARTMENT_OTHER): Payer: Medicaid Other

## 2022-02-04 ENCOUNTER — Encounter (HOSPITAL_COMMUNITY)
Admission: EM | Disposition: A | Discharge status: Home / Self Care | Payer: Self-pay | Source: Home / Self Care | Attending: Emergency Medicine

## 2022-02-04 ENCOUNTER — Encounter (HOSPITAL_COMMUNITY): Payer: Self-pay | Admitting: Internal Medicine

## 2022-02-04 ENCOUNTER — Ambulatory Visit (HOSPITAL_COMMUNITY)
Admit: 2022-02-04 | Discharge: 2022-02-04 | Disposition: A | Discharge status: Home / Self Care | Payer: Medicaid Other | Attending: Internal Medicine | Admitting: Internal Medicine

## 2022-02-04 ENCOUNTER — Encounter (HOSPITAL_COMMUNITY): Payer: Medicaid Other

## 2022-02-04 ENCOUNTER — Observation Stay (HOSPITAL_COMMUNITY): Payer: Medicaid Other | Admitting: Anesthesiology

## 2022-02-04 ENCOUNTER — Observation Stay (HOSPITAL_BASED_OUTPATIENT_CLINIC_OR_DEPARTMENT_OTHER): Payer: Medicaid Other | Admitting: Anesthesiology

## 2022-02-04 DIAGNOSIS — M50122 Cervical disc disorder at C5-C6 level with radiculopathy: Secondary | ICD-10-CM | POA: Diagnosis not present

## 2022-02-04 DIAGNOSIS — I459 Conduction disorder, unspecified: Secondary | ICD-10-CM | POA: Diagnosis not present

## 2022-02-04 DIAGNOSIS — M8448XA Pathological fracture, other site, initial encounter for fracture: Secondary | ICD-10-CM | POA: Diagnosis not present

## 2022-02-04 DIAGNOSIS — E1122 Type 2 diabetes mellitus with diabetic chronic kidney disease: Secondary | ICD-10-CM

## 2022-02-04 DIAGNOSIS — R609 Edema, unspecified: Secondary | ICD-10-CM | POA: Diagnosis not present

## 2022-02-04 DIAGNOSIS — Z794 Long term (current) use of insulin: Secondary | ICD-10-CM | POA: Diagnosis not present

## 2022-02-04 DIAGNOSIS — I129 Hypertensive chronic kidney disease with stage 1 through stage 4 chronic kidney disease, or unspecified chronic kidney disease: Secondary | ICD-10-CM | POA: Diagnosis not present

## 2022-02-04 DIAGNOSIS — N189 Chronic kidney disease, unspecified: Secondary | ICD-10-CM | POA: Diagnosis not present

## 2022-02-04 DIAGNOSIS — D631 Anemia in chronic kidney disease: Secondary | ICD-10-CM | POA: Diagnosis not present

## 2022-02-04 DIAGNOSIS — R55 Syncope and collapse: Secondary | ICD-10-CM | POA: Diagnosis not present

## 2022-02-04 DIAGNOSIS — M542 Cervicalgia: Secondary | ICD-10-CM | POA: Diagnosis not present

## 2022-02-04 DIAGNOSIS — M5412 Radiculopathy, cervical region: Secondary | ICD-10-CM | POA: Diagnosis not present

## 2022-02-04 DIAGNOSIS — N184 Chronic kidney disease, stage 4 (severe): Secondary | ICD-10-CM

## 2022-02-04 DIAGNOSIS — M419 Scoliosis, unspecified: Secondary | ICD-10-CM | POA: Diagnosis not present

## 2022-02-04 DIAGNOSIS — I6381 Other cerebral infarction due to occlusion or stenosis of small artery: Secondary | ICD-10-CM | POA: Diagnosis not present

## 2022-02-04 HISTORY — PX: RADIOLOGY WITH ANESTHESIA: SHX6223

## 2022-02-04 LAB — GLUCOSE, CAPILLARY
Glucose-Capillary: 138 mg/dL — ABNORMAL HIGH (ref 70–99)
Glucose-Capillary: 152 mg/dL — ABNORMAL HIGH (ref 70–99)
Glucose-Capillary: 178 mg/dL — ABNORMAL HIGH (ref 70–99)
Glucose-Capillary: 210 mg/dL — ABNORMAL HIGH (ref 70–99)
Glucose-Capillary: 224 mg/dL — ABNORMAL HIGH (ref 70–99)
Glucose-Capillary: 286 mg/dL — ABNORMAL HIGH (ref 70–99)
Glucose-Capillary: 415 mg/dL — ABNORMAL HIGH (ref 70–99)

## 2022-02-04 SURGERY — MRI WITH ANESTHESIA
Anesthesia: General

## 2022-02-04 MED ORDER — ONDANSETRON HCL 4 MG/2ML IJ SOLN: 4.0000 mg | Freq: Once | INTRAMUSCULAR | Status: DC | PRN

## 2022-02-04 MED ORDER — PHENYLEPHRINE HCL-NACL 20-0.9 MG/250ML-% IV SOLN
INTRAVENOUS | Status: DC | PRN
  Administered 2022-02-04: 40 ug/min via INTRAVENOUS

## 2022-02-04 MED ORDER — SUCCINYLCHOLINE CHLORIDE 200 MG/10ML IV SOSY
PREFILLED_SYRINGE | INTRAVENOUS | Status: DC | PRN
  Administered 2022-02-04: 140 mg via INTRAVENOUS

## 2022-02-04 MED ORDER — MIDAZOLAM HCL 2 MG/2ML IJ SOLN
INTRAMUSCULAR | Status: DC | PRN
  Administered 2022-02-04: 2 mg via INTRAVENOUS

## 2022-02-04 MED ORDER — ORAL CARE MOUTH RINSE: 15.0000 mL | Freq: Once | OROMUCOSAL | Status: AC

## 2022-02-04 MED ORDER — INSULIN ASPART 100 UNIT/ML IJ SOLN: 0.0000 [IU] | INTRAMUSCULAR | Status: DC | PRN

## 2022-02-04 MED ORDER — FUROSEMIDE 10 MG/ML IJ SOLN
20.0000 mg | Freq: Once | INTRAMUSCULAR | Status: AC
  Administered 2022-02-04: 20 mg via INTRAVENOUS
  Filled 2022-02-04: qty 2

## 2022-02-04 MED ORDER — DEXAMETHASONE SODIUM PHOSPHATE 10 MG/ML IJ SOLN
INTRAMUSCULAR | Status: DC | PRN
  Administered 2022-02-04: 10 mg via INTRAVENOUS

## 2022-02-04 MED ORDER — LACTATED RINGERS IV SOLN: INTRAVENOUS | Status: DC

## 2022-02-04 MED ORDER — OXYCODONE HCL 5 MG/5ML PO SOLN: 5.0000 mg | Freq: Once | ORAL | Status: DC | PRN

## 2022-02-04 MED ORDER — SUGAMMADEX SODIUM 200 MG/2ML IV SOLN
INTRAVENOUS | Status: DC | PRN
  Administered 2022-02-04: 400 mg via INTRAVENOUS

## 2022-02-04 MED ORDER — EPHEDRINE SULFATE-NACL 50-0.9 MG/10ML-% IV SOSY
PREFILLED_SYRINGE | INTRAVENOUS | Status: DC | PRN
  Administered 2022-02-04: 10 mg via INTRAVENOUS

## 2022-02-04 MED ORDER — PROPOFOL 10 MG/ML IV BOLUS
INTRAVENOUS | Status: DC | PRN
  Administered 2022-02-04: 160 mg via INTRAVENOUS

## 2022-02-04 MED ORDER — SODIUM CHLORIDE 0.9 % IV SOLN: INTRAVENOUS | Status: DC

## 2022-02-04 MED ORDER — FENTANYL CITRATE (PF) 250 MCG/5ML IJ SOLN
INTRAMUSCULAR | Status: DC | PRN
  Administered 2022-02-04: 100 ug via INTRAVENOUS
  Administered 2022-02-04: 50 ug via INTRAVENOUS
  Administered 2022-02-04: 100 ug via INTRAVENOUS

## 2022-02-04 MED ORDER — CHLORHEXIDINE GLUCONATE 0.12 % MT SOLN
15.0000 mL | Freq: Once | OROMUCOSAL | Status: AC
Start: 2022-02-04 — End: 2022-02-04
  Administered 2022-02-04: 15 mL via OROMUCOSAL

## 2022-02-04 MED ORDER — ROCURONIUM BROMIDE 10 MG/ML (PF) SYRINGE
PREFILLED_SYRINGE | INTRAVENOUS | Status: DC | PRN
  Administered 2022-02-04: 100 mg via INTRAVENOUS

## 2022-02-04 MED ORDER — ONDANSETRON HCL 4 MG/2ML IJ SOLN
INTRAMUSCULAR | Status: DC | PRN
  Administered 2022-02-04: 4 mg via INTRAVENOUS

## 2022-02-04 MED ORDER — OXYCODONE HCL 5 MG PO TABS: 5.0000 mg | ORAL_TABLET | Freq: Once | ORAL | Status: DC | PRN

## 2022-02-04 NOTE — Progress Notes (Signed)
Progress Note  Patient Name: Briana Williams Date of Encounter: 02/04/2022  [facility] Cardiologist: New (Dr. Debara Pickett)    Subjective   No acute overnight events. She denies any recurrent syncope. She reports 2 episodes of palpitations where it felt like her heart was racing and pounding hard. This was associated with chest pain. Episodes lasted no more than 30 seconds. Reviewed telemetry - no concerning arrhythmias seen. No shortness of breath.  [facility] is here to transport patient over to [facility] for brain MRI.   Inpatient Medications    Scheduled Meds:  busPIRone  10 mg Oral TID   carvedilol  12.5 mg Oral BID WC   escitalopram  20 mg Oral Daily   ferrous sulfate  325 mg Oral Q breakfast   gabapentin  400 mg Oral TID   insulin aspart  0-9 Units Subcutaneous Q4H   insulin glargine-yfgn  30 Units Subcutaneous QHS   sodium chloride flush  3 mL Intravenous Q12H   Continuous Infusions:  sodium chloride     PRN Meds: sodium chloride, acetaminophen **OR** acetaminophen, ALPRAZolam, amitriptyline, mouth rinse, oxyCODONE, sodium chloride flush, tiZANidine   Vital Signs    Vitals:   02/03/22 0410 02/03/22 1031 02/03/22 2101 02/04/22 0829  BP: 117/69 (!) 148/81 (!) 142/68 116/61  Pulse: 72 78 74 71  Resp: 16 18 (!) 25   Temp: 98.2 F (36.8 C) 98.2 F (36.8 C) 98.2 F (36.8 C)   TempSrc: Oral Oral Oral   SpO2: 95% 98% 98%   Weight:      Height:        Intake/Output Summary (Last 24 hours) at 02/04/2022 1005 Last data filed at 02/04/2022 0023 Gross per 24 hour  Intake 480 ml  Output 2350 ml  Net -1870 ml      02/02/2022    1:09 PM 12/20/2021   11:46 AM 11/06/2021    8:57 AM  Last 3 Weights  Weight (lbs) 227 lb 230 lb 230 lb  Weight (kg) 102.967 kg 104.327 kg 104.327 kg      Telemetry    No new ECG tracing today. - Personally Reviewed  ECG    Normal sinus rhythm with rates in the 60s to 70s. - Personally Reviewed  Physical Exam   GEN: Morbidly  obese Caucasian female. No acute distress.   Neck: JVD difficult to assess due to body habitus. Cardiac: RRR. No murmurs, rubs, or gallops.  Respiratory: Clear to auscultation anteriorly. No wheezes, rhonchi, or rales. GI: Soft, non-distended, and non-tender. MS: No significant lower extremity edema. No deformity. Skin: Warm and dry. Neuro:  No focal deficits. Psych: Normal affect. Responds appropriately.   Labs    High Sensitivity Troponin:   Recent Labs  Lab 02/02/22 1339 02/02/22 1558  TROPONINIHS 5 5     Chemistry Recent Labs  Lab 02/02/22 1339 02/02/22 1558 02/02/22 2313 02/03/22 0239  NA 135  --  140 143  K 5.5* 4.5 4.0 4.2  CL 105  --  110 114*  CO2 21*  --  22 23  GLUCOSE 174*  --  109* 202*  BUN 40*  --  38* 39*  CREATININE 2.55*  --  2.55* 2.60*  CALCIUM 9.3  --  9.0 9.1  MG  --   --  1.7  --   PROT  --   --  6.8 6.5  ALBUMIN  --   --  3.3* 3.3*  AST  --   --  18 19  ALT  --   --  18 17  ALKPHOS  --   --  48 49  BILITOT  --   --  0.8 0.8  GFRNONAA 24*  --  24* 23*  ANIONGAP 9  --  8 6    Lipids No results for input(s): "CHOL", "TRIG", "HDL", "LABVLDL", "LDLCALC", "CHOLHDL" in the last 168 hours.  Hematology Recent Labs  Lab 02/02/22 1339 02/02/22 2313 02/03/22 0239  WBC 7.1 5.8 6.9  RBC 3.24* 3.29*  3.34* 3.31*  HGB 9.7* 10.1* 10.1*  HCT 30.1* 30.9* 31.6*  MCV 92.9 93.9 95.5  MCH 29.9 30.7 30.5  MCHC 32.2 32.7 32.0  RDW 13.1 13.0 13.0  PLT 145* 152 161   Thyroid  Recent Labs  Lab 02/02/22 2313  TSH 2.713    BNP Recent Labs  Lab 02/02/22 2014  BNP 65.8    DDimer  Recent Labs  Lab 02/02/22 2313  DDIMER 0.77*     Radiology    ECHOCARDIOGRAM COMPLETE  Result Date: 02/03/2022    ECHOCARDIOGRAM REPORT   Patient Name:   Briana Williams Date of Exam: 02/03/2022 Medical Rec #:  [id number]            Height:       55.5 in Accession #:    [id number]           Weight:       227.0 lb Date of Birth:  11-22-1980             BSA:           1.862 m Patient Age:    41 years             BP:           117/69 mmHg Patient Gender: F                    HR:           74 bpm. Exam Location:  Inpatient Procedure: 2D Echo, Cardiac Doppler, Color Doppler and Intracardiac            Opacification Agent Indications:    R55 Syncope  History:        Patient has no prior history of Echocardiogram examinations.                 Risk Factors:GERD, Dyslipidemia, Diabetes, Hypertension and                 Sleep Apnea.  Sonographer:    Merrie Roof RDCS Referring Phys: [place]  Sonographer Comments: Technically difficult study due to poor echo windows and patient is morbidly obese. IMPRESSIONS  1. Left ventricular ejection fraction, by estimation, is 65 to 70%. The left ventricle has normal function. The left ventricle has no regional wall motion abnormalities. Left ventricular diastolic parameters are consistent with Grade I diastolic dysfunction (impaired relaxation).  2. Right ventricular systolic function is normal. The right ventricular size is normal.  3. The mitral valve is normal in structure. No evidence of mitral valve regurgitation. No evidence of mitral stenosis.  4. The aortic valve is normal in structure. Aortic valve regurgitation is not visualized. No aortic stenosis is present.  5. The inferior vena cava is normal in size with greater than 50% respiratory variability, suggesting right atrial pressure of 3 mmHg. FINDINGS  Left Ventricle: Left ventricular ejection fraction, by estimation, is 65 to 70%. The left ventricle has normal function. The left  ventricle has no regional wall motion abnormalities. Definity contrast agent was given IV to delineate the left ventricular  endocardial borders. The left ventricular internal cavity size was normal in size. There is no left ventricular hypertrophy. Left ventricular diastolic parameters are consistent with Grade I diastolic dysfunction (impaired relaxation). Right Ventricle: The right ventricular  size is normal. No increase in right ventricular wall thickness. Right ventricular systolic function is normal. Left Atrium: Left atrial size was normal in size. Right Atrium: Right atrial size was normal in size. Pericardium: There is no evidence of pericardial effusion. Mitral Valve: The mitral valve is normal in structure. No evidence of mitral valve regurgitation. No evidence of mitral valve stenosis. Tricuspid Valve: The tricuspid valve is normal in structure. Tricuspid valve regurgitation is not demonstrated. No evidence of tricuspid stenosis. Aortic Valve: The aortic valve is normal in structure. Aortic valve regurgitation is not visualized. No aortic stenosis is present. Pulmonic Valve: The pulmonic valve was normal in structure. Pulmonic valve regurgitation is not visualized. No evidence of pulmonic stenosis. Aorta: The aortic root is normal in size and structure. Venous: The inferior vena cava is normal in size with greater than 50% respiratory variability, suggesting right atrial pressure of 3 mmHg. IAS/Shunts: No atrial level shunt detected by color flow Doppler.  LEFT VENTRICLE PLAX 2D LVIDd:         4.30 cm   Diastology LVIDs:         2.60 cm   LV e' medial:    5.50 cm/s LV PW:         1.10 cm   LV E/e' medial:  15.0 LV IVS:        0.90 cm   LV e' lateral:   5.98 cm/s LVOT diam:     2.10 cm   LV E/e' lateral: 13.8 LV SV:         72 LV SV Index:   39 LVOT Area:     3.46 cm  RIGHT VENTRICLE RV S prime:     11.10 cm/s TAPSE (M-mode): 1.5 cm LEFT ATRIUM             Index        RIGHT ATRIUM           Index LA diam:        4.30 cm 2.31 cm/m   RA Area:     11.20 cm LA Vol (A2C):   41.2 ml 22.13 ml/m  RA Volume:   23.30 ml  12.51 ml/m LA Vol (A4C):   54.6 ml 29.33 ml/m LA Biplane Vol: 48.1 ml 25.83 ml/m  AORTIC VALVE LVOT Vmax:   87.50 cm/s LVOT Vmean:  56.300 cm/s LVOT VTI:    0.207 m  AORTA Ao Root diam: 3.20 cm Ao Asc diam:  3.30 cm MITRAL VALVE MV Area (PHT): 4.17 cm    SHUNTS MV Decel Time: 182  msec    Systemic VTI:  0.21 m MV E velocity: 82.40 cm/s  Systemic Diam: 2.10 cm MV A velocity: 87.70 cm/s MV E/A ratio:  0.94 Candee Furbish MD Electronically signed by Candee Furbish MD Signature Date/Time: 02/03/2022/12:57:00 PM    Final    DG CHEST PORT 1 VIEW  Result Date: 02/03/2022 CLINICAL DATA:  Syncope EXAM: PORTABLE CHEST 1 VIEW COMPARISON:  12/17/2021 FINDINGS: Low volume chest with interstitial crowding at the bases. There is no edema, consolidation, effusion, or pneumothorax. Borderline heart size. Spinal fusion as previously noted. IMPRESSION: Chronic low volume chest.  No acute finding. Electronically Signed   By: Jorje Guild M.D.   On: 02/03/2022 06:26   CT Thoracic Spine Wo Contrast  Result Date: 02/02/2022 CLINICAL DATA:  Left arm and left leg heaviness after 2 syncopal episodes earlier today. History of cervical fusion. EXAM: CT THORACIC SPINE WITHOUT CONTRAST TECHNIQUE: Multidetector CT images of the thoracic were obtained using the standard protocol without intravenous contrast. RADIATION DOSE REDUCTION: This exam was performed according to the departmental dose-optimization program which includes automated exposure control, adjustment of the mA and/or kV according to patient size and/or use of iterative reconstruction technique. COMPARISON:  Thoracic spine x-rays dated Dec 09, 2020. MRI thoracic spine dated January 04, 2019. FINDINGS: Alignment: Unchanged dextroscoliosis of the midthoracic spine and levoscoliosis of the upper lumbar spine. No listhesis. Vertebrae: No acute fracture or focal pathologic process. Paraspinal and other soft tissues: Punctate bilateral renal calculi. Prior cholecystectomy. Disc levels: Prior posterior cervicothoracic fusion to T3. Diffuse solid osseous fusion of the thoracic vertebral bodies and posterior elements. Peritoneal right remnant again seen on the right at the thoracolumbar junction. IMPRESSION: 1. No acute osseous abnormality. 2. Solid osseous fusion of the  thoracic spine. 3. Punctate bilateral nephrolithiasis. Electronically Signed   By: Titus Dubin M.D.   On: 02/02/2022 16:27   DG Shoulder Left  Result Date: 02/02/2022 CLINICAL DATA:  Left shoulder pain EXAM: LEFT SHOULDER - 2+ VIEW COMPARISON:  None Available. FINDINGS: There is no evidence of fracture or dislocation. There is no evidence of arthropathy or other focal bone abnormality. Partially imaged cervicothoracic fusion hardware. Soft tissues are unremarkable. IMPRESSION: No fracture or dislocation of the left shoulder. Joint spaces are preserved. Electronically Signed   By: Delanna Ahmadi M.D.   On: 02/02/2022 15:39   CT Head Wo Contrast  Result Date: 02/02/2022 CLINICAL DATA:  Neck trauma, midline tenderness (Age 65-64y); Head trauma, moderate-severe EXAM: CT HEAD WITHOUT CONTRAST CT CERVICAL SPINE WITHOUT CONTRAST TECHNIQUE: Multidetector CT imaging of the head and cervical spine was performed following the standard protocol without intravenous contrast. Multiplanar CT image reconstructions of the cervical spine were also generated. RADIATION DOSE REDUCTION: This exam was performed according to the departmental dose-optimization program which includes automated exposure control, adjustment of the mA and/or kV according to patient size and/or use of iterative reconstruction technique. COMPARISON:  CT cervical spine 04/27/2019. FINDINGS: CT HEAD FINDINGS Brain: No evidence of acute infarction, hemorrhage, hydrocephalus, extra-axial collection or mass lesion/mass effect. Vascular: No hyperdense vessel identified. Skull: No acute fracture. Sinuses/Orbits: Clear visualized sinuses. No acute orbital findings. Other: No mastoid effusions. CT CERVICAL SPINE FINDINGS Alignment: No substantial sagittal subluxation. Levocurvature of the lower cervical spine. Skull base and vertebrae: Posterior fusion from C2-C3 and anterior fusion from C3-C6. Extensive bony fusion throughout the cervical and upper thoracic  spine. Streak artifact from hardware limits assessment. No evidence of acute fracture. Remote/healed dens fracture. Soft tissues and spinal canal: No prevertebral fluid or swelling. No visible canal hematoma. Disc levels: Multilevel degenerative change including facet arthropathy and endplate spurring. Varying degrees of neural foraminal stenosis. Extensive posterior decompression. Upper chest: Visualized lung apices are clear. IMPRESSION: 1. No evidence of acute intracranial abnormality. 2. Remote dens fracture without change in alignment. 3. No evidence of acute fracture or traumatic malalignment. 4. Posterior fusion from C2-C3 and anterior fusion from C3-C6. 5. Extensive bony fusion throughout the cervical and upper thoracic spine. Electronically Signed   By: Margaretha Sheffield M.D.   On: 02/02/2022 15:22   CT Cervical  Spine Wo Contrast  Result Date: 02/02/2022 CLINICAL DATA:  Neck trauma, midline tenderness (Age 94-64y); Head trauma, moderate-severe EXAM: CT HEAD WITHOUT CONTRAST CT CERVICAL SPINE WITHOUT CONTRAST TECHNIQUE: Multidetector CT imaging of the head and cervical spine was performed following the standard protocol without intravenous contrast. Multiplanar CT image reconstructions of the cervical spine were also generated. RADIATION DOSE REDUCTION: This exam was performed according to the departmental dose-optimization program which includes automated exposure control, adjustment of the mA and/or kV according to patient size and/or use of iterative reconstruction technique. COMPARISON:  CT cervical spine 04/27/2019. FINDINGS: CT HEAD FINDINGS Brain: No evidence of acute infarction, hemorrhage, hydrocephalus, extra-axial collection or mass lesion/mass effect. Vascular: No hyperdense vessel identified. Skull: No acute fracture. Sinuses/Orbits: Clear visualized sinuses. No acute orbital findings. Other: No mastoid effusions. CT CERVICAL SPINE FINDINGS Alignment: No substantial sagittal subluxation.  Levocurvature of the lower cervical spine. Skull base and vertebrae: Posterior fusion from C2-C3 and anterior fusion from C3-C6. Extensive bony fusion throughout the cervical and upper thoracic spine. Streak artifact from hardware limits assessment. No evidence of acute fracture. Remote/healed dens fracture. Soft tissues and spinal canal: No prevertebral fluid or swelling. No visible canal hematoma. Disc levels: Multilevel degenerative change including facet arthropathy and endplate spurring. Varying degrees of neural foraminal stenosis. Extensive posterior decompression. Upper chest: Visualized lung apices are clear. IMPRESSION: 1. No evidence of acute intracranial abnormality. 2. Remote dens fracture without change in alignment. 3. No evidence of acute fracture or traumatic malalignment. 4. Posterior fusion from C2-C3 and anterior fusion from C3-C6. 5. Extensive bony fusion throughout the cervical and upper thoracic spine. Electronically Signed   By: Margaretha Sheffield M.D.   On: 02/02/2022 15:22    Cardiac Studies   Echocardiogram 02/03/2022: Impressions:  1. Left ventricular ejection fraction, by estimation, is 65 to 70%. The  left ventricle has normal function. The left ventricle has no regional  wall motion abnormalities. Left ventricular diastolic parameters are  consistent with Grade I diastolic  dysfunction (impaired relaxation).   2. Right ventricular systolic function is normal. The right ventricular  size is normal.   3. The mitral valve is normal in structure. No evidence of mitral valve  regurgitation. No evidence of mitral stenosis.   4. The aortic valve is normal in structure. Aortic valve regurgitation is  not visualized. No aortic stenosis is present.   5. The inferior vena cava is normal in size with greater than 50%  respiratory variability, suggesting right atrial pressure of 3 mmHg.   Patient Profile     41 y.o. female with a history of hypertension, hyperlipidemia, type 2  diabetes mellitus, CKD stage IV, obstructive sleep apnea, kyphoscoliosis, chronic pain syndrome, morbid obesity, and depression who was admitted on 02/02/2022 for syncope.  Assessment & Plan    Syncope Patient has a history of recurrent syncope. She reports 10-15 episodes more than 4 years ago prior to last neck surgery. She had not had any recent issues with this until day of presentation when she had 2 syncopal episodes without any prodrome. EKG showed normal sinus rhythm with no acute ischemic changes. High-sensitivity troponin negative x2. Echo showed LVEF of 65-70% with grade 1 diastolic dysfunction and no significant valvular disease. - No recurrent syncope. She does reports 2 episodes of palpitations with associated chest pain that lasted no more than 30 seconds. Reviewed telemetry. She is in normal sinus rhythm with rates in the 60s to 70s. No concerns pauses or tachyarrhythmias.  - D-dimer elevated  at 0.77. No RV dysfunction on Echo to suggest PE. Unfortunately unable to get chest CTA due to renal function. She has had some lower extremity edema. Lower extremity dopplers performed this morning. Results pending. - Carotid dopplers pending. - Patient is about to head for brain MRI at [facility]. - Will need outpatient monitor at discharge.  Hypertension BP well controlled this morning. - Continue home Coreg 12.5mg  twice daily.  - Home Hydralazine currently on hold.  CKD Stage IV Creatinine 2.55 on admission and 2.6 yesterday which seems to be around patient's baseline.  Otherwise, per primary team: - Cervical myelopathy - Kyphoscoliosis - Chronic pain - Hyperlipidemia - Type 2 diabetes    For questions or updates, please contact [place] Please consult [email or web address] for contact info under        Signed, Darreld Mclean, PA-C  02/04/2022, 10:05 AM

## 2022-02-04 NOTE — Progress Notes (Signed)
PROGRESS NOTE  Briana Williams ION:[id number] DOB: Sep 17, 1980 DOA: 02/02/2022 PCP: Ladell Pier, MD   LOS: 0 days   Brief Narrative / Interim history: 41 year old female with CKD stage IV, DM 2, HTN, HLD, chronic pain, severe scoliosis status post 40+ surgeries, progressive cervical myelopathy comes into the hospital with syncope x2.  She was walking her dog and apparently passed out without any warning or prodromal signs.  She reports pain in her head and neck, as well as more recently has had increased left-sided weakness.  She also reports lower extremity swelling for the past 3 weeks.  Also, her husband died recently and she is under a lot of stress.  She has started BuSpar as well as hydroxyzine in the last 3 weeks.  Subjective / 24h Interval events: Awaiting MRI this morning  Assesement and Plan: Principal Problem:   Syncope Active Problems:   Cervical myelopathy (HCC)   Chronic pain syndrome   DM (diabetes mellitus), type 2 with renal complications (HCC)   CKD (chronic kidney disease), stage IV (HCC)   Anemia in chronic kidney disease   Essential hypertension, benign   Depression   Morbid obesity (HCC)   Bilateral leg edema  Principal problem Syncope-unclear etiology, but concern for cardiac more so given lack of prodromal symptoms.  Cardiology consulted and following patient while hospitalized.  She underwent a 2D echo which showed normal EF at 65 to 24%, grade 1 diastolic dysfunction, normal RV.  Carotids without significant stenosis.  They recommended monitor at discharge  Active problems Cervical myelopathy, chronic pain, severe scoliosis, progressive left-sided weakness-due to progressive left-sided weakness we will do an MRI of the brain as well as C-spine.  Left-sided weakness is persistent today.  MRI with general anesthesia was done really late today, read is not available yet.  Her ride is at least 1-1/2 hours away and will not be able to pick her up tonight  even if the MRI is read later all  CKD stage IV-creatinine appears at baseline, she is ranging anywhere between 2.5-low threes.  Avoid nephrotoxins  Morbid obesity-BMI 51, she would benefit from weight loss  Essential hypertension -resume home Coreg, hold hydralazine  Leg swelling, positive D-dimer-obtain lower extremity Dopplers to rule out DVT.  Unable to obtain CT angiogram due to renal failure, but PE is less likely without chest pain, tachycardia, normal RV  DM-controlled, last A1c 6.8.  Continue Lantus and sliding scale.  CBGs unremarkable  CBG (last 3)  Recent Labs    02/04/22 0731 02/04/22 1058 02/04/22 1544  GLUCAP 210* 138* 152*    Lab Results  Component Value Date   HGBA1C 6.8 (H) 02/02/2022     Scheduled Meds:  busPIRone  10 mg Oral TID   carvedilol  12.5 mg Oral BID WC   escitalopram  20 mg Oral Daily   ferrous sulfate  325 mg Oral Q breakfast   gabapentin  400 mg Oral TID   insulin aspart  0-9 Units Subcutaneous Q4H   insulin glargine-yfgn  30 Units Subcutaneous QHS   sodium chloride flush  3 mL Intravenous Q12H   Continuous Infusions:  sodium chloride     sodium chloride 10 mL/hr at 02/04/22 1115   PRN Meds:.sodium chloride, acetaminophen **OR** acetaminophen, ALPRAZolam, amitriptyline, mouth rinse, oxyCODONE, sodium chloride flush, tiZANidine  Diet Orders (From admission, onward)     Start     Ordered   02/03/22 0735  Diet regular Room service appropriate? Yes; Fluid consistency: Thin  Diet  effective now       Question Answer Comment  Room service appropriate? Yes   Fluid consistency: Thin      02/03/22 0734            DVT prophylaxis: SCDs Start: 02/02/22 2311   Lab Results  Component Value Date   PLT 161 02/03/2022      Code Status: DNR  Family Communication: No family at bedside  Status is: Observation  Level of care: Telemetry  Consultants:  Cardiology   Procedures:  2D echo: pending  Objective: Vitals:   02/04/22  1545 02/04/22 1600 02/04/22 1615 02/04/22 1705  BP: (!) 142/88 138/85 (!) 142/79 (!) 155/85  Pulse: 68 66 65 66  Resp: 15 15 14 18   Temp:   97.7 F (36.5 C) 98.4 F (36.9 C)  TempSrc:    Oral  SpO2: 92% 94% 92% 95%  Weight:      Height:        Intake/Output Summary (Last 24 hours) at 02/04/2022 1738 Last data filed at 02/04/2022 0830 Gross per 24 hour  Intake 480 ml  Output 2200 ml  Net -1720 ml    Wt Readings from Last 3 Encounters:  02/04/22 103 kg  12/20/21 104.3 kg  11/06/21 104.3 kg    Examination:  Constitutional: NAD Eyes: lids and conjunctivae normal, no scleral icterus ENMT: mmm Neck: normal, supple Respiratory: clear to auscultation bilaterally, no wheezing, no crackles. Normal respiratory effort.  Cardiovascular: Regular rate and rhythm, no murmurs / rubs / gallops.  1+ edema Abdomen: soft, no distention, no tenderness. Bowel sounds positive.  Skin: no rashes Neurologic: no focal deficits, equal strength  Data Reviewed: I have independently reviewed following labs and imaging studies   CBC Recent Labs  Lab 02/02/22 1339 02/02/22 2313 02/03/22 0239  WBC 7.1 5.8 6.9  HGB 9.7* 10.1* 10.1*  HCT 30.1* 30.9* 31.6*  PLT 145* 152 161  MCV 92.9 93.9 95.5  MCH 29.9 30.7 30.5  MCHC 32.2 32.7 32.0  RDW 13.1 13.0 13.0  LYMPHSABS  --  2.0  --   MONOABS  --  0.4  --   EOSABS  --  0.2  --   BASOSABS  --  0.0  --      Recent Labs  Lab 02/02/22 1339 02/02/22 1558 02/02/22 2014 02/02/22 2313 02/03/22 0239  NA 135  --   --  140 143  K 5.5* 4.5  --  4.0 4.2  CL 105  --   --  110 114*  CO2 21*  --   --  22 23  GLUCOSE 174*  --   --  109* 202*  BUN 40*  --   --  38* 39*  CREATININE 2.55*  --   --  2.55* 2.60*  CALCIUM 9.3  --   --  9.0 9.1  AST  --   --   --  18 19  ALT  --   --   --  18 17  ALKPHOS  --   --   --  48 49  BILITOT  --   --   --  0.8 0.8  ALBUMIN  --   --   --  3.3* 3.3*  MG  --   --   --  1.7  --   DDIMER  --   --   --  0.77*  --    LATICACIDVEN  --   --   --  0.6 1.0  INR  --   --   --  0.9  --   TSH  --   --   --  2.713  --   HGBA1C  --   --   --  6.8*  --   BNP  --   --  65.8  --   --      ------------------------------------------------------------------------------------------------------------------ No results for input(s): "CHOL", "HDL", "LDLCALC", "TRIG", "CHOLHDL", "LDLDIRECT" in the last 72 hours.  Lab Results  Component Value Date   HGBA1C 6.8 (H) 02/02/2022   ------------------------------------------------------------------------------------------------------------------ Recent Labs    02/02/22 2313  TSH 2.713     Cardiac Enzymes No results for input(s): "CKMB", "TROPONINI", "MYOGLOBIN" in the last 168 hours.  Invalid input(s): "CK" ------------------------------------------------------------------------------------------------------------------    Component Value Date/Time   BNP 65.8 02/02/2022 2014    CBG: Recent Labs  Lab 02/04/22 0002 02/04/22 0416 02/04/22 0731 02/04/22 1058 02/04/22 1544  GLUCAP 178* 224* 210* 138* 152*     No results found for this or any previous visit (from the past 240 hour(s)).   Radiology Studies: VAS [name] LOWER EXTREMITY VENOUS (DVT)  Result Date: 02/04/2022  Lower Venous DVT Study Patient Name:  TASHAWNDA BLEILER [facility]  Date of Exam:   02/04/2022 Medical Rec #: [id number]             Accession #:    [id number] Date of Birth: [birth year]              Patient Gender: F Patient Age:   20 years Exam Location:  [facility] Procedure:      VAS [name] LOWER EXTREMITY VENOUS (DVT) Referring Phys: Nyoka Lint DOUTOVA --------------------------------------------------------------------------------  Indications: Edema.  Limitations: Body habitus and poor ultrasound/tissue interface. Comparison Study: Previous exam on 07/11/18 was negative for DVT Performing Technologist: Rogelia Rohrer RVT, RDMS  Examination Guidelines: A complete evaluation includes B-mode imaging,  spectral Doppler, color Doppler, and power Doppler as needed of all accessible portions of each vessel. Bilateral testing is considered an integral part of a complete examination. Limited examinations for reoccurring indications may be performed as noted. The reflux portion of the exam is performed with the patient in reverse Trendelenburg.  +---------+---------------+---------+-----------+---------------+--------------+ RIGHT    CompressibilityPhasicitySpontaneityProperties     Thrombus Aging +---------+---------------+---------+-----------+---------------+--------------+ CFV      Full           Yes      Yes                                      +---------+---------------+---------+-----------+---------------+--------------+ SFJ      Full                                                             +---------+---------------+---------+-----------+---------------+--------------+ FV Prox  Full           Yes      Yes                                      +---------+---------------+---------+-----------+---------------+--------------+ FV Mid                  Yes      Yes  patent by                                                                 color/doppler                 +---------+---------------+---------+-----------+---------------+--------------+ FV Distal               Yes      Yes        patent by                                                                 color/doppler                 +---------+---------------+---------+-----------+---------------+--------------+ PFV      Full                                                             +---------+---------------+---------+-----------+---------------+--------------+ POP      Full           Yes      Yes                                      +---------+---------------+---------+-----------+---------------+--------------+ PTV                                                         Not well                                                                  visualized     +---------+---------------+---------+-----------+---------------+--------------+ PERO                                                       Not well                                                                  visualized     +---------+---------------+---------+-----------+---------------+--------------+   +---------+---------------+---------+-----------+----------+--------------+ LEFT     CompressibilityPhasicitySpontaneityPropertiesThrombus Aging +---------+---------------+---------+-----------+----------+--------------+ CFV  Full           Yes      Yes                                 +---------+---------------+---------+-----------+----------+--------------+ SFJ      Full                                                        +---------+---------------+---------+-----------+----------+--------------+ FV Prox  Full           Yes      Yes                                 +---------+---------------+---------+-----------+----------+--------------+ FV Mid                  Yes      Yes                                 +---------+---------------+---------+-----------+----------+--------------+ FV DistalFull           Yes      Yes                                 +---------+---------------+---------+-----------+----------+--------------+ PFV      Full                                                        +---------+---------------+---------+-----------+----------+--------------+ POP      Full           Yes      Yes                                 +---------+---------------+---------+-----------+----------+--------------+ PTV      Full                                                        +---------+---------------+---------+-----------+----------+--------------+ PERO     Full                                                         +---------+---------------+---------+-----------+----------+--------------+     Summary: BILATERAL: - No evidence of deep vein thrombosis seen in the lower extremities, bilaterally. -No evidence of popliteal cyst, bilaterally. -Subcutaneous edema, bilaterally (calves). R > L   *See table(s) above for measurements and observations. Electronically signed by Monica Martinez MD on 02/04/2022 at 1:52:57 PM.    Final    VAS US CAROTID  Result Date: 02/04/2022 Carotid Arterial Duplex Study Patient Name:  DARSHANA CURNUTT  [facility]  Date of Exam:   02/04/2022 Medical Rec #: [id number]             Accession #:    [id number] Date of Birth: 04-Mar-1981              Patient Gender: F Patient Age:   27 years Exam Location:  [facility] Procedure:      VAS US CAROTID Referring Phys: Nyoka Lint DOUTOVA --------------------------------------------------------------------------------  Indications:   Syncope. Risk Factors:  Hypertension, hyperlipidemia, Diabetes, no history of smoking. Other Factors: CKD. Limitations    Today's exam was limited due to the body habitus of the patient                and limited mobility of neck due to several prior surgeries. Performing Technologist: Rogelia Rohrer RVT, RDMS  Examination Guidelines: A complete evaluation includes B-mode imaging, spectral Doppler, color Doppler, and power Doppler as needed of all accessible portions of each vessel. Bilateral testing is considered an integral part of a complete examination. Limited examinations for reoccurring indications may be performed as noted.  Right Carotid Findings: +----------+--------+--------+--------+------------------+--------+           PSV cm/sEDV cm/sStenosisPlaque DescriptionComments +----------+--------+--------+--------+------------------+--------+ CCA Prox  88      21                                         +----------+--------+--------+--------+------------------+--------+ CCA Distal69      18                                          +----------+--------+--------+--------+------------------+--------+ ICA Prox  62      24                                         +----------+--------+--------+--------+------------------+--------+ ICA Distal112     49                                         +----------+--------+--------+--------+------------------+--------+ ECA       108     15                                         +----------+--------+--------+--------+------------------+--------+ +----------+--------+-------+----------------+-------------------+           PSV cm/sEDV cmsDescribe        Arm Pressure (mmHG) +----------+--------+-------+----------------+-------------------+ [id number]            Multiphasic, WNL                    +----------+--------+-------+----------------+-------------------+ +---------+--------+--+--------+--+---------+ VertebralPSV cm/s43EDV cm/s14Antegrade +---------+--------+--+--------+--+---------+  Left Carotid Findings: +----------+--------+--------+--------+------------------+--------+           PSV cm/sEDV cm/sStenosisPlaque DescriptionComments +----------+--------+--------+--------+------------------+--------+ CCA Prox  91      21                                         +----------+--------+--------+--------+------------------+--------+ CCA Distal76  21                                         +----------+--------+--------+--------+------------------+--------+ ICA Prox  65      20                                         +----------+--------+--------+--------+------------------+--------+ ICA Distal88      34                                         +----------+--------+--------+--------+------------------+--------+ ECA       98      18                                         +----------+--------+--------+--------+------------------+--------+ +----------+--------+--------+----------------+-------------------+            PSV cm/sEDV cm/sDescribe        Arm Pressure (mmHG) +----------+--------+--------+----------------+-------------------+ [id number]             Multiphasic, WNL                    +----------+--------+--------+----------------+-------------------+ +---------+--------+--------+--------------+ VertebralPSV cm/sEDV cm/sNot identified +---------+--------+--------+--------------+   Summary: Right Carotid: The extracranial vessels were near-normal with only minimal wall                thickening or plaque. Left Carotid: The extracranial vessels were near-normal with only minimal wall               thickening or plaque. Vertebrals:  Right vertebral artery demonstrates antegrade flow. Left vertebral              artery was not visualized. Subclavians: Normal flow hemodynamics were seen in bilateral subclavian              arteries. *See table(s) above for measurements and observations.  Electronically signed by Monica Martinez MD on 02/04/2022 at 1:45:13 PM.    Final      Marzetta Board, MD, PhD [facility]  Between 7 am - 7 pm I am available, please contact me via Amion (for emergencies) or Securechat (non urgent messages)  Between 7 pm - 7 am I am not available, please contact night coverage MD/APP via Amion

## 2022-02-04 NOTE — Transfer of Care (Signed)
Immediate Anesthesia Transfer of Care Note  Patient: Briana Williams  Procedure(s) Performed: MRI WITH ANESTHESIA  Patient Location: PACU  Anesthesia Type:General  Level of Consciousness: drowsy  Airway & Oxygen Therapy: Patient Spontanous Breathing  Post-op Assessment: Report given to RN and Post -op Vital signs reviewed and stable  Post vital signs: Reviewed and stable  Last Vitals:  Vitals Value Taken Time  BP 142/88 02/04/22 1545  Temp    Pulse 66 02/04/22 1548  Resp 14 02/04/22 1548  SpO2 94 % 02/04/22 1548  Vitals shown include unvalidated device data.  Last Pain:  Vitals:   02/04/22 1530  TempSrc:   PainSc: 0-No pain      Patients Stated Pain Goal: 3 (92/42/68 3419)  Complications: No notable events documented.

## 2022-02-04 NOTE — Inpatient Diabetes Management (Addendum)
Inpatient Diabetes Program Recommendations  AACE/ADA: New Consensus Statement on Inpatient Glycemic Control (2015)  Target Ranges:  Prepandial:   less than 140 mg/dL      Peak postprandial:   less than 180 mg/dL (1-2 hours)      Critically ill patients:  140 - 180 mg/dL   Lab Results  Component Value Date   GLUCAP 210 (H) 02/04/2022   HGBA1C 6.8 (H) 02/02/2022    Review of Glycemic Control  Latest Reference Range & Units 02/04/22 00:02 02/04/22 04:16 02/04/22 07:31  Glucose-Capillary 70 - 99 mg/dL 178 (H) 224 (H) 210 (H)  (H): Data is abnormally high Diabetes history: Type 2 DM Outpatient Diabetes medications: Novolog 15 units BID, Lantus 34-38 units QD, Januvia 25 mg QD (NT) Current orders for Inpatient glycemic control: Semglee 30 units QHS, Novolog 0-9 units Q4H Decadron 10 mg x 1  Inpatient Diabetes Program Recommendations:    Consider increasing Semglee to 35 units QHS and changing diet to carb modified.  Addendum: Attempted to see patient on two different attempts. Patient transferred to [facility] for MRI. Will reattempt 7/11.  Thanks, Bronson Curb, MSN, RNC-OB Diabetes Coordinator [phone] (8a-5p)

## 2022-02-04 NOTE — Progress Notes (Signed)
BLE venous duplex and carotid duplex have been completed.   Results can be found under chart review under CV PROC. 02/04/2022 12:27 PM Kenderick Kobler RVT, RDMS

## 2022-02-04 NOTE — Interval H&P Note (Signed)
Anesthesia H&P Update: History and Physical Exam reviewed; patient is OK for planned anesthetic and procedure. ? ?

## 2022-02-04 NOTE — Anesthesia Preprocedure Evaluation (Addendum)
Anesthesia Evaluation  Patient identified by MRN, date of birth, ID band Patient awake    Reviewed: Allergy & Precautions, NPO status , Patient's Chart, lab work & pertinent test results  History of Anesthesia Complications Negative for: history of anesthetic complications  Airway Mallampati: II  TM Distance: >3 FB Neck ROM: Limited    Dental  (+) Dental Advisory Given, Teeth Intact   Pulmonary sleep apnea ,    Pulmonary exam normal        Cardiovascular hypertension, Pt. on home beta blockers and Pt. on medications Normal cardiovascular exam   '23 TTE - EF 65 to 70%. Grade I diastolic dysfunction (impaired relaxation).     Neuro/Psych  Headaches, PSYCHIATRIC DISORDERS Depression    GI/Hepatic GERD  ,  Endo/Other  diabetes, Type 2, Insulin DependentMorbid obesity  Renal/GU CRFRenal disease     Musculoskeletal  (+) Arthritis , narcotic dependent Scoliosis    Abdominal   Peds  Hematology  (+) Blood dyscrasia, anemia ,   Anesthesia Other Findings Recurrent syncopal episodes, being followed by cardiology   Reproductive/Obstetrics                            Anesthesia Physical Anesthesia Plan  ASA: 3  Anesthesia Plan: General   Post-op Pain Management: Minimal or no pain anticipated   Induction: Intravenous  PONV Risk Score and Plan: 3 and Treatment may vary due to age or medical condition, Ondansetron, Dexamethasone and Midazolam  Airway Management Planned: Oral ETT and Video Laryngoscope Planned  Additional Equipment: None  Intra-op Plan:   Post-operative Plan: Extubation in OR  Informed Consent: I have reviewed the patients History and Physical, chart, labs and discussed the procedure including the risks, benefits and alternatives for the proposed anesthesia with the patient or authorized representative who has indicated his/her understanding and acceptance.     Dental  advisory given  Plan Discussed with: CRNA and Anesthesiologist  Anesthesia Plan Comments:       Anesthesia Quick Evaluation

## 2022-02-04 NOTE — Anesthesia Procedure Notes (Signed)
Procedure Name: Intubation Date/Time: 02/04/2022 2:06 PM  Performed by: Moshe Salisbury, CRNAPre-anesthesia Checklist: Patient identified, Emergency Drugs available, Suction available and Patient being monitored Patient Re-evaluated:Patient Re-evaluated prior to induction Oxygen Delivery Method: Circle System Utilized Preoxygenation: Pre-oxygenation with 100% oxygen Induction Type: IV induction Ventilation: Two handed mask ventilation required Laryngoscope Size: 3 and Glidescope Grade View: Grade I Tube type: Oral Tube size: 7.0 mm Number of attempts: 1 Airway Equipment and Method: Stylet and Oral airway Placement Confirmation: ETT inserted through vocal cords under direct vision, positive ETCO2 and breath sounds checked- equal and bilateral Secured at: 22 cm Tube secured with: Tape Dental Injury: Teeth and Oropharynx as per pre-operative assessment  Comments: Lauren Cozart, SRNA placed ETT under supervision.

## 2022-02-05 ENCOUNTER — Encounter (HOSPITAL_COMMUNITY): Payer: Self-pay | Admitting: Radiology

## 2022-02-05 DIAGNOSIS — I459 Conduction disorder, unspecified: Secondary | ICD-10-CM | POA: Diagnosis not present

## 2022-02-05 DIAGNOSIS — R55 Syncope and collapse: Secondary | ICD-10-CM | POA: Diagnosis not present

## 2022-02-05 LAB — GLUCOSE, CAPILLARY
Glucose-Capillary: 284 mg/dL — ABNORMAL HIGH (ref 70–99)
Glucose-Capillary: 435 mg/dL — ABNORMAL HIGH (ref 70–99)
Glucose-Capillary: 452 mg/dL — ABNORMAL HIGH (ref 70–99)

## 2022-02-05 MED ORDER — INSULIN ASPART 100 UNIT/ML IJ SOLN
10.0000 [IU] | Freq: Once | INTRAMUSCULAR | Status: AC
  Administered 2022-02-05: 10 [IU] via SUBCUTANEOUS

## 2022-02-05 MED ORDER — CALCIUM CARBONATE ANTACID 500 MG PO CHEW
1.0000 | CHEWABLE_TABLET | ORAL | Status: DC | PRN
  Administered 2022-02-05: 200 mg via ORAL
  Filled 2022-02-05: qty 1

## 2022-02-05 NOTE — Anesthesia Postprocedure Evaluation (Signed)
Anesthesia Post Note  Patient: Briana Williams  Procedure(s) Performed: MRI WITH ANESTHESIA     Patient location during evaluation: PACU Anesthesia Type: General Level of consciousness: awake and alert Pain management: pain level controlled Vital Signs Assessment: post-procedure vital signs reviewed and stable Respiratory status: spontaneous breathing, nonlabored ventilation, respiratory function stable and patient connected to nasal cannula oxygen Cardiovascular status: blood pressure returned to baseline and stable Postop Assessment: no apparent nausea or vomiting Anesthetic complications: no   No notable events documented.  Last Vitals:  Vitals:   02/04/22 1942 02/05/22 0612  BP: 139/71 (!) 154/93  Pulse: 71 90  Resp: 18 17  Temp: 36.9 C 36.8 C  SpO2: 99% 93%    Last Pain:  Vitals:   02/05/22 0847  TempSrc:   PainSc: [place]

## 2022-02-05 NOTE — Inpatient Diabetes Management (Signed)
Inpatient Diabetes Program Recommendations  AACE/ADA: New Consensus Statement on Inpatient Glycemic Control (2015)  Target Ranges:  Prepandial:   less than 140 mg/dL      Peak postprandial:   less than 180 mg/dL (1-2 hours)      Critically ill patients:  140 - 180 mg/dL   Lab Results  Component Value Date   GLUCAP 284 (H) 02/05/2022   HGBA1C 6.8 (H) 02/02/2022    Review of Glycemic Control  Latest Reference Range & Units 02/04/22 23:36 02/05/22 00:21 02/05/22 03:06 02/05/22 07:26  Glucose-Capillary 70 - 99 mg/dL 415 (H) 452 (H) 435 (H) 284 (H)  (H): Data is abnormally high Diabetes history: Type 2 DM Outpatient Diabetes medications: Novolog 15 units BID, Lantus 34-38 units QD, Januvia 25 mg QD (NT) Current orders for Inpatient glycemic control: Semglee 30 units QHS, Novolog 0-9 units Q4H Decadron 10 mg x 1   Inpatient Diabetes Program Recommendations:     Spoke with patient regarding outpatient diabetes management. Denies having hypoglycemic episodes at home and has not been taking insulin consistently since husbands recent death.  Reviewed patient's current A1c of 6.8%. Explained what a A1c is and what it measures. Also reviewed goal A1c with patient, importance of good glucose control @ home, and blood sugar goals. Reviewed patho of DM, role of pancreas, survival skills, GLPs benefits, interventions, impact of steroids to glucose trends, vascular changes and commorbidities.  Patient has meter and testing supplies. Reviewed recommended frequency and discussed when to call MD. Patient planning to follow up with PCP.  Denies drinking sugary beverages and works towards [employer]. Encouraged to continue and reviewed plate method.  Reviewed glucose trends and importance of taking insulin as prescribed. Encouraged to have A1C repeated in next 3 months for accuracy. No further questions at this time.   Thanks, Bronson Curb, MSN, RNC-OB Diabetes Coordinator [phone]  (8a-5p)

## 2022-02-05 NOTE — TOC Transition Note (Signed)
Transition of Care [facility]) - CM/SW Discharge Note   Patient Details  Name: Briana Williams MRN: [id number] Date of Birth: 1981-06-04  [facility]) CM/SW Contact:  Leeroy Cha, RN Phone Number: 02/05/2022, 11:24 AM   Clinical Narrative:    Patient dcd to home with no toc needs.     Barriers to Discharge: Continued Medical Work up   Patient Goals and CMS Choice Patient states their goals for this hospitalization and ongoing recovery are:: to go home CMS Medicare.gov Compare Post Acute Care list provided to:: Patient    Discharge Placement                       Discharge Plan and Services   Discharge Planning Services: CM Consult                                 Social Determinants of Health (SDOH) Interventions     Readmission Risk Interventions     No data to display

## 2022-02-05 NOTE — Progress Notes (Signed)
CBG check was 415 at 2336, on-call notified. Recheck CBG was 452 at 0021. Orders given. On-call updated about CBG 435 taken at 0306. New orders given. Pt states she is under a lot of stress and had eaten peanut butter and jelly sandwich around 2300

## 2022-02-05 NOTE — Progress Notes (Signed)
   Note plans for d/c today - will arrange for 2 week outpatient monitor and follow-up.  Pixie Casino, MD, [facility], [place] Director of the Advanced Lipid Disorders &  Cardiovascular Risk Reduction Clinic Diplomate of the American Board of Clinical Lipidology Attending Cardiologist  Direct Dial: [phone]  Fax: [phone]  Website:  [email or web address]

## 2022-02-05 NOTE — TOC Initial Note (Signed)
Transition of Care [facility]) - Initial/Assessment Note    Patient Details  Name: Briana Williams MRN: [id number] Date of Birth: 15-Oct-1980  [facility]) CM/SW Contact:    Leeroy Cha, RN Phone Number: 02/05/2022, 8:24 AM  Clinical Narrative:                  Transition of Care [facility]) Screening Note   Patient Details  Name: Briana Williams Date of Birth: [birth year]   Transition of Care [facility]) CM/SW Contact:    Leeroy Cha, RN Phone Number: 02/05/2022, 8:24 AM    Transition of Care Department (TOC) has reviewed patient and no TOC needs have been identified at this time. We will continue to monitor patient advancement through interdisciplinary progression rounds. If new patient transition needs arise, please place a TOC consult.    Expected Discharge Plan: Home/Self Care Barriers to Discharge: Continued Medical Work up   Patient Goals and CMS Choice Patient states their goals for this hospitalization and ongoing recovery are:: to go home CMS Medicare.gov Compare Post Acute Care list provided to:: Patient    Expected Discharge Plan and Services Expected Discharge Plan: Home/Self Care   Discharge Planning Services: CM Consult   Living arrangements for the past 2 months: Single Family Home                                      Prior Living Arrangements/Services Living arrangements for the past 2 months: Single Family Home Lives with:: Spouse Patient language and need for interpreter reviewed:: Yes Do you feel safe going back to the place where you live?: Yes            Criminal Activity/Legal Involvement Pertinent to Current Situation/Hospitalization: No - Comment as needed  Activities of Daily Living Home Assistive Devices/Equipment: None ADL Screening (condition at time of admission) Patient's cognitive ability adequate to safely complete daily activities?: Yes Is the patient deaf or have difficulty hearing?: No Does the  patient have difficulty seeing, even when wearing glasses/contacts?: No Does the patient have difficulty concentrating, remembering, or making decisions?: No Patient able to express need for assistance with ADLs?: Yes Does the patient have difficulty dressing or bathing?: Yes Independently performs ADLs?: Yes (appropriate for developmental age) Does the patient have difficulty walking or climbing stairs?: Yes (HX scoliosis) Weakness of Legs: Left Weakness of Arms/Hands: Left  Permission Sought/Granted                  Emotional Assessment Appearance:: Appears stated age     Orientation: : Oriented to Self, Oriented to Place, Oriented to  Time, Oriented to Situation Alcohol / Substance Use: Not Applicable Psych Involvement: No (comment)  Admission diagnosis:  Syncope [R55] Syncope, unspecified syncope type [R55] Patient Active Problem List   Diagnosis Date Noted   Bilateral leg edema 02/03/2022   Syncope 02/02/2022   CKD (chronic kidney disease), stage IV ([place]) 02/02/2022   Acute cholecystitis 12/21/2021   Anemia in chronic kidney disease 12/10/2020   Nephrotic range proteinuria 12/10/2020   Influenza vaccine refused 08/11/2020   Cervical myelopathy ([place]) 08/11/2020   Closed fracture of odontoid process of axis ([place]) 06/11/2019   Migraines 11/19/2018   Acute renal failure superimposed on chronic kidney disease ([place]) 10/03/2018   Morbid obesity ([place]) 07/12/2018   Muscle spasm of back 04/24/2017   Osteoarthritis of both hips 01/15/2017   Recurrent  headache 01/06/2017   Irregular menstrual cycle 05/30/2016   Chronic pain syndrome 09/21/2015   HYPERTRIGLYCERIDEMIA 05/02/2009   Insomnia 05/01/2009   Idiopathic scoliosis and kyphoscoliosis 05/18/2008   DM (diabetes mellitus), type 2 with renal complications ([place]) 86/82/5749   Depression 04/18/2008   Essential hypertension, benign 04/18/2008   PCP:  Ladell Pier, MD Pharmacy:   [place], [place] - [name] [place] [address] [name] [name] [place] [address] Phone: [phone] Fax: [phone]  [facility] [address] [place] [address] Phone: [phone] Fax: [phone]     Social Determinants of Health (SDOH) Interventions    Readmission Risk Interventions     No data to display

## 2022-02-05 NOTE — Discharge Instructions (Signed)
As we discussed:  -Start taking blood sugars atleast 2 times per day: first things in the AM and a couple of hours after dinner -Reach out to Primary care to make appointment -Endocrinology?? Dr Chalmers Cater, Dr Kelton Pillar, Dr Hartford Poli -Reach out to MD if blood sugars >180's mg/dL consistently or if < 70 mg/dL -Take long acting insulin as prescribed GLP? Trulicity vs Ozempic?

## 2022-02-05 NOTE — Discharge Summary (Signed)
Physician Discharge Summary  Briana Williams STM:[id number] DOB: October 30, 1980 DOA: 02/02/2022  PCP: Ladell Pier, MD  Admit date: 02/02/2022 Discharge date: 02/05/2022  Admitted From: home Disposition:  home  Recommendations for Outpatient Follow-up:  Follow up with PCP in 1-2 weeks Please obtain BMP/CBC in one week  Home Health: none Equipment/Devices: none  Discharge Condition: stable CODE STATUS: Full code Diet Orders (From admission, onward)     Start     Ordered   02/03/22 0735  Diet regular Room service appropriate? Yes; Fluid consistency: Thin  Diet effective now       Question Answer Comment  Room service appropriate? Yes   Fluid consistency: Thin      02/03/22 0734            HPI: 41 year old female with CKD stage IV, DM 2, HTN, HLD, chronic pain, severe scoliosis status post 40+ surgeries, progressive cervical myelopathy comes into the hospital with syncope x2.  She was walking her dog and apparently passed out without any warning or prodromal signs.  She reports pain in her head and neck, as well as more recently has had increased left-sided weakness.  She also reports lower extremity swelling for the past 3 weeks.  Also, her husband died recently and she is under a lot of stress.  She has started BuSpar as well as hydroxyzine in the last 3 weeks.  Hospital Course / Discharge diagnoses: Principal Problem:   Syncope Active Problems:   Cervical myelopathy (HCC)   Chronic pain syndrome   DM (diabetes mellitus), type 2 with renal complications (HCC)   CKD (chronic kidney disease), stage IV (HCC)   Anemia in chronic kidney disease   Essential hypertension, benign   Depression   Morbid obesity (HCC)   Bilateral leg edema   Principal problem Syncope-unclear etiology, but concern for cardiac more so given lack of prodromal symptoms.  Cardiology consulted and following patient while hospitalized.  She underwent a 2D echo which showed normal EF at 65 to  89%, grade 1 diastolic dysfunction, normal RV.  Carotids without significant stenosis.  They recommended monitor at discharge which will be arranged by cardiology   Active problems Cervical myelopathy, chronic pain, severe scoliosis, progressive left-sided weakness-due to progressive left-sided weakness, patient underwent an MRI of the brain and C-spine without significant acute findings.  Images were however impacted by underlying hardware  CKD stage IV-creatinine appears at baseline, she is ranging anywhere between 2.5-low threes.  Avoid nephrotoxins Morbid obesity-BMI 80, she would benefit from weight loss Essential hypertension -resume home medications Leg swelling, positive D-dimer-lower extremity Dopplers negative for DVT  DM-controlled, last A1c 6.8.  Continue home regimen  Sepsis ruled out   Discharge Instructions   Allergies as of 02/05/2022       Reactions   Hydrocodone Itching   Note: benadryl does NOT help   Lisinopril Nausea And Vomiting   Caused nausea   Metformin And Related Other (See Comments)   Causes blisters on skin   Norvasc [amlodipine Besylate] Swelling   Ankle swelling   Other Other (See Comments)   Paper Tape causes Blisters   Ultrasound Gel Rash, Other (See Comments)   Patient gets a red rash with use of ultrasound gel/ burns skin        Medication List     TAKE these medications    Accu-Chek Aviva Plus w/Device Kit 1 Device by Does not apply route 3 (three) times daily after meals. ICD 10 E11.9  Accu-Chek Guide w/Device Kit Use as directed   accu-chek softclix lancets 1 each by Other route 3 (three) times daily. ICD 10 E11.9   Accu-Chek Softclix Lancets lancets 1 each by Other route 3 (three) times daily. ICD 10 E11.9   Accu-Chek Softclix Lancets lancets Use as instructed   acetaminophen 650 MG CR tablet Commonly known as: 8 Hour Pain Reliever 1 (one) tablet by mouth three times daily, as needed   amitriptyline 25 MG  tablet Commonly known as: ELAVIL TAKE 1 TABLET(25 MG) BY MOUTH AT BEDTIME What changed: See the new instructions.   busPIRone 10 MG tablet Commonly known as: BUSPAR TAKE 1 TABLET(10 MG) BY MOUTH THREE TIMES DAILY What changed:  how much to take how to take this when to take this additional instructions   carvedilol 12.5 MG tablet Commonly known as: COREG Take 1 tablet (12.5 mg total) by mouth 2 (two) times daily with a meal.   escitalopram 20 MG tablet Commonly known as: LEXAPRO TAKE 1 TABLET(20 MG) BY MOUTH DAILY What changed:  how much to take how to take this when to take this additional instructions   ferrous sulfate 325 (65 FE) MG tablet Take 1 tablet (325 mg total) by mouth daily with breakfast.   gabapentin 400 MG capsule Commonly known as: NEURONTIN Take 1 capsule by mouth three times daily What changed:  how much to take how to take this when to take this   glucose blood test strip Commonly known as: Accu-Chek Aviva Plus 1 each by Other route 3 (three) times daily. ICD 10 E11.9   Accu-Chek Guide test strip Generic drug: glucose blood Test 3x/day before meals   hydrALAZINE 10 MG tablet Commonly known as: APRESOLINE Take 1 tablet (10 mg total) by mouth 2 (two) times daily.   hydrOXYzine 25 MG tablet Commonly known as: ATARAX Take 1-2 tabs PO q6-8 hours for anxiety PRN   Insulin Pen Needle 31G X 8 MM Misc Commonly known as: B-D ULTRAFINE III SHORT PEN 1 application by Does not apply route daily.   Januvia 25 MG tablet Generic drug: sitaGLIPtin Take 1 tablet (25 mg total) by mouth daily.   Lantus SoloStar 100 UNIT/ML Solostar Pen Generic drug: insulin glargine Inject 38 Units into the skin daily. What changed: how much to take   naloxone 4 MG/0.1ML Liqd nasal spray kit Commonly known as: Narcan Inhale 1 spray as needed.   norethindrone 0.35 MG tablet Commonly known as: MICRONOR Take 1 tablet by mouth daily.   NovoLOG FlexPen 100 UNIT/ML  FlexPen Generic drug: insulin aspart INJECT 15 UNITS INTO THE SKIN WITH THE TWO LARGEST MEALS OF THE DAY. What changed:  how much to take how to take this when to take this additional instructions   ondansetron 4 MG disintegrating tablet Commonly known as: ZOFRAN-ODT Take 1 tablet (4 mg total) by mouth every 8 (eight) hours as needed.   Oxycodone HCl 10 MG Tabs take one tablet by mouth five times daily What changed:  how much to take how to take this when to take this Another medication with the same name was removed. Continue taking this medication, and follow the directions you see here.   tiZANidine 4 MG tablet Commonly known as: ZANAFLEX TAKE 1 TABLET BY MOUTH EVERY 8 HOURS AS NEEDED What changed:  how much to take how to take this when to take this reasons to take this additional instructions   Vitamin D (Ergocalciferol) 1.25 MG (50000 UNIT) Caps capsule Commonly  known as: DRISDOL Take 1 capsule by mouth once a week What changed:  how much to take how to take this when to take this additional instructions         Consultations: Cardiology   Procedures/Studies:  MR BRAIN WO CONTRAST  Result Date: 02/05/2022 CLINICAL DATA:  Syncope/presyncope, cerebrovascular cause suspected; Cervical radiculopathy, prior cervical surgery EXAM: MRI HEAD WITHOUT CONTRAST MRI CERVICAL SPINE WITHOUT CONTRAST TECHNIQUE: Multiplanar, multiecho pulse sequences of the brain and surrounding structures, and cervical spine, to include the craniocervical junction and cervicothoracic junction, were obtained without intravenous contrast. COMPARISON:  MRI cervical spine Dec 07, 2018. CT head and CT cervical spine 02/02/2022. MRI head Dec 01, 2018 (without report). FINDINGS: MRI HEAD FINDINGS Brain: Artifact obscures a portion of the inferior cerebellum DWI/ADC and SWI imaging. Within this limitation, no visible acute infarct, acute hemorrhage, mass lesion, midline shift, or extra-axial fluid  collection. Mild scattered T2/FLAIR hyperintensities in the white matter, nonspecific but compatible with chronic microvascular ischemic disease. Small remote left cerebellar and right basal ganglia lacunar infarcts. Vascular: Major arterial flow voids are maintained at the skull base. Skull and upper cervical spine: Normal marrow signal. Sinuses/Orbits: Clear sinuses.  No acute orbital findings. Other: No mastoid effusions. MRI CERVICAL SPINE FINDINGS Alignment: Similar scoliosis without substantial sagittal subluxation. Vertebrae: Posterior fusion hardware throughout the cervical spine with C3-C6 ACDF. Chronic dens fracture, better characterized on recent CT. Artifact from fusion hardware limits assessment, but no definite evidence of acute fracture. Craniocervical degenerative change. Cord: Normal visualized cord signal. Posterior Fossa, vertebral arteries, paraspinal tissues: Visualized vertebral artery flow voids are maintained. Disc levels: Craniocervical degenerative change. Assessment of the disc levels is limited by metallic artifact from fusion hardware and mild motion. C2-C3: Posterior disc osteophyte complex and right greater than left facet and uncovertebral hypertrophy. Resulting moderate right foraminal stenosis. Mild canal stenosis. C3-C4: Left paracentral disc/osteophyte effaces ventral CSF. Posterior decompression. Patent canal. Left greater than right facet and uncovertebral hypertrophy with moderate left and mild-to-moderate right foraminal stenosis. C4-C5: Left greater than right facet and uncovertebral hypertrophy with resulting moderate left and mild right foraminal stenosis. Left eccentric endplate spurring without significant canal stenosis. C5-C6: Left paracentral disc protrusion/endplate spurring. Left greater than right facet and uncovertebral hypertrophy. Resulting moderate left and mild right foraminal stenosis. Mild canal stenosis. C6-C7: Left paracentral disc/endplate spurring. Left  greater than right facet and uncovertebral hypertrophy. Moderate left foraminal stenosis. Right foramen appears patent. Patent canal. C7-T1: Bilateral uncovertebral hypertrophy with mild left foraminal stenosis. Patent canal. IMPRESSION: MRI head: 1. Limited study without evidence of acute intracranial abnormality. 2. Small remote left cerebellar and right basal ganglia lacunar infarcts and mild chronic microvascular ischemic disease. MRI cervical spine: 1. Metallic artifact from fusion hardware limits assessment with multilevel suspected moderate foraminal stenosis and multilevel mild canal stenosis, detailed above. 2. Chronic dens fracture, better characterized on recent CT. Electronically Signed   By: Margaretha Sheffield M.D.   On: 02/05/2022 10:15   MR CERVICAL SPINE WO CONTRAST  Result Date: 02/05/2022 CLINICAL DATA:  Syncope/presyncope, cerebrovascular cause suspected; Cervical radiculopathy, prior cervical surgery EXAM: MRI HEAD WITHOUT CONTRAST MRI CERVICAL SPINE WITHOUT CONTRAST TECHNIQUE: Multiplanar, multiecho pulse sequences of the brain and surrounding structures, and cervical spine, to include the craniocervical junction and cervicothoracic junction, were obtained without intravenous contrast. COMPARISON:  MRI cervical spine Dec 07, 2018. CT head and CT cervical spine 02/02/2022. MRI head Dec 01, 2018 (without report). FINDINGS: MRI HEAD FINDINGS Brain: Artifact obscures a portion  of the inferior cerebellum DWI/ADC and SWI imaging. Within this limitation, no visible acute infarct, acute hemorrhage, mass lesion, midline shift, or extra-axial fluid collection. Mild scattered T2/FLAIR hyperintensities in the white matter, nonspecific but compatible with chronic microvascular ischemic disease. Small remote left cerebellar and right basal ganglia lacunar infarcts. Vascular: Major arterial flow voids are maintained at the skull base. Skull and upper cervical spine: Normal marrow signal. Sinuses/Orbits:  Clear sinuses.  No acute orbital findings. Other: No mastoid effusions. MRI CERVICAL SPINE FINDINGS Alignment: Similar scoliosis without substantial sagittal subluxation. Vertebrae: Posterior fusion hardware throughout the cervical spine with C3-C6 ACDF. Chronic dens fracture, better characterized on recent CT. Artifact from fusion hardware limits assessment, but no definite evidence of acute fracture. Craniocervical degenerative change. Cord: Normal visualized cord signal. Posterior Fossa, vertebral arteries, paraspinal tissues: Visualized vertebral artery flow voids are maintained. Disc levels: Craniocervical degenerative change. Assessment of the disc levels is limited by metallic artifact from fusion hardware and mild motion. C2-C3: Posterior disc osteophyte complex and right greater than left facet and uncovertebral hypertrophy. Resulting moderate right foraminal stenosis. Mild canal stenosis. C3-C4: Left paracentral disc/osteophyte effaces ventral CSF. Posterior decompression. Patent canal. Left greater than right facet and uncovertebral hypertrophy with moderate left and mild-to-moderate right foraminal stenosis. C4-C5: Left greater than right facet and uncovertebral hypertrophy with resulting moderate left and mild right foraminal stenosis. Left eccentric endplate spurring without significant canal stenosis. C5-C6: Left paracentral disc protrusion/endplate spurring. Left greater than right facet and uncovertebral hypertrophy. Resulting moderate left and mild right foraminal stenosis. Mild canal stenosis. C6-C7: Left paracentral disc/endplate spurring. Left greater than right facet and uncovertebral hypertrophy. Moderate left foraminal stenosis. Right foramen appears patent. Patent canal. C7-T1: Bilateral uncovertebral hypertrophy with mild left foraminal stenosis. Patent canal. IMPRESSION: MRI head: 1. Limited study without evidence of acute intracranial abnormality. 2. Small remote left cerebellar and right  basal ganglia lacunar infarcts and mild chronic microvascular ischemic disease. MRI cervical spine: 1. Metallic artifact from fusion hardware limits assessment with multilevel suspected moderate foraminal stenosis and multilevel mild canal stenosis, detailed above. 2. Chronic dens fracture, better characterized on recent CT. Electronically Signed   By: Margaretha Sheffield M.D.   On: 02/05/2022 10:15   VAS [name] LOWER EXTREMITY VENOUS (DVT)  Result Date: 02/04/2022  Lower Venous DVT Study Patient Name:  Briana Williams [facility]  Date of Exam:   02/04/2022 Medical Rec #: [id number]             Accession #:    [id number] Date of Birth: [birth year]              Patient Gender: F Patient Age:   20 years Exam Location:  [facility] Procedure:      VAS [name] LOWER EXTREMITY VENOUS (DVT) Referring Phys: Nyoka Lint DOUTOVA --------------------------------------------------------------------------------  Indications: Edema.  Limitations: Body habitus and poor ultrasound/tissue interface. Comparison Study: Previous exam on 07/11/18 was negative for DVT Performing Technologist: Rogelia Rohrer RVT, RDMS  Examination Guidelines: A complete evaluation includes B-mode imaging, spectral Doppler, color Doppler, and power Doppler as needed of all accessible portions of each vessel. Bilateral testing is considered an integral part of a complete examination. Limited examinations for reoccurring indications may be performed as noted. The reflux portion of the exam is performed with the patient in reverse Trendelenburg.  +---------+---------------+---------+-----------+---------------+--------------+ RIGHT    CompressibilityPhasicitySpontaneityProperties     Thrombus Aging +---------+---------------+---------+-----------+---------------+--------------+ CFV      Full           Yes  Yes                                      +---------+---------------+---------+-----------+---------------+--------------+ SFJ      Full                                                              +---------+---------------+---------+-----------+---------------+--------------+ FV Prox  Full           Yes      Yes                                      +---------+---------------+---------+-----------+---------------+--------------+ FV Mid                  Yes      Yes        patent by                                                                 color/doppler                 +---------+---------------+---------+-----------+---------------+--------------+ FV Distal               Yes      Yes        patent by                                                                 color/doppler                 +---------+---------------+---------+-----------+---------------+--------------+ PFV      Full                                                             +---------+---------------+---------+-----------+---------------+--------------+ POP      Full           Yes      Yes                                      +---------+---------------+---------+-----------+---------------+--------------+ PTV                                                        Not well  visualized     +---------+---------------+---------+-----------+---------------+--------------+ PERO                                                       Not well                                                                  visualized     +---------+---------------+---------+-----------+---------------+--------------+   +---------+---------------+---------+-----------+----------+--------------+ LEFT     CompressibilityPhasicitySpontaneityPropertiesThrombus Aging +---------+---------------+---------+-----------+----------+--------------+ CFV      Full           Yes      Yes                                  +---------+---------------+---------+-----------+----------+--------------+ SFJ      Full                                                        +---------+---------------+---------+-----------+----------+--------------+ FV Prox  Full           Yes      Yes                                 +---------+---------------+---------+-----------+----------+--------------+ FV Mid                  Yes      Yes                                 +---------+---------------+---------+-----------+----------+--------------+ FV DistalFull           Yes      Yes                                 +---------+---------------+---------+-----------+----------+--------------+ PFV      Full                                                        +---------+---------------+---------+-----------+----------+--------------+ POP      Full           Yes      Yes                                 +---------+---------------+---------+-----------+----------+--------------+ PTV      Full                                                        +---------+---------------+---------+-----------+----------+--------------+  PERO     Full                                                        +---------+---------------+---------+-----------+----------+--------------+     Summary: BILATERAL: - No evidence of deep vein thrombosis seen in the lower extremities, bilaterally. -No evidence of popliteal cyst, bilaterally. -Subcutaneous edema, bilaterally (calves). R > L   *See table(s) above for measurements and observations. Electronically signed by Monica Martinez MD on 02/04/2022 at 1:52:57 PM.    Final    VAS US CAROTID  Result Date: 02/04/2022 Carotid Arterial Duplex Study Patient Name:  AZALYNN MAXIM [facility]  Date of Exam:   02/04/2022 Medical Rec #: [id number]             Accession #:    [id number] Date of Birth: 11/15/1980              Patient Gender: F Patient Age:   74 years Exam [facility] Procedure:      VAS US CAROTID Referring Phys: Nyoka Lint DOUTOVA --------------------------------------------------------------------------------  Indications:   Syncope. Risk Factors:  Hypertension, hyperlipidemia, Diabetes, no history of smoking. Other Factors: CKD. Limitations    Today's exam was limited due to the body habitus of the patient                and limited mobility of neck due to several prior surgeries. Performing Technologist: Rogelia Rohrer RVT, RDMS  Examination Guidelines: A complete evaluation includes B-mode imaging, spectral Doppler, color Doppler, and power Doppler as needed of all accessible portions of each vessel. Bilateral testing is considered an integral part of a complete examination. Limited examinations for reoccurring indications may be performed as noted.  Right Carotid Findings: +----------+--------+--------+--------+------------------+--------+           PSV cm/sEDV cm/sStenosisPlaque DescriptionComments +----------+--------+--------+--------+------------------+--------+ CCA Prox  88      21                                         +----------+--------+--------+--------+------------------+--------+ CCA Distal69      18                                         +----------+--------+--------+--------+------------------+--------+ ICA Prox  62      24                                         +----------+--------+--------+--------+------------------+--------+ ICA Distal112     49                                         +----------+--------+--------+--------+------------------+--------+ ECA       108     15                                         +----------+--------+--------+--------+------------------+--------+ +----------+--------+-------+----------------+-------------------+  PSV cm/sEDV cmsDescribe        Arm Pressure (mmHG) +----------+--------+-------+----------------+-------------------+ [id number]             Multiphasic, WNL                    +----------+--------+-------+----------------+-------------------+ +---------+--------+--+--------+--+---------+ VertebralPSV cm/s43EDV cm/s14Antegrade +---------+--------+--+--------+--+---------+  Left Carotid Findings: +----------+--------+--------+--------+------------------+--------+           PSV cm/sEDV cm/sStenosisPlaque DescriptionComments +----------+--------+--------+--------+------------------+--------+ CCA Prox  91      21                                         +----------+--------+--------+--------+------------------+--------+ CCA Distal76      21                                         +----------+--------+--------+--------+------------------+--------+ ICA Prox  65      20                                         +----------+--------+--------+--------+------------------+--------+ ICA Distal88      34                                         +----------+--------+--------+--------+------------------+--------+ ECA       98      18                                         +----------+--------+--------+--------+------------------+--------+ +----------+--------+--------+----------------+-------------------+           PSV cm/sEDV cm/sDescribe        Arm Pressure (mmHG) +----------+--------+--------+----------------+-------------------+ [id number]             Multiphasic, WNL                    +----------+--------+--------+----------------+-------------------+ +---------+--------+--------+--------------+ VertebralPSV cm/sEDV cm/sNot identified +---------+--------+--------+--------------+   Summary: Right Carotid: The extracranial vessels were near-normal with only minimal wall                thickening or plaque. Left Carotid: The extracranial vessels were near-normal with only minimal wall               thickening or plaque. Vertebrals:  Right vertebral artery demonstrates antegrade flow. Left vertebral               artery was not visualized. Subclavians: Normal flow hemodynamics were seen in bilateral subclavian              arteries. *See table(s) above for measurements and observations.  Electronically signed by Monica Martinez MD on 02/04/2022 at 1:45:13 PM.    Final    ECHOCARDIOGRAM COMPLETE  Result Date: 02/03/2022    ECHOCARDIOGRAM REPORT   Patient Name:   TANISH SINKLER Date of Exam: 02/03/2022 Medical Rec #:  [id number]            Height:       55.5 in Accession #:    [id number]           Weight:  227.0 lb Date of Birth:  1981-01-27             BSA:          1.862 m Patient Age:    40 years             BP:           117/69 mmHg Patient Gender: F                    HR:           74 bpm. Exam Location:  Inpatient Procedure: 2D Echo, Cardiac Doppler, Color Doppler and Intracardiac            Opacification Agent Indications:    R55 Syncope  History:        Patient has no prior history of Echocardiogram examinations.                 Risk Factors:GERD, Dyslipidemia, Diabetes, Hypertension and                 Sleep Apnea.  Sonographer:    Merrie Roof RDCS Referring Phys: [place]  Sonographer Comments: Technically difficult study due to poor echo windows and patient is morbidly obese. IMPRESSIONS  1. Left ventricular ejection fraction, by estimation, is 65 to 70%. The left ventricle has normal function. The left ventricle has no regional wall motion abnormalities. Left ventricular diastolic parameters are consistent with Grade I diastolic dysfunction (impaired relaxation).  2. Right ventricular systolic function is normal. The right ventricular size is normal.  3. The mitral valve is normal in structure. No evidence of mitral valve regurgitation. No evidence of mitral stenosis.  4. The aortic valve is normal in structure. Aortic valve regurgitation is not visualized. No aortic stenosis is present.  5. The inferior vena cava is normal in size with greater than 50% respiratory  variability, suggesting right atrial pressure of 3 mmHg. FINDINGS  Left Ventricle: Left ventricular ejection fraction, by estimation, is 65 to 70%. The left ventricle has normal function. The left ventricle has no regional wall motion abnormalities. Definity contrast agent was given IV to delineate the left ventricular  endocardial borders. The left ventricular internal cavity size was normal in size. There is no left ventricular hypertrophy. Left ventricular diastolic parameters are consistent with Grade I diastolic dysfunction (impaired relaxation). Right Ventricle: The right ventricular size is normal. No increase in right ventricular wall thickness. Right ventricular systolic function is normal. Left Atrium: Left atrial size was normal in size. Right Atrium: Right atrial size was normal in size. Pericardium: There is no evidence of pericardial effusion. Mitral Valve: The mitral valve is normal in structure. No evidence of mitral valve regurgitation. No evidence of mitral valve stenosis. Tricuspid Valve: The tricuspid valve is normal in structure. Tricuspid valve regurgitation is not demonstrated. No evidence of tricuspid stenosis. Aortic Valve: The aortic valve is normal in structure. Aortic valve regurgitation is not visualized. No aortic stenosis is present. Pulmonic Valve: The pulmonic valve was normal in structure. Pulmonic valve regurgitation is not visualized. No evidence of pulmonic stenosis. Aorta: The aortic root is normal in size and structure. Venous: The inferior vena cava is normal in size with greater than 50% respiratory variability, suggesting right atrial pressure of 3 mmHg. IAS/Shunts: No atrial level shunt detected by color flow Doppler.  LEFT VENTRICLE PLAX 2D LVIDd:         4.30 cm   Diastology LVIDs:  2.60 cm   LV e' medial:    5.50 cm/s LV PW:         1.10 cm   LV E/e' medial:  15.0 LV IVS:        0.90 cm   LV e' lateral:   5.98 cm/s LVOT diam:     2.10 cm   LV E/e' lateral: 13.8 LV  SV:         72 LV SV Index:   39 LVOT Area:     3.46 cm  RIGHT VENTRICLE RV S prime:     11.10 cm/s TAPSE (M-mode): 1.5 cm LEFT ATRIUM             Index        RIGHT ATRIUM           Index LA diam:        4.30 cm 2.31 cm/m   RA Area:     11.20 cm LA Vol (A2C):   41.2 ml 22.13 ml/m  RA Volume:   23.30 ml  12.51 ml/m LA Vol (A4C):   54.6 ml 29.33 ml/m LA Biplane Vol: 48.1 ml 25.83 ml/m  AORTIC VALVE LVOT Vmax:   87.50 cm/s LVOT Vmean:  56.300 cm/s LVOT VTI:    0.207 m  AORTA Ao Root diam: 3.20 cm Ao Asc diam:  3.30 cm MITRAL VALVE MV Area (PHT): 4.17 cm    SHUNTS MV Decel Time: 182 msec    Systemic VTI:  0.21 m MV E velocity: 82.40 cm/s  Systemic Diam: 2.10 cm MV A velocity: 87.70 cm/s MV E/A ratio:  0.94 Candee Furbish MD Electronically signed by Candee Furbish MD Signature Date/Time: 02/03/2022/12:57:00 PM    Final    DG CHEST PORT 1 VIEW  Result Date: 02/03/2022 CLINICAL DATA:  Syncope EXAM: PORTABLE CHEST 1 VIEW COMPARISON:  12/17/2021 FINDINGS: Low volume chest with interstitial crowding at the bases. There is no edema, consolidation, effusion, or pneumothorax. Borderline heart size. Spinal fusion as previously noted. IMPRESSION: Chronic low volume chest.  No acute finding. Electronically Signed   By: Jorje Guild M.D.   On: 02/03/2022 06:26   CT Thoracic Spine Wo Contrast  Result Date: 02/02/2022 CLINICAL DATA:  Left arm and left leg heaviness after 2 syncopal episodes earlier today. History of cervical fusion. EXAM: CT THORACIC SPINE WITHOUT CONTRAST TECHNIQUE: Multidetector CT images of the thoracic were obtained using the standard protocol without intravenous contrast. RADIATION DOSE REDUCTION: This exam was performed according to the departmental dose-optimization program which includes automated exposure control, adjustment of the mA and/or kV according to patient size and/or use of iterative reconstruction technique. COMPARISON:  Thoracic spine x-rays dated Dec 09, 2020. MRI thoracic spine dated  January 04, 2019. FINDINGS: Alignment: Unchanged dextroscoliosis of the midthoracic spine and levoscoliosis of the upper lumbar spine. No listhesis. Vertebrae: No acute fracture or focal pathologic process. Paraspinal and other soft tissues: Punctate bilateral renal calculi. Prior cholecystectomy. Disc levels: Prior posterior cervicothoracic fusion to T3. Diffuse solid osseous fusion of the thoracic vertebral bodies and posterior elements. Peritoneal right remnant again seen on the right at the thoracolumbar junction. IMPRESSION: 1. No acute osseous abnormality. 2. Solid osseous fusion of the thoracic spine. 3. Punctate bilateral nephrolithiasis. Electronically Signed   By: Titus Dubin M.D.   On: 02/02/2022 16:27   DG Shoulder Left  Result Date: 02/02/2022 CLINICAL DATA:  Left shoulder pain EXAM: LEFT SHOULDER - 2+ VIEW COMPARISON:  None Available. FINDINGS: There is no  evidence of fracture or dislocation. There is no evidence of arthropathy or other focal bone abnormality. Partially imaged cervicothoracic fusion hardware. Soft tissues are unremarkable. IMPRESSION: No fracture or dislocation of the left shoulder. Joint spaces are preserved. Electronically Signed   By: Delanna Ahmadi M.D.   On: 02/02/2022 15:39   CT Head Wo Contrast  Result Date: 02/02/2022 CLINICAL DATA:  Neck trauma, midline tenderness (Age 15-64y); Head trauma, moderate-severe EXAM: CT HEAD WITHOUT CONTRAST CT CERVICAL SPINE WITHOUT CONTRAST TECHNIQUE: Multidetector CT imaging of the head and cervical spine was performed following the standard protocol without intravenous contrast. Multiplanar CT image reconstructions of the cervical spine were also generated. RADIATION DOSE REDUCTION: This exam was performed according to the departmental dose-optimization program which includes automated exposure control, adjustment of the mA and/or kV according to patient size and/or use of iterative reconstruction technique. COMPARISON:  CT cervical spine  04/27/2019. FINDINGS: CT HEAD FINDINGS Brain: No evidence of acute infarction, hemorrhage, hydrocephalus, extra-axial collection or mass lesion/mass effect. Vascular: No hyperdense vessel identified. Skull: No acute fracture. Sinuses/Orbits: Clear visualized sinuses. No acute orbital findings. Other: No mastoid effusions. CT CERVICAL SPINE FINDINGS Alignment: No substantial sagittal subluxation. Levocurvature of the lower cervical spine. Skull base and vertebrae: Posterior fusion from C2-C3 and anterior fusion from C3-C6. Extensive bony fusion throughout the cervical and upper thoracic spine. Streak artifact from hardware limits assessment. No evidence of acute fracture. Remote/healed dens fracture. Soft tissues and spinal canal: No prevertebral fluid or swelling. No visible canal hematoma. Disc levels: Multilevel degenerative change including facet arthropathy and endplate spurring. Varying degrees of neural foraminal stenosis. Extensive posterior decompression. Upper chest: Visualized lung apices are clear. IMPRESSION: 1. No evidence of acute intracranial abnormality. 2. Remote dens fracture without change in alignment. 3. No evidence of acute fracture or traumatic malalignment. 4. Posterior fusion from C2-C3 and anterior fusion from C3-C6. 5. Extensive bony fusion throughout the cervical and upper thoracic spine. Electronically Signed   By: Margaretha Sheffield M.D.   On: 02/02/2022 15:22   CT Cervical Spine Wo Contrast  Result Date: 02/02/2022 CLINICAL DATA:  Neck trauma, midline tenderness (Age 15-64y); Head trauma, moderate-severe EXAM: CT HEAD WITHOUT CONTRAST CT CERVICAL SPINE WITHOUT CONTRAST TECHNIQUE: Multidetector CT imaging of the head and cervical spine was performed following the standard protocol without intravenous contrast. Multiplanar CT image reconstructions of the cervical spine were also generated. RADIATION DOSE REDUCTION: This exam was performed according to the departmental dose-optimization  program which includes automated exposure control, adjustment of the mA and/or kV according to patient size and/or use of iterative reconstruction technique. COMPARISON:  CT cervical spine 04/27/2019. FINDINGS: CT HEAD FINDINGS Brain: No evidence of acute infarction, hemorrhage, hydrocephalus, extra-axial collection or mass lesion/mass effect. Vascular: No hyperdense vessel identified. Skull: No acute fracture. Sinuses/Orbits: Clear visualized sinuses. No acute orbital findings. Other: No mastoid effusions. CT CERVICAL SPINE FINDINGS Alignment: No substantial sagittal subluxation. Levocurvature of the lower cervical spine. Skull base and vertebrae: Posterior fusion from C2-C3 and anterior fusion from C3-C6. Extensive bony fusion throughout the cervical and upper thoracic spine. Streak artifact from hardware limits assessment. No evidence of acute fracture. Remote/healed dens fracture. Soft tissues and spinal canal: No prevertebral fluid or swelling. No visible canal hematoma. Disc levels: Multilevel degenerative change including facet arthropathy and endplate spurring. Varying degrees of neural foraminal stenosis. Extensive posterior decompression. Upper chest: Visualized lung apices are clear. IMPRESSION: 1. No evidence of acute intracranial abnormality. 2. Remote dens fracture without change in alignment. 3.  No evidence of acute fracture or traumatic malalignment. 4. Posterior fusion from C2-C3 and anterior fusion from C3-C6. 5. Extensive bony fusion throughout the cervical and upper thoracic spine. Electronically Signed   By: Margaretha Sheffield M.D.   On: 02/02/2022 15:22     Subjective: - no chest pain, shortness of breath, no abdominal pain, nausea or vomiting.   Discharge Exam: BP (!) 154/93 (BP Location: Left Arm)   Pulse 90   Temp 98.3 F (36.8 C)   Resp 17   Ht 4' 7.5" (1.41 m)   Wt 103 kg   LMP 01/08/2022 (Exact Date)   SpO2 93%   BMI 51.83 kg/m   General: Pt is alert, awake, not in acute  distress   The results of significant diagnostics from this hospitalization (including imaging, microbiology, ancillary and laboratory) are listed below for reference.     Microbiology: No results found for this or any previous visit (from the past 240 hour(s)).   Labs: Basic Metabolic Panel: Recent Labs  Lab 02/02/22 1339 02/02/22 1558 02/02/22 2313 02/03/22 0239  NA 135  --  140 143  K 5.5* 4.5 4.0 4.2  CL 105  --  110 114*  CO2 21*  --  22 23  GLUCOSE 174*  --  109* 202*  BUN 40*  --  38* 39*  CREATININE 2.55*  --  2.55* 2.60*  CALCIUM 9.3  --  9.0 9.1  MG  --   --  1.7  --   PHOS  --   --  4.0  --    Liver Function Tests: Recent Labs  Lab 02/02/22 2313 02/03/22 0239  AST 18 19  ALT 18 17  ALKPHOS 48 49  BILITOT 0.8 0.8  PROT 6.8 6.5  ALBUMIN 3.3* 3.3*   CBC: Recent Labs  Lab 02/02/22 1339 02/02/22 2313 02/03/22 0239  WBC 7.1 5.8 6.9  NEUTROABS  --  3.1  --   HGB 9.7* 10.1* 10.1*  HCT 30.1* 30.9* 31.6*  MCV 92.9 93.9 95.5  PLT 145* 152 161   CBG: Recent Labs  Lab 02/04/22 1939 02/04/22 2336 02/05/22 0021 02/05/22 0306 02/05/22 0726  GLUCAP 286* 415* 452* 435* 284*   Hgb A1c Recent Labs    02/02/22 2313  HGBA1C 6.8*   Lipid Profile No results for input(s): "CHOL", "HDL", "LDLCALC", "TRIG", "CHOLHDL", "LDLDIRECT" in the last 72 hours. Thyroid function studies Recent Labs    02/02/22 2313  TSH 2.713   Urinalysis    Component Value Date/Time   COLORURINE YELLOW 02/02/2022 1559   APPEARANCEUR CLEAR 02/02/2022 1559   LABSPEC 1.012 02/02/2022 1559   PHURINE 6.5 02/02/2022 1559   GLUCOSEU 250 (A) 02/02/2022 1559   HGBUR SMALL (A) 02/02/2022 1559   HGBUR negative 10/04/2010 0947   BILIRUBINUR NEGATIVE 02/02/2022 1559   BILIRUBINUR small (A) 03/11/2019 1401   KETONESUR NEGATIVE 02/02/2022 1559   PROTEINUR >300 (A) 02/02/2022 1559   UROBILINOGEN 0.2 03/11/2019 1401   UROBILINOGEN 0.2 01/12/2011 2009   NITRITE NEGATIVE 02/02/2022  1559   LEUKOCYTESUR NEGATIVE 02/02/2022 1559    FURTHER DISCHARGE INSTRUCTIONS:   Get Medicines reviewed and adjusted: Please take all your medications with you for your next visit with your Primary MD   Laboratory/radiological data: Please request your Primary MD to go over all hospital tests and procedure/radiological results at the follow up, please ask your Primary MD to get all Hospital records sent to his/her office.   In some cases, they will be  blood work, cultures and biopsy results pending at the time of your discharge. Please request that your primary care M.D. goes through all the records of your hospital data and follows up on these results.   Also Note the following: If you experience worsening of your admission symptoms, develop shortness of breath, life threatening emergency, suicidal or homicidal thoughts you must seek medical attention immediately by calling 911 or calling your MD immediately  if symptoms less severe.   You must read complete instructions/literature along with all the possible adverse reactions/side effects for all the Medicines you take and that have been prescribed to you. Take any new Medicines after you have completely understood and accpet all the possible adverse reactions/side effects.    Do not drive when taking Pain medications or sleeping medications (Benzodaizepines)   Do not take more than prescribed Pain, Sleep and Anxiety Medications. It is not advisable to combine anxiety,sleep and pain medications without talking with your primary care practitioner   Special Instructions: If you have smoked or chewed Tobacco  in the last 2 yrs please stop smoking, stop any regular Alcohol  and or any Recreational drug use.   Wear Seat belts while driving.   Please note: You were cared for by a hospitalist during your hospital stay. Once you are discharged, your primary care physician will handle any further medical issues. Please note that NO REFILLS for  any discharge medications will be authorized once you are discharged, as it is imperative that you return to your primary care physician (or establish a relationship with a primary care physician if you do not have one) for your post hospital discharge needs so that they can reassess your need for medications and monitor your lab values.  Time coordinating discharge: 40 minutes  SIGNED:  Marzetta Board, MD, PhD 02/05/2022, 10:40 AM

## 2022-02-06 ENCOUNTER — Ambulatory Visit (INDEPENDENT_AMBULATORY_CARE_PROVIDER_SITE_OTHER): Payer: Medicaid Other

## 2022-02-06 ENCOUNTER — Telehealth: Payer: Self-pay

## 2022-02-06 ENCOUNTER — Other Ambulatory Visit: Payer: Self-pay | Admitting: Student

## 2022-02-06 DIAGNOSIS — R55 Syncope and collapse: Secondary | ICD-10-CM

## 2022-02-06 NOTE — Telephone Encounter (Signed)
Transition Care Management Unsuccessful Follow-up Telephone Call  Date of discharge and from where:  02/05/2022, [facility]  Attempts:  1st Attempt  Reason for unsuccessful TCM follow-up call:  Left voice message  # [phone], call back requested.  Patient has appointment at [facility] with Dr Wynetta Emery - 02/19/2022.

## 2022-02-06 NOTE — Telephone Encounter (Signed)
Transition Care Management Follow-up Telephone Call Date of discharge and from where: 02/05/2022, [facility] How have you been since you were released from the hospital? She said she is glad to be home. Any questions or concerns? Yes- her only concern is that [facility] is far from her home and she is considering finding a PCP office closer to her home. She said she is not ready to change yet, but is thinking about it and will be at her upcoming appointment with Dr Wynetta Emery.   Items Reviewed: Did the pt receive and understand the discharge instructions provided? Yes  Medications obtained and verified?  She said she will need to review the AVS and check her medications to make sure she has everything  I instructed her to call back with any questions. Other? No  Any new allergies since your discharge? No  Dietary orders reviewed? No Do you have support at home? Yes   Home Care and Equipment/Supplies: Were home health services ordered? no If so, what is the name of the agency? N/a  Has the agency set up a time to come to the patient's home? not applicable Were any new equipment or medical supplies ordered?  No What is the name of the medical supply agency? N/a Were you able to get the supplies/equipment? not applicable Do you have any questions related to the use of the equipment or supplies? No  Functional Questionnaire: (I = Independent and D = Dependent) ADLs: independent   Follow up appointments reviewed:  PCP Hospital f/u appt confirmed? Yes  Scheduled to see Dr Wynetta Emery- 02/19/2022,  [facility] f/u appt confirmed? Yes  Scheduled to see cardiology- 03/13/2022,  Are transportation arrangements needed? No  If their condition worsens, is the pt aware to call PCP or go to the Emergency Dept.? Yes Was the patient provided with contact information for the PCP's office or ED? Yes Was to pt encouraged to call back with questions or concerns? Yes

## 2022-02-06 NOTE — Progress Notes (Unsigned)
Enrolled for [employer] to mail a ZIO XT long term holter monitor to the patients address on file.   Dr. Hilty to read. 

## 2022-02-06 NOTE — Progress Notes (Signed)
Ordered 2 week Zio monitor for further evaluation of syncope per Dr. Debara Pickett. Will also arrange follow-up.  Darreld Mclean, PA-C 02/06/2022 7:18 AM

## 2022-02-15 DIAGNOSIS — R55 Syncope and collapse: Secondary | ICD-10-CM

## 2022-02-19 ENCOUNTER — Ambulatory Visit: Payer: Medicaid Other | Attending: Internal Medicine | Admitting: Internal Medicine

## 2022-02-19 ENCOUNTER — Encounter: Payer: Self-pay | Admitting: Internal Medicine

## 2022-02-19 ENCOUNTER — Other Ambulatory Visit (HOSPITAL_COMMUNITY): Payer: Self-pay

## 2022-02-19 VITALS — BP 138/92 | HR 67 | Temp 98.4°F | Resp 16 | Ht <= 58 in | Wt 233.0 lb

## 2022-02-19 DIAGNOSIS — I152 Hypertension secondary to endocrine disorders: Secondary | ICD-10-CM

## 2022-02-19 DIAGNOSIS — F321 Major depressive disorder, single episode, moderate: Secondary | ICD-10-CM | POA: Diagnosis not present

## 2022-02-19 DIAGNOSIS — E1169 Type 2 diabetes mellitus with other specified complication: Secondary | ICD-10-CM

## 2022-02-19 DIAGNOSIS — E1159 Type 2 diabetes mellitus with other circulatory complications: Secondary | ICD-10-CM

## 2022-02-19 DIAGNOSIS — Z09 Encounter for follow-up examination after completed treatment for conditions other than malignant neoplasm: Secondary | ICD-10-CM

## 2022-02-19 DIAGNOSIS — R110 Nausea: Secondary | ICD-10-CM

## 2022-02-19 DIAGNOSIS — F4321 Adjustment disorder with depressed mood: Secondary | ICD-10-CM | POA: Diagnosis not present

## 2022-02-19 DIAGNOSIS — F411 Generalized anxiety disorder: Secondary | ICD-10-CM

## 2022-02-19 DIAGNOSIS — R55 Syncope and collapse: Secondary | ICD-10-CM

## 2022-02-19 DIAGNOSIS — F4320 Adjustment disorder, unspecified: Secondary | ICD-10-CM

## 2022-02-19 NOTE — Progress Notes (Unsigned)
Patient ID: [name], female    DOB: 01/30/1981  MRN: [id number]  CC: TOC Date of hospitalization: 7/8-05/2022 Date of call with case worker: 02/06/2022   Subjective: Briana Williams is a 41 y.o. female who presents for transition of care visit.  Her mother-in-law is with her.  Her concerns today include:  HTN, insomnia, DM2 with macroalbumin, depression,anxiety migraines, OA hips seen on MRI, morbid obesity. 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 Syncope of unclear etiology but concern for cardiac cause because of lack of prodromal symptoms.  Seen by cardiologist.  2D echo showed EF 65 to 70% with grade 1 diastolic dysfunction.  Carotids without significant stenosis.  Recommended Zio patch to be placed as an outpatient. -A1c surprisingly was 6.8. CKD stage IV remained stable with creatinine ranging between 2.5 to the low threes. MRI of the head revealed small left cerebellar and right basal ganglia lacunar infarcts and mild microvascular ischemic disease.  Today: Since discharge, she was mailed a Zio patch and has been wearing it.  Plan is to have her wear it for a total of 2 weeks and then she will mail it back in.  She reports that sometimes she gets a feeling of palpitations and fluttering in the heart.  Patient spent much of the time today expressing her deep sadness over the recent loss of her husband.  He collapsed and died at home over a month ago. -Since then patient states that she would rather sleep then be awake.  "And when I sleep, all I see is his eyes looking back at me from when he was dying.  I do not want to be here anymore.  I just want  to be with him." She recounts the events of his last hour before he collapsed.  She rendered CPR and feels bad that despite her efforts she was unable to save him.  She is very depressed and reports that her nerves are bad.  She feels the medications prescribed to her last month via [place] and not helping her.  These included BuSpar, Lexapro 20 mg daily and hydroxyzine 25 mg, 1-2 tabs every 6-8 hours as needed.  She finds amitriptyline helpful for insomnia.  She was referred to psychiatry.  No appointment as yet and she states that she does not feel like going a lot of places. She denies suicidal thoughts.  In regards to her other chronic medical issues including diabetes and blood pressure, she admits that she has not been taking her medications consistently since the death of her husband.  She has not been taking her insulin.  She is supposed to be on Januvia 25 mg daily, Lantus insulin 38 units daily and NovoLog 15 units with the 2 largest meals of the day. For blood pressure, she should be on carvedilol 12.5 mg twice a day, hydralazine 10 mg twice a day.  Olmesartan was d/c during hosp 11/2021 when she was admitted for acute cholecystitis.  Complains of ongoing nausea since having her gallbladder removed in May of this year.  Requesting refill on Zofran.  Patient Active Problem List   Diagnosis Date Noted   Bilateral leg edema 02/03/2022   Syncope 02/02/2022  CKD (chronic kidney disease), stage IV ([place]) 02/02/2022   Acute cholecystitis 12/21/2021   Anemia in chronic kidney disease 12/10/2020   Nephrotic range proteinuria 12/10/2020   Influenza vaccine refused 08/11/2020   Cervical myelopathy ([place]) 08/11/2020   Closed fracture of odontoid process of axis ([place]) 06/11/2019   Migraines 11/19/2018   Acute renal failure superimposed on chronic kidney disease ([place]) 10/03/2018   Morbid obesity ([place]) 07/12/2018   Muscle spasm of back 04/24/2017   Osteoarthritis of both hips 01/15/2017    Recurrent headache 01/06/2017   Irregular menstrual cycle 05/30/2016   Chronic pain syndrome 09/21/2015   HYPERTRIGLYCERIDEMIA 05/02/2009   Insomnia 05/01/2009   Idiopathic scoliosis and kyphoscoliosis 05/18/2008   DM (diabetes mellitus), type 2 with renal complications ([place]) 95/62/1308   Depression 04/18/2008   Essential hypertension, benign 04/18/2008     Current Outpatient Medications on File Prior to Visit  Medication Sig Dispense Refill   ACCU-CHEK SOFTCLIX LANCETS lancets 1 each by Other route 3 (three) times daily. ICD 10 E11.9 100 each 12   Accu-Chek Softclix Lancets lancets Use as instructed 100 each 12   acetaminophen (8 HOUR PAIN RELIEVER) 650 MG CR tablet 1 (one) tablet by mouth three times daily, as needed 90 tablet 0   amitriptyline (ELAVIL) 25 MG tablet TAKE 1 TABLET(25 MG) BY MOUTH AT BEDTIME (Patient taking differently: Take 25 mg by mouth at bedtime as needed for sleep.) 30 tablet 2   Blood Glucose Monitoring Suppl (ACCU-CHEK AVIVA PLUS) w/Device KIT 1 Device by Does not apply route 3 (three) times daily after meals. ICD 10 E11.9 1 kit 0   Blood Glucose Monitoring Suppl (ACCU-CHEK GUIDE) w/Device KIT Use as directed 1 kit 0   carvedilol (COREG) 12.5 MG tablet Take 1 tablet (12.5 mg total) by mouth 2 (two) times daily with a meal. 180 tablet 3   escitalopram (LEXAPRO) 20 MG tablet TAKE 1 TABLET(20 MG) BY MOUTH DAILY (Patient taking differently: Take 20 mg by mouth at bedtime.) 90 tablet 0   ferrous sulfate 325 (65 FE) MG tablet Take 1 tablet (325 mg total) by mouth daily with breakfast. 100 tablet 1   gabapentin (NEURONTIN) 400 MG capsule Take 1 capsule by mouth three times daily (Patient taking differently: Take 400 mg by mouth 3 (three) times daily.) 90 capsule 0   glucose blood (ACCU-CHEK AVIVA PLUS) test strip 1 each by Other route 3 (three) times daily. ICD 10 E11.9 100 each 12   glucose blood (ACCU-CHEK GUIDE) test strip Test 3x/day before meals 100 each 12    hydrALAZINE (APRESOLINE) 10 MG tablet Take 1 tablet (10 mg total) by mouth 2 (two) times daily. 60 tablet 4   hydrOXYzine (ATARAX) 25 MG tablet Take 1-2 tabs PO q6-8 hours for anxiety PRN 60 tablet 1   insulin aspart (NOVOLOG FLEXPEN) 100 UNIT/ML FlexPen INJECT 15 UNITS INTO THE SKIN WITH THE TWO LARGEST MEALS OF THE DAY. (Patient taking differently: Inject 15 Units into the skin See admin instructions. Inject 15 units into the skin with the two largest meals of the day) 15 mL 6   insulin glargine (LANTUS SOLOSTAR) 100 UNIT/ML Solostar Pen Inject 38 Units into the skin daily. (Patient taking differently: Inject 34-38 Units into the skin daily.) 15 mL 11   Insulin Pen Needle (B-D ULTRAFINE III SHORT PEN) 31G X 8 MM MISC 1 application by Does not apply route daily. 60 each 5   Lancet Devices (ACCU-CHEK SOFTCLIX) lancets 1 each by Other route 3 (  three) times daily. ICD 10 E11.9 1 each 0   naloxone (NARCAN) nasal spray 4 mg/0.1 mL Inhale 1 spray as needed. 2 each 1   norethindrone (MICRONOR) 0.35 MG tablet Take 1 tablet by mouth daily.     ondansetron (ZOFRAN-ODT) 4 MG disintegrating tablet Take 1 tablet (4 mg total) by mouth every 8 (eight) hours as needed. 20 tablet 6   Oxycodone HCl 10 MG TABS take one tablet by mouth five times daily (Patient taking differently: Take 10 mg by mouth 5 (five) times daily.) 75 tablet 0   tiZANidine (ZANAFLEX) 4 MG tablet TAKE 1 TABLET BY MOUTH EVERY 8 HOURS AS NEEDED (Patient taking differently: Take 4 mg by mouth 4 (four) times daily as needed for muscle spasms.) 30 tablet 0   Vitamin D, Ergocalciferol, (DRISDOL) 1.25 MG (50000 UNIT) CAPS capsule Take 1 capsule by mouth once a week (Patient taking differently: Take 50,000 Units by mouth every 7 (seven) days. Thursday) 4 capsule 2   sitaGLIPtin (JANUVIA) 25 MG tablet Take 1 tablet (25 mg total) by mouth daily. (Patient not taking: Reported on 02/03/2022) 30 tablet 0   No current facility-administered medications on file  prior to visit.    Allergies  Allergen Reactions   Hydrocodone Itching    Note: benadryl does NOT help   Lisinopril Nausea And Vomiting    Caused nausea   Metformin And Related Other (See Comments)    Causes blisters on skin   Norvasc [Amlodipine Besylate] Swelling    Ankle swelling   Other Other (See Comments)    Paper Tape causes Blisters   Ultrasound Gel Rash and Other (See Comments)    Patient gets a red rash with use of ultrasound gel/ burns skin    Social History   Socioeconomic History   Marital status: Married    Spouse name: Not on file   Number of children: 3   Years of education: Not on file   Highest education level: Not on file  Occupational History    Employer: UNEMPLOYED  Tobacco Use   Smoking status: Never   Smokeless tobacco: Never  Vaping Use   Vaping Use: Never used  Substance and Sexual Activity   Alcohol use: No   Drug use: No   Sexual activity: Not Currently    Birth control/protection: Other-see comments    Comment: unable to have children d/t scoliosis  Other Topics Concern   Not on file  Social History Narrative   Not on file   Social Determinants of Health   Financial Resource Strain: Low Risk  (12/27/2021)   Overall Financial Resource Strain (CARDIA)    Difficulty of Paying Living Expenses: Not hard at all  Food Insecurity: No Food Insecurity (12/27/2021)   Hunger Vital Sign    Worried About Running Out of Food in the Last Year: Never true    Ran Out of Food in the Last Year: Never true  Transportation Needs: No Transportation Needs (12/27/2021)   PRAPARE - Hydrologist (Medical): No    Lack of Transportation (Non-Medical): No  Physical Activity: Inactive (12/27/2021)   Exercise Vital Sign    Days of Exercise per Week: 0 days    Minutes of Exercise per Session: 0 min  Stress: Stress Concern Present (12/27/2021)   [place]    Feeling of Stress  : Very much  Social Connections: Socially Integrated (12/27/2021)   Social Connection and Isolation  Panel [NHANES]    Frequency of Communication with Friends and Family: More than three times a week    Frequency of Social Gatherings with Friends and Family: More than three times a week    Attends Religious Services: More than 4 times per year    Active Member of [employer] or Organizations: Yes    Attends Music therapist: More than 4 times per year    Marital Status: Married  Human resources officer Violence: Not on file    Family History  Problem Relation Age of Onset   Cancer Maternal Grandmother        mandibular cancer    Diabetes Mother    Heart failure Mother    CAD Mother        triple CABG   Diabetes Maternal Aunt    Stroke Father     Past Surgical History:  Procedure Laterality Date   BACK SURGERY     CERVICAL FUSION     CHOLECYSTECTOMY N/A 12/20/2021   Procedure: LAPAROSCOPIC CHOLECYSTECTOMY;  Surgeon: Clovis Riley, MD;  Location: [facility];  Service: General;  Laterality: N/A;   frontal closed sutures to forehead     Harrington Rod placement     HIP SURGERY     staph infection   [place] N/A 02/25/2017   Procedure: RADIOLOGY WITH ANESTHESIA/MRI THORACIC SPINE AND LEFT SHOULDER WITHOUT CONTRAST.;  Surgeon: Radiologist, Medication, MD;  Location: [facility];  Service: Radiology;  Laterality: N/A;   RADIOLOGY WITH ANESTHESIA N/A 02/04/2022   Procedure: MRI WITH ANESTHESIA;  Surgeon: Radiologist, Medication, MD;  Location: [facility];  Service: Radiology;  Laterality: N/A;    ROS: Review of Systems Negative except as stated above  PHYSICAL EXAM: BP (!) 165/96 (BP Location: Left Arm, Patient Position: Sitting, Cuff Size: Large)   Pulse 67   Temp 98.4 F (36.9 C) (Oral)   Resp 16   Ht $R'4\' 7"'FZ$  (1.397 m)   Wt 233 lb (105.7 kg)   LMP 02/01/2022 (Exact Date)   SpO2 96%   BMI 54.15 kg/m   Wt Readings from Last 3 Encounters:  02/19/22 233  lb (105.7 kg)  02/04/22 227 lb 1.2 oz (103 kg)  12/20/21 230 lb (104.3 kg)    Physical Exam  General appearance -patient in NAD.  She is very tearful throughout most of the visit. Chest - clear to auscultation, no wheezes, rales or rhonchi, symmetric air entry Heart - normal rate, regular rhythm, normal S1, S2, no murmurs, rubs, clicks or gallops.  She is wearing the Zio device.      02/19/2022    2:38 PM 02/19/2022    2:16 PM 12/27/2021   10:59 AM  Depression screen PHQ 2/9  Decreased Interest 3 0 1  Down, Depressed, Hopeless 3 0 1  PHQ - 2 Score 6 0 2  Altered sleeping 3 1   Tired, decreased energy 2 1   Change in appetite 2 0   Feeling bad or failure about yourself  1    Trouble concentrating 2 2   Moving slowly or fidgety/restless 1 0   Suicidal thoughts 1 0   PHQ-9 Score 18 4       02/19/2022    2:38 PM 02/19/2022    2:16 PM 07/12/2021    3:20 PM 06/27/2021   11:11 AM  GAD 7 : Generalized Anxiety Score  Nervous, Anxious, on Edge 3 0 2 1  Control/stop worrying 3 1  2 2  Worry too much - different things _0 Trouble relaxing _1 Restless 3 0 2 2  Easily annoyed or irritable 3 1 0 0  Afraid - awful might happen 3 0 2 3  Total GAD 7 Score _2 Latest Ref Rng & Units 02/03/2022    2:39 AM 02/02/2022   11:13 PM 02/02/2022    3:58 PM  CMP  Glucose 70 - 99 mg/dL 202  109    BUN 6 - 20 mg/dL 39  38    Creatinine 0.44 - 1.00 mg/dL 2.60  2.55    Sodium 135 - 145 mmol/L 143  140    Potassium 3.5 - 5.1 mmol/L 4.2  4.0  4.5   Chloride 98 - 111 mmol/L 114  110    CO2 22 - 32 mmol/L 23  22    Calcium 8.9 - 10.3 mg/dL 9.1  9.0    Total Protein 6.5 - 8.1 g/dL 6.5  6.8    Total Bilirubin 0.3 - 1.2 mg/dL 0.8  0.8    Alkaline Phos 38 - 126 U/L 49  48    AST 15 - 41 U/L 19  18    ALT 0 - 44 U/L 17  18     Lipid Panel     Component Value Date/Time   CHOL 198 08/11/2020 1632   TRIG 112 08/11/2020 1632   HDL 62 08/11/2020 1632   CHOLHDL 3.2 08/11/2020  1632   CHOLHDL 5.3 (H) 09/21/2015 1740   VLDL 56 (H) 09/21/2015 1740   LDLCALC 116 (H) 08/11/2020 1632    CBC    Component Value Date/Time   WBC 6.9 02/03/2022 0239   RBC 3.31 (L) 02/03/2022 0239   RBC 3.34 (L) 02/02/2022 2313   HGB 10.1 (L) 02/03/2022 0239   HGB 9.6 (L) 07/19/2021 1337   HGB 8.8 (L) 07/04/2021 1456   HCT 31.6 (L) 02/03/2022 0239   HCT 25.7 (L) 07/04/2021 1456   PLT 161 02/03/2022 0239   PLT 223 07/19/2021 1337   PLT 177 07/04/2021 1456   MCV 95.5 02/03/2022 0239   MCV 82 07/04/2021 1456   MCH 30.5 02/03/2022 0239   MCHC 32.0 02/03/2022 0239   RDW 13.0 02/03/2022 0239   RDW 13.0 07/04/2021 1456   LYMPHSABS 2.0 02/02/2022 2313   LYMPHSABS 2.5 09/15/2018 1702   MONOABS 0.4 02/02/2022 2313   EOSABS 0.2 02/02/2022 2313   EOSABS 0.2 09/15/2018 1702   BASOSABS 0.0 02/02/2022 2313   BASOSABS 0.0 09/15/2018 1702    ASSESSMENT AND PLAN:  1. Hospital discharge follow-up   2. Syncope, unspecified syncope type -Patient will wear the heart monitor for total of 2 weeks then turn it into cardiology. -I forgot to mention to the patient that the MRI that they did on the brain showed that she has had old strokes and would benefit from being on statin therapy and baby aspirin.  Message sent to her via MyChart letting her know that I have sent a prescription for atorvastatin to her pharmacy for this reason.  3. Grief reaction 4. Moderate major depression ([place]) 5. GAD (generalized anxiety disorder) -Informed patient that it is not unusual to experience a significant grief that she is having at this time due to the recent loss of her husband.  Encouraged her that it will get better with time.  Strongly recommend that she consider getting  in some counseling or joining a grief support group through hospice.  She declines for me to follow-up with the behavioral health referral that was submitted previously.  She states that the funeral home that took care of her husband remains  off of a support group and she plans to get in there.  Request that I also have our LCSW follow-up with her and she was okay with that. -In regards to her medications, I declined adding anything else right now because of polypharmacy.  Advised that she continues the Lexapro and stop the BuSpar. -Stressed the importance of trying to take care of her health by taking her medications for diabetes and blood pressure.  6. Hypertension associated with diabetes ([place]) Encourage compliance with medications which are carvedilol 12.5 mg twice a day and hydralazine 10 mg twice a day.  7. Type 2 diabetes mellitus with morbid obesity ([place]) Encourage compliance with her medications listed above.  We will send prescription to her pharmacy for Dexcom device. - Continuous Blood Gluc Sensor (DEXCOM G6 SENSOR) MISC; 1 packet by Does not apply route daily.  Dispense: 3 each; Refill: 11 - Continuous Blood Gluc Receiver (DEXCOM G6 RECEIVER) DEVI; 1 Device by Does not apply route daily.  Dispense: 1 each; Refill: 0 - atorvastatin (LIPITOR) 20 MG tablet; Take 1 tablet (20 mg total) by mouth daily.  Dispense: 30 tablet; Refill: 3  8. Nausea - ondansetron (ZOFRAN-ODT) 4 MG disintegrating tablet; Take 1 tablet (4 mg total) by mouth daily as needed.  Dispense: 20 tablet; Refill: 1  Patient indicated that she is exploring getting in with [facility] primary care that is closer to her house.  If she is unable to get in there, she will continue follow-up with [name].  Patient was given the opportunity to ask questions.  Patient verbalized understanding of the plan and was able to repeat key elements of the plan.   This documentation was completed using Radio producer.  Any transcriptional errors are unintentional.  No orders of the defined types were placed in this encounter.    Requested Prescriptions    No prescriptions requested or ordered in this encounter    Return in about 3 months (around  05/22/2022).  Karle Plumber, MD, FACP

## 2022-02-19 NOTE — Progress Notes (Unsigned)
Syncope episodes Medication refills - zofran   Heart monitor- does not know who it needs to be returned to.  High Score with PHQ-9 question 9, 1   GAD-7

## 2022-02-20 ENCOUNTER — Encounter: Payer: Self-pay | Admitting: Internal Medicine

## 2022-02-20 MED ORDER — ATORVASTATIN CALCIUM 20 MG PO TABS: 20.0000 mg | ORAL_TABLET | Freq: Every day | ORAL | 3 refills | Status: AC

## 2022-02-20 MED ORDER — ONDANSETRON 4 MG PO TBDP: 4.0000 mg | ORAL_TABLET | Freq: Every day | ORAL | 1 refills | Status: AC | PRN

## 2022-02-20 MED ORDER — DEXCOM G6 RECEIVER DEVI: 1.0000 | Freq: Every day | 0 refills | Status: DC

## 2022-02-20 MED ORDER — DEXCOM G6 SENSOR MISC: 1.0000 | Freq: Every day | 11 refills | Status: DC

## 2022-02-26 ENCOUNTER — Other Ambulatory Visit: Payer: Self-pay | Admitting: Internal Medicine

## 2022-02-26 ENCOUNTER — Other Ambulatory Visit: Payer: Self-pay | Admitting: Physician Assistant

## 2022-02-26 DIAGNOSIS — F32A Depression, unspecified: Secondary | ICD-10-CM

## 2022-02-26 DIAGNOSIS — G4700 Insomnia, unspecified: Secondary | ICD-10-CM

## 2022-02-26 NOTE — Telephone Encounter (Signed)
No previous encounters with Dorna Mai, MD.

## 2022-02-27 ENCOUNTER — Ambulatory Visit (INDEPENDENT_AMBULATORY_CARE_PROVIDER_SITE_OTHER): Payer: Medicaid Other | Admitting: Physician Assistant

## 2022-02-27 ENCOUNTER — Encounter: Payer: Self-pay | Admitting: Physician Assistant

## 2022-02-27 VITALS — Ht 60.0 in | Wt 233.0 lb

## 2022-02-27 DIAGNOSIS — M1612 Unilateral primary osteoarthritis, left hip: Secondary | ICD-10-CM

## 2022-02-27 DIAGNOSIS — Z6841 Body Mass Index (BMI) 40.0 and over, adult: Secondary | ICD-10-CM

## 2022-02-27 DIAGNOSIS — M160 Bilateral primary osteoarthritis of hip: Secondary | ICD-10-CM | POA: Diagnosis not present

## 2022-02-27 DIAGNOSIS — M1611 Unilateral primary osteoarthritis, right hip: Secondary | ICD-10-CM | POA: Diagnosis not present

## 2022-02-27 NOTE — Progress Notes (Signed)
Office Visit Note   Patient: Briana Williams           Date of Birth: 1981/01/11           MRN: [id number] Visit Date: 02/27/2022              Requested by: Ladell Pier, MD [address],  [place] [id number] PCP: Ladell Pier, MD   Assessment & Plan: Visit Diagnoses:  1. Primary osteoarthritis of left hip   2. Primary osteoarthritis of right hip     Plan: Given patient's kyphotic posture and inability bring legs out straight when in supine she is not a candidate for anterior hip replacement.  She may be a candidate for posterior hip replacement however Dr. Ninfa Linden does not perform posterior hip replacements as he was not t trained o do posterior hip replacements.  Therefore recommend she be referred to tertiary center.  She would like to be [facility] to [facility].  Also did discuss with her that she will need to lose weight in order to be a surgical candidate currently her BMI is currently around 45.  Questions were encouraged and answered by Dr. Ninfa Linden and myself.  Follow-Up Instructions: Return if symptoms worsen or fail to improve.   Orders:  No orders of the defined types were placed in this encounter.  No orders of the defined types were placed in this encounter.     Procedures: No procedures performed   Clinical Data: No additional findings.   Subjective: Chief Complaint  Patient presents with   Right Hip - Pain   Left Hip - Pain    HPI Patient is 41 year old female, seen today with bilateral hip pain.  She wants to discuss bilateral hip replacements.  She has tried injections in her hips knees for intra-articular without any relief.  She states her pain is getting worse.  Legs are giving out.  He is having significant groin pain.  Ranks her left hip pain to be 9 out of 10 on the right hip pain to be 10 out of 10.  States nothing is helping with her pain.  She has problems donning shoes and socks due to the pain.  She is on  oxycodone and temazepam for and sees pain management.  Patient someone with significant history of back surgeries and skull surgery she states over 45 surgeries in all.  She is diabetic and reports her hemoglobin A1c to be 6.4. Radiographs dated 08/02/2021 are reviewed.  No acute fracture.  Moderate right hip arthritic changes.  Left hip with joint space narrowing moderate to moderately severe.  Periarticular spurring noted.    Review of Systems See HPI otherwise negative or noncontributory.  Objective: Vital Signs: Ht 5' (1.524 m)   Wt 233 lb (105.7 kg)   LMP 02/01/2022 (Exact Date)   BMI 45.50 kg/m   Physical Exam General: Well-developed well-nourished female in no acute distress.  Ambulates slow antalgic gait without a walker. Ortho Exam Bilateral hips she has significant guarding of both hips with internal/external rotation and has severe pain with these attempts of motion.  She has minimal external rotation of both hips and virtually no internal rotation of either hip.  Dorsiflexion bilateral ankles intact.  Calves are supple and nontender bilaterally.  Laying supine she is unable to bring her legs extend her legs and any attempts passively only bring the legs within 30 degrees of touching the table.  Soft tissue planes area with  anterior hip is appropriate for surgery however the inability to get her legs out straight makes anterior hip replacement not a viable option.  Specialty Comments:  No specialty comments available.  Imaging: No results found.   PMFS History: Patient Active Problem List   Diagnosis Date Noted   Bilateral leg edema 02/03/2022   Syncope 02/02/2022   CKD (chronic kidney disease), stage IV (H[facility]) 02/02/2022   Acute cholecystitis 12/21/2021   Anemia in chronic kidney disease 12/10/2020   Nephrotic range proteinuria 12/10/2020   Influenza vaccine refused 08/11/2020   Cervical myelopathy ([place]) 08/11/2020   Closed fracture of odontoid process of axis ([place])  06/11/2019   Migraines 11/19/2018   Acute renal failure superimposed on chronic kidney disease ([place]) 10/03/2018   Morbid obesity ([place]) 07/12/2018   Muscle spasm of back 04/24/2017   Osteoarthritis of both hips 01/15/2017   Recurrent headache 01/06/2017   Irregular menstrual cycle 05/30/2016   Chronic pain syndrome 09/21/2015   HYPERTRIGLYCERIDEMIA 05/02/2009   Insomnia 05/01/2009   Idiopathic scoliosis and kyphoscoliosis 05/18/2008   DM (diabetes mellitus), type 2 with renal complications ([place]) 09/98/3382   Depression 04/18/2008   Essential hypertension, benign 04/18/2008   Past Medical History:  Diagnosis Date   Arthritis    GERD (gastroesophageal reflux disease)    Headache    migraine 2 weeks ago   Hyperlipidemia    Hypertension associated with diabetes ([place])    Renal disorder    has both kidneys, compacted and 'squashed together"   Scoliosis    Sleep apnea    in the midst of being tested for it.   waiting on scheduling    Type 2 diabetes mellitus treated with insulin ([place])    dx 2000    Family History  Problem Relation Age of Onset   Cancer Maternal Grandmother        mandibular cancer    Diabetes Mother    Heart failure Mother    CAD Mother        triple CABG   Diabetes Maternal Aunt    Stroke Father     Past Surgical History:  Procedure Laterality Date   BACK SURGERY     CERVICAL FUSION     CHOLECYSTECTOMY N/A 12/20/2021   Procedure: LAPAROSCOPIC CHOLECYSTECTOMY;  Surgeon: Clovis Riley, MD;  Location: [facility];  Service: General;  Laterality: N/A;   frontal closed sutures to forehead     Harrington Rod placement     HIP SURGERY     staph infection   LUMBAR FUSION     RADIOLOGY WITH ANESTHESIA N/A 02/25/2017   Procedure: RADIOLOGY WITH ANESTHESIA/MRI THORACIC SPINE AND LEFT SHOULDER WITHOUT CONTRAST.;  Surgeon: Radiologist, Medication, MD;  Location: [facility];  Service: Radiology;  Laterality: N/A;   RADIOLOGY WITH ANESTHESIA N/A 02/04/2022   Procedure: MRI  WITH ANESTHESIA;  Surgeon: Radiologist, Medication, MD;  Location: [facility];  Service: Radiology;  Laterality: N/A;   Social History   Occupational History    Employer: UNEMPLOYED  Tobacco Use   Smoking status: Never   Smokeless tobacco: Never  Vaping Use   Vaping Use: Never used  Substance and Sexual Activity   Alcohol use: No   Drug use: No   Sexual activity: Not Currently    Birth control/protection: Other-see comments    Comment: unable to have children d/t scoliosis

## 2022-02-28 DIAGNOSIS — R030 Elevated blood-pressure reading, without diagnosis of hypertension: Secondary | ICD-10-CM | POA: Diagnosis not present

## 2022-02-28 DIAGNOSIS — M1990 Unspecified osteoarthritis, unspecified site: Secondary | ICD-10-CM | POA: Diagnosis not present

## 2022-02-28 DIAGNOSIS — Z6841 Body Mass Index (BMI) 40.0 and over, adult: Secondary | ICD-10-CM | POA: Diagnosis not present

## 2022-02-28 DIAGNOSIS — M542 Cervicalgia: Secondary | ICD-10-CM | POA: Diagnosis not present

## 2022-02-28 DIAGNOSIS — Z79899 Other long term (current) drug therapy: Secondary | ICD-10-CM | POA: Diagnosis not present

## 2022-02-28 DIAGNOSIS — G8929 Other chronic pain: Secondary | ICD-10-CM | POA: Diagnosis not present

## 2022-02-28 DIAGNOSIS — G894 Chronic pain syndrome: Secondary | ICD-10-CM | POA: Diagnosis not present

## 2022-02-28 DIAGNOSIS — M549 Dorsalgia, unspecified: Secondary | ICD-10-CM | POA: Diagnosis not present

## 2022-02-28 DIAGNOSIS — E119 Type 2 diabetes mellitus without complications: Secondary | ICD-10-CM | POA: Diagnosis not present

## 2022-02-28 DIAGNOSIS — I10 Essential (primary) hypertension: Secondary | ICD-10-CM | POA: Diagnosis not present

## 2022-02-28 DIAGNOSIS — E559 Vitamin D deficiency, unspecified: Secondary | ICD-10-CM | POA: Diagnosis not present

## 2022-02-28 NOTE — Addendum Note (Signed)
Addended by: Robyne Peers on: 02/28/2022 03:02 PM   Modules accepted: Orders

## 2022-03-01 ENCOUNTER — Telehealth (INDEPENDENT_AMBULATORY_CARE_PROVIDER_SITE_OTHER): Payer: Self-pay | Admitting: Licensed Clinical Social Worker

## 2022-03-01 DIAGNOSIS — F411 Generalized anxiety disorder: Secondary | ICD-10-CM

## 2022-03-01 DIAGNOSIS — F321 Major depressive disorder, single episode, moderate: Secondary | ICD-10-CM

## 2022-03-01 NOTE — Telephone Encounter (Signed)
Pt was referred to [facility] by PCP for anxiety and depressed symptoms. Pt recently lost her husband and is grieving. Pt was introduced by LCSWA and discussed supportive resource by [facility]. Pt was receptive and mentioned she is attending a support group on 8/18. Pt also has an appointment scheduled with LCSWA for an initial visit on 03/06/22 virtually.  Patient stated that she needs her prescription sent to [facility] pharmacy instead of [facility]. They will not fill her prescription for her recent order. Pt stated there was so an issue with her insurance on [facility] end. Can you please send her prescription to our pharmacy downstairs?

## 2022-03-03 ENCOUNTER — Other Ambulatory Visit: Payer: Self-pay | Admitting: Internal Medicine

## 2022-03-03 DIAGNOSIS — E1169 Type 2 diabetes mellitus with other specified complication: Secondary | ICD-10-CM

## 2022-03-03 MED ORDER — DEXCOM G6 SENSOR MISC
1.0000 | Freq: Every day | 11 refills | Status: AC
  Filled 2022-03-03 – 2022-03-04 (×2): qty 3, 30d supply, fill #0

## 2022-03-03 MED ORDER — DEXCOM G6 RECEIVER DEVI
1.0000 | Freq: Every day | 0 refills | Status: AC
  Filled 2022-03-03: qty 1, 30d supply, fill #0
  Filled 2022-03-04: qty 1, 90d supply, fill #0

## 2022-03-03 NOTE — Addendum Note (Signed)
Addended by: Karle Plumber B on: 03/03/2022 04:44 PM   Modules accepted: Orders

## 2022-03-03 NOTE — Telephone Encounter (Signed)
Message from our LCSW Ms. [place] Crane was reviewed.  Phone call placed to patient today to clarify whether she needed all of her medications sent to our pharmacy or just the Zofran.  Patient states that she needs only the Dexcom prescription to be sent to our pharmacy. I informed her also that I had sent her a MyChart message after our recent visit but I see that she has not reviewed it as yet.  Patient states that she was having problems logging into MyChart account so I went over my message with her.  I informed her that the MRI of the head that she had done during her recent hospitalization showed that she may have had previous stroke.  Patient wanting to know when she may have had a stroke because she was not aware that she had 1.  I told her that the MRI is able to tell us whether it is an acute stroke that happened within a few days or subacute but other than that it cannot tell us exactly when the remote event occurred.  I recommend taking atorvastatin and a baby aspirin daily to help prevent future strokes.  Patient states she did get the prescription for the atorvastatin and is taking it and is also taking aspirin. Additionally, patient states she is still struggling mentally with the recent death of her husband.  She has made an appointment to meet with our LCSW next week.  However she states "my nerves are shot.  What else can be done."  She is on Lexapro and hydroxyzine.  I told her that I can get her to behavioral health for medication management.  Patient is finally agreeable to doing so.  I told her I will try to have them get her in as soon as possible.

## 2022-03-04 ENCOUNTER — Other Ambulatory Visit: Payer: Self-pay | Admitting: Obstetrics and Gynecology

## 2022-03-04 ENCOUNTER — Other Ambulatory Visit: Payer: Self-pay

## 2022-03-04 NOTE — Patient Outreach (Signed)
Medicaid Managed Care   Nurse Care Manager Note  03/04/2022 Name:  Briana Williams MRN:  [id number] DOB:  09/29/1980  Briana Williams is an 41 y.o. year old female who is a primary patient of Ladell Pier, MD.  The [facility] Managed Care Coordination team was consulted for assistance with:    Chronic healthcare management needs, DM, HTN, CKD, chronic pain, depression/anxiety, insomnia, scoliosis, osteoarthritis  Ms. Brightbill was given information about Medicaid Managed Care Coordination team services today. [name] Patient agreed to services and verbal consent obtained.  Engaged with patient by telephone for follow up visit in response to provider referral for case management and/or care coordination services.   Assessments/Interventions:  Review of past medical history, allergies, medications, health status, including review of consultants reports, laboratory and other test data, was performed as part of comprehensive evaluation and provision of chronic care management services.  SDOH (Social Determinants of Health) assessments and interventions performed:  Care Plan  Allergies  Allergen Reactions   Hydrocodone Itching    Note: benadryl does NOT help   Lisinopril Nausea And Vomiting    Caused nausea   Metformin And Related Other (See Comments)    Causes blisters on skin   Norvasc [Amlodipine Besylate] Swelling    Ankle swelling   Other Other (See Comments)    Paper Tape causes Blisters   Ultrasound Gel Rash and Other (See Comments)    Patient gets a red rash with use of ultrasound gel/ burns skin   Medications Reviewed Today     Reviewed by Gayla Medicus, RN (Registered Nurse) on 03/04/22 at [place] List Status: <None>   Medication Order Taking? Sig Documenting Provider Last Dose Status Informant  ACCU-CHEK SOFTCLIX LANCETS lancets [id number]  1 each by Other route 3 (three) times daily. ICD 10 E11.9 [name], MD  Active Self  Accu-Chek  Softclix Lancets lancets [id number]  Use as instructed Ladell Pier, MD  Active Self  acetaminophen (8 HOUR PAIN RELIEVER) 650 MG CR tablet [id number]  1 (one) tablet by mouth three times daily, as needed   Active Self  amitriptyline (ELAVIL) 25 MG tablet [id number]  TAKE 1 TABLET(25 MG) BY MOUTH AT BEDTIME Ladell Pier, MD  Active   aspirin EC 81 MG tablet [id number] Yes Take 81 mg by mouth daily. Swallow whole. [provider]  Active Self  atorvastatin (LIPITOR) 20 MG tablet [id number]  Take 1 tablet (20 mg total) by mouth daily. Ladell Pier, MD  Active   Blood Glucose Monitoring Suppl (ACCU-CHEK AVIVA PLUS) w/Device KIT [id number]  1 Device by Does not apply route 3 (three) times daily after meals. ICD 10 E11.9 [name], MD  Active Self  Blood Glucose Monitoring Suppl (ACCU-CHEK GUIDE) w/Device KIT [id number]  Use as directed Ladell Pier, MD  Active Self  carvedilol (COREG) 12.5 MG tablet [id number]  Take 1 tablet (12.5 mg total) by mouth 2 (two) times daily with a meal. Ladell Pier, MD  Active Self  Continuous Blood Gluc Receiver (DEXCOM G6 RECEIVER) [place] [id number]  1 Device by Does not apply route daily. Ladell Pier, MD  Active   Continuous Blood Gluc Sensor (DEXCOM G6 SENSOR) [place] [id number]  Use as directed daily. Ladell Pier, MD  Active   escitalopram (LEXAPRO) 20 MG tablet [id number]  TAKE 1 TABLET(20 MG) BY MOUTH DAILY  Patient taking differently: Take 20 mg by mouth at bedtime.   Mayers, Loraine Grip, PA-C  Active Self  gabapentin (NEURONTIN) 400 MG capsule [id number]  Take 1 capsule by mouth three times daily  Patient taking differently: Take 400 mg by mouth 3 (three) times daily.     Active Self  glucose blood (ACCU-CHEK AVIVA PLUS) test strip [id number]  1 each by Other route 3 (three) times daily. ICD 10 E11.9 [name], MD  Active Self  glucose blood (ACCU-CHEK GUIDE) test strip [id number]  Test 3x/day before meals Ladell Pier, MD  Active Self  hydrALAZINE (APRESOLINE) 10 MG tablet [id number]  Take 1 tablet (10 mg total) by mouth 2 (two) times daily. Ladell Pier, MD  Active Self  hydrOXYzine (ATARAX) 25 MG tablet [id number]  Take 1-2 tabs PO q6-8 hours for anxiety PRN Mayers, Cari S, PA-C  Active Self  insulin aspart (NOVOLOG FLEXPEN) 100 UNIT/ML FlexPen [id number]  INJECT 15 UNITS INTO THE SKIN WITH THE TWO LARGEST MEALS OF THE DAY.  Patient taking differently: Inject 15 Units into the skin See admin instructions. Inject 15 units into the skin with the two largest meals of the day   Ladell Pier, MD  Active Self  insulin glargine (LANTUS SOLOSTAR) 100 UNIT/ML Solostar Pen [id number]  Inject 38 Units into the skin daily.  Patient taking differently: Inject 34-38 Units into the skin daily.   Ladell Pier, MD  Active Self  Insulin Pen Needle (B-D ULTRAFINE III SHORT PEN) 31G X 8 MM MISC [id number]  1 application by Does not apply route daily. [name], MD  Active Self  Lancet Devices [facility]) lancets [id number]  1 each by Other route 3 (three) times daily. ICD 10 E11.9 Funches, Adriana Mccallum, MD  Active Self  naloxone [facility]) nasal spray 4 mg/0.1 mL [id number]  Inhale 1 spray as needed.   Active Self  norethindrone (MICRONOR) 0.35 MG tablet [id number]  Take 1 tablet by mouth daily. [provider]  Active Self  ondansetron (ZOFRAN-ODT) 4 MG disintegrating tablet [id number]  Take 1 tablet (4 mg total) by mouth daily as needed. Ladell Pier, MD  Active   Oxycodone HCl 10 MG TABS [id number]  take one tablet by mouth five times daily  Patient taking differently: Take 10 mg by mouth 5 (five) times daily.     Active Self  sitaGLIPtin (JANUVIA) 25 MG tablet [id number]  Take 1 tablet (25 mg total) by mouth daily.  Patient not taking: Reported on 02/03/2022   [name], MD  Expired 01/20/22 2359   tiZANidine (ZANAFLEX) 4 MG tablet [id number]  TAKE 1 TABLET BY MOUTH EVERY 8 HOURS AS  NEEDED  Patient taking differently: Take 4 mg by mouth 4 (four) times daily as needed for muscle spasms.   Ladell Pier, MD  Active Self  Vitamin D, Ergocalciferol, (DRISDOL) 1.25 MG (50000 UNIT) CAPS capsule [id number]  Take 1 capsule by mouth once a week  Patient taking differently: Take 50,000 Units by mouth every 7 (seven) days. Thursday     Active Self           Patient Active Problem List   Diagnosis Date Noted   Bilateral leg edema 02/03/2022   Syncope 02/02/2022   CKD (chronic kidney disease), stage IV (HCC) 02/02/2022   Acute cholecystitis 12/21/2021   Anemia in chronic kidney disease 12/10/2020   Nephrotic range proteinuria 12/10/2020   Influenza vaccine refused 08/11/2020   Cervical myelopathy ([place]) 08/11/2020   Closed fracture of odontoid process of axis ([place]) 06/11/2019   Migraines 11/19/2018  Acute renal failure superimposed on chronic kidney disease ([place]) 10/03/2018   Morbid obesity ([place]) 07/12/2018   Muscle spasm of back 04/24/2017   Osteoarthritis of both hips 01/15/2017   Recurrent headache 01/06/2017   Irregular menstrual cycle 05/30/2016   Chronic pain syndrome 09/21/2015   HYPERTRIGLYCERIDEMIA 05/02/2009   Insomnia 05/01/2009   Idiopathic scoliosis and kyphoscoliosis 05/18/2008   DM (diabetes mellitus), type 2 with renal complications ([place]) 05/14/5101   Depression 04/18/2008   Essential hypertension, benign 04/18/2008   Conditions to be addressed/monitored per PCP order:  Chronic healthcare management needs, DM, HTN, CKD, chronic pain, depression/anxiety, insomnia, scoliosis, osteoarthritis  Care Plan : RN Care Manager Plan of Care  Updates made by Gayla Medicus, RN since 03/04/2022 12:00 AM     Problem: Chronic Disease Management and Care Coordination Needs for DM, HTN, CKD Stage 3, Chronic Pain   Priority: High  Onset Date: 12/27/2021     Long-Range Goal: Development of Plan of Care for Chronic Disease Management and Care Coordination Needs  (DM, HTN, CKD Stage 3, Chronic Pain)   Start Date: 12/27/2021  Expected End Date: 04/26/2022  Priority: High  Note:   Current Barriers:  Knowledge Deficits related to plan of care for management of HTN, DMII, CKD Stage 3, and Chronic Pain  Care Coordination needs related to complex medication regimen.  Chronic Disease Management support and education needs related to HTN, DMII, CKD Stage 3, and Chronic Pain No Advanced Directives in place 03/04/22:  patient tearful on the phone today, grief counseling scheduled for next week with [facility].  Patient states CBG WNL-less than 130, checks BP occasionally-not sure of last reading, maybe 130/80.  Recently seen and evaluated by PCP and Ortho.  RNCM Clinical Goal(s):  Patient will verbalize understanding of plan for management of HTN, DMII, CKD Stage 3, and Chronic Pain as evidenced by improved management of these chronic diseases verbalize basic understanding of HTN, DMII, CKD Stage 3, and Chronic Pain disease process and self health management plan as evidenced by improved management of these chronic diseases. take all medications exactly as prescribed and will call provider for medication related questions as evidenced by being compliant with all medications    attend all scheduled medical appointments    demonstrate improved adherence to prescribed treatment plan for HTN, DMII, CKD Stage 3, and Chronic Pain as evidenced by improved A1C results, blood pressure readings are < 130/80 and patient's pain is managed effectively. continue to work with Consulting civil engineer and/or Social Worker to address care management and care coordination needs related to HTN, DMII, CKD Stage 3, and Chronic Pain as evidenced by adherence to CM Team Scheduled appointments     work with pharmacist to address complex medication regimen related to HTN, DMII, CKD Stage 3, and Chronic Pain as evidenced by review of EMR and patient or pharmacist report    through collaboration with RN  Care manager, provider, and care team.   Interventions: Inter-disciplinary care team collaboration (see longitudinal plan of care) Evaluation of current treatment plan related to  self management and patient's adherence to plan as established by provider  Chronic Kidney Disease (Status: New goal.)  Long Term Goal  Last practice recorded BP readings:  BP Readings from Last 3 Encounters:  12/21/21 (!) 161/71  11/13/21 (!) 195/92  11/06/21 (!) 189/92   Lab Results  Component Value Date   NA 139 12/21/2021   K 4.3 12/21/2021   CO2 17 (L) 12/21/2021   GLUCOSE 248 (  H) 12/21/2021   BUN 46 (H) 12/21/2021   CREATININE 2.82 (H) 12/21/2021   CALCIUM 8.1 (L) 12/21/2021   EGFR 36 (L) 06/27/2021   GFRNONAA 21 (L) 12/21/2021    Most recent eGFR/CrCl:  Lab Results  Component Value Date   EGFR 36 (L) 06/27/2021    No components found for: CRCL  Assessed the patient     understanding of chronic kidney disease    Evaluation of current treatment plan related to chronic kidney disease self management and patient's adherence to plan as established by provider      Reviewed medications with patient and discussed importance of compliance    Advised patient, providing education and rationale, to monitor blood pressure daily and record, calling PCP for findings outside established parameters    Reviewed scheduled/upcoming provider appointments  Discussed plans with patient for ongoing care management follow up and provided patient with direct contact information for care management team    Screening for signs and symptoms of depression related to chronic disease state      Assessed social determinant of health barriers     Diabetes:  (Status: New goal.) Long Term Goal   Lab Results  Component Value Date   HGBA1C 8.9 (A) 06/27/2021  Assessed patient's understanding of A1c goal: <7% Reviewed medications with patient and discussed importance of medication adherence;        Discussed plans with  patient for ongoing care management follow up and provided patient with direct contact information for care management team;      Reviewed scheduled/upcoming provider appointments including: See RNCM Clinical Goals above;         Advised patient, providing education and rationale, to check cbg three times daily and record        call provider for findings outside established parameters;       Referral made to pharmacy team for assistance with complex medication regimen;       Screening for signs and symptoms of depression related to chronic disease state;        Assessed social determinant of health barriers;         Hypertension: (Status: New goal.) Long Term Goal  Last practice recorded BP readings:  BP Readings from Last 3 Encounters:  12/21/21 (!) 161/71  11/13/21 (!) 195/92  11/06/21 (!) 189/92  Most recent eGFR/CrCl:  Lab Results  Component Value Date   EGFR 36 (L) 06/27/2021    No components found for: CRCL  Evaluation of current treatment plan related to hypertension self management and patient's adherence to plan as established by provider;   Reviewed prescribed diet Heart Healthy, low sodium, low carbohydrates Reviewed medications with patient and discussed importance of compliance Counseled on the importance of exercise goals with target of 150 minutes per week Discussed plans with patient for ongoing care management follow up and provided patient with direct contact information for care management team; Advised patient, providing education and rationale, to monitor blood pressure daily and record, calling PCP for findings outside established parameters;  Reviewed scheduled/upcoming provider appointments   Screening for signs and symptoms of depression related to chronic disease state Assessed social determinant of health barriers;   Pain:  (Status: New goal.) Long Term Goal  Pain assessment performed. Patient reports chronic pain in bilateral hips and legs with pain  radiating down Left leg more.  Chronic pain due to long term scoliosis and multiple back surgeries throughout her life.  Patient rates pain as a "8" on pain  scale of 0 - 10. Medications reviewed Reviewed provider established plan for pain management Discussed importance of adherence to all scheduled medical appointments Counseled on the importance of reporting any/all new or changed pain symptoms or management strategies to pain management provider Reviewed with patient prescribed pharmacological and nonpharmacological pain relief strategies Screening for signs and symptoms of depression related to chronic disease state  Assessed social determinant of health barriers.  Patient Goals/Self-Care Activities: Take medications as prescribed   Attend all scheduled provider appointments Call pharmacy for medication refills 3-7 days in advance of running out of medications Call provider office for new concerns or questions  Work with [facility] Pharmacist regarding complex medication regimen check blood sugar at prescribed times: three times daily take the blood sugar meter to all doctor visits check blood pressure 3 times per week take blood pressure log to all doctor appointments call doctor for signs and symptoms of high blood pressure limit salt intake to 2000 mg/day   Long-Range Goal: Establish Plan of Care for Chronic Disease Management Needs   Priority: High  Note:   Timeframe:  Long-Range Goal Priority:  High Start Date:     03/04/22                        Expected End Date:    ongoing                   Follow Up Date 04/12/22    - schedule appointment for vaccines needed due to my age or health - schedule recommended health tests (blood work, mammogram, colonoscopy, pap test) - schedule and keep appointment for annual check-up    Why is this important?   Screening tests can find diseases early when they are easier to treat.  Your doctor or nurse will talk with you about which tests are  important for you.  Getting shots for common diseases like the flu and shingles will help prevent them.      Follow Up:  Patient agrees to Care Plan and Follow-up.  Plan: The Managed Medicaid care management team will reach out to the patient again over the next 30 business  days. and The  Patient has been provided with contact information for the Managed Medicaid care management team and has been advised to call with any health related questions or concerns.  Date/time of next scheduled RN care management/care coordination outreach: 04/11/22 at 330.

## 2022-03-04 NOTE — Patient Instructions (Signed)
Hi Ms. Berkheimer, thanks for speaking with me today!  Have a nice afternoon.  Ms. Louison was given information about Medicaid Managed Care team care coordination services as a part of their [employer] Medicaid benefit. Parneet M Helfand verbally consented to engagement with the Medicaid Managed Care team.   If you are experiencing a medical emergency, please call 911 or report to your local emergency department or urgent care.   If you have a non-emergency medical problem during routine business hours, please contact your provider's office and ask to speak with a nurse.   For questions related to your [employer] Medicaid health plan, please call: [phone] or visit the homepage here: [email or web address]  If you would like to schedule transportation through your Healthy [employer] Medicaid plan, please call the following number at least 2 days in advance of your appointment: [phone]  For information about your ride after you set it up, call Ride Assist at [phone]. Use this number to activate a Will Call pickup, or if your transportation is late for a scheduled pickup. Use this number, too, if you need to make a change or cancel a previously scheduled reservation.  If you need transportation services right away, call [phone]. The after-hours call center is staffed 24 hours to handle ride assistance and urgent reservation requests (including discharges) 365 days a year. Urgent trips include sick visits, hospital discharge requests and life-sustaining treatment.  Call the Behavioral Health Crisis Line at [phone], at any time, 24 hours a day, 7 days a week. If you are in danger or need immediate medical attention call 911.  If you would like help to quit smoking, call 1-800-QUIT-NOW (1-800-784-8669) OR Espaol: 1-855-Djelo-Ya (1-855-335-3569) o para ms informacin haga clic aqu or Text READY to 200-400 to register via text  Ms.  Skeels - following are the goals we discussed in your visit today:   Goals Addressed    Timeframe:  Long-Range Goal Priority:  High Start Date:     03/04/22                        Expected End Date:    ongoing                   Follow Up Date 04/12/22    - schedule appointment for vaccines needed due to my age or health - schedule recommended health tests (blood work, mammogram, colonoscopy, pap test) - schedule and keep appointment for annual check-up    Why is this important?   Screening tests can find diseases early when they are easier to treat.  Your doctor or nurse will talk with you about which tests are important for you.  Getting shots for common diseases like the flu and shingles will help prevent them.     Patient verbalizes understanding of instructions and care plan provided today and agrees to view in MyChart. Active MyChart status and patient understanding of how to access instructions and care plan via MyChart confirmed with patient.     The Managed Medicaid care management team will reach out to the patient again over the next 30 business  days.  The  Patient has been provided with contact information for the Managed Medicaid care management team and has been advised to call with any health related questions or concerns.   Terri Craft RN, BSN [place]  [facility] Care Management Coordinator - Managed Medicaid High Risk [phone]   Following is a copy   of your plan of care:  Care Plan : RN Care Manager Plan of Care  Updates made by Craft, Terri G, RN since 03/04/2022 12:00 AM     Problem: Chronic Disease Management and Care Coordination Needs for DM, HTN, CKD Stage 3, Chronic Pain   Priority: High  Onset Date: 12/27/2021     Long-Range Goal: Development of Plan of Care for Chronic Disease Management and Care Coordination Needs (DM, HTN, CKD Stage 3, Chronic Pain)   Start Date: 12/27/2021  Expected End Date: 04/26/2022  Priority: High  Note:    Current Barriers:  Knowledge Deficits related to plan of care for management of HTN, DMII, CKD Stage 3, and Chronic Pain  Care Coordination needs related to complex medication regimen.  Chronic Disease Management support and education needs related to HTN, DMII, CKD Stage 3, and Chronic Pain No Advanced Directives in place 03/04/22:  patient tearful on the phone today, grief counseling scheduled for next week with [facility].  Patient states CBG WNL-less than 130, checks BP occasionally-not sure of last reading, maybe 130/80.  Recently seen and evaluated by PCP and Ortho.  RNCM Clinical Goal(s):  Patient will verbalize understanding of plan for management of HTN, DMII, CKD Stage 3, and Chronic Pain as evidenced by improved management of these chronic diseases verbalize basic understanding of HTN, DMII, CKD Stage 3, and Chronic Pain disease process and self health management plan as evidenced by improved management of these chronic diseases. take all medications exactly as prescribed and will call provider for medication related questions as evidenced by being compliant with all medications    attend all scheduled medical appointments    demonstrate improved adherence to prescribed treatment plan for HTN, DMII, CKD Stage 3, and Chronic Pain as evidenced by improved A1C results, blood pressure readings are < 130/80 and patient's pain is managed effectively. continue to work with RN Care Manager and/or Social Worker to address care management and care coordination needs related to HTN, DMII, CKD Stage 3, and Chronic Pain as evidenced by adherence to CM Team Scheduled appointments     work with pharmacist to address complex medication regimen related to HTN, DMII, CKD Stage 3, and Chronic Pain as evidenced by review of EMR and patient or pharmacist report    through collaboration with RN Care manager, provider, and care team.   Interventions: Inter-disciplinary care team collaboration (see longitudinal  plan of care) Evaluation of current treatment plan related to  self management and patient's adherence to plan as established by provider  Chronic Kidney Disease (Status: New goal.)  Long Term Goal  Last practice recorded BP readings:  BP Readings from Last 3 Encounters:  12/21/21 (!) 161/71  11/13/21 (!) 195/92  11/06/21 (!) 189/92   Lab Results  Component Value Date   NA 139 12/21/2021   K 4.3 12/21/2021   CO2 17 (L) 12/21/2021   GLUCOSE 248 (H) 12/21/2021   BUN 46 (H) 12/21/2021   CREATININE 2.82 (H) 12/21/2021   CALCIUM 8.1 (L) 12/21/2021   EGFR 36 (L) 06/27/2021   GFRNONAA 21 (L) 12/21/2021    Most recent eGFR/CrCl:  Lab Results  Component Value Date   EGFR 36 (L) 06/27/2021    No components found for: CRCL  Assessed the patient     understanding of chronic kidney disease    Evaluation of current treatment plan related to chronic kidney disease self management and patient's adherence to plan as established by provider      Reviewed   medications with patient and discussed importance of compliance    Advised patient, providing education and rationale, to monitor blood pressure daily and record, calling PCP for findings outside established parameters    Reviewed scheduled/upcoming provider appointments  Discussed plans with patient for ongoing care management follow up and provided patient with direct contact information for care management team    Screening for signs and symptoms of depression related to chronic disease state      Assessed social determinant of health barriers     Diabetes:  (Status: New goal.) Long Term Goal   Lab Results  Component Value Date   HGBA1C 8.9 (A) 06/27/2021  Assessed patient's understanding of A1c goal: <7% Reviewed medications with patient and discussed importance of medication adherence;        Discussed plans with patient for ongoing care management follow up and provided patient with direct contact information for care management team;       Reviewed scheduled/upcoming provider appointments including: See RNCM Clinical Goals above;         Advised patient, providing education and rationale, to check cbg three times daily and record        call provider for findings outside established parameters;       Referral made to pharmacy team for assistance with complex medication regimen;       Screening for signs and symptoms of depression related to chronic disease state;        Assessed social determinant of health barriers;         Hypertension: (Status: New goal.) Long Term Goal  Last practice recorded BP readings:  BP Readings from Last 3 Encounters:  12/21/21 (!) 161/71  11/13/21 (!) 195/92  11/06/21 (!) 189/92  Most recent eGFR/CrCl:  Lab Results  Component Value Date   EGFR 36 (L) 06/27/2021    No components found for: CRCL  Evaluation of current treatment plan related to hypertension self management and patient's adherence to plan as established by provider;   Reviewed prescribed diet Heart Healthy, low sodium, low carbohydrates Reviewed medications with patient and discussed importance of compliance Counseled on the importance of exercise goals with target of 150 minutes per week Discussed plans with patient for ongoing care management follow up and provided patient with direct contact information for care management team; Advised patient, providing education and rationale, to monitor blood pressure daily and record, calling PCP for findings outside established parameters;  Reviewed scheduled/upcoming provider appointments   Screening for signs and symptoms of depression related to chronic disease state Assessed social determinant of health barriers;   Pain:  (Status: New goal.) Long Term Goal  Pain assessment performed. Patient reports chronic pain in bilateral hips and legs with pain radiating down Left leg more.  Chronic pain due to long term scoliosis and multiple back surgeries throughout her life.  Patient  rates pain as a "8" on pain scale of 0 - 10. Medications reviewed Reviewed provider established plan for pain management Discussed importance of adherence to all scheduled medical appointments Counseled on the importance of reporting any/all new or changed pain symptoms or management strategies to pain management provider Reviewed with patient prescribed pharmacological and nonpharmacological pain relief strategies Screening for signs and symptoms of depression related to chronic disease state  Assessed social determinant of health barriers.  Patient Goals/Self-Care Activities: Take medications as prescribed   Attend all scheduled provider appointments Call pharmacy for medication refills 3-7 days in advance of running out of medications Call provider   office for new concerns or questions  Work with [facility] Pharmacist regarding complex medication regimen check blood sugar at prescribed times: three times daily take the blood sugar meter to all doctor visits check blood pressure 3 times per week take blood pressure log to all doctor appointments call doctor for signs and symptoms of high blood pressure limit salt intake to 2000 mg/day      

## 2022-03-05 DIAGNOSIS — N1832 Chronic kidney disease, stage 3b: Secondary | ICD-10-CM | POA: Diagnosis not present

## 2022-03-06 ENCOUNTER — Telehealth: Payer: Self-pay | Admitting: Internal Medicine

## 2022-03-06 ENCOUNTER — Ambulatory Visit: Payer: Medicaid Other | Attending: Licensed Clinical Social Worker | Admitting: Licensed Clinical Social Worker

## 2022-03-06 DIAGNOSIS — F4329 Adjustment disorder with other symptoms: Secondary | ICD-10-CM | POA: Diagnosis not present

## 2022-03-06 DIAGNOSIS — F4325 Adjustment disorder with mixed disturbance of emotions and conduct: Secondary | ICD-10-CM

## 2022-03-06 NOTE — Telephone Encounter (Signed)
Pt had an appt with Social worker Rosana Hoes?) @ 1:30  8/9, today  and said she had an incoming call and ask SW to hold and when she got back to call the SW was not on line, PT is requesting a fu to finish her call, [phone]

## 2022-03-06 NOTE — Telephone Encounter (Signed)
-----   Message from Ena Dawley sent at 03/04/2022 10:55 AM EDT ----- Regarding: RE: Psychiatry Referral ASAP Gm    Sent referral to Dr Corena [facility]  Ph. [phone] FAX # [phone] Address [place], [place], [place] [address] They will contact the patient to schedule an appointment    ----- Message ----- From: Ladell Pier, MD Sent: 03/03/2022   4:44 PM EDT To: Ena Dawley Subject: Psychiatry Referral ASAP                       Please try to get her in with [facility] or [facility] for medication management ASAP

## 2022-03-07 NOTE — Telephone Encounter (Signed)
LCSWA called pt back several times and With no answer. Will follow up.

## 2022-03-11 ENCOUNTER — Other Ambulatory Visit: Payer: Self-pay

## 2022-03-11 DIAGNOSIS — I129 Hypertensive chronic kidney disease with stage 1 through stage 4 chronic kidney disease, or unspecified chronic kidney disease: Secondary | ICD-10-CM | POA: Diagnosis not present

## 2022-03-11 DIAGNOSIS — D631 Anemia in chronic kidney disease: Secondary | ICD-10-CM | POA: Diagnosis not present

## 2022-03-11 DIAGNOSIS — N2581 Secondary hyperparathyroidism of renal origin: Secondary | ICD-10-CM | POA: Diagnosis not present

## 2022-03-11 DIAGNOSIS — N184 Chronic kidney disease, stage 4 (severe): Secondary | ICD-10-CM | POA: Diagnosis not present

## 2022-03-11 NOTE — BH Specialist Note (Unsigned)
Integrated Behavioral Health via Telemedicine Visit  03/06/2022 ALESIA OSHIELDS [id number]  Number of [place] Clinician visits: 1st visit Session Start time: No data recorded  Session End time: No data recorded Total time in minutes: No data recorded  Referring Provider: *** Patient/Family location: *** [facility] Provider location: *** All persons participating in visit: *** Types of Service: Individual psychotherapy and Telephone visit  I connected with [name] via  Telephone and verified that I am speaking with the correct person using two identifiers. Discussed confidentiality: {YES/NO:21197}  I discussed the limitations of telemedicine and the availability of in person appointments.  Discussed there is a possibility of technology failure and discussed alternative modes of communication if that failure occurs.  I discussed that engaging in this telemedicine visit, they consent to the provision of behavioral healthcare and the services will be billed under their insurance.  Patient and/or legal guardian expressed understanding and consented to Telemedicine visit: {YES/NO:21197}  Presenting Concerns: Patient and/or family reports the following symptoms/concerns: *** Duration of problem: ***; Severity of problem: {Mild/Moderate/Severe:20260}  Patient and/or Family's Strengths/Protective Factors: {CHL AMB BH PROTECTIVE [phone]}  Goals Addressed: Patient will:  Reduce symptoms of: {IBH Symptoms:21014056}   Increase knowledge and/or ability of: {IBH Patient Tools:21014057}   Demonstrate ability to: {IBH Goals:21014053}  Progress towards Goals: {CHL AMB BH PROGRESS TOWARDS [phone]}  Interventions: Interventions utilized:  {IBH Interventions:21014054} Standardized Assessments completed: {IBH Screening Tools:21014051}  Patient and/or Family Response: ***  Assessment: Patient currently experiencing ***.   Patient may  benefit from ***.  Plan: Follow up with behavioral health clinician on : *** Behavioral recommendations: *** Referral(s): {IBH Referrals:21014055}  I discussed the assessment and treatment plan with the patient and/or parent/guardian. They were provided an opportunity to ask questions and all were answered. They agreed with the plan and demonstrated an understanding of the instructions.   They were advised to call back or seek an in-person evaluation if the symptoms worsen or if the condition fails to improve as anticipated.  Shann Medal, LCSW

## 2022-03-13 ENCOUNTER — Ambulatory Visit: Payer: Medicaid Other | Admitting: General Practice

## 2022-03-18 ENCOUNTER — Other Ambulatory Visit: Payer: Self-pay

## 2022-03-19 ENCOUNTER — Telehealth (INDEPENDENT_AMBULATORY_CARE_PROVIDER_SITE_OTHER): Payer: Medicaid Other | Admitting: Psychiatry

## 2022-03-19 ENCOUNTER — Encounter (HOSPITAL_COMMUNITY): Payer: Self-pay | Admitting: Psychiatry

## 2022-03-19 DIAGNOSIS — F332 Major depressive disorder, recurrent severe without psychotic features: Secondary | ICD-10-CM

## 2022-03-19 DIAGNOSIS — F4321 Adjustment disorder with depressed mood: Secondary | ICD-10-CM | POA: Diagnosis not present

## 2022-03-19 DIAGNOSIS — G4700 Insomnia, unspecified: Secondary | ICD-10-CM

## 2022-03-19 DIAGNOSIS — F439 Reaction to severe stress, unspecified: Secondary | ICD-10-CM

## 2022-03-19 MED ORDER — PRAZOSIN HCL 1 MG PO CAPS: ORAL_CAPSULE | ORAL | 0 refills | Status: AC

## 2022-03-19 NOTE — Progress Notes (Signed)
Psychiatric Initial Adult Assessment  Patient Identification: Briana Williams MRN:  [id number] Date of Evaluation:  03/19/2022 Referral Source: Karle Plumber, MD (PCP)  Assessment:  Briana Williams (prefers to go by "Briana Williams") is a 41 y.o. y.o. female with a history of depression, anxiety, insomnia, chronic pain on opioid medication, T2DM, HLD, CKD IV, and HTN who presents to [place] via video conferencing for initial evaluation of depression, anxiety, and insomnia in setting of sudden and unexpected passing of her husband 2 months ago. Patient was witness to husband experiencing cardiac event and administered CPR with inability to resuscitate. She endorses recurrent and intrusive distressing memories of the event, nightmares, feelings of blame, decreased experience of positive emotions, and significant sleep disturbance that is consistent with trauma and stressor related disorder, high concern for PTSD (further evaluation needed to assess for avoidance behaviors). Additionally, patient carries history of MDD and meets criteria for recurrence as she notes depressive symptoms were relatively well controlled prior to recent events. Although she endorses thoughts of wanting to be reunited with her husband, this is felt to be within the spectrum of a normal grief reaction and there is no acute safety concern during visit today.   Education provided regarding grief reaction and symptoms in context of recent trauma. Discussed options for management including grief support groups, psychotherapy, and medications. She is amenable to referral to therapy and expresses desire to prioritize medication to target sleep disturbance and nightmares; will plan to initiate trial of prazosin at this time.   Plan:  # MDD, recurrent, severe  Acute grief  Past medication trials: Zoloft (up to 100 mg daily), Buspar (up to 10 mg TID) Status of problem: exacerbation Interventions: --  Continue Lexapro 20 mg daily (prescribed by PCP)   -- Could consider cross titration to alternative SSRI or SNRI if sx do not improve with below psychosocial support -- Was seen for individual psychotherapy by Rosana Hoes, LCSW via integrated behavioral health; will reach out to our clinic manager to determine availability of individual therapy at [facility] -- Encouraged to attend grief support group offered through [facility]  # Trauma and stressor-related disorder, r/o PTSD  Anxiety Status of problem: new problem to this provider Interventions: -- START Prazosin 1 mg nightly x1 week then increase to 2 mg nightly if tolerating well  -- Risks, benefits, and side effects discussed including but not limited to decreased BP, dizziness, drowsiness, HA, nausea, and constipation.  -- Continue Lexapro as above   # Insomnia Past medication trials: Restoril, Ambien, Xanax Status of problem: worsening Interventions: -- START Prazosin as above -- Continue amitriptyline 25 mg nightly (prescribed by PCP) -- Concern for OSA previously noted in chart; unclear if sleep study was ever obtained. Plan to further evaluate at future visit.  # Medical considerations -- Requires renal dosing of indicated medications.  -- Patient with CKD IIIb/IV. Followed by Deirdre Pippins, MD at [facility]; last seen 03/15/22.   Patient was given contact information for behavioral health clinic and was instructed to call 911 for emergencies.   Subjective:  Chief Complaint:  Chief Complaint  Patient presents with   Grief   Nightmares   History of Present Illness:   Patient is initially seen in the car (not driving) accompanied by best friend and godson; discussed limitations of privacy and patient amenable to continuing visit. Later in visit, patient sitting in the mall - again discussed limitations of privacy and patient provided consent to  continue with visit. Discussed importance of  ensuring secure, private place for next meeting and she expressed understanding.   She shares that her husband passed away unexpectedly in 01/28/22 due to MI. She was present and administered CPR. Experiences guilt re inability to save him and constantly replays the events. Notes difficulty sleeping as she can hear him crying and feel him gasping for air and see him on the floor. Endorses intrusive memories of the event numerous times a day and at night as well as nightmares that wake her from sleep. Has not improved since initial event. Elavil used to be helpful for sleep but less effective now. Endorses no energy and takes significant motivation to get out of bed. Notes feeling depressed most of the day and "empty" - unsure of who she is without her husband. Endorses decreased appetite with 70 lb weight loss since his passing. Denies intentional restricting, binging, purging behaviors.   Endorses frequent panic attacks characterized by SOB, diaphoresis, crying; lasts about 20-30 minutes. Relieving factors include her dog.  Denies AVH, HI.   Denies overt SI although endorses thoughts of wanting to be with her husband; denies thoughts of wanting to hurt herself. Denies history of SAs or self harm.   She has limited family support in the area other than MiL, best friend, niece and nephew, adopted son who still lives with her. Has 3 adopted children which has also served as point of stress.   Met with a therapist once via phone but no plans for f/u. Expresses ambivalence about therapy as she is not a "big talker" and previously husband was main support. May be interested in grief support groups if she goes with her MiL and has resources.   Feels that depression and anxiety (initially diagnosed in her 34s) were well controlled prior to husband's passing. Reviewed and confirmed medications.   Past Psychiatric History: depression, anxiety, insomnia  Previous Psychotropic Medications: Yes ; see  above  Substance Abuse History in the last 12 months:  No. Denies use of etoh; denies use of tobacco or illicit drugs  Consequences of Substance Abuse: NA  Past Medical History:  Past Medical History:  Diagnosis Date   Anxiety    Arthritis    Depression    GERD (gastroesophageal reflux disease)    Headache    migraine 2 weeks ago   Hyperlipidemia    Hypertension associated with diabetes ([place])    Renal disorder    has both kidneys, compacted and 'squashed together"   Scoliosis    Sleep apnea    in the midst of being tested for it.   waiting on scheduling    Type 2 diabetes mellitus treated with insulin ([place])    dx 2000    Past Surgical History:  Procedure Laterality Date   BACK SURGERY     CERVICAL FUSION     CHOLECYSTECTOMY N/A 12/20/2021   Procedure: LAPAROSCOPIC CHOLECYSTECTOMY;  Surgeon: Clovis Riley, MD;  Location: [place];  Service: General;  Laterality: N/A;   frontal closed sutures to forehead     [place] Rod placement     HIP SURGERY     staph infection   [place] N/A 02/25/2017   Procedure: RADIOLOGY WITH ANESTHESIA/MRI THORACIC SPINE AND LEFT SHOULDER WITHOUT CONTRAST.;  Surgeon: Radiologist, Medication, MD;  Location: [place];  Service: Radiology;  Laterality: N/A;   RADIOLOGY WITH ANESTHESIA N/A 02/04/2022   Procedure: MRI WITH ANESTHESIA;  Surgeon:  Radiologist, Medication, MD;  Location: [place];  Service: Radiology;  Laterality: N/A;    Family Psychiatric History: denies family history of suicide or psychiatric conditions  Family History:  Family History  Problem Relation Age of Onset   Cancer Maternal Grandmother        mandibular cancer    Diabetes Mother    Heart failure Mother    CAD Mother        triple CABG   Diabetes Maternal Aunt    Stroke Father     Social History:   Social History   Socioeconomic History   Marital status: Widowed    Spouse name: Not on file   Number of children: 3   Years of  education: Not on file   Highest education level: Not on file  Occupational History    Employer: UNEMPLOYED  Tobacco Use   Smoking status: Never   Smokeless tobacco: Never  Vaping Use   Vaping Use: Never used  Substance and Sexual Activity   Alcohol use: No   Drug use: No   Sexual activity: Not Currently    Birth control/protection: Other-see comments    Comment: unable to have children d/t scoliosis  Other Topics Concern   Not on file  Social History Narrative   Not on file   Social Determinants of Health   Financial Resource Strain: Low Risk  (12/27/2021)   Overall Financial Resource Strain (CARDIA)    Difficulty of Paying Living Expenses: Not hard at all  Food Insecurity: No Food Insecurity (12/27/2021)   Hunger Vital Sign    Worried About Running Out of Food in the Last Year: Never true    [place] in the Last Year: Never true  Transportation Needs: No Transportation Needs (12/27/2021)   PRAPARE - Hydrologist (Medical): No    Lack of Transportation (Non-Medical): No  Physical Activity: Inactive (12/27/2021)   Exercise Vital Sign    Days of Exercise per Week: 0 days    Minutes of Exercise per Session: 0 min  Stress: Stress Concern Present (12/27/2021)   [place]    Feeling of Stress : Very much  Social Connections: Socially Integrated (12/27/2021)   Social Connection and Isolation Panel [NHANES]    Frequency of Communication with Friends and Family: More than three times a week    Frequency of Social Gatherings with Friends and Family: More than three times a week    Attends Religious Services: More than 4 times per year    Active Member of [employer] or Organizations: Yes    Attends Music therapist: More than 4 times per year    Marital Status: Married    Additional Social History: see above HPI  Allergies:   Allergies  Allergen Reactions   Hydrocodone  Itching    Note: benadryl does NOT help   Lisinopril Nausea And Vomiting    Caused nausea   Metformin And Related Other (See Comments)    Causes blisters on skin   Norvasc [Amlodipine Besylate] Swelling    Ankle swelling   Other Other (See Comments)    Paper Tape causes Blisters   Ultrasound Gel Rash and Other (See Comments)    Patient gets a red rash with use of ultrasound gel/ burns skin    Current Medications: Current Outpatient Medications  Medication Sig Dispense Refill   amitriptyline (ELAVIL) 25 MG tablet TAKE 1 TABLET(25 MG) BY  MOUTH AT BEDTIME 90 tablet 0   aspirin EC 81 MG tablet Take 81 mg by mouth daily. Swallow whole.     atorvastatin (LIPITOR) 20 MG tablet Take 1 tablet (20 mg total) by mouth daily. 30 tablet 3   carvedilol (COREG) 12.5 MG tablet Take 1 tablet (12.5 mg total) by mouth 2 (two) times daily with a meal. 180 tablet 3   escitalopram (LEXAPRO) 20 MG tablet TAKE 1 TABLET(20 MG) BY MOUTH DAILY (Patient taking differently: Take 20 mg by mouth at bedtime.) 90 tablet 0   gabapentin (NEURONTIN) 400 MG capsule Take 1 capsule by mouth three times daily (Patient taking differently: Take 400 mg by mouth 3 (three) times daily.) 90 capsule 0   hydrALAZINE (APRESOLINE) 10 MG tablet Take 1 tablet (10 mg total) by mouth 2 (two) times daily. 60 tablet 4   insulin glargine (LANTUS SOLOSTAR) 100 UNIT/ML Solostar Pen Inject 38 Units into the skin daily. (Patient taking differently: Inject 34-38 Units into the skin daily.) 15 mL 11   prazosin (MINIPRESS) 1 MG capsule Take 1 capsule (1 mg total) nightly for 7 days then INCREASE to 2 capsules (2 mg total) nightly thereafter. 50 capsule 0   ACCU-CHEK SOFTCLIX LANCETS lancets 1 each by Other route 3 (three) times daily. ICD 10 E11.9 100 each 12   Accu-Chek Softclix Lancets lancets Use as instructed 100 each 12   acetaminophen (8 HOUR PAIN RELIEVER) 650 MG CR tablet 1 (one) tablet by mouth three times daily, as needed 90 tablet 0   Blood  Glucose Monitoring Suppl (ACCU-CHEK AVIVA PLUS) w/Device KIT 1 Device by Does not apply route 3 (three) times daily after meals. ICD 10 E11.9 1 kit 0   Blood Glucose Monitoring Suppl (ACCU-CHEK GUIDE) w/Device KIT Use as directed 1 kit 0   Continuous Blood Gluc Receiver (DEXCOM G6 RECEIVER) DEVI 1 Device by Does not apply route daily. 1 each 0   Continuous Blood Gluc Sensor (DEXCOM G6 SENSOR) MISC Use as directed daily. 3 each 11   glucose blood (ACCU-CHEK AVIVA PLUS) test strip 1 each by Other route 3 (three) times daily. ICD 10 E11.9 100 each 12   glucose blood (ACCU-CHEK GUIDE) test strip Test 3x/day before meals 100 each 12   hydrOXYzine (ATARAX) 25 MG tablet Take 1-2 tabs PO q6-8 hours for anxiety PRN (Patient not taking: Reported on 03/19/2022) 60 tablet 1   insulin aspart (NOVOLOG FLEXPEN) 100 UNIT/ML FlexPen INJECT 15 UNITS INTO THE SKIN WITH THE TWO LARGEST MEALS OF THE DAY. (Patient taking differently: Inject 15 Units into the skin See admin instructions. Inject 15 units into the skin with the two largest meals of the day) 15 mL 6   Insulin Pen Needle (B-D ULTRAFINE III SHORT PEN) 31G X 8 MM MISC 1 application by Does not apply route daily. 60 each 5   Lancet Devices (ACCU-CHEK SOFTCLIX) lancets 1 each by Other route 3 (three) times daily. ICD 10 E11.9 1 each 0   naloxone (NARCAN) nasal spray 4 mg/0.1 mL Inhale 1 spray as needed. 2 each 1   norethindrone (MICRONOR) 0.35 MG tablet Take 1 tablet by mouth daily.     ondansetron (ZOFRAN-ODT) 4 MG disintegrating tablet Take 1 tablet (4 mg total) by mouth daily as needed. 20 tablet 1   Oxycodone HCl 10 MG TABS take one tablet by mouth five times daily (Patient taking differently: Take 10 mg by mouth 5 (five) times daily.) 75 tablet 0   tiZANidine (ZANAFLEX)  4 MG tablet TAKE 1 TABLET BY MOUTH EVERY 8 HOURS AS NEEDED (Patient taking differently: Take 4 mg by mouth 4 (four) times daily as needed for muscle spasms.) 30 tablet 0   Vitamin D,  Ergocalciferol, (DRISDOL) 1.25 MG (50000 UNIT) CAPS capsule Take 1 capsule by mouth once a week (Patient taking differently: Take 50,000 Units by mouth every 7 (seven) days. Thursday) 4 capsule 2   No current facility-administered medications for this visit.    ROS: Review of Systems  Constitutional:  Positive for appetite change, fatigue and unexpected weight change.  Psychiatric/Behavioral:  Positive for decreased concentration, dysphoric mood and sleep disturbance. The patient is nervous/anxious.    Objective:  Psychiatric Specialty Exam: Last menstrual period 02/01/2022.There is no height or weight on file to calculate BMI.  General Appearance: Fairly Groomed  Eye Contact:  Good  Speech:  Clear and Coherent and Normal Rate  Volume:  Normal  Mood:  Depressed  Affect:  Appropriate, Depressed, and Tearful  Thought Process:  Coherent  Orientation:  Full (Time, Place, and Person)  Thought Content:  Logical and Rumination on events surrounding husband's death  Suicidal Thoughts:  No although endorses thoughts of wanting to be with her husband.   Homicidal Thoughts:  No  Memory:  Recent;   Good Remote;   Fair - has difficulty recalling specifics of past medication trials  Judgment:  Intact  Insight:   Intact  Psychomotor Activity:  Normal  Concentration:  Concentration: Good  Recall:  NA  Fund of Knowledge:Good  Language: Good  Akathisia:  NA  Handed:  not asked  AIMS (if indicated):  not done  Assets:  Desire for Improvement Housing Social Support Transportation  ADL's:  Recent decline in performing ADLs  Cognition: WNL  Sleep:  Poor   PE: General: sits comfortably in view of camera; tearful Pulm: no increased work of breathing on room air  MSK: all extremity movements appear intact  Neuro: no focal neurological deficits observed  Gait & Station: unable to assess by video    Metabolic Disorder Labs: Lab Results  Component Value Date   HGBA1C 6.8 (H) 02/02/2022    MPG 148.46 02/02/2022   No results found for: "PROLACTIN" Lab Results  Component Value Date   CHOL 198 08/11/2020   TRIG 112 08/11/2020   HDL 62 08/11/2020   CHOLHDL 3.2 08/11/2020   VLDL 56 (H) 09/21/2015   LDLCALC 116 (H) 08/11/2020   LDLCALC 148 (H) 02/06/2018   Lab Results  Component Value Date   TSH 2.713 02/02/2022   Lab Results  Component Value Date   CREATININE 2.60 (H) 02/03/2022   CREATININE 2.55 (H) 02/02/2022   CREATININE 2.55 (H) 02/02/2022  Review of media tab, progress note from [place]: Cr 2.26; eGFR 27 on 03/05/22   Therapeutic Level Labs: No results found for: "LITHIUM" No results found for: "CBMZ" No results found for: "VALPROATE"  Screenings:  GAD-7    Flowsheet Row Office Visit from 02/19/2022 in [place] Office Visit from 07/12/2021 in [place] Office Visit from 06/27/2021 in [place] Office Visit from 08/11/2020 in [place] Office Visit from 10/21/2019 in [place]  Total GAD-7 Score 21 14 12 9 11       PHQ2-9    Flowsheet Row Video Visit from 03/19/2022 in [facility] Office Visit from 02/19/2022 in  [place] Patient Outreach Telephone from 12/27/2021 in [place] Coordination Procedure visit from 07/04/2021 in [place] Visit from 06/27/2021 in [place]  PHQ-2 Total Score 6 6 2  0 2  PHQ-9 Total Score 22 18 -- -- 10      Flowsheet Row Video Visit from 03/19/2022 in [facility] ED to Hosp-Admission (Discharged) from 02/02/2022 in [place] ED to Hosp-Admission (Discharged) from 12/17/2021 in [place] PCU  C-SSRS RISK CATEGORY No Risk No Risk  No Risk       Collaboration of Care: Collaboration of Care: Other no direct collaboration of care provided during this visit  Patient/Guardian was advised Release of Information must be obtained prior to any record release in order to collaborate their care with an outside provider. Patient/Guardian was advised if they have not already done so to contact the registration department to sign all necessary forms in order for [name] to release information regarding their care.   Consent: Patient/Guardian gives verbal consent for treatment and assignment of benefits for services provided during this visit. Patient/Guardian expressed understanding and agreed to proceed.   Televisit via video: I connected with Briana Williams on 03/19/22 at  3:00 PM EDT by a video enabled telemedicine application and verified that I am speaking with the correct person using two identifiers.  Location: Patient: located in [place] Provider:  remote in [place]   I discussed the limitations of evaluation and management by telemedicine and the availability of in person appointments. The patient expressed understanding and agreed to proceed.  I discussed the assessment and treatment plan with the patient. The patient was provided an opportunity to ask questions and all were answered. The patient agreed with the plan and demonstrated an understanding of the instructions.   The patient was advised to call back or seek an in-person evaluation if the symptoms worsen or if the condition fails to improve as anticipated.  I provided 55 minutes of non-face-to-face time during this encounter. An additional 50 minutes was spent involved in chart review, documentation, and medication management.   Alda Berthold, MD 8/22/20235:04 PM

## 2022-03-19 NOTE — Patient Instructions (Addendum)
Thank you for attending your appointment today.  -- As discussed, START prazosin 1 mg nightly to target nightmares/intrusive images. After 1 week, INCREASE to 2 mg nightly if tolerating well.  -- Please continue other medications as prescribed. -- Here is more information about the grief support group offered through [facility].    [email or web address]  Please do not make any changes to medications without first discussing with your provider. If you are experiencing a psychiatric emergency, please call 911 or present to your nearest emergency department. Additional crisis, medication management, and therapy resources are included below.  [facility]  [address] [place], [place] [address] [phone] or [phone] [facility] 24/7 FOR ANYONE [address] [place], [place] Fax: [phone] [email or web address] *Interpreters available *Accepts all insurance and uninsured for Urgent Care needs *Accepts Medicaid and uninsured for outpatient treatment (below)      ONLY FOR [facility]  Below:    Outpatient New Patient Assessment/Therapy Walk-ins:        Monday -Thursday 8am until slots are full.        Every Friday 1pm-4pm  (first come, first served)                   New Patient Psychiatry/Medication Management        Monday-Friday 8am-11am (first come, first served)               For all walk-ins we ask that you arrive by 7:15am, because patients will be seen in the order of arrival.

## 2022-03-20 ENCOUNTER — Encounter: Payer: Self-pay | Admitting: Internal Medicine

## 2022-03-20 NOTE — Progress Notes (Signed)
Note received from nephrologist Dr. Candiss Norse.  Patient seen 03/11/2022 CKD stage IV patient was euvolemic on exam and does not have any concerning uremic type symptoms.  We will continue to follow.  Follow-up in 4 months HTN, renal disease blood pressure elevated today.  Patient attributed this to situational aggravation from grief reaction Anemia of chronic renal disease: No overt blood loss noted hemoglobin/hematocrit are at goal.  No indication at present for ESA Secondary hyperparathyroidism: Calcium, phosphorus and magnesium levels currently within acceptable range vitamin D stores are currently replete and intact PTH level is at goal for current stage of CKD Labs: Creatinine 2.26, GFR 27 H/H 10.6/32% Intact PTH 91 Vitamin D 25-hydroxy 45 Urine protein 4+

## 2022-03-28 ENCOUNTER — Other Ambulatory Visit: Payer: Self-pay | Admitting: Physician Assistant

## 2022-03-28 DIAGNOSIS — F419 Anxiety disorder, unspecified: Secondary | ICD-10-CM

## 2022-04-07 NOTE — Progress Notes (Deleted)
Cardiology Office Note:    Date:  04/07/2022   ID:  Briana Williams, DOB Dec 06, 1980, MRN [id number]  PCP:  Ladell Pier, MD   [place] Providers Cardiologist:  None { Click to update primary MD,subspecialty MD or APP then REFRESH:1}    Referring MD: Ladell Pier, MD   No chief complaint on file. ***  History of Present Illness:    Briana Williams is a 41 y.o. female with a hx of ***  HTN T2DM CKD stage 4 Anemia in CKD HLD Hypertriglyceridemia Obesity Migraines Hx of syncope Depression Leg edema OSA Kyphoscoliosis Chronic pain syndrome  She presented to the hospital on February 02, 2022 with chief complaint of loss of consciousness, reported having syncopal episodes.  She reported first episode happen after waking up and was suddenly on the ground With nosebleed, second episode happened while walking the dog, was witnessed by family, she reported left arm and leg heaviness, struck head on the left side, reported pain in back an neck, numbness and tingling to forearm and 3 fingers.  No seizure, postictal state, or prodromal state prior to this.  No prior history of syncope.  Chronic left-sided numbness for the past 4 years but felt worse.  Denies chest pain.  Reported bilateral leg swelling for 3 weeks and had occasional sharp chest pain with inspiration.  Reported significant stress as her husband recently passed away.  Cardiology consulted for syncope and 2D echocardiogram revealed normal EF at 65 to 70%, normal RV, grade 1 diastolic dysfunction.  Carotids unremarkable and did not reveal significant stenosis, recommended to arrange monitor at discharge.  This was essentially a normal monitor, no significant arrhythmias noted, very low burden of PVCs and PACs, no syncopal episodes during the monitoring period were reported.  Today she presents for follow-up.     Syncope HTN Leg edema HLD, hypertriglyceridemia Obesity OSA CKD stage 4,  Anemia  in CKD T2DM      Past Medical History:  Diagnosis Date   Anxiety    Arthritis    Depression    GERD (gastroesophageal reflux disease)    Headache    migraine 2 weeks ago   Hyperlipidemia    Hypertension associated with diabetes ([place])    Renal disorder    has both kidneys, compacted and 'squashed together"   Scoliosis    Sleep apnea    in the midst of being tested for it.   waiting on scheduling    Type 2 diabetes mellitus treated with insulin ([place])    dx 2000    Past Surgical History:  Procedure Laterality Date   BACK SURGERY     CERVICAL FUSION     CHOLECYSTECTOMY N/A 12/20/2021   Procedure: LAPAROSCOPIC CHOLECYSTECTOMY;  Surgeon: Clovis Riley, MD;  Location: [facility];  Service: General;  Laterality: N/A;   frontal closed sutures to forehead     Harrington Rod placement     HIP SURGERY     staph infection   [place] N/A 02/25/2017   Procedure: RADIOLOGY WITH ANESTHESIA/MRI THORACIC SPINE AND LEFT SHOULDER WITHOUT CONTRAST.;  Surgeon: Radiologist, Medication, MD;  Location: [facility];  Service: Radiology;  Laterality: N/A;   RADIOLOGY WITH ANESTHESIA N/A 02/04/2022   Procedure: MRI WITH ANESTHESIA;  Surgeon: Radiologist, Medication, MD;  Location: [facility];  Service: Radiology;  Laterality: N/A;    Current Medications: No outpatient medications have been marked as taking for the 04/08/22 encounter (  Appointment) with Deberah Pelton, NP.     Allergies:   Hydrocodone, Lisinopril, Metformin and related, Norvasc [amlodipine besylate], Other, and Ultrasound gel   Social History   Socioeconomic History   Marital status: Widowed    Spouse name: Not on file   Number of children: 3   Years of education: Not on file   Highest education level: Not on file  Occupational History    Employer: UNEMPLOYED  Tobacco Use   Smoking status: Never   Smokeless tobacco: Never  Vaping Use   Vaping Use: Never used  Substance and Sexual Activity    Alcohol use: No   Drug use: No   Sexual activity: Not Currently    Birth control/protection: Other-see comments    Comment: unable to have children d/t scoliosis  Other Topics Concern   Not on file  Social History Narrative   Not on file   Social Determinants of Health   Financial Resource Strain: Low Risk  (12/27/2021)   Overall Financial Resource Strain (CARDIA)    Difficulty of Paying Living Expenses: Not hard at all  Food Insecurity: No Food Insecurity (12/27/2021)   Hunger Vital Sign    Worried About Running Out of Food in the Last Year: Never true    [place] in the Last Year: Never true  Transportation Needs: No Transportation Needs (12/27/2021)   PRAPARE - Hydrologist (Medical): No    Lack of Transportation (Non-Medical): No  Physical Activity: Inactive (12/27/2021)   Exercise Vital Sign    Days of Exercise per Week: 0 days    Minutes of Exercise per Session: 0 min  Stress: Stress Concern Present (12/27/2021)   [place]    Feeling of Stress : Very much  Social Connections: Socially Integrated (12/27/2021)   Social Connection and Isolation Panel [NHANES]    Frequency of Communication with Friends and Family: More than three times a week    Frequency of Social Gatherings with Friends and Family: More than three times a week    Attends Religious Services: More than 4 times per year    Active Member of [employer] or Organizations: Yes    Attends Music therapist: More than 4 times per year    Marital Status: Married     Family History: The patient's ***family history includes CAD in her mother; Cancer in her maternal grandmother; Diabetes in her maternal aunt and mother; Heart failure in her mother; Stroke in her father.  ROS:   Please see the history of present illness.    *** All other systems reviewed and are negative.  EKGs/Labs/Other Studies Reviewed:    The  following studies were reviewed today: ***  EKG:  EKG is *** ordered today.  The ekg ordered today demonstrates ***  Cardiac monitor on March 05, 2022:   Patch Wear Time:  9 days and 3 hours (2023-07-21T10:45:26-0400 to 2023-07-30T14:07:57-0400)   Patient had a min HR of 51 bpm, max HR of 121 bpm, and avg HR of 73 bpm. Predominant underlying rhythm was Sinus Rhythm. Isolated SVEs were rare (<1.0%), SVE Couplets were rare (<1.0%), and no SVE Triplets were present. Isolated VEs were rare (<1.0%), VE  Couplets were rare (<1.0%), and no VE Triplets were present.    Essentially normal monitor. No significant arrhythmias. Very low burden of PAC's and PVC's. No syncopal episodes during the monitoring period were reported.  Bilateral lower extremity  venous Doppler (DVT) on February 04, 2022: BILATERAL:  - No evidence of deep vein thrombosis seen in the lower extremities,  bilaterally.  -No evidence of popliteal cyst, bilaterally.  -Subcutaneous edema, bilaterally (calves). R > L   Bilateral carotid duplex on February 04, 2022: Right Carotid: The extracranial vessels were near-normal with only minimal wall thickening or plaque.   Left Carotid: The extracranial vessels were near-normal with only minimal wall thickening or plaque.   Vertebrals:  Right vertebral artery demonstrates antegrade flow. Left vertebral artery was not visualized.  Subclavians: Normal flow hemodynamics were seen in bilateral subclavian arteries.   2D echocardiogram on February 03, 2022:  1. Left ventricular ejection fraction, by estimation, is 65 to 70%. The  left ventricle has normal function. The left ventricle has no regional  wall motion abnormalities. Left ventricular diastolic parameters are  consistent with Grade I diastolic  dysfunction (impaired relaxation).   2. Right ventricular systolic function is normal. The right ventricular  size is normal.   3. The mitral valve is normal in structure. No evidence of mitral valve   regurgitation. No evidence of mitral stenosis.   4. The aortic valve is normal in structure. Aortic valve regurgitation is  not visualized. No aortic stenosis is present.   5. The inferior vena cava is normal in size with greater than 50%  respiratory variability, suggesting right atrial pressure of 3 mmHg.   Recent Labs: 02/02/2022: B Natriuretic Peptide 65.8; Magnesium 1.7; TSH 2.713 02/03/2022: ALT 17; BUN 39; Creatinine, Ser 2.60; Hemoglobin 10.1; Platelets 161; Potassium 4.2; Sodium 143  Recent Lipid Panel    Component Value Date/Time   CHOL 198 08/11/2020 1632   TRIG 112 08/11/2020 1632   HDL 62 08/11/2020 1632   CHOLHDL 3.2 08/11/2020 1632   CHOLHDL 5.3 (H) 09/21/2015 1740   VLDL 56 (H) 09/21/2015 1740   LDLCALC 116 (H) 08/11/2020 1632     Risk Assessment/Calculations:   {Does this patient have ATRIAL FIBRILLATION?:[phone]}  No BP recorded.  {Refresh Note OR Click here to enter BP  :1}***         Physical Exam:    VS:  There were no vitals taken for this visit.    Wt Readings from Last 3 Encounters:  02/27/22 233 lb (105.7 kg)  02/19/22 233 lb (105.7 kg)  02/04/22 227 lb 1.2 oz (103 kg)     GEN: *** Well nourished, well developed in no acute distress HEENT: Normal NECK: No JVD; No carotid bruits LYMPHATICS: No lymphadenopathy CARDIAC: ***RRR, no murmurs, rubs, gallops RESPIRATORY:  Clear to auscultation without rales, wheezing or rhonchi  ABDOMEN: Soft, non-tender, non-distended MUSCULOSKELETAL:  No edema; No deformity  SKIN: Warm and dry NEUROLOGIC:  Alert and oriented x 3 PSYCHIATRIC:  Normal affect   ASSESSMENT:    No diagnosis found. PLAN:    In order of problems listed above:  ***      {Are you ordering a CV Procedure (e.g. stress test, cath, DCCV, TEE, etc)?   Press F2        :[id number]}    Medication Adjustments/Labs and Tests Ordered: Current medicines are reviewed at length with the patient today.  Concerns regarding medicines are  outlined above.  No orders of the defined types were placed in this encounter.  No orders of the defined types were placed in this encounter.   There are no Patient Instructions on file for this visit.   SignedFinis Bud, NP  04/07/2022 6:45 PM  [place]

## 2022-04-08 ENCOUNTER — Telehealth (HOSPITAL_COMMUNITY): Payer: Medicaid Other | Admitting: Psychiatry

## 2022-04-08 ENCOUNTER — Ambulatory Visit: Payer: Medicaid Other | Attending: General Practice | Admitting: General Practice

## 2022-04-11 ENCOUNTER — Other Ambulatory Visit: Payer: Self-pay | Admitting: Obstetrics and Gynecology

## 2022-04-11 NOTE — Patient Instructions (Signed)
Hi Ms. Lynde, I am sorry I missed you today- as a part of your Medicaid benefit, you are eligible for care management and care coordination services at no cost or copay. I was unable to reach you by phone today but would be happy to help you with your health related needs. Please feel free to call me at [phone].  A member of the Managed Medicaid care management team will reach out to you again over the next 30 business days.   Aida Raider RN, BSN [place]  [facility] - Managed Medicaid High Risk [phone].

## 2022-04-11 NOTE — Patient Outreach (Signed)
Care Coordination  04/11/2022  [name] May 10, 1981 [id number]   Medicaid Managed Care   Unsuccessful Outreach Note  04/11/2022 Name: Briana Williams MRN: [id number] DOB: 08/17/1980  Referred by: Ladell Pier, MD Reason for referral : High Risk Managed Medicaid (Unsuccessful telephone outreach)   An unsuccessful telephone outreach was attempted today. The patient was referred to the case management team for assistance with care management and care coordination.   Follow Up Plan: The care management team will reach out to the patient again over the next 30 business  days.   Aida Raider RN, BSN [place]  [facility] - Managed Medicaid High Risk [phone]

## 2022-05-16 ENCOUNTER — Other Ambulatory Visit: Payer: Self-pay | Admitting: Obstetrics and Gynecology

## 2022-05-16 NOTE — Patient Instructions (Signed)
Hi Ms. Lave, I am sorry we have missed you today - as a part of your Medicaid benefit, you are eligible for care management and care coordination services at no cost or copay. I was unable to reach you by phone today but would be happy to help you with your health related needs. Please feel free to call me at [phone].  A member of the Managed Medicaid care management team will reach out to you again over the next 30 business  days.   Aida Raider RN, BSN [place]  [facility] - Managed Medicaid High Risk [phone]

## 2022-05-16 NOTE — Patient Outreach (Signed)
Care Coordination  05/16/2022  Briana Williams 1980-10-23 [id number]   Medicaid Managed Care   Unsuccessful Outreach Note  05/16/2022 Name: Briana Williams MRN: [id number] DOB: 1981/03/15  Referred by: Ladell Pier, MD Reason for referral : High Risk Managed Medicaid (Unsuccessful telephone outreach)   A second unsuccessful telephone outreach was attempted today. The patient was referred to the case management team for assistance with care management and care coordination.   Follow Up Plan: The care management team will reach out to the patient again over the next 30 business  days.  Aida Raider RN, BSN [place]  [facility] - Managed Medicaid High Risk [phone]

## 2022-05-17 ENCOUNTER — Other Ambulatory Visit: Payer: Self-pay | Admitting: Internal Medicine

## 2022-05-17 ENCOUNTER — Other Ambulatory Visit: Payer: Self-pay | Admitting: Obstetrics and Gynecology

## 2022-05-17 DIAGNOSIS — M62838 Other muscle spasm: Secondary | ICD-10-CM

## 2022-05-17 MED ORDER — TIZANIDINE HCL 4 MG PO TABS: ORAL_TABLET | ORAL | 0 refills | Status: AC

## 2022-05-17 NOTE — Telephone Encounter (Signed)
Copied from [place] [phone]. Topic: General - Other >> May 17, 2022  9:49 AM Everette C wrote: Reason for CRM: Medication Refill - Medication: tiZANidine (ZANAFLEX) 4 MG tablet [[id number]]   Has the patient contacted their pharmacy? Yes.  The patient has been directed to contact their PCP  (Agent: If no, request that the patient contact the pharmacy for the refill. If patient does not wish to contact the pharmacy document the reason why and proceed with request.) (Agent: If yes, when and what did the pharmacy advise?)  Preferred Pharmacy (with phone number or street name): [facility] [place], [place] - [name] [place] [name] [place]  [address] Phone: [phone] Fax: [phone] Hours: Not open 24 hours   Has the patient been seen for an appointment in the last year OR does the patient have an upcoming appointment? Yes.    Agent: Please be advised that RX refills may take up to 3 business days. We ask that you follow-up with your pharmacy.

## 2022-05-17 NOTE — Patient Instructions (Signed)
Hi Briana Williams, I am sorry to have missed you again today, we will keep trying - as a part of your Medicaid benefit, you are eligible for care management and care coordination services at no cost or copay. I was unable to reach you by phone today but would be happy to help you with your health related needs. Please feel free to call me at [phone].  A member of the Managed Medicaid care management team will reach out to you again over the next 30 business days.   Aida Raider RN, BSN [place]  [facility] - Managed Medicaid High Risk [phone]

## 2022-05-17 NOTE — Patient Outreach (Signed)
Care Coordination  05/17/2022  Briana Williams 1981-04-11 [id number]  RNCM returned patient's phone call, no answer, message left.  Aida Raider RN, BSN [place]  [facility] - Managed Medicaid High Risk [phone].

## 2022-05-17 NOTE — Telephone Encounter (Signed)
Requested medication (s) are due for refill today: yes 23 Requested medication (s) are on the active medication list: yes  Last refill:  02/21/2020 330 with 0 RF  Future visit scheduled: 05/23/22, seen 7/25/  Notes to clinic:  This medication can not be delegated, please assess.        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

## 2022-05-20 ENCOUNTER — Telehealth: Payer: Self-pay | Admitting: Internal Medicine

## 2022-05-20 NOTE — Telephone Encounter (Signed)
..  Patient declines further follow up and engagement by the Managed Medicaid Team. Appropriate care team members and provider have been notified via electronic communication. The Managed Medicaid Team is available to follow up with the patient after provider conversation with the patient regarding recommendation for engagement and subsequent re-referral to the Managed Medicaid Team.    Jennifer Alley Care Guide, High Risk Medicaid Managed Care Embedded Care Coordination [place]  [facility]   

## 2022-05-21 ENCOUNTER — Telehealth: Payer: Self-pay | Admitting: *Deleted

## 2022-05-21 NOTE — Telephone Encounter (Signed)
Changed appoointment to virtual as requested per pt.

## 2022-05-21 NOTE — Telephone Encounter (Signed)
Copied from [place] [phone]. Topic: Appointment Scheduling - Scheduling Inquiry for Clinic >> May 21, 2022  2:00 PM Ludger Nutting wrote: Patient is currently out of town and wants to know if her appointment on 10/26 can be changed to a telephone visit. Please follow up with patient.

## 2022-05-23 ENCOUNTER — Ambulatory Visit: Payer: Medicaid Other | Attending: Internal Medicine | Admitting: Internal Medicine

## 2022-05-23 DIAGNOSIS — M62830 Muscle spasm of back: Secondary | ICD-10-CM

## 2022-05-23 DIAGNOSIS — E1169 Type 2 diabetes mellitus with other specified complication: Secondary | ICD-10-CM | POA: Diagnosis not present

## 2022-05-23 DIAGNOSIS — R55 Syncope and collapse: Secondary | ICD-10-CM | POA: Diagnosis not present

## 2022-05-23 MED ORDER — TIZANIDINE HCL 4 MG PO TABS: 4.0000 mg | ORAL_TABLET | Freq: Three times a day (TID) | ORAL | 0 refills | Status: AC | PRN

## 2022-05-23 NOTE — Progress Notes (Signed)
Patient ID: Briana Williams, female   DOB: [birth year], 41 y.o.   MRN: [id number] Virtual Visit via Telephone Note  I connected with [name] on 05/23/2022 at 4:49 PM by telephone and verified that I am speaking with the correct person using two identifiers  Location: Patient: home Provider: office  Participants: Myself Patient   I discussed the limitations, risks, security and privacy concerns of performing an evaluation and management service by telephone and the availability of in person appointments. I also discussed with the patient that there may be a patient responsible charge related to this service. The patient expressed understanding and agreed to proceed.   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  Pt currently in [place]. She has been staying with family friend there for past 2 mths; was having a difficult time staying in the house after her husband died at home this summer.  plans to return to [facility] after Christmas.   Last seen 02/19/2022 post hosp for syncope Syncope of unclear etiology but concern for cardiac cause because of lack of prodromal symptoms.  Seen by cardiologist.  2D echo showed EF 65 to 70% with grade 1 diastolic dysfunction.  Carotids without significant stenosis.  Recommended Zio patch to be placed as an outpatient. Patient did the Zio patch for 2 weeks.  Results:  Essentially normal monitor. No significant arrhythmias. Very low burden of PAC's and PVC's. No syncopal episodes during the monitoring period were reported.  Since being in [place], she has  had 2 episodes of since scope.  The first episode she felt nauseated and a bit dizzy followed by syncope.  Seen in the emergency room.  Told that her blood pressure was low.  Second episode occurred the early part of last month.  Patient states she went to use the bathroom and blacked out hitting her head against the shower.  Hospitalized for 3 to 4 days.  Reports overall assessment was that her blood pressure was low again.  Reports no change in blood pressure medications.  When I last saw her she was on carvedilol 12.5 mg twice a day and hydralazine 10 mg twice a day. She has not had any further syncopal episodes or dizziness.  States that she has learned to go slow with position changes.  No device to check blood pressure.  She plans to purchase a blood pressure device by the end of this week.  In regards to her diabetes, she reports that her blood sugars were good when she was hospitalized.  She does not have her glucometer with her.  We had ordered the Dexcom device on her last visit.  She states the clinical pharmacist has been working on trying to get this approved from her insurance.  Reports compliance with aching her diabetes medications that include Lantus 38 units daily, NovoLog 15 units twice a day with the 2 largest meals and Januvia 25 mg.   She has not been able to see her pain specialist because she is in [place].  So she is not had oxycodone.  She is requesting refill on muscle relaxant tizanidine.  Last prescription that she requested on the 20th of this month, I  gave 30 tablets with instructions to take 1 every 8 hours as needed.  She tells me that she has been having a lot of muscle spasms and she has not had her pain medication.  States that her pain specialist had her taking tizanidine 4 times a day.  Requesting refill be sent to [facility] which can then be transferred to a [facility] in [place].    Outpatient Encounter Medications as of 05/23/2022  Medication Sig   ACCU-CHEK  SOFTCLIX LANCETS lancets 1 each by Other route 3 (three) times daily. ICD 10 E11.9   Accu-Chek Softclix Lancets lancets Use as instructed   acetaminophen (8 HOUR PAIN RELIEVER) 650 MG CR tablet 1 (one) tablet by mouth three times daily, as needed   amitriptyline (ELAVIL) 25 MG tablet TAKE 1 TABLET(25 MG) BY MOUTH AT BEDTIME   aspirin EC 81 MG tablet Take 81 mg by mouth daily. Swallow whole.   atorvastatin (LIPITOR) 20 MG tablet Take 1 tablet (20 mg total) by mouth daily.   Blood Glucose Monitoring Suppl (ACCU-CHEK AVIVA PLUS) w/Device KIT 1 Device by Does not apply route 3 (three) times daily after meals. ICD 10 E11.9   Blood Glucose Monitoring Suppl (ACCU-CHEK GUIDE) w/Device KIT Use as directed   carvedilol (COREG) 12.5 MG tablet Take 1 tablet (12.5 mg total) by mouth 2 (two) times daily with a meal.   Continuous Blood Gluc Receiver (DEXCOM G6 RECEIVER) DEVI 1 Device by Does not apply route daily.   Continuous Blood Gluc Sensor (DEXCOM G6 SENSOR) MISC Use as directed daily.   escitalopram (LEXAPRO) 20 MG tablet TAKE 1 TABLET(20 MG) BY MOUTH DAILY (Patient taking differently: Take 20 mg by mouth at bedtime.)   gabapentin (NEURONTIN) 400 MG capsule Take 1 capsule by mouth three times daily (Patient taking differently: Take 400 mg by mouth 3 (three) times daily.)   glucose blood (ACCU-CHEK AVIVA PLUS) test strip 1 each by Other route 3 (three) times daily. ICD 10 E11.9   glucose blood (ACCU-CHEK GUIDE) test strip Test 3x/day before meals   hydrALAZINE (APRESOLINE) 10 MG tablet Take 1 tablet (10 mg total) by mouth 2 (two) times daily.   hydrOXYzine (ATARAX) 25 MG tablet Take 1-2 tabs PO q6-8 hours for anxiety PRN (Patient not taking: Reported on 03/19/2022)   insulin aspart (NOVOLOG FLEXPEN) 100 UNIT/ML FlexPen INJECT 15 UNITS INTO THE SKIN WITH THE TWO LARGEST MEALS OF THE DAY. (Patient taking differently: Inject 15 Units into the skin See admin instructions. Inject 15 units into the skin with the two  largest meals of the day)   insulin glargine (LANTUS SOLOSTAR) 100 UNIT/ML Solostar Pen Inject 38 Units into the skin daily. (Patient taking differently: Inject 34-38 Units into the skin daily.)   Insulin Pen Needle (B-D ULTRAFINE III SHORT PEN) 31G X 8 MM MISC 1 application by Does not apply route daily.   Lancet Devices (ACCU-CHEK SOFTCLIX) lancets 1 each by Other route 3 (three) times daily. ICD 10 E11.9   naloxone (NARCAN) nasal spray 4 mg/0.1 mL Inhale 1 spray as needed.   norethindrone (MICRONOR) 0.35 MG tablet Take 1 tablet by mouth daily.   ondansetron (ZOFRAN-ODT) 4 MG disintegrating tablet Take 1 tablet (4 mg total) by mouth daily as needed.   Oxycodone HCl 10 MG TABS take one tablet by mouth five times daily (Patient taking differently: Take 10 mg by mouth 5 (five) times daily.)   prazosin (MINIPRESS) 1 MG capsule Take 1 capsule (1 mg total) nightly for 7  days then INCREASE to 2 capsules (2 mg total) nightly thereafter.   tiZANidine (ZANAFLEX) 4 MG tablet TAKE 1 TABLET BY MOUTH EVERY 8 HOURS AS NEEDED   Vitamin D, Ergocalciferol, (DRISDOL) 1.25 MG (50000 UNIT) CAPS capsule Take 1 capsule by mouth once a week (Patient taking differently: Take 50,000 Units by mouth every 7 (seven) days. Thursday)   No facility-administered encounter medications on file as of 05/23/2022.    Observations/Objective: No direct observation done as this was a telephone visit.  Assessment and Plan: 1. Recurrent syncope Advised to stay hydrated and to go slow with position changes. Encouraged her to get a blood pressure monitor so that she can check her blood pressure. When she returns to [facility], we may have to have her see the cardiologist again and neurologist. I forgot to tell her to sign a release to have her hospital records sent to me.  MyChart message sent to patient tonight informing her of this.  2. Type 2 diabetes mellitus with morbid obesity ([place]) Encouraged her to continue being  compliant with taking her diabetic medications.  Try to get diabetic testing supplies if Dexcom approval is being delayed.  3. Muscle spasm of back Advised that I am not comfortable prescribing 120 tablets of tizanidine a month even if it was being done by her pain specialist.  Advised that I would only prescribe 60 to last a month at a time.  Patient expressed understanding. - tiZANidine (ZANAFLEX) 4 MG tablet; Take 1 tablet (4 mg total) by mouth every 8 (eight) hours as needed for muscle spasms.  Dispense: 30 tablet; Refill: 0   Follow Up Instructions: Follow-up with [name] once she returns to [facility].   I discussed the assessment and treatment plan with the patient. The patient was provided an opportunity to ask questions and all were answered. The patient agreed with the plan and demonstrated an understanding of the instructions.   The patient was advised to call back or seek an in-person evaluation if the symptoms worsen or if the condition fails to improve as anticipated.  I  Spent 16 minutes on this telephone encounter  This note has been created with Surveyor, quantity. Any transcriptional errors are unintentional.  Karle Plumber, MD

## 2022-05-27 ENCOUNTER — Other Ambulatory Visit: Payer: Self-pay | Admitting: Internal Medicine

## 2022-05-27 DIAGNOSIS — G4700 Insomnia, unspecified: Secondary | ICD-10-CM

## 2022-05-28 NOTE — Telephone Encounter (Signed)
Requested Prescriptions  Pending Prescriptions Disp Refills  . amitriptyline (ELAVIL) 25 MG tablet [Pharmacy Med Name: AMITRIPTYLINE 25MG  TABLETS] 90 tablet 0    Sig: TAKE 1 TABLET(25 MG) BY MOUTH AT BEDTIME     Psychiatry:  Antidepressants - Heterocyclics (TCAs) Passed - 05/27/2022  6:14 AM      Passed - Completed PHQ-2 or PHQ-9 in the last 360 days      Passed - Valid encounter within last 6 months    Recent Outpatient Visits          5 days ago Recurrent syncope   [place], [name], MD   3 months ago Hospital discharge follow-up   [place], MD   10 months ago Kyphoscoliosis deformity of spine   [place] Karle Plumber B, MD   11 months ago 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

## 2022-06-18 ENCOUNTER — Other Ambulatory Visit: Payer: Medicaid Other | Admitting: Obstetrics and Gynecology

## 2022-06-18 NOTE — Patient Outreach (Signed)
Care Coordination  06/18/2022  Briana Williams 1981/03/28 [id number]  Briana Williams was referred to the [facility] Managed Care High Risk team for assistance with care coordination and care management services. Care coordination/care management services as part of the Medicaid benefit was offered to the patient today. The patient declined assistance offered.  Plan: The Medicaid Managed Care High Risk team is available at any time in the future to assist with care coordination/care management services upon referral.   Aida Raider RN, [place] Management Coordinator - Managed [place] High Risk [phone].

## 2022-07-09 ENCOUNTER — Telehealth (HOSPITAL_BASED_OUTPATIENT_CLINIC_OR_DEPARTMENT_OTHER): Payer: Self-pay | Admitting: *Deleted

## 2022-07-09 NOTE — Telephone Encounter (Signed)
-----   Message from Megan Salon, MD sent at 07/04/2022  5:44 PM EST ----- I hate to even open this can of worms.  This pt had MMG last year where follow up u/s was recommended.  She never did it as was diagnosed with stage IV kidney disease.    At this point she just need diagnostic bilateral mammograms.  Can you call her to see if we can get this scheduled.  I would put in the order.     Thanks.  MSM

## 2022-07-09 NOTE — Telephone Encounter (Signed)
Called pt to see if she would be willing to schedule a diagnostic mammogram in follow up to the abnormal she had last year. Pt states that she is willing to do that but lost her husband in June and moved back to [place]. Pt has not established care with OB/GYN there and would like any recommendations. Advised that I would send her request to provider

## 2022-07-09 NOTE — Telephone Encounter (Signed)
LMOVM for pt to call office 

## 2022-08-25 ENCOUNTER — Other Ambulatory Visit: Payer: Self-pay | Admitting: Internal Medicine

## 2022-08-25 DIAGNOSIS — G4700 Insomnia, unspecified: Secondary | ICD-10-CM

## 2022-08-26 NOTE — Telephone Encounter (Signed)
Requested Prescriptions  Pending Prescriptions Disp Refills   amitriptyline (ELAVIL) 25 MG tablet [Pharmacy Med Name: AMITRIPTYLINE 25MG  TABLETS] 90 tablet 0    Sig: TAKE 1 TABLET(25 MG) BY MOUTH AT BEDTIME     Psychiatry:  Antidepressants - Heterocyclics (TCAs) Passed - 08/25/2022  6:17 AM      Passed - Completed PHQ-2 or PHQ-9 in the last 360 days      Passed - Valid encounter within last 6 months    Recent Outpatient Visits           3 months ago Recurrent syncope   [place], [place]   6 months ago Hospital discharge follow-up   [place] Ladell Pier, MD   1 year ago Kyphoscoliosis deformity of spine   [place] Karle Plumber B, MD   1 year ago 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 Ladell Pier, MD

## 2022-10-03 ENCOUNTER — Encounter: Payer: Self-pay | Admitting: Radiology
